# Patient Record
Sex: Male | Born: 1935 | Race: White | Hispanic: No | Marital: Married | Smoking: Never smoker
Health system: Southern US, Community
[De-identification: ages and names within clinical notes are randomized; demographics above are authoritative.]

## PROBLEM LIST (undated history)

## (undated) DIAGNOSIS — G43909 Migraine, unspecified, not intractable, without status migrainosus: Secondary | ICD-10-CM

## (undated) DIAGNOSIS — E079 Disorder of thyroid, unspecified: Secondary | ICD-10-CM

## (undated) DIAGNOSIS — Z9289 Personal history of other medical treatment: Secondary | ICD-10-CM

## (undated) DIAGNOSIS — E785 Hyperlipidemia, unspecified: Secondary | ICD-10-CM

## (undated) DIAGNOSIS — E119 Type 2 diabetes mellitus without complications: Secondary | ICD-10-CM

## (undated) DIAGNOSIS — I679 Cerebrovascular disease, unspecified: Secondary | ICD-10-CM

## (undated) DIAGNOSIS — I4510 Unspecified right bundle-branch block: Secondary | ICD-10-CM

## (undated) DIAGNOSIS — I2510 Atherosclerotic heart disease of native coronary artery without angina pectoris: Secondary | ICD-10-CM

## (undated) DIAGNOSIS — K922 Gastrointestinal hemorrhage, unspecified: Secondary | ICD-10-CM

## (undated) DIAGNOSIS — D696 Thrombocytopenia, unspecified: Secondary | ICD-10-CM

## (undated) DIAGNOSIS — N529 Male erectile dysfunction, unspecified: Secondary | ICD-10-CM

## (undated) DIAGNOSIS — I10 Essential (primary) hypertension: Secondary | ICD-10-CM

## (undated) DIAGNOSIS — T7840XA Allergy, unspecified, initial encounter: Secondary | ICD-10-CM

## (undated) DIAGNOSIS — M1990 Unspecified osteoarthritis, unspecified site: Secondary | ICD-10-CM

## (undated) DIAGNOSIS — N400 Enlarged prostate without lower urinary tract symptoms: Secondary | ICD-10-CM

## (undated) DIAGNOSIS — R0602 Shortness of breath: Secondary | ICD-10-CM

## (undated) DIAGNOSIS — K219 Gastro-esophageal reflux disease without esophagitis: Secondary | ICD-10-CM

## (undated) DIAGNOSIS — Z87442 Personal history of urinary calculi: Secondary | ICD-10-CM

## (undated) DIAGNOSIS — K5790 Diverticulosis of intestine, part unspecified, without perforation or abscess without bleeding: Secondary | ICD-10-CM

## (undated) HISTORY — DX: Disorder of thyroid, unspecified: E07.9

## (undated) HISTORY — DX: Diverticulosis of intestine, part unspecified, without perforation or abscess without bleeding: K57.90

## (undated) HISTORY — DX: Unspecified right bundle-branch block: I45.10

## (undated) HISTORY — DX: Allergy, unspecified, initial encounter: T78.40XA

## (undated) HISTORY — DX: Cerebrovascular disease, unspecified: I67.9

## (undated) HISTORY — DX: Male erectile dysfunction, unspecified: N52.9

## (undated) HISTORY — DX: Unspecified osteoarthritis, unspecified site: M19.90

## (undated) HISTORY — PX: HERNIA REPAIR: SHX51

## (undated) HISTORY — PX: PARAESOPHAGEAL HERNIA REPAIR: SHX2161

## (undated) HISTORY — DX: Thrombocytopenia, unspecified: D69.6

## (undated) HISTORY — PX: LAPAROSCOPIC NISSEN FUNDOPLICATION: SHX1932

## (undated) HISTORY — DX: Benign prostatic hyperplasia without lower urinary tract symptoms: N40.0

## (undated) HISTORY — DX: Type 2 diabetes mellitus without complications: E11.9

## (undated) HISTORY — DX: Hyperlipidemia, unspecified: E78.5

## (undated) HISTORY — DX: Atherosclerotic heart disease of native coronary artery without angina pectoris: I25.10

## (undated) HISTORY — DX: Migraine, unspecified, not intractable, without status migrainosus: G43.909

## (undated) HISTORY — DX: Gastrointestinal hemorrhage, unspecified: K92.2

## (undated) HISTORY — PX: CARDIAC CATHETERIZATION: SHX172

---

## 1996-06-15 HISTORY — PX: CORONARY ARTERY BYPASS GRAFT: SHX141

## 2001-04-29 ENCOUNTER — Encounter: Payer: Self-pay | Admitting: Orthopaedic Surgery

## 2001-04-29 ENCOUNTER — Ambulatory Visit (HOSPITAL_COMMUNITY): Admission: RE | Admit: 2001-04-29 | Discharge: 2001-04-29 | Payer: Self-pay | Admitting: Orthopaedic Surgery

## 2001-05-05 ENCOUNTER — Other Ambulatory Visit: Admission: RE | Admit: 2001-05-05 | Discharge: 2001-05-05 | Payer: Self-pay | Admitting: Dermatology

## 2001-07-28 ENCOUNTER — Other Ambulatory Visit: Admission: RE | Admit: 2001-07-28 | Discharge: 2001-07-28 | Payer: Self-pay | Admitting: Dermatology

## 2002-08-01 ENCOUNTER — Ambulatory Visit (HOSPITAL_COMMUNITY): Admission: RE | Admit: 2002-08-01 | Discharge: 2002-08-02 | Payer: Self-pay | Admitting: Cardiology

## 2002-09-18 ENCOUNTER — Encounter (HOSPITAL_COMMUNITY): Admission: RE | Admit: 2002-09-18 | Discharge: 2002-10-18 | Payer: Self-pay | Admitting: Cardiology

## 2003-08-06 ENCOUNTER — Ambulatory Visit (HOSPITAL_COMMUNITY): Admission: RE | Admit: 2003-08-06 | Discharge: 2003-08-06 | Payer: Self-pay | Admitting: Neurology

## 2004-03-24 ENCOUNTER — Other Ambulatory Visit: Admission: RE | Admit: 2004-03-24 | Discharge: 2004-03-24 | Payer: Self-pay | Admitting: Dermatology

## 2004-07-10 ENCOUNTER — Ambulatory Visit: Payer: Self-pay | Admitting: Cardiology

## 2004-07-14 ENCOUNTER — Ambulatory Visit: Payer: Self-pay | Admitting: Cardiology

## 2004-07-15 ENCOUNTER — Inpatient Hospital Stay (HOSPITAL_BASED_OUTPATIENT_CLINIC_OR_DEPARTMENT_OTHER): Admission: RE | Admit: 2004-07-15 | Discharge: 2004-07-15 | Payer: Self-pay | Admitting: Cardiology

## 2004-07-18 ENCOUNTER — Inpatient Hospital Stay (HOSPITAL_COMMUNITY): Admission: RE | Admit: 2004-07-18 | Discharge: 2004-07-19 | Payer: Self-pay | Admitting: Cardiology

## 2004-07-18 ENCOUNTER — Ambulatory Visit: Payer: Self-pay | Admitting: Cardiology

## 2004-08-04 ENCOUNTER — Ambulatory Visit: Payer: Self-pay | Admitting: Cardiology

## 2005-05-12 ENCOUNTER — Ambulatory Visit: Payer: Self-pay | Admitting: Cardiology

## 2006-01-08 ENCOUNTER — Ambulatory Visit: Payer: Self-pay | Admitting: Cardiology

## 2006-09-09 ENCOUNTER — Ambulatory Visit: Payer: Self-pay | Admitting: Cardiology

## 2006-09-22 ENCOUNTER — Ambulatory Visit: Payer: Self-pay | Admitting: Cardiology

## 2006-11-01 ENCOUNTER — Ambulatory Visit: Payer: Self-pay | Admitting: Cardiology

## 2007-11-17 ENCOUNTER — Ambulatory Visit: Payer: Self-pay | Admitting: Cardiology

## 2007-11-21 ENCOUNTER — Inpatient Hospital Stay (HOSPITAL_BASED_OUTPATIENT_CLINIC_OR_DEPARTMENT_OTHER): Admission: RE | Admit: 2007-11-21 | Discharge: 2007-11-21 | Payer: Self-pay | Admitting: Cardiology

## 2007-11-21 ENCOUNTER — Ambulatory Visit: Payer: Self-pay | Admitting: Cardiology

## 2008-03-15 DIAGNOSIS — K922 Gastrointestinal hemorrhage, unspecified: Secondary | ICD-10-CM

## 2008-03-15 HISTORY — DX: Gastrointestinal hemorrhage, unspecified: K92.2

## 2008-03-21 ENCOUNTER — Encounter: Payer: Self-pay | Admitting: Gastroenterology

## 2008-03-21 ENCOUNTER — Inpatient Hospital Stay (HOSPITAL_COMMUNITY): Admission: EM | Admit: 2008-03-21 | Discharge: 2008-03-23 | Payer: Self-pay | Admitting: Emergency Medicine

## 2008-03-21 ENCOUNTER — Ambulatory Visit: Payer: Self-pay | Admitting: Gastroenterology

## 2008-03-21 ENCOUNTER — Ambulatory Visit: Payer: Self-pay | Admitting: Cardiology

## 2008-03-28 ENCOUNTER — Inpatient Hospital Stay (HOSPITAL_COMMUNITY): Admission: AD | Admit: 2008-03-28 | Discharge: 2008-03-30 | Payer: Self-pay | Admitting: General Surgery

## 2008-03-28 ENCOUNTER — Ambulatory Visit: Payer: Self-pay | Admitting: Cardiology

## 2008-05-15 ENCOUNTER — Ambulatory Visit: Payer: Self-pay | Admitting: Cardiology

## 2008-06-22 ENCOUNTER — Ambulatory Visit: Payer: Self-pay | Admitting: Gastroenterology

## 2008-08-07 ENCOUNTER — Ambulatory Visit: Payer: Self-pay | Admitting: Gastroenterology

## 2008-08-10 ENCOUNTER — Encounter: Payer: Self-pay | Admitting: Gastroenterology

## 2008-10-23 ENCOUNTER — Encounter (INDEPENDENT_AMBULATORY_CARE_PROVIDER_SITE_OTHER): Payer: Self-pay | Admitting: *Deleted

## 2008-10-23 LAB — CONVERTED CEMR LAB
Albumin: 4.1 g/dL
Bilirubin, Direct: 0.2 mg/dL
Cholesterol: 106 mg/dL
LDL Cholesterol: 58 mg/dL
Total Protein: 7.1 g/dL
Triglycerides: 84 mg/dL

## 2009-01-16 DIAGNOSIS — I10 Essential (primary) hypertension: Secondary | ICD-10-CM | POA: Insufficient documentation

## 2009-01-16 DIAGNOSIS — N400 Enlarged prostate without lower urinary tract symptoms: Secondary | ICD-10-CM | POA: Insufficient documentation

## 2009-01-17 ENCOUNTER — Ambulatory Visit: Payer: Self-pay | Admitting: Gastroenterology

## 2009-04-09 ENCOUNTER — Encounter (INDEPENDENT_AMBULATORY_CARE_PROVIDER_SITE_OTHER): Payer: Self-pay | Admitting: *Deleted

## 2009-04-09 LAB — CONVERTED CEMR LAB
ALT: 30 units/L
Albumin: 4.4 g/dL
BUN: 17 mg/dL
Chloride: 104 meq/L
HCT: 47.3 %
HDL: 40 mg/dL
Hemoglobin: 15.6 g/dL
LDL Cholesterol: 56 mg/dL
Platelets: 104 10*3/uL
Total Protein: 7.7 g/dL
Triglycerides: 99 mg/dL
WBC: 4.6 10*3/uL

## 2009-05-08 ENCOUNTER — Ambulatory Visit: Payer: Self-pay | Admitting: Cardiology

## 2009-05-08 ENCOUNTER — Encounter (INDEPENDENT_AMBULATORY_CARE_PROVIDER_SITE_OTHER): Payer: Self-pay | Admitting: *Deleted

## 2009-05-08 LAB — CONVERTED CEMR LAB
Eosinophils Absolute: 0.2 10*3/uL (ref 0.0–0.7)
Eosinophils Relative: 3 % (ref 0–5)
MCHC: 33.3 g/dL (ref 30.0–36.0)
Monocytes Absolute: 0.7 10*3/uL (ref 0.1–1.0)
Monocytes Relative: 12 % (ref 3–12)
Neutro Abs: 2.3 10*3/uL (ref 1.7–7.7)
Platelets: 112 10*3/uL — ABNORMAL LOW (ref 150–400)
RBC: 5.19 M/uL (ref 4.22–5.81)

## 2009-05-10 ENCOUNTER — Encounter (INDEPENDENT_AMBULATORY_CARE_PROVIDER_SITE_OTHER): Payer: Self-pay | Admitting: *Deleted

## 2009-06-04 ENCOUNTER — Encounter: Payer: Self-pay | Admitting: Cardiology

## 2009-06-04 LAB — CONVERTED CEMR LAB
Basophils Relative: 1 % (ref 0–1)
Eosinophils Absolute: 0.2 10*3/uL (ref 0.0–0.7)
Hemoglobin: 15.5 g/dL (ref 13.0–17.0)
Lymphs Abs: 1.9 10*3/uL (ref 0.7–4.0)
Monocytes Absolute: 0.5 10*3/uL (ref 0.1–1.0)
Monocytes Relative: 11 % (ref 3–12)
RBC: 5.06 M/uL (ref 4.22–5.81)
RDW: 13.2 % (ref 11.5–15.5)
WBC: 4.8 10*3/uL (ref 4.0–10.5)

## 2009-06-06 ENCOUNTER — Telehealth: Payer: Self-pay | Admitting: Cardiology

## 2009-09-04 ENCOUNTER — Encounter: Payer: Self-pay | Admitting: Cardiology

## 2010-01-06 ENCOUNTER — Encounter (INDEPENDENT_AMBULATORY_CARE_PROVIDER_SITE_OTHER): Payer: Self-pay | Admitting: *Deleted

## 2010-01-06 LAB — CONVERTED CEMR LAB
ALT: 30 units/L
Albumin: 4.4 g/dL
Bilirubin, Direct: 0.3 mg/dL
HDL: 36 mg/dL
Total Protein: 7.3 g/dL
Triglycerides: 91 mg/dL

## 2010-04-07 ENCOUNTER — Encounter: Payer: Self-pay | Admitting: Orthopedic Surgery

## 2010-04-07 ENCOUNTER — Ambulatory Visit (HOSPITAL_COMMUNITY): Admission: RE | Admit: 2010-04-07 | Discharge: 2010-04-07 | Payer: Self-pay | Admitting: Family Medicine

## 2010-04-29 ENCOUNTER — Encounter (INDEPENDENT_AMBULATORY_CARE_PROVIDER_SITE_OTHER): Payer: Self-pay | Admitting: *Deleted

## 2010-04-30 ENCOUNTER — Ambulatory Visit: Payer: Self-pay | Admitting: Cardiology

## 2010-05-13 ENCOUNTER — Ambulatory Visit: Payer: Self-pay | Admitting: Orthopedic Surgery

## 2010-05-13 DIAGNOSIS — M629 Disorder of muscle, unspecified: Secondary | ICD-10-CM | POA: Insufficient documentation

## 2010-05-13 DIAGNOSIS — S83289A Other tear of lateral meniscus, current injury, unspecified knee, initial encounter: Secondary | ICD-10-CM | POA: Insufficient documentation

## 2010-05-14 ENCOUNTER — Encounter: Payer: Self-pay | Admitting: Orthopedic Surgery

## 2010-05-20 ENCOUNTER — Ambulatory Visit (HOSPITAL_COMMUNITY)
Admission: RE | Admit: 2010-05-20 | Discharge: 2010-05-20 | Payer: Self-pay | Source: Home / Self Care | Admitting: Orthopedic Surgery

## 2010-05-27 ENCOUNTER — Ambulatory Visit: Payer: Self-pay | Admitting: Orthopedic Surgery

## 2010-05-30 ENCOUNTER — Encounter: Payer: Self-pay | Admitting: Cardiology

## 2010-07-15 NOTE — Miscellaneous (Signed)
Summary: ntg refill  Clinical Lists Changes  Medications: Added new medication of NITROSTAT 0.4 MG SUBL (NITROGLYCERIN) 1 tablet under tongue at onset of chest pain; you may repeat every 5 minutes for up to 3 doses. - Signed Rx of NITROSTAT 0.4 MG SUBL (NITROGLYCERIN) 1 tablet under tongue at onset of chest pain; you may repeat every 5 minutes for up to 3 doses.;  #25 x 3;  Signed;  Entered by: Teressa Lower RN;  Authorized by: Kathlen Brunswick, MD, [facility];  Method used: Electronically to [facility] [address]*, [address] [place], [place], [place]  [address], Ph: [phone], Fax: [phone]    Prescriptions: NITROSTAT 0.4 MG SUBL (NITROGLYCERIN) 1 tablet under tongue at onset of chest pain; you may repeat every 5 minutes for up to 3 doses.  #25 x 3   Entered by:   Teressa Lower RN   Authorized by:   Kathlen Brunswick, MD, [facility]   Signed by:   Teressa Lower RN on 09/04/2009   Method used:   Electronically to        [facility]  [address] (retail)       [address]       [place], [place]  [address]       Ph: [phone]       Fax: [phone]   RxID:   [id number]

## 2010-07-15 NOTE — Miscellaneous (Signed)
Summary: labs lipids,liver,01/06/2010  Clinical Lists Changes  Observations: Added new observation of ALBUMIN: 4.4 g/dL (16/03/9603 5:40) Added new observation of PROTEIN, TOT: 7.3 g/dL (98/04/9146 8:29) Added new observation of SGPT (ALT): 30 units/L (01/06/2010 9:54) Added new observation of SGOT (AST): 25 units/L (01/06/2010 9:54) Added new observation of ALK PHOS: 57 units/L (01/06/2010 9:54) Added new observation of BILI DIRECT: 0.3 mg/dL (56/21/3086 5:78) Added new observation of LDL: 50 mg/dL (46/96/2952 8:41) Added new observation of HDL: 36 mg/dL (32/44/0102 7:25) Added new observation of TRIGLYC TOT: 91 mg/dL (36/64/4034 7:42) Added new observation of CHOLESTEROL: 104 mg/dL (59/56/3875 6:43)

## 2010-07-15 NOTE — Assessment & Plan Note (Signed)
Summary: 1 yr f/u per checkout on 05/08/09/tg  Medications Added NIASPAN 500 MG CR-TABS (NIACIN (ANTIHYPERLIPIDEMIC)) 500 am 1000 pm OMEGA 3 340 MG CPDR (OMEGA-3 FATTY ACIDS) take 1 tab daily LABETALOL HCL 200 MG TABS (LABETALOL HCL) Take one tablet by mouth twice a day      Allergies Added: ! * ZITHROMYCIN  Visit Type:  Follow-up Primary Provider:  Dr. Oval Linsey   History of Present Illness: [name] returns to the office as scheduled for continued assessment and treatment of coronary disease and cardiovascular risk factors.  He discontinued clopidogrel at my suggestion a year ago, and has had no subsequent apparent adverse consequences.  Control of blood pressure and hyperlipidemia have been excellent.  He walks daily and performs household tasks without any cardiopulmonary symptoms.  He has noted recent weight loss without anorexia and without an effort to limit caloric intake.  Current Medications (verified): 1)  Niaspan 500 Mg Cr-Tabs (Niacin (Antihyperlipidemic)) .... 500 Am 1000 Pm 2)  Uroxatral 10 Mg Xr24h-Tab (Alfuzosin Hcl) .... Once Daily 3)  Lipitor 20 Mg Tabs (Atorvastatin Calcium) .... At Bedtime 4)  Omega 3 340 Mg Cpdr (Omega-3 Fatty Acids) .... Take 1 Tab Daily 5)  Aspirin 81 Mg Tabs (Aspirin) .... Once Daily 6)  Omeprazole 20 Mg Cpdr (Omeprazole) .... One By Mouth Bid 7)  Nitrostat 0.4 Mg Subl (Nitroglycerin) .Marland Kitchen.. 1 Tablet Under Tongue At Onset of Chest Pain; You May Repeat Every 5 Minutes For Up To 3 Doses. 8)  Labetalol Hcl 200 Mg Tabs (Labetalol Hcl) .... Take One Tablet By Mouth Twice A Day  Allergies (verified): 1)  ! * Emycin 2)  ! Biaxin 3)  ! Codeine 4)  ! * Zithromycin  Comments:  Nurse/Medical Assistant: reviewed meds from previous ov with patient he has stopped the multi vit  and is taking omega 3,6,9 daily the only one in the computer was omega 3   Past History:  PMH, FH, and Social History reviewed and updated.  Review of  Systems  The patient denies weight loss, weight gain, chest pain, syncope, dyspnea on exertion, peripheral edema, prolonged cough, headaches, and abdominal pain.    Vital Signs:  Patient profile:   75 year old male Weight:      199 pounds BMI:     27.09 O2 Sat:      92 % on Room air Pulse rate:   62 / minute BP sitting:   128 / 75  (right arm)  Vitals Entered By: Dreama Saa, CNA (April 30, 2010 1:35 PM)  O2 Flow:  Room air  Physical Exam  General:  Proportionate weight and height; well developed; no acute distress; Weight-199, 8 pounds less than one year ago Neck-No JVD; no carotid bruits: Lungs-No tachypnea, no rales; no rhonchi; no wheezes: Cardiovascular-normal PMI; normal S1 and S2; minimal basilar systolic murmur Abdomen-BS normal; soft and non-tender without masses or organomegaly:  Musculoskeletal-No deformities, no cyanosis or clubbing: Neurologic-Normal cranial nerves; symmetric strength and tone:  Skin-Warm, no significant lesions: Extremities-Nl distal pulses; no edema:     Impression & Recommendations:  Problem # 1:  ATHEROSCLEROTIC CARDIOVASCULAR DISEASES/P- CABG SURGERY-1998 (ICD-429.2) Patient remains active with no symptoms to suggest recurrent myocardial ischemia or progression of coronary disease.  We discussed the advisability of a routine stress test and decided against proceeding with such a study.  Problem # 2:  HYPERLIPIDEMIA (ICD-272.4) Control of hyperlipidemia is excellent with current multidrug regimen, which will be continued.  His updated medication  list for this problem includes:    Niaspan 500 Mg Cr-tabs (Niacin (antihyperlipidemic)) .Marland KitchenMarland KitchenMarland KitchenMarland Kitchen 500 am 1000 pm    Lipitor 20 Mg Tabs (Atorvastatin calcium) .Marland Kitchen... At bedtime    Omega fatty acid one capsule b.i.d.  CHOL: 104 (01/06/2010)   LDL: 50 (01/06/2010)   HDL: 36 (01/06/2010)   TG: 91 (01/06/2010)  Problem # 3:  HYPERTENSION (ICD-401.9) Blood pressure control is superb with a fairly  modest medical regime.  Patient has excellent insurance that pays all but $3 for most of his prescriptions; however, a copayment of $25 per month for Bystolic has been assessed.  I suggested substituting labetalol 200 mg b.i.d. to reduce his cost with no sacrifice in efficacy.  He would like to discuss this issue with Dr. Janna Arch before accepting my suggestion.   BP today: 128/75 Prior BP: 119/68 (05/08/2009)  Labs Reviewed: K+: 4.2 (04/09/2009) Creat: : 0.93 (04/09/2009)   Chol: 104 (01/06/2010)   HDL: 36 (01/06/2010)   LDL: 50 (01/06/2010)   TG: 91 (01/06/2010)  Problem # 4:  THROMBOCYTOPENIA (ICD-287.5) Repeat CBC last year did in fact verify the presence of mild thrombocytopenia.  Mild idiopathic thrombocytopenic purpura and perhaps unrecognized liver disease are the most likely diagnoses.  While he probably does not require therapy for this problem, a diagnostic workup by Dr. Mariel Sleet would be reassuring.  Patient Instructions: 1)  Your physician recommends that you schedule a follow-up appointment in: 1 year 2)  Your physician has recommended you make the following change in your medication: stop bystolic and begin labetalol 200mg  two times a day Prescriptions: LABETALOL HCL 200 MG TABS (LABETALOL HCL) Take one tablet by mouth twice a day  #180 x 3   Entered by:   Teressa Lower RN   Authorized by:   Kathlen Brunswick, MD, [facility]   Signed by:   Teressa Lower RN on 04/30/2010   Method used:   Electronically to        [facility]* (mail-order)             , [place]         Ph: [phone]       Fax: [phone]   RxID:   [id number]

## 2010-07-15 NOTE — Assessment & Plan Note (Signed)
Summary: EVAL/TREAT LT KNEE/XR'S [facility] 04/07/10/REF [place]/MEDICARE,TR...   Visit Type:  Initial   Referring Provider:  Dr. Janna Arch Primary Provider:  Dr. Oval Linsey  CC:  LEFT knee pain x9 months.  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  The pain is constant, rated 8/10 came on gradually without history of trauma but is associated with giving way locking pain with twisting.  Standing makes it worse.  Review of systems unexpected weight loss double vision or eye pain redness.  No chest pain or shortness of breath.  History of blood in the stool heartburn, nausea, vomiting, constipation, diarrhea.  Joint pain stiffness and muscle pain  Unsteady gait and dizziness  Denies genitourinary, skin, psychiatric, endocrine ALLERGY or hematologic problems  Current medications include aspirin, BYSTOLIC, Lipitor, omeprazole, Omega 36 and 9, Niaspan, UROXATRAL.  Family history of heart disease arthritis lung disease and cancer  Social history married, retired, X. 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  ALLERGIES to codeine which causes nausea, erythromycin, azithromycin and Biaxin.  Allergies: 1)  ! * Emycin 2)  ! Biaxin 3)  ! Codeine 4)  ! * Zithromycin  Physical Exam  Additional Exam:  GEN: well developed, well nourished, normal grooming and hygiene, no deformity and normal body habitus.   CDV: pulses are normal, no edema, no erythema. no tenderness  Lymph: normal lymph nodes   Skin: no rashes, skin lesions or open sores   NEURO: normal coordination, reflexes, sensation.   Psyche: awake, alert and oriented. Mood normal   Gait: normal  Inspection there is no swelling in the knee, there was lateral joint line tenderness and tenderness over the iliotibial band which was  also swollen distally near the knee.  There was also normal range of motion good motor function and no instability   The upper extremities have normal appearance, ROM, strength and stability.       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 The report and the films have been reviewed.  his knees look really good especially for 75 years old almost no arthritic change  Probable lateral meniscal tear with a differential diagnosis of iliotibial band friction syndrome recommend MRIup with result  Orders: New Patient Level III (96295)  Patient Instructions: 1)  MRI left knee return with results  2)  in the meantime apply ice on the knee where its hurting 20 min 2 x a day    Orders Added: 1)  New Patient Level III [28413]

## 2010-07-15 NOTE — Letter (Signed)
Summary: History form  History form   Imported By: Jacklynn Ganong 05/15/2010 08:48:57  _____________________________________________________________________  External Attachment:    Type:   Image     Comment:   External Document

## 2010-07-17 NOTE — Miscellaneous (Signed)
  Clinical Lists Changes  Medications: Changed medication from LABETALOL HCL 200 MG TABS (LABETALOL HCL) Take one tablet by mouth twice a day to LABETALOL HCL 100 MG TABS (LABETALOL HCL) take 2 tablets by mouth two times a day - Signed Rx of LABETALOL HCL 100 MG TABS (LABETALOL HCL) take 2 tablets by mouth two times a day;  #360 x 3;  Signed;  Entered by: Teressa Lower RN;  Authorized by: Kathlen Brunswick, MD, [facility];  Method used: Faxed to [facility], , , [place]  , Ph: [phone], Fax: [phone]    Prescriptions: LABETALOL HCL 100 MG TABS (LABETALOL HCL) take 2 tablets by mouth two times a day  #360 x 3   Entered by:   Teressa Lower RN   Authorized by:   Kathlen Brunswick, MD, [facility]   Signed by:   Teressa Lower RN on 05/30/2010   Method used:   Faxed to ...       [facility] (mail-order)             , [place]         Ph: [phone]       Fax: [phone]   RxID:   [id number]

## 2010-07-17 NOTE — Assessment & Plan Note (Signed)
Summary: MRI results fron [facility]/fr[facility]   Referring Onesty Clair:  Dr. Janna Arch Primary Kerryn Tennant:  Dr. 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  The pain is constant, rated 8/10 came on gradually without history of trauma but is associated with giving way locking pain with twisting.  Standing makes it worse.  Pain is worse today has pain with walking, pain in the am.  No injections in the left knee.  Percocet makes him sick.  Dr. Janna Arch is going to order MRI of the L spine.  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  Negative for meniscal tear. 2.  Mild patellofemoral and medial compartment degenerative disease. 3.  Small Baker's cyst.     Allergies: 1)  ! * Emycin 2)  ! Biaxin 3)  ! Codeine 4)  ! * Zithromycin  Review of Systems       Back pain history of back surgery. 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  His pain is lateral, and we we evaluated that with exam and history and he has pain over the iliotibial band as it crosses the joint.  He agreed to injection, LEFT knee iliotibial band. Verbal consent was obtained. The knee was prepped with alcohol and ethyl chloride. 1 cc of depomedrol 40mg /cc and 4 cc of lidocaine 1% was injected. there were no complications.  Orders: Est. Patient Level III (29528) Joint Aspirate / Injection, Large (20610) Depo- Medrol 40mg  (J1030)  Patient Instructions: 1)  You have  received an injection of cortisone today. You may experience increased pain at the injection site. Apply ice pack to the area for 20 minutes every 2 hours and take 2 xtra strength tylenol every 8 hours. This increased pain will usually resolve in 24 hours. The injection will take effect in 3-10 days.  2)  I would give it 3 weeks before we say it doesnt work    Orders Added: 1)  Est. Patient Level III [41324] 2)  Joint Aspirate / Injection, Large [20610] 3)  Depo- Medrol 40mg  [J1030]

## 2010-08-01 ENCOUNTER — Other Ambulatory Visit: Payer: Self-pay | Admitting: General Surgery

## 2010-08-01 ENCOUNTER — Encounter (HOSPITAL_COMMUNITY): Payer: Medicare Other

## 2010-08-01 DIAGNOSIS — Z01810 Encounter for preprocedural cardiovascular examination: Secondary | ICD-10-CM | POA: Insufficient documentation

## 2010-08-01 DIAGNOSIS — Z01812 Encounter for preprocedural laboratory examination: Secondary | ICD-10-CM | POA: Insufficient documentation

## 2010-08-01 LAB — SURGICAL PCR SCREEN: Staphylococcus aureus: POSITIVE — AB

## 2010-08-01 LAB — BASIC METABOLIC PANEL
CO2: 27 mEq/L (ref 19–32)
Calcium: 9.3 mg/dL (ref 8.4–10.5)
GFR calc Af Amer: >60 mL/min (ref >60)

## 2010-08-01 LAB — CBC
HCT: 44.1 % (ref 39.0–52.0)
Hemoglobin: 14.3 g/dL (ref 13.0–17.0)
MCHC: 32.4 g/dL (ref 30.0–36.0)

## 2010-08-06 ENCOUNTER — Ambulatory Visit (HOSPITAL_COMMUNITY)
Admission: RE | Admit: 2010-08-06 | Discharge: 2010-08-06 | Disposition: A | Discharge status: Home / Self Care | Payer: Medicare Other | Source: Ambulatory Visit | Attending: General Surgery | Admitting: General Surgery

## 2010-08-06 DIAGNOSIS — K4090 Unilateral inguinal hernia, without obstruction or gangrene, not specified as recurrent: Secondary | ICD-10-CM | POA: Insufficient documentation

## 2010-08-06 DIAGNOSIS — Z951 Presence of aortocoronary bypass graft: Secondary | ICD-10-CM | POA: Insufficient documentation

## 2010-08-06 DIAGNOSIS — Z7982 Long term (current) use of aspirin: Secondary | ICD-10-CM | POA: Insufficient documentation

## 2010-08-06 DIAGNOSIS — Z79899 Other long term (current) drug therapy: Secondary | ICD-10-CM | POA: Insufficient documentation

## 2010-08-06 NOTE — H&P (Signed)
  NAME:  TAVON, MAGNUSSEN NO.:  [id number]  MEDICAL RECORD NO.:  [id number]           PATIENT TYPE:  LOCATION:                                 FACILITY:  PHYSICIAN:  Dalia Heading, M.D.  DATE OF BIRTH:  [birth year]  DATE OF ADMISSION: DATE OF DISCHARGE:  LH                             HISTORY & PHYSICAL   CHIEF COMPLAINT:  Right inguinal hernia.  HISTORY OF PRESENT ILLNESS:  The patient is a 75 year old white male who presents with right inguinal hernia.  He has had it for many years, but it has recently increased in size and is causing him discomfort.  PAST MEDICAL HISTORY:  Hypertension, idiopathic thrombocytopenia, high cholesterol levels, and reflux disease.  PAST SURGICAL HISTORY:  Laparoscopic fundoplication with repair of diaphragmatic hernia, CABG, and stent placement.  CURRENT MEDICATIONS: 1. Bystolic. 2. Lipitor. 3. Niaspan. 4. Omeprazole. 5. Uroxatral.  ALLERGIES:  CODEINE, ZITHROMAX.  REVIEW OF SYSTEMS:  The patient denies drinking or smoking.  Denies any other cardiopulmonary difficulties or bleeding disorders.  FAMILY MEDICAL HISTORY:  Noncontributory.  PHYSICAL EXAMINATION:  GENERAL:  The patient is a well-developed, well- nourished white male in no acute distress. HEENT:  Unremarkable. LUNGS:  Clear to auscultation with equal breath sounds bilaterally. HEART:  Regular rate and rhythm without S3, S4, or murmurs. ABDOMEN:  Soft, nontender, and nondistended.  He has bilateral inguinal hernias. GENITOURINARY:  Within normal limits.  IMPRESSION:  Symptomatic right inguinal hernia.  PLAN:  The patient is scheduled for right inguinal herniorrhaphy on August 06, 2010.  Risks and benefits of the procedure including bleeding, infection, pain, and the possibility of recurrence of the hernia were fully explained to the patient, he gave informed consent. He is to stop his baby aspirin.  A platelet count will be checked in preop  period.     Dalia Heading, M.D.     MAJ/MEDQ  D:  07/31/2010  T:  08/01/2010  Job:  [id number]  cc:   Delbert Harness, MD  Electronically Signed by Franky Macho M.D. on 08/06/2010 07:32:43 AM

## 2010-08-10 NOTE — Op Note (Signed)
  NAME:  Jorge Bishop, Jorge Bishop NO.:  [id number]  MEDICAL RECORD NO.:  [id number]           PATIENT TYPE:  O  LOCATION:  [facility]                          FACILITY:  [facility]  PHYSICIAN:  Dalia Heading, M.D.  DATE OF BIRTH:  [birth year]  DATE OF PROCEDURE:  08/06/2010 DATE OF DISCHARGE:                              OPERATIVE REPORT   PREOPERATIVE DIAGNOSIS:  Right inguinal hernia.  POSTOPERATIVE DIAGNOSIS:  Right inguinal hernia.  PROCEDURE:  Right inguinal herniorrhaphy.  SURGEON:  Dalia Heading, M.D.  ANESTHESIA:  Spinal.  INDICATIONS:  The patient is a 75 year old white male who presents with a symptomatic right inguinal hernia.  The risks and benefits of the procedure including bleeding, infection, pain, and the possibly of recurrence of the hernia were fully explained to the patient, he gave informed consent.  PROCEDURE NOTE:  The patient was placed in the supine position after spinal anesthesia was administered.  The right groin region was prepped and draped using the usual sterile technique, with Betadine.  Surgical site confirmation was performed.  A transverse incision was made over the right inguinal region down to the external oblique aponeurosis.  The aponeurosis was incised to the external ring.  A Penrose drain was placed around the spermatic cord. The vase deferens was noted within the spermatic cord.  The ilioinguinal nerve was identified and kept superiorly from the operative field.  The patient had a direct hernia.  This was incised at its base and inverted. A large-size Bard PerFix plug was then placed into this region.  An onlay patch was then placed along the floor in the inguinal canal and secured superiorly to the conjoined tendon and inferiorly to the shelving edge of Poupart's ligament using 2-0 Novafil interrupted sutures.  The internal ring was re-created using a 2-0 Novafil interrupted suture.  An On-Q pain pump catheter was then  inserted percutaneously underneath the external oblique aponeurosis.  The aponeurosis was closed using a 2-0 Vicryl running suture.  The subcutaneous layer was reapproximated using a 3-0 Vicryl interrupted suture.  The skin was closed using 4-0 Vicryl subcuticular suture. Sensorcaine 0.5% was instilled into the surrounding wound.  Dermabond was then applied.  All tape and needle counts were correct at the end of the procedure. The patient was transferred to the PACU in stable condition.  COMPLICATIONS:  None.  SPECIMEN:  None.  ESTIMATED BLOOD LOSS:  Minimal.     Dalia Heading, M.D.     MAJ/MEDQ  D:  08/06/2010  T:  08/06/2010  Job:  [id number]  cc:   Delbert Harness, MD  Electronically Signed by Franky Macho M.D. on 08/10/2010 10:00:53 AM

## 2010-08-15 ENCOUNTER — Encounter: Payer: Self-pay | Admitting: Orthopedic Surgery

## 2010-09-01 ENCOUNTER — Encounter: Payer: Self-pay | Admitting: Orthopedic Surgery

## 2010-09-02 ENCOUNTER — Ambulatory Visit (INDEPENDENT_AMBULATORY_CARE_PROVIDER_SITE_OTHER): Payer: Medicare Other | Admitting: Orthopedic Surgery

## 2010-09-02 ENCOUNTER — Ambulatory Visit: Payer: Self-pay | Admitting: Orthopedic Surgery

## 2010-09-02 DIAGNOSIS — IMO0002 Reserved for concepts with insufficient information to code with codable children: Secondary | ICD-10-CM

## 2010-09-02 DIAGNOSIS — M1710 Unilateral primary osteoarthritis, unspecified knee: Secondary | ICD-10-CM

## 2010-09-02 DIAGNOSIS — M25569 Pain in unspecified knee: Secondary | ICD-10-CM

## 2010-09-02 DIAGNOSIS — M25562 Pain in left knee: Secondary | ICD-10-CM

## 2010-09-02 DIAGNOSIS — M179 Osteoarthritis of knee, unspecified: Secondary | ICD-10-CM

## 2010-09-02 NOTE — Discharge Summary (Signed)
Injection LEFT knee.  Consent was obtained.  LEFT knee was injected with Depo-Medrol 40 mg plus lidocaine 1% 4 cc.  Knee was prepped with alcohol and anesthetized with ethyl chloride.  The injection was tolerated without complication.

## 2010-09-02 NOTE — Patient Instructions (Signed)
Apply ice as needed   If you have pain from the injection, apply ice and take 1000 mg tylenol three times a day   Should resolve in 24 hrs

## 2010-09-02 NOTE — Progress Notes (Signed)
General: The patient is normally developed, with normal grooming and hygiene. There are no gross deformities. The body habitus is normal   CDV: The pulse and perfusion of the extremities are normal   Skin: There are no rashes, ulcers or cafe-au-lait spot   Psyche: The patient is alert, awake and oriented.  Mood is normal   The coordination and balance are normal.  Sensation is normal.  Musculoskeletal   Left knee exam   No joint effusion   Range of motion is 110 degrees   The knee is stable

## 2010-10-28 NOTE — Assessment & Plan Note (Signed)
NAME:  Jorge Bishop, STROSCHEIN                  CHART#:  [id number]   DATE:  06/22/2008                       DOB:  18-Jun-1935   REFERRING Carrington Mullenax:  Melvyn Novas, MD   PROBLEM LIST:  1. Paraesophageal hernia repaired in October of 2009.  2. Helicobacter pylori gastritis in October of 2009.  3. Coronary artery disease.  4. Right bundle branch block.  5. Hypertension.  6. Benign prostatic hypertrophy.  7. Hyperlipidemia.  8. History of laparoscopic Nissen fundoplication in 1997 and revision      in 2009.  9. CABG (coronary artery bypass graft) in 1998.  10.Multiple coronary artery stents.   SUBJECTIVE:  Jorge Bishop is a 75 year old male who presents as a return  patient visit.  He was last seen as an inpatient.  He had a  paraesophageal hernia removed and a Nissen fundoplication performed.  He  was treated initially with his Helicobacter pylori gastritis using  Biaxin.  It caused his throat to swell and he was switched to  amoxicillin and Flagyl.  He now complains of a constant full feeling  which he described as indigestion.  Since the surgery he complains of  constipation.  His bowel movements are hard.  He is taking his  omeprazole twice a day.  He denies eating any red meat and his diet is  salt free.  He does eat a lot of fruit.  He drinks very little water and  few sodas.  He does eat fruit.  He still can feel food going down.  He  denies any nausea, vomiting, black stool or blood in the stool.   MEDICATIONS:  1. Niaspan.  2. Uroxatral.  3. Bystolic.  4. Lipitor.  5. Omega 3 b.i.d.  6. Aspirin.  7. Multivitamin.  8. Omeprazole twice a day.   OBJECTIVE:  VITAL SIGNS:  Weight 213 pounds (up 8 pounds since October  of 2009), height 6 feet, temperature 97.9, blood pressure 110/70, pulse  56.  GENERAL:  He is in no apparent distress.  Alert and oriented x4.  HEENT:  Is atraumatic.  Pupils are equal and reactive to light.  Mouth -  no oral lesions.  Posterior pharynx  without erythema or exudate.  NECK:  Full range of motion.  No lymphadenopathy.  LUNGS:  Clear to  auscultation bilaterally.  CARDIOVASCULAR:  Regular rhythm.  No murmur.  Normal S1 and S2.ABDOMEN:  Bowel sounds are present.  Soft, nontender,  nondistended.  No rebound or guarding.  EXTREMITIES:  No cyanosis or  edema.   ASSESSMENT:  Jorge Bishop is a 75 year old male with a feeling of fullness  that is associated with constipation.  The differential diagnosis  includes small bowel bacterial overgrowth or gas bloat syndrome  following Nissen fundoplication due to delayed gastric emptying.  Thank  you for allowing me to see Mr. Dearmas in consultation.  My  recommendations follow.   RECOMMENDATIONS:  1. Mr. Ardolino was given a handout on the management of his constipation      in writing.  He is asked to drink 6-8 cups of water daily.  2. He should follow a high-fiber diet.  He is warned that items may      cause bloating and if so avoid them.  3. He is to add FiberSure or an equivalent two  times a day to achieve      one bowel movement every 2-3 days.  4. He is to add Milk of Magnesia or MiraLax once or twice a day to his      regimen if he is not having a satisfactory bowel movement.  5. He will follow up in 6 weeks.  He declined a prescription for      Amitiza at this time.  If his bloating persists then could consider      an Helicobacter pylori antigen test, hydrogen breath test or a      gastric emptying study.       Kassie Mends, M.D.  Electronically Signed     SM/MEDQ  D:  06/22/2008  T:  06/22/2008  Job:  [id number]   cc:   Melvyn Novas, MD

## 2010-10-28 NOTE — Consult Note (Signed)
NAME:  RAMARI, BRAY NO.:  [id number]   MEDICAL RECORD NO.:  [id number]          PATIENT TYPE:  INP   LOCATION:  A325                          FACILITY:  [facility]   PHYSICIAN:  Kassie Mends, M.D.      DATE OF BIRTH:  28-Dec-1935   DATE OF CONSULTATION:  03/21/2008  DATE OF DISCHARGE:                                 CONSULTATION   REASON FOR CONSULTATION:  GI bleed.   REQUESTING PHYSICIAN:  Melvyn Novas, M.D.   HISTORY OF PRESENT ILLNESS:  Patient is a 75 year old Caucasian  gentleman with acute onset of black, tarry stools which began yesterday  morning.  He had a total of six episodes.  After several black stools,  he then developed nausea and vomiting.  He describes coffee-ground  emesis.  He denies any abdominal pain.  He states that he passed out at  home.  He was evaluated in the emergency department.  He was noted to  have a hemoglobin of 12.2.  His INR was 1.1.  His BUN 63, creatinine  0.92.  White count 8600.  This morning, his hemoglobin is stable at  12.3.  White count is 12,100.   He denies any heartburn, dysphagia, odynophagia, constipation, or  diarrhea.  He had anti-reflux surgery in the remote past.  He had a  flexible sigmoidoscopy back in 1995 but no prior complete colonoscopy.  He did have early diverticulosis.   MEDICATIONS:  1. Lipitor 20 mg daily.  2. Nitrostat 0.4 mg p.r.n.  3. Uroxatral 10 mg daily.  4. Bystolic 5 mg daily.  5. Aspirin 325 mg daily.  6. Plavix 75 mg daily.  He has been on this since 1998.  7. Isosorbide mononitrate 30 mg daily.   He denies any other NSAIDs use.   ALLERGIES:  CODEINE, ERYTHROMYCIN.   PAST MEDICAL HISTORY:  1. CAD, status post CABG in 1998, followed by stents in 2004 and 2006.  2. Hyperlipidemia.  3. Degenerative joint disease.  4. History of migraines.  5. History of vertigo.  6. Chronic right bundle branch block.  7. History of diverticulosis.  8. Antireflux surgery.  9. Flexible  sigmoidoscopy, as above.   FAMILY HISTORY:  Father deceased in his 56s due to MI.  Son had his  first MI at age 59.  Mother had peptic ulcer disease.  No family history  of colorectal cancer.   SOCIAL HISTORY:  He is married.  He has a daughter and a son.  He is  retired from [employer].  He also worked at the [employer].  He has  never been a smoker.  No alcohol use.   REVIEW OF SYSTEMS:  See HPI for GI.  CONSTITUTIONAL:  He denies any  weight loss.  CARDIOPULMONARY:  He denies any chest pain, shortness of  breath, palpitations, cough, dyspnea on exertion.  GENITOURINARY:  He  denies any dysuria or hematuria.   PHYSICAL EXAMINATION:  Temp 99.4, pulse 85, respirations 20, blood  pressure 112/68.  O2 sats 98% on room air.  Weight 93.3 kg.  GENERAL:  A pleasant, well-developed and well-nourished elderly  Caucasian gentleman in no acute distress.  SKIN:  Warm and dry.  No jaundice.  HEENT:  Sclerae are anicteric.  Oropharyngeal mucosa is moist and pink.  No lymphadenopathy.  CHEST:  Lungs are clear to auscultation.  CARDIAC:  Regular rate and rhythm.  No murmurs, rubs or gallops.  ABDOMEN:  Positive bowel sounds.  Abdomen is soft, nontender and  nondistended.  No organomegaly or masses.  No rebound or guarding.  No  abdominal bruits or hernias.  LOWER EXTREMITIES:  No edema.   LABS:  White count 12,100, hemoglobin 12.3, platelets 138,000.  Sodium  139, potassium 4, BUN 63, creatinine 0.92, glucose 172.  Total bilirubin  1.2, alkaline phosphatase 37, AST 24, ALT 28, albumin 3.5.   IMPRESSION:  Patient is a 75 year old gentleman with acute upper  gastrointestinal bleed.  Differential includes peptic ulcer disease,  arteriovenous malformations, less likely malignancy.  Note that he does  have thrombocytopenia on admission.  This needs to be rechecked.  He  gives no indications to suspect the possibility of chronic liver disease  or esophageal varices.  He has never had a complete  colonoscopy  according to the records.  He needs to have one for screening purposes  at some point in the near future.   RECOMMENDATIONS:  1. EGD.  2. Continue PPI therapy.  3. Will recheck.  4. CBC tomorrow along with platelet count.  5. Colonoscopy at a later date.   I would like to thank Dr. Janna Arch for allowing [place] to take part in the  care of this patient.      Tana Coast, P.A.      Kassie Mends, M.D.  Electronically Signed    LL/MEDQ  D:  03/21/2008  T:  03/21/2008  Job:  [id number]   cc:   Tonna Corner  Fax: [phone]   [name], MD, [facility]  [address]  [address]  [place]  [place] [address]

## 2010-10-28 NOTE — Cardiovascular Report (Signed)
NAME:  Jorge Bishop, LYNAM NO.:  [id number]   MEDICAL RECORD NO.:  [id number]          PATIENT TYPE:  OIB   LOCATION:  1961                         FACILITY:  [facility]   PHYSICIAN:  Jonelle Sidle, MD DATE OF BIRTH:  [birth year]   DATE OF PROCEDURE:  DATE OF DISCHARGE:  11/21/2007                            CARDIAC CATHETERIZATION   REQUESTING CARDIOLOGIST:  Willa Rough, MD, [facility]   PRIMARY CARE PHYSICIAN:  Melvyn Novas, MD   INDICATION:  Mr. Sauls is a pleasant 75 year old male with a history of  coronary artery bypass grafting in December 1998 with a LIMA to the left  anterior descending, saphenous vein graft to the diagonal, saphenous  vein graft to the first and second obtuse marginals and saphenous vein  graft to an acute right ventricular marginal and the posterior  descending branch.  Subsequent to this, he was noted to have a high-  grade stenosis involving the saphenous vein graft to the right coronary  system in February 2004 and this was treated with a drug-eluting stent.  In 2006, he developed restenosis within this stent and had another drug-  eluting stent placed at that time.  He has had residual documentation of  70% diagonal disease which is unbypassed.  Otherwise, he has been stable  symptomatically until the last several months.  He has been experiencing  progressive dyspnea on exertion and anginal pain, although nothing  prolonged and he has not had to use any nitroglycerin.  He had a  relatively low risk Cardiolite done last year.  Given progressive  symptoms, he is referred now for a diagnostic cardiac catheterization to  clearly define the coronary and bypass graft anatomy.  The potential  risks and benefits were explained to him in advance and informed consent  was obtained.   PROCEDURES PERFORMED:  1. Left heart catheterization  2. Selective coronary angiography.  3. Left ventriculography  4. Saphenous vein graft  angiography.  5. Internal mammary artery graft angiography.   Access and equipment:  The area about the right femoral artery was  anesthetized with 1% lidocaine and a 4-French sheath was placed in the  right femoral artery via the modified Seldinger technique.  Standard  preformed 4-French JL-4 and 3-D RCA catheters were used for selective  coronary angiography and also bypass graft angiography.  An angled  pigtail catheter was used for left heart catheterization and left  ventriculography.  All exchanges were made over wire and the patient  tolerated the procedure well without immediate complications.   HEMODYNAMIC RESULTS:  Aorta 126/64 mmHg.  Left ventricle 126/18 mmHg.   ANGIOGRAPHIC FINDINGS:  1. The left main coronary artery gives rise to left anterior      descending and circumflex vessels.  There is approximately 60-70%      ostial stenosis involving the left main.  2. The left anterior descending is a small to medium caliber vessel.      There is approximately 50% stenosis within the proximal segment      prior to the takeoff of the septal perforator at first diagonal  branch.  Within the first diagonal branch, were areas of 80% and      50% stenosis proximally.  Competitive flow from the internal      mammary artery graft is seen within the midportion of the left      anterior descending.  3. Circumflex vessel is medium in caliber.  There is approximately 90%      stenosis noted prior to the origination of two more distal obtuse      marginal branches.  4. The right coronary artery is occluded proximally.  5. The saphenous vein graft to the acute marginal branch of the right      coronary artery and posterior descending branch is patent in the      proximal segment.  There is a stent visualized within the      midportion of the body of the vein graft with what appears to be a      second stent in the more proximal portion of the first stent.      There is flush  occlusion of the vein graft in the midportion of the      stent affecting the distal limb of this graft and the posterior      descending branch.  The acute marginal branch fills normally as      does the more proximal portion of the vein graft.  There is some      left-to-right collateralization noted in the posterior descending      distribution from the circumflex artery and obtuse marginal system.  6. There is a vein graft to the first and second obtuse marginal that      is widely patent.  7. There is a vein graft to a relatively small diagonal branch that is      widely patent.  8. There is an internal mammary artery graft to the mid left anterior      descending that is widely patent and fills the distal vessel well.   Left ventriculography was performed in the RAO projection and revealed  an ejection fraction of approximately 55%.  No significant mitral  regurgitation is noted.   DIAGNOSES:  1. Multivessel coronary artery disease as outlined including a 60-70%      ostial left main, 90% proximal circumflex, and 40-50% proximal left      anterior descending.  There is also an 80% stenosis involving the      first diagonal branch which is not bypassed, although this has been      noted in the past and is relatively stable.  The right coronary      artery is completely occluded.  2. Patent left internal mammary artery to left anterior descending,      patent vein graft to the diagonal, and patent vein grafts to the      first and second obtuse marginals.  The vein graft to the acute      marginal branch of the right coronary artery and posterior      descending is occluded in its midportion which also corresponds to      the midportion of the previously placed stents, specifically, what      appears to be the end of the second stent that was placed in 2006.      This does not affect flow to the acute marginal system.  There is      noted to be some left-to-right collateralization  of the posterior  descending branch from the circumflex distribution.  3. Left ventricular ejection fraction approximately 55% with no      significant mitral regurgitation and a left ventricular end-      diastolic pressure of 18 mmHg.   DISCUSSION:  I reviewed the results with the patient, his son and his  wife.  At this point, would anticipate medical therapy as attempts at re-  intervention to the in-stent occluded portion of the body of the vein  graft of the right coronary system would be likely fraught with a very  high risk of restenosis and potential complication worsening the  baseline situation.  He is on reasonable medical therapy including dual  antiplatelet medications and a statin.  Consideration could be given to  either a low-dose beta-blocker or perhaps even a long-acting nitrate.  Mr. Basso will be discharged today and follow up to see Dr. Myrtis Ser in our  [place] office.      Jonelle Sidle, MD  Electronically Signed     SGM/MEDQ  D:  11/21/2007  T:  11/21/2007  Job:  [id number]   cc:   Melvyn Novas, MD  [name], MD, [facility]

## 2010-10-28 NOTE — H&P (Signed)
NAME:  Jorge Bishop, Jorge Bishop                 ACCOUNT NO.:  [id number]   MEDICAL RECORD NO.:  [id number]          PATIENT TYPE:  INP   LOCATION:  A325                          FACILITY:  [facility]   PHYSICIAN:  Melvyn Novas, MDDATE OF BIRTH:  1935/12/23   DATE OF ADMISSION:  03/21/2008  DATE OF DISCHARGE:  LH                              HISTORY & PHYSICAL   HISTORY:  The patient is a 75 year old white male who has a history of  coronary artery bypass surgery, multiple stents, hyperlipidemia, and  history of Nissen fundoplication who apparently had melanotic stools  over the course of the last evening and he also rose out of bed and had  a near syncopal or syncopal episode.  He specifically denied any  antecedent anginal chest pain or palpitations.  There is no reported  seizure activity.  The patient was seen and admitted to the hospital,  had EGD done.  His hemoglobin was 12 and EGD revealed multiple proximal  and antral stomach ulcers with upper GI essentially revealed a volvulus  of the gastric mucosa.  He was scheduled to be seen by Dr. Lovell Sheehan in  surgical consultation to avoid the possibility of ischemic recurrence of  these distal ulcers.  He was noted to have mild thrombocytopenia with a  platelet count of 138,000 and is on Plavix and aspirin, both of which  will be discontinued for the interim.   PAST MEDICAL HISTORY:  Significant for GERD, right bundle-branch block,  coronary artery disease, status post stenting x4, hypertension, benign  prostatic hypertrophy, erectile dysfunction, and hyperlipidemia.   PAST SURGICAL HISTORY:  Remarkable for CABG x5 in 1998, Nissen  fundoplication, and multiple stenting of saphenous vein graft x4.   CURRENT MEDICATIONS:  Lipitor 20, aspirin 81 mg per day, Plavix 75 mg  per day, Niaspan 500 mg b.i.d., Lipitor 20 mg per day, Uroxatral 10 mg  per day, Bystolic 5 mg per day, and Imdur 30 mg per day.   PHYSICAL EXAMINATION:  VITAL SIGNS:  Blood  pressure 112/68, pulse is 88  and regular, temperature 99.4, O2 sat is 97%.  EYES:  PERRLA.  Extraocular movements intact.  Sclerae clear.  Conjunctivae pink.  NECK:  Shows no JVD, no carotid bruits, no thyromegaly, no thyroid  bruits.  LUNGS:  Clear to A and P.  No rales, wheezes, or rhonchi appreciable.  HEART:  Regular rhythm.  No murmurs or gallops.  No heaves, thrills, or  rubs.  ABDOMEN:  Soft and nontender.  Bowel sounds are normoactive.  No  guarding, rebound, or splenomegaly.  Stools were heme positive  previously reported with melena.  EXTREMITIES:  No clubbing, cyanosis, or edema.  NEUROLOGIC:  Cranial nerves grossly intact.  The patient moves all 4  extremities.   IMPRESSION:  1. Upper gastrointestinal bleed with multiple gastric ulcers.  2. Paraesophageal hernia.  3. Gastric volvulus.  4. Coronary artery disease, status post coronary artery bypass graft      x5 and stenting x4.  5. Thrombocytopenia with platelet count of 138,000.  6. Benign prostatic hypertrophy.  7. Hyperlipidemia.  PLAN:  Serial hemoglobin and hematocrit every 12 hours.  Stop the  aspirin and Plavix.  Obtain cardiology consultation from [facility] as he had a recent cath 4 months ago and with medical therapy  recommended after closure of saphenous vein grafts.  Repeat CBC with  platelet count in a.m. and surgical consultation with Dr. Lovell Sheehan.  The  patient appears hemodynamically stable at present.  I will make further  recommendations as the database expands.      Melvyn Novas, MD  Electronically Signed     RMD/MEDQ  D:  03/21/2008  T:  03/22/2008  Job:  [id number]

## 2010-10-28 NOTE — Consult Note (Signed)
NAME:  Jorge Bishop, Jorge Bishop NO.:  [id number]   MEDICAL RECORD NO.:  [id number]          PATIENT TYPE:  INP   LOCATION:  A325                          FACILITY:  [facility]   PHYSICIAN:  Jonelle Sidle, MD DATE OF BIRTH:  [birth year]   DATE OF CONSULTATION:  03/23/2008  DATE OF DISCHARGE:                                 CONSULTATION   PRIMARY CARDIOLOGIST:  Jorge Abed, MD, [facility].   REASON FOR CONSULTATION:  Preoperative evaluation.   HISTORY OF PRESENT ILLNESS:  Jorge Bishop is a pleasant 75 year old  gentleman with coronary artery disease status post coronary artery  bypass grafting in December 2008 with a LIMA to left anterior  descending, saphenous vein graft to diagonal, saphenous vein graft to  both first and second obtuse marginals and saphenous vein graft to the  right coronary artery.  More recently he underwent drug-eluting stent  placement to the saphenous vein graft to the right coronary artery  system back in February 2006 and has been managed on dual antiplatelet  therapy since that time.  Cardiac catheterization in June of this year  demonstrated occlusion of this vein graft to the right coronary artery,  otherwise patent left system grafts.  He was managed medically with  ejection fraction of 55% and has been stable clinically without any  significant angina or progressive breathlessness beyond NYHA class II.  He is presently admitted to the hospital with evidence of upper  gastrointestinal bleeding, findings of gastric ulcerations and a  paraesophageal hernia that is being considered for surgical repair early  next week.  We were asked specifically to comment on the patient's  perioperative cardiac risks and also the ability to hold antiplatelet  therapy.   ALLERGIES:  CODEINE.   MEDICATIONS:  Medications prior to arrival included:  1. Lipitor 20 mg p.o. daily.  2. Nitroglycerin 0.4 mg sublingual p.r.n.  3. UroXatral 10 mg p.o. daily.  4. Bystolic 5  mg p.o. daily.  5. Aspirin 325 mg p.o. daily.  6. Imdur 30 mg p.o. daily.   PAST HISTORY:  In addition to that outlined above:  1. The patient also is being treated for hyperlipidemia.  2. He has carotid artery disease, 60% bilaterally.  3. Chronic right bundle branch block pattern electrocardiographically.  4. Erectile dysfunction.  5. Benign prostatic hypertrophy.  6. Degenerative joint disease.  7. History of diverticulosis.  8. History of migraine headaches.  9. Gastroesophageal reflux disease.  10.Remote Nissen fundoplication.   SOCIAL HISTORY:  The patient lives in [place] with his wife.  He is  retired from [employer] and subsequently the [employer].  Denies any  tobacco or alcohol use.   FAMILY HISTORY:  Father died in his 93s of myocardial infarction.  His  son died at age 54 with a myocardial infarction.   REVIEW OF SYSTEMS:  Outlined above.  He has had melena, hematochezia  associated with gastrointestinal bleed.  Otherwise no fevers, chills,  cough, hemoptysis, orthopnea, PND, palpitations, syncope exertional  chest pain.  Otherwise negative.   PHYSICAL EXAMINATION:  VITAL SIGNS: Temperature  98.4 degrees, heart rate  70 in sinus rhythm, respirations 20, blood pressure is 106/82.  Oxygen  saturation 90% on room air.  GENERAL:  Well-nourished male in no acute distress.  HEENT:  Conjunctivae normal.  Pharynx clear.  NECK:  Supple.  No elevated venous pressure, loud carotid bruits or  thyromegaly.  LUNGS:  Clear.  CARDIOVASCULAR:  Regular rate and rhythm with a soft systolic murmur at  the base.  No pericardial rub or r S3 gallop.  ABDOMEN:  Soft, diminished bowel sounds.  No tenderness or guarding,  however.  No obvious hepatomegaly.  EXTREMITIES:  No significant pitting  edema.  Distal pulses are 2+.  SKIN:  Warm and dry.  MUSCULOSKELETAL:  No kyphosis noted.  NEUROPSYCHIATRIC:  The patient alert x3.  Affect is appropriate.   Chest/abdominal CT scan  demonstrates a moderate size paraesophageal  hernia as well as cholelithiasis.   Electrocardiogram demonstrates sinus rhythm with a right bundle branch  block pattern which is old and nonspecific ST-T wave changes.   Initial laboratory data showed WBC 6.3, hemoglobin 10.2, hematocrit  29.6, platelets 85,000, down from 138,000.  Sodium 139, potassium 4,  chloride 106, bicarb 24, BUN 63, creatinine 0.9, glucose 172, AST 24,  ALT 28.  INR 1.1, albumin 3.5.   IMPRESSION:  1. Preoperative evaluation in a 75 year old male with known      cardiovascular disease as outlined above, clinically stable without      progressive angina or heart failure symptoms, with findings of an      occluded vein graft to the right coronary system by catheterization      in June of this year, but otherwise patent left system grafts and a      normal left ventricular ejection fraction of 55%.  He has been on      dual antiplatelet therapy.  2. Upper gastrointestinal hemorrhage with findings of gastric      ulcerations and a paraesophageal hernia with remote history of      Nissen fundoplication.  He is being considered for surgical repair      under general anesthesia early next week.  3. Hyperlipidemia, on statin therapy.  4. Carotid artery disease, moderate bilaterally, and asymptomatic.  5. Blood loss anemia with recent hemoglobin of 10.2.  6. Thrombocytopenia with platelet count of 85,000.   RECOMMENDATIONS:  At this point would not anticipate any further cardiac  ischemic evaluation given recent documentation of coronary anatomy and  clinical stability on medical therapy.  It would be reasonable hold  aspirin and Plavix temporarily and perhaps consider instituting aspirin  alone following surgical intervention.  Would  otherwise continue beta blocker therapy, Lipitor and Imdur.  Perioperative risk for cardiac complications is in the mild to moderate  range otherwise.  Would aim to keep hemoglobin close  to 10.  We will  follow the patient with you.      Jonelle Sidle, MD  Electronically Signed     SGM/MEDQ  D:  03/23/2008  T:  03/23/2008  Job:  [id number]   cc:   Jorge Abed, MD, [facility]  [address]  [address]  [place]  [place] [address]   Melvyn Novas, MD  Fax: [phone]

## 2010-10-28 NOTE — Op Note (Signed)
NAME:  Jorge Bishop, JOURDAN NO.:  [id number]   MEDICAL RECORD NO.:  [id number]          PATIENT TYPE:  INP   LOCATION:  A325                          FACILITY:  [facility]   PHYSICIAN:  Kassie Mends, M.D.      DATE OF BIRTH:  12-22-1935   DATE OF PROCEDURE:  03/21/2008  DATE OF DISCHARGE:                               OPERATIVE REPORT   PRIMARY PHYSICIAN:  Melvyn Novas, MD.   PRIMARY CARDIOLOGIST:  [name], MD, [facility].   PROCEDURE:  Esophagogastroduodenoscopy with cold forceps biopsy of  gastric ulcers in the hernia and the antrum.   INDICATION FOR EXAM:  [name] is a 75 year old male who was seen in  June 2009, and evaluated by cardiology due to increasing dyspnea on  exertion.  His cardiac catheterization showed a stenosis in the vein  graft to the acute marginal branch of the right coronary artery and  posterior descending artery was occluded in its midportion.  Medical  therapy was recommended.  It was increased from 81 mg to 325 mg daily.  He reports that over the last 2-3 days, he has had problems with nausea  and vomiting.  Yesterday, he had 6-7 episodes of black tarry stools.  This morning, he had one episode of black tarry stool.  His prior  hemoglobin was in February 2006, and it was 14.8.  His hemoglobin since  admission has been 12.2-12.3.  He denies any difficulty swallowing,  heartburn, indigestion or abdominal pain.  He has had no diarrhea.  He  has had no bright red blood per rectum.  He had a flexible sigmoidoscopy  in 1995 by Dr. Karilyn Cota.   FINDINGS:  1. Normal esophagus without evidence of Barrett's, mass, erosion,      ulceration or stricture.  2. 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  The paraesophageal hernia contains many      ulcers.  Biopsies were obtained via cold forceps in the      paraesophageal hernia.  3. The diagnostic gastroscope was advanced into the distal  portion of      the stomach.  Multiple small ulcers seen in the antrum.  Biopsies      were obtained via cold forceps in the antrum to evaluate for      ischemia or H. pylori gastritis.  4. Normal duodenal bulb and second portion of the duodenum.   RECOMMENDATIONS:  1. Mr. Medearis should have an upper GI to further evaluate the      paraesophageal hernia.  If the biopsies from the paraesophageal      hernia indicate ischemia, then would consider repair of the      paraesophageal hernia.  2. Will discuss subsequent aspirin and Plavix therapy with Dr. Myrtis Ser.      Would recommend the aspirin be held for 14 days, and he restart the      Plavix in 7 days.  His last coronary intervention was in 2006.  3. He should be on b.i.d. PPI therapy.  He should continue PPI therapy      indefinitely if the aspirin is restarted.  4. He should consider an outpatient colonoscopy.  5. Will have Mr. Advani follow up in the office in 2 months to discuss      the benefits versus the risks of colonoscopy.  6. Will call Mr. Vanalstyne with results of his biopsies.  7. Full liquid diet.   MEDICATIONS:  1. Demerol 50 mg IV.  2. Versed 3 mg IV.   PROCEDURE TECHNIQUE:  Physical exam was performed.  Informed consent was  obtained from the patient after explaining the benefits, risks and  alternatives to the procedure.  The patient was connected to the monitor  and placed in the left lateral position.  Continuous oxygen was provided  by nasal cannula and IV medicine administered through an indwelling  cannula.  After administration of sedation, the patient's esophagus was  intubated and the scope was advanced under direct visualization to the  second portion of the duodenum.  The scope was removed slowly by  carefully examining the color, texture, anatomy and integrity of the  mucosa on the way out.  Prior to withdrawal, the cold forceps biopsies  were obtained in the antrum and the paraesophageal hernia.  The patient   was recovered in endoscopy and discharged to the floor in satisfactory  condition.   PATH:  Antral biopsies-H. pylori gastritis. Currently hospitalized for  repair of his paraesophageal hernia. Will begin treatment after  recovered from sugery. PAH Bx-acute/chronic inflammation.      Kassie Mends, M.D.  Electronically Signed     SM/MEDQ  D:  03/21/2008  T:  03/21/2008  Job:  [id number]   cc:   [name], MD, [facility]  [address]  [address]  [place]  [place] [address]   Melvyn Novas, MD  Fax: [phone]

## 2010-10-28 NOTE — Assessment & Plan Note (Signed)
[facility]                          [facility] OFFICE NOTE   NAME:JONESHerndon, Grill                        MRN:          [id number]  DATE:11/01/2006                            DOB:          06/17/1935    PRIMARY CARDIOLOGIST:  Dr. Willa Rough   REASON FOR VISIT:  Scheduled followup.   Please refer to Dr. Henrietta Hoover clinic note of March 27, for complete  details.   The patient presents today for review of a stress test which was ordered  for further evaluation of exertional dyspnea in the context of known  coronary artery disease.  He is now a little over 2 years out from his  last percutaneous intervention with Cypher stenting of a high grade in-  stent restenotic lesion of the SVG-RCA graft.  The patient had otherwise  patent bypass grafts and 70% ostial 1st diagonal disease.  Left  ventricular function was preserved.   The patient exercised nearly 8 minutes, obtaining 103% of PMHR.  There  was no associated chest pain and the perfusion images were felt to  represent a low risk study; calculated ejection fracture 56%.   The patient continues to report no chest pain since undergoing his last  stent procedure.  He does have some mild exertional dyspnea, but with no  associated orthopnea, PND, or lower extremity edema.   The patient has no history of tobacco smoking.   Electrocardiogram today reveals sinus bradycardia at 57 BPM with chronic  right bundle branch block.   MEDICATION REGIMEN:  Unchanged since last visit.   REVIEW OF SYSTEMS:  As noted per HPI.  The remaining systems negative.   PHYSICAL EXAMINATION:  Blood pressure 124/81, pulse 65 regular, weight  208.2 (down 1 pound).  GENERAL:  A 75 year old male sitting upright in no distress.  HEENT:  Normocephalic.  Atraumatic.  NECK:  Palpable carotid pulses without bruits.  LUNGS:  Clear to auscultation all fields.  HEART:  Regular rate and rhythm (S1 and S2), no murmurs, rubs or  gallops.  ABDOMEN:  Soft, nontender.  EXTREMITIES:  Palpable distal pulses, without edema.  No focal deficit.   IMPRESSION:  1. Coronary artery disease - stable.      a.     Recent maximal low risk exercise stress Cardiolite; ejection       fraction 56%.  2. Coronary artery disease.      a.     Status post Cypher stenting saphenous vein graft - right       coronary artery graft secondary to in-stent restenosis, February       2006.      b.     Patent grafts with residual ostial 1st diagonal disease.      c.     Preserved left ventricular function.      d.     Life-long Plavix recommended.  3. Non-obstructive cerebral vascular disease.  4. Chronic right bundle branch block.  5. Dyslipidemia.  6. Erectile dysfunction.   PLAN:  1. Continue current medication regimen.  2. The patient inquired as to the use of  medications for treatment of      erectile dysfunction.  As in the past, we advised him to not take      these in conjunction with any short acting or long acting nitrates      within a 24 hour period.  The patient is quite familiar with these      instructions and has agreed to remain compliant.  3. Schedule a return clinic followup with myself and Dr. Jerral Bonito in      6 months.      Rozell Searing, PA-C  Electronically Signed      [name], MD, [facility]  Electronically Signed   GS/MedQ  DD: 11/01/2006  DT: 11/01/2006  Job #: [id number]   cc:   Melvyn Novas, MD

## 2010-10-28 NOTE — Letter (Signed)
May 15, 2008    Jorge Novas, MD  [address]  [place], [place] [address]   RE:  Jorge Bishop, Jorge Bishop  MRN:  [id number]  /  DOB:  07/24/1935   Dear Jorge Bishop,   Jorge Bishop returned to the office for continued assessment and treatment  of coronary artery disease and cardiovascular risk factors.  He  previously was my patient, but subsequently was seen by Dr. Myrtis Ser in  [place].  He has now returned to the [employer].  He was recently in  the hospital for an upper GI bleed requiring a laparoscopic  fundoplication for hiatal hernia and treatment for H. pylori.  He has  done well since then with some minor residual GI symptoms.  He had no  cardiac complications.  His last cardiac intervention was in 2006 for  stenosis of saphenous vein graft that eventually went on to obstruct.  He is active without any cardiopulmonary symptoms.   CURRENT MEDICATIONS:  1. Aspirin 81 mg daily.  2. Niaspan 1500 mg daily.  3. Omeprazole 20 mg daily.  4. Uroxatral 10 mg daily.  5. Isosorbide 30 mg daily.  6. Bystolic 5 mg daily.  7. Atorvastatin 20 mg daily.  8. Fish oil 1000 mg b.i.d.   PHYSICAL EXAMINATION:  GENERAL:  Vigorous-appearing gentleman in no  acute distress.  VITAL SIGNS:  The weight is 207, stable.  Blood pressure 120/60, heart  rate 70 and regular, respirations 12.  HEENT:  Anicteric sclerae; normal oral mucosa.  NECK:  No jugular venous distention; no carotid bruits.  LUNGS:  Clear.  CARDIAC:  Normal first and second heart sounds; fourth heart sound  present.  ABDOMEN:  Soft and nontender; normal bowel sounds; no bruits; no  organomegaly.  EXTREMITIES:  No edema; normal distal pulses.   Recent lipid profile was excellent.  Chemistry profile was normal.   IMPRESSION:  Jorge Bishop is doing beautifully with respect to heart  disease.  His risk factors are optimally modified.  He raises the  question as to whether clopidogrel should be resumed.  In a patient with  prior  bypass surgery, this does somewhat reduce the risk of a recurrent  cardiac event.  I would favor doing so when Dr. Cira Servant considers the  likelihood of recurrent gastrointestinal bleeding to be low.  That drug  should not be coadministered with omeprazole.  Prevacid or an H2 blocker  could be substituted.  I have asked him to stop his low dose of  isosorbide, which did not provide him any benefit.  I will see this nice  gentleman again in 9 months.    Sincerely,      Jorge Friends. Dietrich Pates, MD, [facility]  Electronically Signed    RMR/MedQ  DD: 05/15/2008  DT: 05/16/2008  Job #: [id number]

## 2010-10-28 NOTE — Op Note (Signed)
NAME:  Jorge Bishop, AMBLE NO.:  [id number]   MEDICAL RECORD NO.:  [id number]          PATIENT TYPE:  INP   LOCATION:  A330                          FACILITY:  [facility]   PHYSICIAN:  Dalia Heading, M.D.  DATE OF BIRTH:  02/10/1936   DATE OF PROCEDURE:  03/28/2008  DATE OF DISCHARGE:                               OPERATIVE REPORT   PREOPERATIVE DIAGNOSIS:  Paraesophageal hernia.   POSTOPERATIVE DIAGNOSIS:  Paraesophageal hernia.   PROCEDURE:  Laparoscopic fundoplication with repair of diaphragmatic  hernia.   SURGEON:  Dalia Heading, MD   ASSISTANT:  Tilford Pillar, MD   ANESTHESIA:  General endotracheal.   INDICATIONS:  The patient is a 75 year old white male status post a  laparoscopic Nissen fundoplication in 1997 who presents with a  paraesophageal hernia.  This appears to be on the right side of the  hiatus.  The risks and benefits of the procedure including bleeding,  infection, cardiopulmonary difficulties, perforation of the intestinal  contents, and the possibility of an open procedure were fully explained  to the patient, gave informed consent.   PROCEDURE NOTE:  The patient was placed in the low lithotomy position  after induction of general endotracheal anesthesia.  The abdomen was  prepped and draped using usual sterile technique with Betadine.  Surgical site confirmation was performed.   A supraumbilical incision was made down to the fascia.  Veress needle  was introduced into the abdominal cavity and confirmation of placement  was done using the saline drop test.  The abdomen was then insufflated  to 16 mmHg pressure.  An 11-mm trocar was introduced into the abdominal  cavity under direct visualization without difficulty.  An additional 11-  mm trocar was placed on the left upper quadrant and left flank regions  as well as the right flank region.  A 5-mm trocar was placed in the  epigastric region.  A liver retractor in a fan-like manner was  placed  over the hiatus to retract the left lobe of the liver superiorly.  The  patient was noted to have an intact Nissen fundoplication with the more  distal portion of the stomach herniating up along the right side of the  hiatus in a paraesophageal manner.  Using the harmonic scalpel, this  portion of the stomach was pulled down out of the hiatus without  difficulty.  The esophagus and proximal stomach with the wrap were noted  to be intact and were freed away from the surrounding hernia sac  structures.  The left and right crura were then fully identified.  A  Shepard hook was then placed posteriorly behind the epigastroesophageal  juncture in order to facilitate repair of the diaphragm.  A 2-0 Surgidac  sutures were then placed posteriorly using the Endo stitch.  Two of  these were placed posteriorly.  Then, two were placed anteriorly along  the crura.  One of the anterior sutures was attached to the stomach in  order to fixate the wrap in a Dor-type fashion.  At the end of the  procedure, the wrap was noted  to be within the peritoneal cavity.  As  stated previously, the previous Nissen was noted to be intact.  All  fluid and air were then evacuated from the abdominal cavity prior to  removal of the trocars.   All wounds were irrigated with normal saline.  All wounds were injected  with 0.5% Sensorcaine.  The 11-mm trocar fascial sites were closed using  0 Vicryl interrupted sutures.  All skin incisions were closed using  staples.  Betadine ointment and dry sterile dressings were applied.   All tape and needle counts were correct at the end of the procedure.  The patient was extubated in the operating room, went back to recovery  room, awake and stable condition.   COMPLICATIONS:  None.   SPECIMEN:  None.   ESTIMATED BLOOD LOSS:  Minimal.      Dalia Heading, M.D.  Electronically Signed     MAJ/MEDQ  D:  03/28/2008  T:  03/29/2008  Job:  [id number]   cc:   Kassie Mends,  M.D.  [address]  [place] , [place] [address]   Melvyn Novas, MD  Fax: [phone]   [name], MD, [facility]  [address]  [address]  [place]  [place] [address]

## 2010-10-28 NOTE — Discharge Summary (Signed)
NAME:  JERRALD, DOVERSPIKE NO.:  [id number]   MEDICAL RECORD NO.:  [id number]          PATIENT TYPE:  INP   LOCATION:  A325                          FACILITY:  [facility]   PHYSICIAN:  Melvyn Novas, MDDATE OF BIRTH:  1935/08/19   DATE OF ADMISSION:  03/21/2008  DATE OF DISCHARGE:  10/09/2009LH                               DISCHARGE SUMMARY   The patient is a 75 year old white male admitted with melena, admitted  with hemoglobin of 5, syncopal episode preceding this.  EGD reveals  multiple distal and proximal gastric erosions with gastric volvulus.  The patient was hemodynamically stable.  Hemoglobin was transfused up to  12.  Melena dissipated.  He had no significant abdominal pain.  Consideration was given to gastric ischemia due to volvulus, and also  had a paraesophageal hernia.  The patient was subsequently discharged.  His platelet count was slightly low at 87,000, and Plavix and aspirin  were discontinued.  He will be admitted again in 4 days' time for  consideration of surgery by Dr. Lovell Sheehan.  We will check his platelet  count and CBC prior to surgery.   DISCHARGE MEDICATIONS:  1. Lipitor 20 daily.  2. Nitrostat 0.4 p.r.n.  3. Uroxatral 10 mg per day.  4. Bystolic 5 mg per day.  5. Isosorbide 30 mg per day.   He is not to take aspirin or Plavix.      Melvyn Novas, MD  Electronically Signed     RMD/MEDQ  D:  03/23/2008  T:  03/24/2008  Job:  [id number]

## 2010-10-28 NOTE — H&P (Signed)
NAME:  Jorge Bishop, Jorge Bishop NO.:  [id number]   MEDICAL RECORD NO.:  [id number]          PATIENT TYPE:  INP   LOCATION:  A325                          FACILITY:  [facility]   PHYSICIAN:  Dalia Heading, M.D.  DATE OF BIRTH:  [birth year]   DATE OF ADMISSION:  03/21/2008  DATE OF DISCHARGE:  10/09/2009LH                              HISTORY & PHYSICAL   CHIEF COMPLAINT:  Paraesophageal hernia.   HISTORY OF PRESENT ILLNESS:  The patient is a 75 year old white male who  presented to the hospital with worsening melanotic stools.  He was noted  be anemic at the time of his recent admission.  An EGD was performed,  which revealed multiple proximal and antral ulcers with upper GI series,  which revealed paraesophageal hernia with the distal stomach up along  the right side of the hiatus.  The patient is status post a Nissen  fundoplication in the remote past and the Nissen fundoplication seems to  be intact.  It is felt that some of his ulcerations were due to both the  use of aspirin, Plavix, as well as mucosal ischemia from the  paraesophageal hernia.   PAST MEDICAL HISTORY:  1. Coronary artery disease.  2. Right bundle-branch block.  3. Hypertension.  4. Benign prostatic hypertrophy.  5. Hyperlipidemia.   PAST SURGICAL HISTORY:  1. Laparoscopic Nissen fundoplication in 1997.  2. CABG in 1998.  3. Multiple stenting of the grafts.   CURRENT MEDICATIONS:  1. Lipitor 20 mg p.o. daily.  2. Plavix and aspirin, which he is holding.  3. Niaspan 500 mg p.o. b.i.d.  4. Uroxatral 10 mg p.o. daily.  5. Bystolic 5 mg p.o. daily.  6. Imdur 30 mg p.o. daily.   ALLERGIES:  No known drug allergies.   REVIEW OF SYSTEMS:  The patient had a recent cardiac catheterization in  June 2009, which revealed occlusion of one of his bypasses, though this  could not be restented.  He has been seen by Cardiology who felt that  there is nothing further for them to do at this point prior to  surgical  intervention.   PHYSICAL EXAMINATION:  GENERAL:  The patient is a pleasant white male in  no acute distress.  LUNGS:  Clear to auscultation with equal breath sounds bilaterally.  HEART:  Regular rate and rhythm without S3, S4, or murmurs.  ABDOMEN:  Soft, nontender, nondistended.  No hepatosplenomegaly, masses,  or hernias are identified.   The patient's hematocrit was 28 at the time of recent discharge,  platelet count 85,000.  Met-7 was remarkable for BUN of 60.   IMPRESSION:  Paraesophageal hernia.   PLAN:  The patient will be admitted to hospital on March 27, 2008, for  possible blood transfusion and/or platelet transfusions due to his  thrombocytopenia as well as anemia.  He will be prepped for surgical  intervention the next day.  He is to undergo laparoscopic paraesophageal  hernia repair, possible open on March 28, 2008.  The risks and  benefits of the procedure including bleeding, infection, cardiopulmonary  difficulties, perforation, and the  possibility of an open procedure were  fully explained to the patient, gave informed consent.      Dalia Heading, M.D.  Electronically Signed     MAJ/MEDQ  D:  03/24/2008  T:  03/24/2008  Job:  [id number]   cc:   Melvyn Novas, MD  Fax: [phone]   Kassie Mends, M.D.  [address]  [place] , [place] [address]   [facility]

## 2010-10-28 NOTE — Discharge Summary (Signed)
NAME:  Jorge Bishop, FRISBEE NO.:  [id number]   MEDICAL RECORD NO.:  [id number]          PATIENT TYPE:  INP   LOCATION:  A330                          [facility]AC[facility]   PHYSICIAN:  Dalia Heading, M.D.  DATE OF BIRTH:  12-Sep-1935   DATE OF ADMISSION:  03/27/2008  DATE OF DISCHARGE:  10/16/2009LH                               DISCHARGE SUMMARY   HOSPITAL COURSE SUMMARY:  The patient is a 75 year old white male who  had presented to [facility] last week with hematemesis  secondary to ulcerations in a loop of the stomach that resulted from a  paraesophageal hernia.  He was also noted to have thrombocytopenia and  anemia.  He did receive blood transfusions in the past.  He was  discharged and then readmitted for surgery.  Just prior to surgery, his  hemoglobin was 11.3 with a platelet count of 116.  He went to the  operating room on March 28, 2008, underwent a laparoscopic repair of  the paraesophageal hernia, fundoplication.  He tolerated the procedure  very well.  His postoperative course was unremarkable.  His diet was  advanced without difficulty.  His platelet count was 120 on March 29, 2008.  His hemoglobin was 11.1.  In consultation with Cardiology, he was  elected to hold off on resuming the Plavix.  He will be resumed on  aspirin as an outpatient.  He is also going to be treated for H. pylori  infection.   The patient is being discharged home in good and improving condition.   DISCHARGE INSTRUCTIONS:  The patient is to follow up Dr. Franky Macho on  April 05, 2008.   DISCHARGE MEDICATIONS:  1. Bystolic 5 mg p.o. daily.  2. Imdur 30 mg p.o. daily.  3. Lipitor 1 tablet p.o. daily.  4. Multivitamin 1 tablet p.o. daily.  5. Omega-3 daily.  6. Biaxin 500 mg p.o. b.i.d. x10 days.  7. Flagyl 500 mg p.o. b.i.d. x10 days.  8. Darvocet-N 100 1-2 tablets p.o. q.6 h. p.r.n. pain.  9. Prilosec 20 mg p.o. b.i.d. x1 month.   PRINCIPAL DIAGNOSES:  1.  Paraesophageal hernia.  2. Gastric ulcerations.  3. Coronary artery disease.  4. Hypertension.  5. Benign prostatic hypertrophy.  6. Hyperlipidemia.   PRINCIPAL PROCEDURE:  Laparoscopic repair of paraesophageal  hernia/fundoplication on March 28, 2008.      Dalia Heading, M.D.  Electronically Signed     MAJ/MEDQ  D:  03/30/2008  T:  03/30/2008  Job:  [id number]   cc:   Melvyn Novas, MD  Fax: [phone]   Kassie Mends, M.D.  [address]  [place] , [place] [address]   [name], MD, [facility]  [address]  [address]  [place]  [place] [address]

## 2010-10-28 NOTE — Assessment & Plan Note (Signed)
NAME:  Jorge Bishop, Jorge Bishop                  CHART#:  [id number]   DATE:  08/07/2008                       DOB:  1935-07-17   PRIMARY PHYSICIAN:  Melvyn Novas, MD   PROBLEM LIST:  1. Paraesophageal hernia repair in October 2009.  2. Helicobacter pylori gastritis treated in October 2009.  3. Coronary artery disease.  4. Right bundle-branch block.  5. Hypertension.  6. Benign prostate hypertrophy.  7. Hyperlipidemia.  8. Laparoscopic Nissen fundoplication in 1997.  9. Coronary artery bypass graft in 1998.  10.Multiple coronary artery stents.   SUBJECTIVE:  The patient is a 75 year old male who presents as a return  patient visit.  He was last seen in January 2010.  He had a feeling of  fullness that was associated with constipation.  He is asked to drink  more water and add fiber.  He states the bloating is gone.  He has a  bowel movement 5-6 times a week.  He is wondering if he can get back on  his Plavix.  His appetite is good.   MEDICATIONS:  1. Niaspan.  2. Uroxatral.  3. Bystolic.  4. Lipitor.  5. Omega 3.  6. Aspirin 81 mg daily.  7. Multivitamin 2 daily.  8. Omeprazole twice a day.   OBJECTIVE:  VITAL SIGNS:  Weight 213 pounds (unchanged since January  2010), height 6 feet, temperature 97.8, blood pressure 118/68, pulse  60.GENERAL:  He is in no apparent distress.  Alert and oriented  x4.HEENT:  Atraumatic, normocephalic.  Pupils equal and reactive to  light.  Mouth, no oral lesions.  Posterior pharynx without erythema or  exudate.NECK:  Has full range of motion and no lymphadenopathy.LUNGS:  Clear to auscultation bilaterally.CARDIOVASCULAR:  Regular  rhythm.ABDOMEN:  Bowel sounds are present, soft, nontender,  nondistended.  No rebound or guarding.   ASSESSMENT:  The patient is a 75 year old male who presents in followup  after having gastric ulcers due to 2 different etiologies.  He had a  paraesophageal hernia, which was causing ischemic ulcers and he also  had  Helicobacter pylori gastritis, which was treated with amoxicillin and  Flagyl because Biaxin caused his throat to swell.  Thank you for  allowing me to see the patient in consultation.  My recommendations  follow.   RECOMMENDATIONS:  1. I had a long conversation with the patient in regards to future      management of his H. pylori gastritis and his coronary artery      stents.  He desires to be back on Plavix.  I explained to him the      Plavix is not associated with ulcer formation.  If he had      incomplete treatment for his H. pylori gastritis, then he can be      susceptible to ulcers again.  He would like to confirm resolution      of his infection with H. pylori stool antigen.  2. Will check a hemoglobin and hematocrit once a day.  His last      hemoglobin and hematocrit was over a month ago and it was 13.9 and      44.5.  3. If the H. pylori antigen is negative, then we will consider      decreasing his omeprazole to once a day.  I  did explain to him that      there is a school of thought that omeprazole and Plavix have an      interaction and makes Plavix less effective.  The [employer]      of Gastroenterology does not endorse this idea.  4. Outpatient visit in 4 months.  Will call him with results of his      stool study and blood test.  His contact phone numbers are [phone]      [facility]) or [phone].       Kassie Mends, M.D.  Electronically Signed     SM/MEDQ  D:  08/08/2008  T:  08/08/2008  Job:  [id number]   cc:   Gerrit Friends. Dietrich Pates, MD, [facility]  Melvyn Novas, MD

## 2010-10-28 NOTE — Assessment & Plan Note (Signed)
[facility]                          [facility] OFF[facility] NOTE   NAME:Jorge Bishop, Hupfer                        MRN:          [id number]  DATE:11/17/2007                            DOB:          08-24-1935    PRIMARY CARDIOLOGIST:  [name], MD, [facility]   REASON FOR VISIT:  Annual follow-up.   Jorge Bishop returns to our clinic since last seen here in May 2008.  At  that time, he returned in follow-up of an exercise stress Cardiolite,  ordered by Dr. Myrtis Ser, for further evaluation of exertional dyspnea in the  context of known CAD.  The patient was able to exercise nearly 8  minutes, achieving a PMHR of 103%, with no associated chest discomfort  or significant EKG changes.  Perfusion imaging suggested a relatively  small fixed apical defect with no large reversible perfusion defects;  calculated EF 56%.   Over these past 4-5 months, however, [name] has noted significant  exertional dyspnea which he feels has progressed.  There is also some  associated chest pressure, which promptly relieves with rest.  He  denies any chest discomfort at rest.  He has not had to use any  nitroglycerin.  However, he is concerned that his exertional dyspnea is  reminiscent of symptoms prior to both his bypass surgery 1998, as well  as subsequent percutaneous stenting.   The patient has never smoked tobacco.   Mr. Pen had a recent lipid profile, ordered by Dr. Janna Arch, showing  very good control with a reported LDL of 76, HDL 38, total cholesterol  of 120 and triglycerides of 110.   Electrocardiogram today reveals NSR with chronic RBBB at 66 bpm.  There  are no acute changes.   ALLERGIES:  CODEINE, ERYTHROMYCIN.   CURRENT MEDICATIONS:  1. Plavix.  2. Aspirin 81 mg daily.  3. Lipitor 20 mg daily.  4. Flomax 0.4 mg daily.  5. Omega-3 three fish oil 100 g b.i.d.  6. Niaspan 500 mg q.a.m./1000 mg q.p.m.  7. Uroxatral 10 mg daily.   PAST MEDICAL HISTORY:  1. Multivessel coronary artery disease.      a.     Six-vessel CABG, December 1998, by Dr. Kathlee Nations Trigt:       LIMA-LAD; SVG-diagonal; SVG-OM-1-OM-2; SVG-RV marginal-PDA.      b.     Status post bare metal stenting of high-grade SVG-acute       marginal-PDA graft stenosis, February 2004.      c.     Status post Cypher stenting of SVG-distal RCA graft in-stent       restenosis, February 2006.      d.     Residual continued patency of remaining bypass grafts;       residual 70% ostial first diagonal stenosis.      e.     Preserved left ventricular function.  2. Dyslipidemia.  3. Nonobstructive cerebrovascular disease.      a.     Reported 60% bilateral ICA stenosis, by recent study.  4. Chronic RBBB.  5. Erectile dysfunction.  6. BPH.  7. Degenerative joint disease.  8. History of diverticulosis.  9. History of migraines.  10.History of dizziness.   SOCIAL HISTORY:  The patient has never smoked tobacco.  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   FAMILY HISTORY:  Father deceased his 61s, complications of a myocardial  infarction.  The patient has a son who had an MI at age 70.   REVIEW OF SYSTEMS:  Has noted some mild lower extremity edema, which he  states is new.  However, denies PND or orthopnea.  Has been plagued by  recurrent, nonproductive cough.  Otherwise as noted per HPI, remaining  systems negative.   PHYSICAL EXAMINATION:  Blood pressure 123/71, pulse 72, regular.  Weight  215.  GENERAL:  A 75 year old male, sitting upright, in no distress.  HEENT:  Normocephalic, atraumatic.  NECK:  Palpable bilateral carotid pulses without bruits.  No JVD.  LUNGS:  Clear to auscultation in all fields.  HEART:  Regular rate and rhythm (S1, s2).  No significant murmurs.  No  rubs or gallops.  ABDOMEN:  Soft, nontender, intact bowel sounds.  No bruits.  EXTREMITIES:  Palpable bilateral femoral pulses without bruits.  Palpable distal pulses with trace pedal edema.   NEUROLOGIC:  No focal deficit.   IMPRESSION:  Jorge Bishop is a 75 year old male with multivessel coronary  artery disease, status post remote coronary artery bypass grafting and  subsequent stenting of the saphenous vein graft to distal right coronary  artery graft, but with most recent percutaneous intervention involving  Cypher stenting of the vein graft secondary to in-stent restenosis in  2006.   The patient presents with progressive exertional dyspnea for the past  several months, which is worrisome for anginal equivalent.  Moreover, he  reports some associated chest pressure, which promptly resolves with  rest.  Of note, the patient has not had to take any nitroglycerin and he  denies any symptoms at rest.   Electrocardiogram in our office today reveals normal sinus rhythm with  chronic right bundle branch block and no acute changes.   PLAN:  Following review with Dr. Willa Rough, recommendation is to  proceed with diagnostic coronary angiography and possible percutaneous  intervention.  The patient is quite amenable to this option, given that  he had a negative stress test just 1 year ago.  Moreover, he would  prefer to have a definitive explanation for his symptoms, with which I  concur.  [name] symptoms are quite worrisome for probable  significant CAD progression.   The risks/benefits of the procedure was discussed with the patient, in  conjunction with Dr. Willa Rough, and he has agreed to proceed.  We  will therefore arrange to have this set up in our [facility] catheterization lab  in the next few days.   Medications will be adjusted with up-titration of aspirin to full dose,  addition of Imdur 30 mg daily, and a new prescription for p.r.n.  nitroglycerin.      Rozell Searing, PA-C  Electronically Signed      [name], MD, [facility]  Electronically Signed   GS/MedQ  DD: 11/17/2007  DT: 11/17/2007  Job #: [id number]   cc:   Melvyn Novas, MD

## 2010-10-31 NOTE — Cardiovascular Report (Signed)
NAME:  Jorge Bishop, Jorge Bishop                           ACCOUNT NO.:  [id number]   MEDICAL RECORD NO.:  [id number]                   PATIENT TYPE:  OIB   LOCATION:  2899                                 FACILITY:  [facility]   PHYSICIAN:  Jorge Bishop, M.D. [facility]         DATE OF BIRTH:  1936/04/22   DATE OF PROCEDURE:  08/01/2002  DATE OF DISCHARGE:                              CARDIAC CATHETERIZATION   INDICATIONS FOR PROCEDURE:  The patient is a pleasant 75 year old who has  previously undergone revascularization surgery by Dr. Lovett Bishop in  1998.  At that time, he had 6 grafts, including a left internal mammary to  the LAD, saphenous vein graft to the diagonal, sequential saphenous vein  graft to the OM1 and 2, and sequential saphenous vein graft to the acute  marginal and to the posterior descending branch.  He has done well, but over  the past two months has had intermittent chest discomfort.  It has been  assumed that this might be related to reflux as it has not been exertion-  related, and a Cardiolite was done which revealed some inferior ischemia.  He was therefore set up for cardiac catheterization by Dr. Myrtis Bishop and Jorge Bishop.  We discussed the case in detail prior to the procedure.   PROCEDURE:  1. Left heart catheterization.  2. Selective coronary arteriography.  3. Selective left ventriculography.  4. Saphenous vein graft angiography x3.  5. Selective left internal mammary angiography x1.  6. Stenting of the saphenous vein graft to the posterior descending branch.   DESCRIPTION OF PROCEDURE:  The patient was brought to the catheterization  lab and prepped and draped in the usual fashion.  Through an anterior  puncture, the right femoral artery was easily entered.  Views of the left  and right coronary arteries were obtained.  Following this, a saphenous vein  graft angiography x3 was performed followed by selective left internal  mammary angiography.  Following this,  ventriculography was performed in the  RAO projection.  I then discussed the case with the patient, and  subsequently, with his family.  It appeared that the patient had a  moderately high-grade saphenous vein graft stenosis just distal to the graft  insertion site into the acute marginal.  We discussed the options, and it  was my recommendation the patient have a stent placed here with distal  protection.  The patient wanted to proceed today, and I spoke with his  family who was also in agreement.  Following this, preparations were made  for intervention.  Heparin was given according to protocol.  Glycoprotein  inhibitors were not administered based on prior interventional studies.  Aspirin was given, and Plavix was given with the procedure.  A right bypass  catheter, 7-French, was used to intubate the right coronary bypass ostium.  An EPI filter wire was then placed in the distal vessel.  The first filter  wire had to be removed as the covering sheath could not be pulled off the  filter wire itself.  This was replaced by a second filter wire which then  was successfully deployed.  Intracoronary nitroglycerin was given during  this.  Frequent ACTs were checked.  The lesion was then directly stented  using a 2.75 x 20-mm Express2 stent.  This was taken up to about 14 to 15  atmospheres with marked improvement in the appearance of the artery.  The  stent was subsequently removed.  The recovery sheath was then passed and the  filter wire recovery achieved.  The filter wire was then removed.  Final  angiographic views were obtained.  The femoral sheath was sewn into place.  He was taken to the holding area in satisfactory clinical condition.   HEMODYNAMIC DATA:  1. Central aorta 129/69, mean 95.  2. LV:  111/9.  3. No aortic-left ventricular gradient on pullback across the aortic valve.   ANGIOGRAPHIC DATA:  1. Ventriculography was performed in the RAO projection.  Because of      ventricular ectopy, ejection fraction was not calculated.  On a post-PVC     beat, it appeared to be in excess of 55%.  2. The left main coronary artery had about 70% to 80% ostial narrowing.  3. The LAD itself had 70% to 80% mid narrowing at the takeoff of the     diagonal branch.  The diagonal branch itself had about 70% narrowing.     More distally, there was a second lesion.  There was competitive filling     in the distal LAD.  4. The left internal mammary to the distal LAD was widely patent.  5. There was a very small graft that went to a diagonal branch.  Whether     this represents an intermedius or a diagonal was unclear.  6. The circumflex had 70% and then 90% narrowing, and had competitive     filling in the distal vessel.  7. The saphenous vein graft sequentially to the OM1 and OM2 was widely     patent with good runoff into the distal vessel.  8. The right coronary artery was totally occluded proximally after some     diffuse disease.  9. The saphenous vein graft to the acute marginal branch and PDA was widely     patent.  There was a fair amount of irregularity in the distal right     coronary circulation in general.  There was an 80% stenosis in the     saphenous vein graft beyond the acute marginal insertion site.  Following     stenting, this was reduced to 0%, and there was no evidence of distal     embolization or slow flow in the distal vessel.   CONCLUSION:  1. Preserved left ventricular function.  2. Continued patency of the internal mammary to the left anterior descending     artery.  3. Continued patency of the saphenous vein grafts to the obtuse marginal     number 1 and 2, sequentially, and to the diagonal.  4. High-grade stenosis in the body of the graft between the insertion of the     acute marginal and the distal posterior descending artery, with     successful stenting.   DISPOSITION:  The patient will be treated with aspirin and Plavix.  Followup will  be with Dr. Myrtis Bishop.  Jorge Bishop, M.D. [facility]    TDS/MEDQ  D:  08/01/2002  T:  08/01/2002  Job:  [id number]   cc:   Jorge Bishop, M.D.  [address]  [place]  [place] [address]  Fax: [phone]   Willa Rough, M.D. [facility]   Cardiac Catheterization Lab

## 2010-10-31 NOTE — Discharge Summary (Signed)
NAME:  Jorge Bishop, Jorge Bishop NO.:  [id number]   MEDICAL RECORD NO.:  [id number]          PATIENT TYPE:  INP   LOCATION:  6524                           FACILITY:   PHYSICIAN:  Arturo Morton. Riley Kill, M.D. [facility] OF BIRTH:  06/09/1936   DATE OF ADMISSION:  07/18/2004  DATE OF DISCHARGE:  07/19/2004                           DISCHARGE SUMMARY - REFERRING   HISTORY:  Mr. Reading is a 75 year old white male who over the proceeding  several months has noticed some chest discomfort with exertion. He also  described a couple of episodes at rest. He feels that the discomfort is  similar to prior to his bypass grafting. Dr. Myrtis Ser is convinced that his  symptoms were significant for possible coronary ischemia; thus, arrangements  were made for cardiac catheterization.   His history is also notable for dizziness which Dr. Sandria Manly feels like may be  early Parkinson's. History is also notable for bypass grafting in 1998 with  stenting in 2004 to a vein graft, hyperlipidemia with Niaspan for a low HDL,  chronic right bundle branch block, diverticulosis, BPH, DJD, migraines,  erectile dysfunction, carotid bruit; however, carotid Dopplers did not show  significant stenosis.   Mr. Rushlow had a catheterization in the [facility] Catheterization Lab. Please see  dictated report. After review, it was felt that he should undergo elective  PCI to a focal in-stent restenosis of the saphenous vein graft to the AM for  an 80 to 90% stenosis. This was arranged for July 18, 2004.   LABORATORY DATA:  Prior to the procedure, H&H was 15.7 and 45.4, normal  indices, platelets 153, WBC is 5.6. Post-procedure, no significant change  except platelets were noted to be 146. Prior to procedure, sodium 138,  potassium 4.7, BUN 14, creatinine 1.1, glucose 109. Post-procedure, no  significant change. EKG prior to procedure showed normal sinus rhythm, right  bundle branch block, probable old inferior myocardial  infarction.   HOSPITAL COURSE:  Mr. Swingle underwent CYPHER stenting by Dr. Riley Kill to the  proximal saphenous vein graft to the AM reducing this lesion to 10% without  complications. Post-sheath removal on bedrest, catheterization site was  intact and on February 4, after review by Dr. Gerri Spore, it was felt that he  could be discharged home.   DISCHARGE DIAGNOSIS:  Unstable angina status post CYPHER stenting to the  saphenous vein graft to the right coronary artery. History as previously  described.   DISPOSITION:  He is discharged home.   DISCHARGE MEDICATIONS:  He is asked to continue his aspirin 325 once daily,  Foltx once daily, Plavix 75 once daily, Lipitor 20 mg q.h.s., papaverine as  previously, Niaspan 1000 mg once daily, and nitroglycerin 0.4 as needed.   DISCHARGE INSTRUCTIONS:  He was advised no lifting, driving, sexual  activity, or heavy exertion for 2 days. Maintain low-salt, low-fat, low-  cholesterol diet. If he had any problems with his catheterization site, he  was asked to call.   FOLLOWUP:  On Monday, he was asked to call the [facility] office to arrange a 2 to  3 week followup appointment  with Dr. Myrtis Ser.      EW/MEDQ  D:  07/19/2004  T:  07/19/2004  Job:  [id number]   cc:   Willa Rough, M.D.   Genene Churn. Love, M.D.  [address]  [address]  [place]  [place] [address]  Fax: [phone]

## 2010-10-31 NOTE — Discharge Summary (Signed)
NAME:  Jorge Bishop, Jorge Bishop                           ACCOUNT NO.:  [id number]   MEDICAL RECORD NO.:  [id number]                   PATIENT TYPE:  OIB   LOCATION:  6533                                 FACILITY:  [facility]   PHYSICIAN:  Arturo Morton. Riley Kill, M.D. [facility]         DATE OF BIRTH:  [birth year]   DATE OF ADMISSION:  08/01/2002  DATE OF DISCHARGE:  08/02/2002                           DISCHARGE SUMMARY - REFERRING   DISCHARGE DIAGNOSES:  1. Coronary artery disease, status post stent placement to the saphenous     vein graft to the posterior descending artery (PDA) utilizing an express     II stent on August 01, 2002 by Dr. Riley Kill.  2. History of coronary artery bypass graft in 1998 by Dr. Kathlee Nations Trigt.  3. Hyperlipidemia, treated.  4. Chronic right bundle branch block.  5. History of diverticulosis.  6. Benign prostatic hypertrophy.  7. Degenerative joint disease.  8. Erectile dysfunction.  9. History of migraines.   HOSPITAL COURSE:  [name] was initially seen in the office on July 25, 2002 for substernal chest discomfort.  He had previously had an exercise  nuclear study that revealed some inferior ischemia.  Because of his symptoms  and his abnormal Cardiolite he was set up for heart catheterization.  The  catheterization revealed the following:  preserved LV function, left main  80% ostial, LAD 80% mid with a diagonal of 70% stenosis, LIMA to the LAD  patent, circumflex had a 70% followed by 90% narrowing, saphenous vein graft  sequentially to the OM1 and OM2 was patent, RCA totally occluded proximally,  saphenous vein graft to the acute marginal and PDA was patent.  There was an  80% stenosis in the saphenous vein graft beyond the acute marginal insertion  site.  This underwent stenting using an express stent and this was reduced  to a 0% lesion post procedure.  At this point the patient was started on  Plavix and remained in the hospital overnight.  He remained stable  and  laboratory work on discharge revealed a potassium 4.2, hemoglobin 14.9.  He  was discharged to home in stable condition on the following medications:   DISCHARGE MEDICATIONS:  1. Coated aspirin 325 mg one p.o. daily.  2. Foltx one daily.  3. Multivitamins daily.  4 . Toprol XL 25 mg daily.  1. Lipitor 10 mg daily.  2. Plavix 75 mg daily for one month only.  3. Sublingual nitroglycerin p.r.n. chest pain.   ACTIVITY:  No strenuous activity for two days then gradually increase  activity.   DIET:  Remain on a low fat diet.   DISCHARGE INSTRUCTIONS:  Clean catheter site with soap and water.  Call for  questions or concerns.    FOLLOW UP:  The patient is instructed to follow up with Dr. Myrtis Ser on August 30, 2002 at 10:30 a.m. in the [place] office.  Guy Franco, P.A. [facility]                      Thomas D. Riley Kill, M.D. [facility]    LB/MEDQ  D:  08/02/2002  T:  08/02/2002  Job:  [id number]

## 2010-10-31 NOTE — Assessment & Plan Note (Signed)
[facility]                          [facility]   NAME:Jorge Bishop                        MRN:          [id number]  DATE:09/09/2006                            DOB:          1936/03/09    Jorge Bishop is doing well.  I have seen him today for cardiology follow-  up.  He had a Cypher stent placed in an area of in-stent restenosis in  February 2006.  He is not having any significant problems with chest  pain.  He is having some exertional shortness of breath.  At this point  it is not clear if this is an anginal equivalent or not, but we will  assess this further.   Dr. Janna Arch has done an excellent job of following all of Jorge Bishop'  parameters.  His labs show that on Niaspan he continues to improve in  combination with his Lipitor.  He also had carotid Dopplers done.  He  has bilateral 40-59% stenoses.  He is being treated appropriately for  this, and this will need to be followed.   PAST MEDICAL HISTORY:  Allergies:  CODEINE.   Medications:  1. Plavix 75 mg.  2. Lipitor 20 mg.  3. Multivitamin.  4. Aspirin 81 mg.  5. Niaspan 500 mg in the morning and 1000 mg in the evening.  6. Flomax.  7. Omega-3.   Other medical problems:  See the list below.   REVIEW OF SYSTEMS:  Jorge Bishop mentions some exertional shortness of  breath.  See the HPI above.  He also raises the questions of whether or  not he could have some petechiae on his legs.  See the physical exam  below.  Otherwise, his review of systems is negative.   PHYSICAL EXAMINATION:  VITAL SIGNS:  Today his blood pressure is 125/69  with a pulse of 70.  Weight is 209 pounds.  GENERAL:  The patient is oriented to person, time and place, and his  affect is normal.  HEENT:  No xanthelasma.  He has normal extraocular motion.  There is a  question of a soft right carotid bruit.  NECK:  There are no carotid bruits.  There is no jugular venous  distention.  LUNGS:  Clear.   Respiratory effort is not labored.  CARDIAC:  An S1 with an S2.  There were no clicks or significant  murmurs.  ABDOMEN:  Soft.  There are no masses or bruits.  EXTREMITIES:  On his legs he has a few very small red spots, which I  believe are tiny cherry angiomas.  These are insignificant.  This is  not petechiae.  He does not have a skin abnormality that I can see at  this point.  He has no significant peripheral edema.  There are no  musculoskeletal deformities.   His follow-up cholesterol labs show that his total cholesterol is 114  with an LDL of 68 and triglycerides at 57.  His HDL is 35.  His liver  functions are good.  He is doing well.   PROBLEM LIST:  1. Coronary disease  post Cypher stent for in-stent restenosis in      February 2006.  We will proceed with a stress Cardiolite scan now      as it has been 2 years.  This will also help with the assessment of      his shortness of breath.  2. Hyperlipidemia.  He is being treated very well for this.  3. Old right bundle branch block.  4. History of diverticulosis.  5. Benign prostatic hypertrophy.  6. Degenerative joint disease.  7. History of migraines.  8. History of some erectile dysfunction.  9. History of some dizziness thought to possibly be related to early      parkinsonism.  10.Right carotid bruit with 40-59% bilateral carotid stenoses.   Overall he appears to be stable.  We have talked about multiple other  issues.  He asked me about coenzyme Q10 and I feel that this is not  formally recommended at this time.   I will see him back in 6 weeks to re-review the issues of his exercise  test and his shortness of breath.     [name], MD, [facility]  Electronically Signed    JDK/MedQ  DD: 09/09/2006  DT: 09/09/2006  Job #: [id number]   cc:   Jorge Novas, MD

## 2010-10-31 NOTE — Cardiovascular Report (Signed)
NAME:  Jorge Bishop, Jorge Bishop NO.:  [id number]   MEDICAL RECORD NO.:  [id number]          PATIENT TYPE:  OIB   LOCATION:  6501                         FACILITY:  [facility]   PHYSICIAN:  Jorge Bishop, M.D. [facility] OF BIRTH:  21-Aug-1935   DATE OF PROCEDURE:  07/15/2004  DATE OF DISCHARGE:                              CARDIAC CATHETERIZATION   INDICATIONS:  Jorge Bishop is a 75 year old who has had prior bypass surgery by  Dr. Donata Bishop.  This was done in 1998.  He subsequently had a stent placed  to his vein graft to the distal right coronary.  He has done well, but  recently has developed some recurrent angina pectoris.  The current study  was done to assess coronary anatomy.   PROCEDURES:  1.  Left heart catheterization  2.  Selective coronary arteriography.  3.  Selective left ventriculography.  4.  Saphenous vein graft angiography x2.  5.  Selective left internal mammary angiography.   DESCRIPTION OF PROCEDURE:  The procedure was performed from the right  femoral artery using 6-French catheters.  He tolerated procedure well.  He  was taken to holding area in satisfactory clinical condition.  I spoke Dr.  Myrtis Bishop after the procedure, and to the patient's family.  He was set up for  PCI on Friday.   HEMODYNAMIC DATA:  1.  Central aortic pressure 133/76.  2.  Left ventricular pressure 133/11.  3.  No gradient pullback across aortic valve.   ANGIOGRAPHIC DATA:  1.  Ventriculography was formed in the RAO projection.  Overall systolic      function was well-preserved.  2.  The left main was free of critical disease.  3.  Left anterior descending artery has about 70% narrowing at its mid      portion with takeoff of a major diagonal.  The major diagonal itself      appears also to have 70% segmental ostial narrowing.  The distal LAD      demonstrates competitive filling compatible with a patent mammary      artery.  It is difficult to tell whether the diagonal graft goes  to this      diagonal or to another diagonal, but there is no evidence of competitive      filling of this particular diagonal; moreover the distal diagonal seen      on the vein graft injection appears to be smaller.  4.  The saphenous vein graft to a small diagonal and/or intermediate is      widely patent.  5.  The circumflex demonstrates totally occluded obtuse marginal and then      severe disease of a second obtuse marginal in the AV circumflex.  6.  The saphenous vein graft sequentially to the to circumflex marginal      branches appears to be widely patent.  7.  The right coronary artery is totally occluded proximally.  8.  The vein graft to the acute marginal and PDA is patent.  There is a      previously placed stent just after the acute  marginal insertion and this      demonstrates about 80-90% focal restenosis within the stent itself.   CONCLUSION:  1.  Continued patency of the internal mammary to the distal left anterior      descending artery.  2.  Continued patency to a small diagonal.  3.  Native disease involving a first diagonal.  4.  Continued patency of saphenous vein graft to two marginal sequentially  5.  Continued patency of the sequential saphenous vein graft to acute      marginal and PDA with a high-grade in-stent restenosis at the previous      site of stenting.   DISPOSITION:  The patient will be brought back for PCI on Friday.  The drug-  alluding stent will be placed in the currently existing stent.      TDS/MEDQ  D:  07/15/2004  T:  07/15/2004  Job:  [id number]   cc:   Jorge Bishop, M.D.   Jorge Bishop, M.D.  [address]  [place]  [place] [address]  Fax: [phone]

## 2010-10-31 NOTE — Cardiovascular Report (Signed)
NAME:  Jorge Bishop, Jorge Bishop NO.:  [id number]   MEDICAL RECORD NO.:  [id number]          PATIENT TYPE:  INP   LOCATION:  6524                         FACILITY:  [facility]   PHYSICIAN:  Arturo Morton. Riley Kill, M.D. [facility] OF BIRTH:  08-15-1935   DATE OF PROCEDURE:  07/18/2004  DATE OF DISCHARGE:  07/19/2004                              CARDIAC CATHETERIZATION     There is a dictated percutaneous intervention report in [facility]  medical record number [id number] date is 07/18/2004,, Jerral Bonito Dr. Dewayne Hatch does see the laboratory the patient'skatz medical record.   INDICATIONS:  This gentleman is a very nice patient who has had previous  bypass surgery. He underwent repeat catheterization which demonstrated  continued patency of his grafts. He does have diagonal disease as noted on  previous study. However, there was a high-grade in-stent restenosis of the  right coronary artery. I discussed the case with Dr. Myrtis Ser. Repeat  percutaneous intervention was recommended with in-stent using a drug-eluting  platform. The patient is on lifelong clopidogrel.   PROCEDURE:  Percutaneous stenting of the saphenous vein graft to the distal  right coronary.   DESCRIPTION OF PROCEDURE:  The patient was brought to the catheterization  laboratory after informed consent prepped and draped in usual fashion.  Through an anterior puncture, the left femoral artery was easily entered. A  6-French sheath was placed. Bivalirudin was given according to protocol. The  patient was already on Plavix. The patient had evidence of in-stent  restenosis at the previous site of percutaneous intervention in the  midportion of the stent. With this, we crossed initially with a 2.25 mm  balloon. This was eventually taken up to 2.5.  A 15 mm balloon was used to  predilate the lesion. Following this, a 23 mm length 2.5 Cypher drug-eluting  stent was passed down and placed across the previously placed stent.  This  was taken up to about 15 atmospheres. There was a continued dumbbell at the  site. The 2.75 Quantum Maverick balloon was then taken and the vessel post  dilated. We eventually placed a 3.0 x 8 mm length balloon at the site and  took this up to 16 atmospheres. There was some improvement in the area at  this point with about a residual of 10%. There was no evidence of vessel  dissection or complication and the final angiographic result was less than  20% residual narrowing at the site of percutaneous intervention.   ANGIOGRAPHIC DATA:  The saphenous vein graft to the distal right coronary  circulation inserts into a acute marginal and a PDA. The acute marginal  itself is somewhat diffusely diseased. Following stenting of the midvessel,  this area of 90% restenosis inside the stent is reduced to about a residual  of about 10%. Final angiographic results are satisfactory.   CONCLUSION:  Successful percutaneous stenting of a previous in-stent  restenosis.   The patient has residual disease of the diagonal. All of his grafts remain  opened. The stent was successfully reopened.  Continued Plavix will be  recommended. Arturo Morton.  He 9 cm dictation      TDS/MEDQ  D:  07/18/2004  T:  07/19/2004  Job:  [id number]   cc:   Willa Rough, M.D.   [name], M.D.  [address]  [place]  [place] [address]  Fax: [phone]   CV Laboratory   Patient's medical record

## 2010-10-31 NOTE — Assessment & Plan Note (Signed)
[facility]                            [facility] NOTE   NAME:Stegall, JULIEN OSCAR                        MRN:          [id number]  DATE:01/08/2006                            DOB:          [birth year]    Jorge Bishop is seen today in the [facility].  He is doing very  well.  He had a Cypher stent placed in an area of in-stent restenosis in  February of 2006.  He is not having any significant problems.  He does have  intermittent dizziness but is a chronic problem that is not cardiac in  origin.   The patient has received excellent care from Dr. Janna Arch concerning his  lipids, and Niaspan has been started. Jorge Bishop is tolerating them and he is  following carefully with Dr. Janna Arch.  He has had analysis from the  [facility].   ALLERGIES:  CODEINE.   MEDICATIONS:  1.  Plavix 75.  2.  Lipitor 20.  3.  Niaspan 500 in the morning and 1000 at night.  4.  Multivitamin.  5.  Aspirin 81.   OTHER MEDICAL PROBLEMS:  See the list below.   REVIEW OF SYSTEMS:  He is feeling fine other than the episodes of dizziness  that he has approximately once every six months.   PHYSICAL EXAMINATION:  VITAL SIGNS:  Blood pressure today 122/60 with a  heart rate of 61.  The patient is oriented to person, time and place and  affect is normal.  LUNGS:  Clear. Respiratory effort is not labored.  HEENT:  Reveals no xanthelasma.  He has normal extraocular motion.  He has  no carotid bruits today.  There is no jugular venous distension.  CARDIAC:  Reveals S1 and S2.  There are no clicks or significant murmurs.  ABDOMEN:  Soft.  He has no masses or bruits.  EXTREMITIES:  There is no significant peripheral edema.   EKG:  Reveals old right bundle-branch block.   LABORATORY DATA:  On his recent [place] labs his LDL was 89.  HDL is still  in the 30s.  He has further labs pending based on his higher dose of  Niaspan.   PROBLEMS:  1.  Coronary disease -  stable.  2.  Hyperlipidemia.  Dr. Janna Arch is following the patient very carefully.  3.  Old right bundle-branch block.  4.  History of diverticulosis.  5.  Benign prostatic hyperplasia.  6.  Degenerative joint disease.  7.  History of some migraines.  8.  History of some erectile dysfunction.  9.  Dizziness thought to possibly be related to early Parkinsonism.  10. Question of a soft carotid bruit in the past with a Doppler showing no      significant stenosis.   The patient mentions that Dr. Janna Arch may want him on a higher dose of  aspirin.  I mentioned to the patient that from the pure cardiac viewpoint, I  am okay with 81 mg.  If there is another definite indication it will be fine  with me for the patient to be  on a higher dose of aspirin.  He is on Plavix.                                   [name], MD, [facility]   JDK/MedQ  DD:  01/08/2006  DT:  01/08/2006  Job #:  [id number]   cc:   Melvyn Novas, MD

## 2011-03-17 LAB — DIFFERENTIAL
Basophils Absolute: 0
Basophils Absolute: 0
Basophils Absolute: 0
Basophils Absolute: 0
Basophils Relative: 0
Basophils Relative: 0
Eosinophils Absolute: 0
Eosinophils Absolute: 0
Eosinophils Absolute: 0
Eosinophils Absolute: 0.2
Eosinophils Relative: 0
Eosinophils Relative: 0
Eosinophils Relative: 1
Eosinophils Relative: 3
Eosinophils Relative: 3
Lymphocytes Relative: 19
Lymphocytes Relative: 26
Lymphocytes Relative: 37
Lymphs Abs: 1.4
Lymphs Abs: 2.3
Monocytes Absolute: 0.5
Monocytes Absolute: 0.5
Monocytes Absolute: 0.7
Monocytes Relative: 11
Monocytes Relative: 5
Monocytes Relative: 8
Neutro Abs: 3.3
Neutro Abs: 6.8
Neutro Abs: 9.4 — ABNORMAL HIGH
Neutrophils Relative %: 53

## 2011-03-17 LAB — CROSSMATCH: Antibody Screen: NEGATIVE

## 2011-03-17 LAB — COMPREHENSIVE METABOLIC PANEL
ALT: 28
AST: 24
Alkaline Phosphatase: 37 — ABNORMAL LOW
BUN: 63 — ABNORMAL HIGH
Calcium: 8.9
Chloride: 106
Creatinine, Ser: 0.92
GFR calc non Af Amer: >60
Glucose, Bld: 172 — ABNORMAL HIGH
Potassium: 4
Sodium: 139
Total Protein: 6.1

## 2011-03-17 LAB — BASIC METABOLIC PANEL
CO2: 26
CO2: 28
Chloride: 106
Chloride: 106
Creatinine, Ser: 0.89
GFR calc Af Amer: >60
GFR calc non Af Amer: >60
Glucose, Bld: 100 — ABNORMAL HIGH
Potassium: 3.9
Sodium: 138
Sodium: 139

## 2011-03-17 LAB — CBC
HCT: 29.6 — ABNORMAL LOW
HCT: 32.6 — ABNORMAL LOW
HCT: 35.6 — ABNORMAL LOW
HCT: 36.5 — ABNORMAL LOW
Hemoglobin: 10.2 — ABNORMAL LOW
Hemoglobin: 11.1 — ABNORMAL LOW
Hemoglobin: 11.3 — ABNORMAL LOW
Hemoglobin: 12.3 — ABNORMAL LOW
MCHC: 33.7
MCHC: 33.9
MCHC: 34.6
MCV: 89.6
MCV: 90.7
MCV: 91.1
Platelets: 107 — ABNORMAL LOW
Platelets: 138 — ABNORMAL LOW
Platelets: 85 — ABNORMAL LOW
RBC: 3.3 — ABNORMAL LOW
RBC: 3.6 — ABNORMAL LOW
RBC: 3.97 — ABNORMAL LOW
RDW: 13.5
RDW: 13.8
RDW: 14.1
RDW: 14.1
WBC: 12.1 — ABNORMAL HIGH
WBC: 6.3
WBC: 8.6

## 2011-03-17 LAB — GLUCOSE, CAPILLARY: Glucose-Capillary: 138 — ABNORMAL HIGH

## 2011-03-17 LAB — APTT: aPTT: 29

## 2011-03-17 LAB — ABO/RH: ABO/RH(D): A POS

## 2011-03-17 LAB — HEMOGLOBIN AND HEMATOCRIT, BLOOD
HCT: 27.8 — ABNORMAL LOW
Hemoglobin: 10 — ABNORMAL LOW

## 2011-04-16 ENCOUNTER — Encounter: Payer: Self-pay | Admitting: Cardiology

## 2011-04-16 ENCOUNTER — Ambulatory Visit: Payer: Medicare Other | Admitting: Cardiology

## 2011-04-20 ENCOUNTER — Encounter: Payer: Self-pay | Admitting: Cardiology

## 2011-04-20 ENCOUNTER — Ambulatory Visit (INDEPENDENT_AMBULATORY_CARE_PROVIDER_SITE_OTHER): Payer: Medicare Other | Admitting: Cardiology

## 2011-04-20 VITALS — BP 108/65 | HR 69 | Ht 72.0 in | Wt 201.0 lb

## 2011-04-20 DIAGNOSIS — G43909 Migraine, unspecified, not intractable, without status migrainosus: Secondary | ICD-10-CM | POA: Insufficient documentation

## 2011-04-20 DIAGNOSIS — I2510 Atherosclerotic heart disease of native coronary artery without angina pectoris: Secondary | ICD-10-CM | POA: Insufficient documentation

## 2011-04-20 DIAGNOSIS — I679 Cerebrovascular disease, unspecified: Secondary | ICD-10-CM

## 2011-04-20 DIAGNOSIS — D696 Thrombocytopenia, unspecified: Secondary | ICD-10-CM | POA: Insufficient documentation

## 2011-04-20 DIAGNOSIS — I10 Essential (primary) hypertension: Secondary | ICD-10-CM

## 2011-04-20 DIAGNOSIS — E785 Hyperlipidemia, unspecified: Secondary | ICD-10-CM | POA: Insufficient documentation

## 2011-04-20 DIAGNOSIS — K922 Gastrointestinal hemorrhage, unspecified: Secondary | ICD-10-CM | POA: Insufficient documentation

## 2011-04-20 DIAGNOSIS — N529 Male erectile dysfunction, unspecified: Secondary | ICD-10-CM | POA: Insufficient documentation

## 2011-04-20 NOTE — Progress Notes (Signed)
HPI : Jorge Bishop returns to the office as scheduled for continued assessment and treatment of coronary disease and cardiovascular risk factors, now 15 years following CABG surgery.  He has required percutaneous intervention on 2 occasions, most recently in 2006.  He now reports some increased dyspnea on exertion.  He also has atypical chest discomfort described more as paresthesias rather than pressure or pain that he associates with recurrent ischemia prior to both percutaneous interventions.  He has had no orthopnea, PND, lightheadedness or syncope.  Carotid ultrasound was performed a few months ago at which time the patient was informed that he has bilateral 60% stenosis.  Current Outpatient Prescriptions on File Prior to Visit  Medication Sig Dispense Refill  . alfuzosin (UROXATRAL) 10 MG 24 hr tablet Take 10 mg by mouth daily.        Marland Kitchen aspirin (ASPIRIN LOW DOSE) 81 MG tablet Take 81 mg by mouth daily.        Marland Kitchen atorvastatin (LIPITOR) 20 MG tablet Take 20 mg by mouth at bedtime.        . niacin (NIASPAN) 500 MG CR tablet Take 500 mg by mouth every morning.       Marland Kitchen omeprazole (PRILOSEC) 20 MG capsule Take 20 mg by mouth 2 (two) times daily.          Allergies  Allergen Reactions  . Bactrim   . Clarithromycin     REACTION: UNKNOWN REACTION  . Codeine     REACTION: UNKNOWN REACTION  . Erythromycin   . Zithromax (Azithromycin)       Past medical history, social history, and family history reviewed and updated.  ROS: See history of present illness.  PHYSICAL EXAM: BP 108/65  Pulse 69  Ht 6' (1.829 m)  Wt 91.173 kg (201 lb)  BMI 27.26 kg/m2  SpO2 97%  General-Well developed; no acute distress Body habitus-proportionate weight and height Neck-No JVD; minimal right carotid bruits Lungs-clear lung fields; resonant to percussion Cardiovascular-normal PMI; normal S1 and S2; S4 present Abdomen-normal bowel sounds; soft and non-tender without masses or organomegaly Musculoskeletal-No  deformities, no cyanosis or clubbing Neurologic-Normal cranial nerves; symmetric strength and tone Skin-Warm, no significant lesions Extremities-distal pulses intact; no edema  ASSESSMENT AND PLAN:

## 2011-04-20 NOTE — Assessment & Plan Note (Addendum)
Dr. Janna Arch is following this problem and arranging for serial noninvasive testing.  The relatively low risk associated with 60% internal carotid artery stenosis was discussed with [name].  He wonders whether additional medical therapy should be provided to him, but I explained that the risk-benefit ratio does not favor addition of antithrombotic agents other than aspirin.

## 2011-04-20 NOTE — Assessment & Plan Note (Addendum)
Patient's current symptoms are of concern in light of his previous episodes of disease progression.  Accordingly, we will proceed with a stress echocardiogram to assess for myocardial ischemia.  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

## 2011-04-20 NOTE — Assessment & Plan Note (Addendum)
Most recent lipid profile was excellent despite patient electing to stop omega-3 fatty acids some months ago.  Current therapy is effective and will be continued.  Patient requests samples.  Rosuvastatin will be provided to him at 50% of his current atorvastatin dosage

## 2011-04-20 NOTE — Patient Instructions (Signed)
Your physician recommends that you schedule a follow-up appointment in: 6 months with Dr Dietrich Pates.  We will send you a reminder letter  Your physician has requested that you have a stress echocardiogram. For further information please visit [email or web address] Please follow instruction sheet as given.

## 2011-04-30 ENCOUNTER — Ambulatory Visit (HOSPITAL_COMMUNITY)
Admission: RE | Admit: 2011-04-30 | Discharge: 2011-04-30 | Disposition: A | Discharge status: Home / Self Care | Payer: Medicare Other | Source: Ambulatory Visit | Attending: Family Medicine | Admitting: Family Medicine

## 2011-04-30 ENCOUNTER — Encounter (HOSPITAL_COMMUNITY): Payer: Self-pay | Admitting: Cardiology

## 2011-04-30 DIAGNOSIS — I2510 Atherosclerotic heart disease of native coronary artery without angina pectoris: Secondary | ICD-10-CM

## 2011-04-30 NOTE — Progress Notes (Signed)
Stress Lab Nurses Notes - [facility]  LEYTON MAGOON 04/30/2011  Reason for doing test: CAD and Dyspnea  Type of test: Stress Echo  Nurse performing test: Parke Poisson, RN  Nuclear Medicine Tech: Not Applicable  Echo Tech: Karrie Doffing  MD performing test: Ival Bible & Joni Reining NP  Family MD: DonDiego  Test explained and consent signed: yes  IV started: No IV started  Symptoms: Fatigue in legs  Treatment/Intervention: None  Reason test stopped: reached target HR  After recovery IV was: NA  Patient to return to Nuc. Med at :NA  Patient discharged: Home  Patient's Condition upon discharge was: stable  Comments: During test peak BP 158/58 & HR 131. Recovery BP 110/58 & HR 77. Symptoms resolved in recovery.  Erskine Speed T

## 2011-04-30 NOTE — Progress Notes (Signed)
*  PRELIMINARY RESULTS* Echocardiogram Echocardiogram Stress Test has been performed.  Conrad  04/30/2011, 11:44 AM

## 2011-05-22 ENCOUNTER — Telehealth: Payer: Self-pay | Admitting: Cardiology

## 2011-05-22 NOTE — Telephone Encounter (Signed)
Results of Stress Echo done on 04/30/11.Marland Kitchen/ tg

## 2011-05-26 ENCOUNTER — Telehealth: Payer: Self-pay | Admitting: *Deleted

## 2011-05-26 NOTE — Telephone Encounter (Signed)
Attempted to call patient with results.  Awaiting recommendations from Dr Dietrich Pates regarding appropriate follow up to test.

## 2011-05-26 NOTE — Telephone Encounter (Signed)
Results called to patient regarding stress echo.  Advised him that Dr Dietrich Pates has reviewed the echo and I will certainly call him if Dr Dietrich Pates has any further recommendations.

## 2011-06-04 ENCOUNTER — Encounter: Payer: Self-pay | Admitting: Cardiology

## 2011-10-27 ENCOUNTER — Ambulatory Visit (INDEPENDENT_AMBULATORY_CARE_PROVIDER_SITE_OTHER): Payer: Medicare Other | Admitting: Cardiology

## 2011-10-27 ENCOUNTER — Encounter: Payer: Self-pay | Admitting: Cardiology

## 2011-10-27 VITALS — BP 136/65 | HR 61 | Ht 72.0 in | Wt 203.0 lb

## 2011-10-27 DIAGNOSIS — E785 Hyperlipidemia, unspecified: Secondary | ICD-10-CM

## 2011-10-27 DIAGNOSIS — I2510 Atherosclerotic heart disease of native coronary artery without angina pectoris: Secondary | ICD-10-CM

## 2011-10-27 DIAGNOSIS — I679 Cerebrovascular disease, unspecified: Secondary | ICD-10-CM

## 2011-10-27 DIAGNOSIS — I10 Essential (primary) hypertension: Secondary | ICD-10-CM

## 2011-10-27 DIAGNOSIS — Z87898 Personal history of other specified conditions: Secondary | ICD-10-CM

## 2011-10-27 DIAGNOSIS — D696 Thrombocytopenia, unspecified: Secondary | ICD-10-CM

## 2011-10-27 NOTE — Assessment & Plan Note (Signed)
Serum lipid levels are quite low.  Current therapy is adequate.

## 2011-10-27 NOTE — Assessment & Plan Note (Signed)
Patient is now doing well following CABG surgery in 1998 and 3 interventions in the SVG to the first marginal with subsequent total obstruction.  Stress echo in 04/2011 revealed mild ischemia in the circumflex territory with normal EF.  In the absence of significant symptoms, current medical therapy appears adequate with optimal control of cardiovascular risk factors.  Patient is advised to remain as active as possible but not to continue exercising should chest discomfort or dyspnea occur.

## 2011-10-27 NOTE — Progress Notes (Deleted)
**Note De-Identified Jorge Bishop Obfuscation** Name: Jorge Bishop    DOB: 1936-02-05  Age: 76 y.o.  MR#: [id number]       PCP:  Jorge Stalling, MD, MD      Insurance: @PAYORNAME @   CC:    Chief Complaint  Patient presents with  . 6 month f/u    Pt. c/o sob with exertion. List+    VS BP 136/65  Pulse 61  Ht 6' (1.829 m)  Wt 203 lb (92.08 kg)  BMI 27.53 kg/m2  Weights Current Weight  10/27/11 203 lb (92.08 kg)  04/20/11 201 lb (91.173 kg)  04/30/10 199 lb (90.266 kg)    Blood Pressure  BP Readings from Last 3 Encounters:  10/27/11 136/65  04/20/11 108/65  04/30/10 128/75     Admit date:  (Not on file) Last encounter with RMR:  06/04/2011   Allergy Allergies  Allergen Reactions  . Bactrim   . Clarithromycin     REACTION: UNKNOWN REACTION  . Codeine     REACTION: UNKNOWN REACTION  . Erythromycin   . Zithromax (Azithromycin)     Current Outpatient Prescriptions  Medication Sig Dispense Refill  . alfuzosin (UROXATRAL) 10 MG 24 hr tablet Take 10 mg by mouth daily.        Marland Kitchen aspirin (ASPIRIN LOW DOSE) 81 MG tablet Take 81 mg by mouth daily.        Marland Kitchen atorvastatin (LIPITOR) 20 MG tablet Take 20 mg by mouth at bedtime.        Marland Kitchen labetalol (NORMODYNE) 200 MG tablet Take 200 mg by mouth 2 (two) times daily.        . niacin (NIASPAN) 1000 MG CR tablet Take 1,000 mg by mouth at bedtime.        . niacin (NIASPAN) 500 MG CR tablet Take 500 mg by mouth every morning.       Marland Kitchen omeprazole (PRILOSEC) 20 MG capsule Take 20 mg by mouth 2 (two) times daily.         Discontinued Meds:   There are no discontinued medications.  Patient Active Problem List  Diagnoses  . HYPERTENSION  . BUNDLE BRANCH BLOCK, RIGHT  . BENIGN PROSTATIC HYPERTROPHY, HX OF  . Hyperlipidemia  . Cerebrovascular disease  . Thrombocytopenia  . Erectile dysfunction  . Migraine headache  . Upper gastrointestinal hemorrhage  . Arteriosclerotic cardiovascular disease (ASCVD)    LABS No visits with results within 3 Month(s) from this visit. Latest  known visit with results is:  Orders Only on 08/01/2010  Component Date Value  . MRSA, PCR 08/01/2010 NEGATIVE   . Staphylococcus aureus 08/01/2010 *                   Value:POSITIVE                                The Xpert SA Assay (FDA                         approved for NASAL specimens                         only), is one component of                         a comprehensive surveillance **Note De-Identified Riaz Onorato Obfuscation** program.  It is not intended                         to diagnose infection nor to                         guide or monitor treatment. RESULT CALLED TO, READ BACK BY AND VERIFIED WITH: FAINT,K ON 08/01/10 AT 1625 BY LOY,C     Results for this Opt Visit:     Results for orders placed in visit on 08/01/10  SURGICAL PCR SCREEN      Component Value Range   MRSA, PCR NEGATIVE  NEGATIVE    Staphylococcus aureus   (*) NEGATIVE    Value: POSITIVE            The Xpert SA Assay (FDA     approved for NASAL specimens     only), is one component of     a comprehensive surveillance     program.  It is not intended     to diagnose infection nor to     guide or monitor treatment. RESULT CALLED TO, READ BACK BY AND VERIFIED WITH: FAINT,K ON 08/01/10 AT 1625 BY LOY,C    EKG No orders found for this or any previous visit.   Prior Assessment and Plan Problem List as of 10/27/2011          Cardiology Problems   HYPERTENSION   BUNDLE BRANCH BLOCK, RIGHT   Hyperlipidemia   Last Assessment & Plan Note   04/20/2011 Office Visit Addendum 04/21/2011  5:10 PM by Jorge Brunswick, MD    Most recent lipid profile was excellent despite patient electing to stop omega-3 fatty acids some months ago.  Current therapy is effective and will be continued.  Patient requests samples.  Rosuvastatin will be provided to him at 50% of his current atorvastatin dosage    Cerebrovascular disease   Last Assessment & Plan Note   04/20/2011 Office Visit Addendum 04/21/2011  5:09 PM by Jorge Brunswick, MD     Dr. Janna Bishop is following this problem and arranging for serial noninvasive testing.  The relatively low risk associated with 60% internal carotid artery stenosis was discussed with Jorge Bishop.  He wonders whether additional medical therapy should be provided to him, but I explained that the risk-benefit ratio does not favor addition of antithrombotic agents other than aspirin.    Migraine headache   Arteriosclerotic cardiovascular disease (ASCVD)   Last Assessment & Plan Note   04/20/2011 Office Visit Addendum 04/21/2011  5:08 PM by Jorge Brunswick, MD    Patient's current symptoms are of concern in light of his previous episodes of disease progression.  Accordingly, we will proceed with a stress echocardiogram to assess for myocardial ischemia.  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      Other   BENIGN PROSTATIC HYPERTROPHY, HX OF   Thrombocytopenia   Erectile dysfunction   Upper gastrointestinal hemorrhage       Imaging: No results found.   FRS Calculation: Score not calculated. Missing: Total Cholesterol

## 2011-10-27 NOTE — Progress Notes (Signed)
Patient ID: ELCHANAN BOB, male   DOB: 11/02/1935, 76 y.o.   MRN: [id number]  HPI: Scheduled return visit for this very nice 76 year old gentleman with long-standing coronary artery disease, but no recent symptoms.  A stress echocardiogram approximately 8 months ago demonstrated ischemia in the circumflex territory, which has been known to be occluded.  He is typically not symptomatic, walking up to a few miles per day, but experiences dyspnea when ascending stairs.  Prior to Admission medications   Medication Sig Start Date End Date Taking? Authorizing Provider  alfuzosin (UROXATRAL) 10 MG 24 hr tablet Take 10 mg by mouth daily.     Yes Historical Provider, MD  aspirin (ASPIRIN LOW DOSE) 81 MG tablet Take 81 mg by mouth daily.     Yes Historical Provider, MD  atorvastatin (LIPITOR) 20 MG tablet Take 20 mg by mouth at bedtime.     Yes Historical Provider, MD  fish oil-omega-3 fatty acids 1000 MG capsule Take 2 g by mouth daily.   Yes Historical Provider, MD  labetalol (NORMODYNE) 200 MG tablet Take 200 mg by mouth 2 (two) times daily.     Yes Historical Provider, MD  niacin (NIASPAN) 1000 MG CR tablet Take 1,000 mg by mouth at bedtime.     Yes Historical Provider, MD  niacin (NIASPAN) 500 MG CR tablet Take 500 mg by mouth every morning.    Yes Historical Provider, MD  omeprazole (PRILOSEC) 20 MG capsule Take 20 mg by mouth 2 (two) times daily.    Yes Historical Provider, MD   Allergies  Allergen Reactions  . Bactrim   . Clarithromycin     REACTION: UNKNOWN REACTION  . Codeine     REACTION: UNKNOWN REACTION  . Erythromycin   . Zithromax (Azithromycin)      Past medical history, social history, and family history reviewed and updated.  ROS: Denies orthopnea, PND, pedal edema, palpitations, lightheadedness or syncope.  All other systems reviewed and are negative.  PHYSICAL EXAM: BP 136/65  Pulse 61  Ht 6' (1.829 m)  Wt 92.08 kg (203 lb)  BMI 27.53 kg/m2  General-Well developed; no acute  distress Body habitus-proportionate weight and height Neck-No JVD; no carotid bruits Lungs-clear lung fields; resonant to percussion Cardiovascular-normal PMI; normal S1 and S2; minimal systolic murmur Abdomen-normal bowel sounds; soft and non-tender without masses or organomegaly Musculoskeletal-No deformities, no cyanosis or clubbing Neurologic-Normal cranial nerves; symmetric strength and tone Skin-Warm, no significant lesions Extremities-distal pulses intact; no edema  ASSESSMENT AND PLAN:  Stacey Street Bing, MD 10/27/2011 5:02 PM

## 2011-10-27 NOTE — Assessment & Plan Note (Signed)
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  Continued monitoring is appropriate.

## 2011-10-27 NOTE — Patient Instructions (Signed)
Your physician recommends that you schedule a follow-up appointment in: 1 year  Your physician has recommended you make the following change in your medication:  1 - Add fish oil 2 tablets daily

## 2011-10-27 NOTE — Assessment & Plan Note (Signed)
Blood pressure control has been excellent over the past 3 years.  Current therapy will be continued.

## 2012-07-15 ENCOUNTER — Encounter (HOSPITAL_COMMUNITY): Payer: Self-pay | Admitting: Pharmacy Technician

## 2012-07-20 NOTE — Patient Instructions (Addendum)
Your procedure is scheduled on: 07/25/2012  Report to [facility] at  1230   AM.  Call this number if you have problems the morning of surgery: [phone]   Do not eat food or drink liquids :After Midnight.      Take these medicines the morning of surgery with A SIP OF WATER:flomax,labetolol,prilosec   Do not wear jewelry, make-up or nail polish.  Do not wear lotions, powders, or perfumes. You may wear deodorant.  Do not shave 48 hours prior to surgery.  Do not bring valuables to the hospital.  Contacts, dentures or bridgework may not be worn into surgery.  Leave suitcase in the car. After surgery it may be brought to your room.  For patients admitted to the hospital, checkout time is 11:00 AM the day of discharge.   Patients discharged the day of surgery will not be allowed to drive home.  :     Please read over the following fact sheets that you were given: Coughing and Deep Breathing, Surgical Site Infection Prevention, Anesthesia Post-op Instructions and Care and Recovery After Surgery    Cataract A cataract is a clouding of the lens of the eye. When a lens becomes cloudy, vision is reduced based on the degree and nature of the clouding. Many cataracts reduce vision to some degree. Some cataracts make people more near-sighted as they develop. Other cataracts increase glare. Cataracts that are ignored and become worse can sometimes look white. The white color can be seen through the pupil. CAUSES   Aging. However, cataracts may occur at any age, even in newborns.   Certain drugs.   Trauma to the eye.   Certain diseases such as diabetes.   Specific eye diseases such as chronic inflammation inside the eye or a sudden attack of a rare form of glaucoma.   Inherited or acquired medical problems.  SYMPTOMS   Gradual, progressive drop in vision in the affected eye.   Severe, rapid visual loss. This most often happens when trauma is the cause.  DIAGNOSIS  To detect a cataract, an  eye doctor examines the lens. Cataracts are best diagnosed with an exam of the eyes with the pupils enlarged (dilated) by drops.  TREATMENT  For an early cataract, vision may improve by using different eyeglasses or stronger lighting. If that does not help your vision, surgery is the only effective treatment. A cataract needs to be surgically removed when vision loss interferes with your everyday activities, such as driving, reading, or watching TV. A cataract may also have to be removed if it prevents examination or treatment of another eye problem. Surgery removes the cloudy lens and usually replaces it with a substitute lens (intraocular lens, IOL).  At a time when both you and your doctor agree, the cataract will be surgically removed. If you have cataracts in both eyes, only one is usually removed at a time. This allows the operated eye to heal and be out of danger from any possible problems after surgery (such as infection or poor wound healing). In rare cases, a cataract may be doing damage to your eye. In these cases, your caregiver may advise surgical removal right away. The vast majority of people who have cataract surgery have better vision afterward. HOME CARE INSTRUCTIONS  If you are not planning surgery, you may be asked to do the following:  Use different eyeglasses.   Use stronger or brighter lighting.   Ask your eye doctor about reducing your medicine dose or  changing medicines if it is thought that a medicine caused your cataract. Changing medicines does not make the cataract go away on its own.   Become familiar with your surroundings. Poor vision can lead to injury. Avoid bumping into things on the affected side. You are at a higher risk for tripping or falling.   Exercise extreme care when driving or operating machinery.   Wear sunglasses if you are sensitive to bright light or experiencing problems with glare.  SEEK IMMEDIATE MEDICAL CARE IF:   You have a worsening or sudden  vision loss.   You notice redness, swelling, or increasing pain in the eye.   You have a fever.  Document Released: 06/01/2005 Document Revised: 05/21/2011 Document Reviewed: 01/23/2011 [facility] Patient Information 2012 [place], [place].PATIENT INSTRUCTIONS POST-ANESTHESIA  IMMEDIATELY FOLLOWING SURGERY:  Do not drive or operate machinery for the first twenty four hours after surgery.  Do not make any important decisions for twenty four hours after surgery or while taking narcotic pain medications or sedatives.  If you develop intractable nausea and vomiting or a severe headache please notify your doctor immediately.  FOLLOW-UP:  Please make an appointment with your surgeon as instructed. You do not need to follow up with anesthesia unless specifically instructed to do so.  WOUND CARE INSTRUCTIONS (if applicable):  Keep a dry clean dressing on the anesthesia/puncture wound site if there is drainage.  Once the wound has quit draining you may leave it open to air.  Generally you should leave the bandage intact for twenty four hours unless there is drainage.  If the epidural site drains for more than 36-48 hours please call the anesthesia department.  QUESTIONS?:  Please feel free to call your physician or the hospital operator if you have any questions, and they will be happy to assist you.

## 2012-07-21 ENCOUNTER — Other Ambulatory Visit: Payer: Self-pay

## 2012-07-21 ENCOUNTER — Encounter (HOSPITAL_COMMUNITY): Payer: Self-pay

## 2012-07-21 ENCOUNTER — Encounter (HOSPITAL_COMMUNITY)
Admission: RE | Admit: 2012-07-21 | Discharge: 2012-07-21 | Disposition: A | Discharge status: Home / Self Care | Payer: Medicare Other | Source: Ambulatory Visit | Attending: Ophthalmology | Admitting: Ophthalmology

## 2012-07-21 HISTORY — DX: Gastro-esophageal reflux disease without esophagitis: K21.9

## 2012-07-21 HISTORY — DX: Shortness of breath: R06.02

## 2012-07-21 LAB — BASIC METABOLIC PANEL
BUN: 13 mg/dL (ref 6–23)
Creatinine, Ser: 0.91 mg/dL (ref 0.50–1.35)
GFR calc Af Amer: >90 mL/min (ref >90)
GFR calc non Af Amer: 81 mL/min — ABNORMAL LOW (ref >90)

## 2012-07-22 MED ORDER — LIDOCAINE HCL (PF) 1 % IJ SOLN
INTRAMUSCULAR | Status: AC
  Filled 2012-07-22: qty 2

## 2012-07-22 MED ORDER — TETRACAINE HCL 0.5 % OP SOLN
OPHTHALMIC | Status: AC
  Filled 2012-07-22: qty 2

## 2012-07-22 MED ORDER — CYCLOPENTOLATE-PHENYLEPHRINE 0.2-1 % OP SOLN
OPHTHALMIC | Status: AC
  Filled 2012-07-22: qty 2

## 2012-07-22 MED ORDER — NEOMYCIN-POLYMYXIN-DEXAMETH 3.5-10000-0.1 OP OINT
TOPICAL_OINTMENT | OPHTHALMIC | Status: AC
  Filled 2012-07-22: qty 3.5

## 2012-07-25 ENCOUNTER — Encounter (HOSPITAL_COMMUNITY)
Admission: RE | Disposition: A | Discharge status: Home / Self Care | Payer: Self-pay | Source: Ambulatory Visit | Attending: Ophthalmology

## 2012-07-25 ENCOUNTER — Ambulatory Visit (HOSPITAL_COMMUNITY)
Admission: RE | Admit: 2012-07-25 | Discharge: 2012-07-25 | Disposition: A | Discharge status: Home / Self Care | Payer: Medicare Other | Source: Ambulatory Visit | Attending: Ophthalmology | Admitting: Ophthalmology

## 2012-07-25 ENCOUNTER — Encounter (HOSPITAL_COMMUNITY): Payer: Self-pay | Admitting: Anesthesiology

## 2012-07-25 ENCOUNTER — Ambulatory Visit (HOSPITAL_COMMUNITY): Payer: Medicare Other | Admitting: Anesthesiology

## 2012-07-25 DIAGNOSIS — H2510 Age-related nuclear cataract, unspecified eye: Secondary | ICD-10-CM | POA: Insufficient documentation

## 2012-07-25 DIAGNOSIS — Z01812 Encounter for preprocedural laboratory examination: Secondary | ICD-10-CM | POA: Insufficient documentation

## 2012-07-25 DIAGNOSIS — Z01810 Encounter for preprocedural cardiovascular examination: Secondary | ICD-10-CM | POA: Insufficient documentation

## 2012-07-25 DIAGNOSIS — Z951 Presence of aortocoronary bypass graft: Secondary | ICD-10-CM | POA: Insufficient documentation

## 2012-07-25 DIAGNOSIS — I10 Essential (primary) hypertension: Secondary | ICD-10-CM | POA: Insufficient documentation

## 2012-07-25 HISTORY — PX: CATARACT EXTRACTION W/PHACO: SHX586

## 2012-07-25 SURGERY — PHACOEMULSIFICATION, CATARACT, WITH IOL INSERTION
Anesthesia: Monitor Anesthesia Care | Site: Eye | Laterality: Right | Wound class: Clean

## 2012-07-25 MED ORDER — PHENYLEPHRINE HCL 2.5 % OP SOLN
1.0000 [drp] | OPHTHALMIC | Status: AC
  Administered 2012-07-25 (×3): 1 [drp] via OPHTHALMIC

## 2012-07-25 MED ORDER — ONDANSETRON HCL 4 MG/2ML IJ SOLN: 4.0000 mg | Freq: Once | INTRAMUSCULAR | Status: DC | PRN

## 2012-07-25 MED ORDER — LIDOCAINE HCL (PF) 1 % IJ SOLN
INTRAOCULAR | Status: DC | PRN
  Administered 2012-07-25: 14:00:00 via OPHTHALMIC

## 2012-07-25 MED ORDER — PHENYLEPHRINE HCL 2.5 % OP SOLN
OPHTHALMIC | Status: AC
  Filled 2012-07-25: qty 2

## 2012-07-25 MED ORDER — EPINEPHRINE HCL 1 MG/ML IJ SOLN
INTRAMUSCULAR | Status: AC
  Filled 2012-07-25: qty 1

## 2012-07-25 MED ORDER — MIDAZOLAM HCL 2 MG/2ML IJ SOLN
1.0000 mg | INTRAMUSCULAR | Status: DC | PRN
  Administered 2012-07-25: 2 mg via INTRAVENOUS

## 2012-07-25 MED ORDER — POVIDONE-IODINE 5 % OP SOLN
OPHTHALMIC | Status: DC | PRN
  Administered 2012-07-25: 1 via OPHTHALMIC

## 2012-07-25 MED ORDER — PROVISC 10 MG/ML IO SOLN
INTRAOCULAR | Status: DC | PRN
  Administered 2012-07-25: 8.5 mg via INTRAOCULAR

## 2012-07-25 MED ORDER — MIDAZOLAM HCL 2 MG/2ML IJ SOLN
INTRAMUSCULAR | Status: AC
  Filled 2012-07-25: qty 2

## 2012-07-25 MED ORDER — LIDOCAINE 3.5 % OP GEL OPTIME - NO CHARGE
OPHTHALMIC | Status: DC | PRN
  Administered 2012-07-25: 1 [drp] via OPHTHALMIC

## 2012-07-25 MED ORDER — BSS IO SOLN
INTRAOCULAR | Status: DC | PRN
  Administered 2012-07-25: 15 mL via INTRAOCULAR

## 2012-07-25 MED ORDER — LIDOCAINE HCL 3.5 % OP GEL
OPHTHALMIC | Status: AC
  Filled 2012-07-25: qty 5

## 2012-07-25 MED ORDER — CYCLOPENTOLATE-PHENYLEPHRINE 0.2-1 % OP SOLN
1.0000 [drp] | OPHTHALMIC | Status: AC
  Administered 2012-07-25 (×3): 1 [drp] via OPHTHALMIC

## 2012-07-25 MED ORDER — EPINEPHRINE HCL 1 MG/ML IJ SOLN
INTRAOCULAR | Status: DC | PRN
  Administered 2012-07-25: 14:00:00

## 2012-07-25 MED ORDER — LIDOCAINE HCL 3.5 % OP GEL: 1.0000 "application " | Freq: Once | OPHTHALMIC | Status: DC

## 2012-07-25 MED ORDER — LACTATED RINGERS IV SOLN
INTRAVENOUS | Status: DC
  Administered 2012-07-25: 1000 mL via INTRAVENOUS

## 2012-07-25 MED ORDER — TETRACAINE HCL 0.5 % OP SOLN
1.0000 [drp] | OPHTHALMIC | Status: AC
  Administered 2012-07-25 (×3): 1 [drp] via OPHTHALMIC

## 2012-07-25 MED ORDER — FENTANYL CITRATE 0.05 MG/ML IJ SOLN: 25.0000 ug | INTRAMUSCULAR | Status: DC | PRN

## 2012-07-25 MED ORDER — NEOMYCIN-POLYMYXIN-DEXAMETH 0.1 % OP OINT
TOPICAL_OINTMENT | OPHTHALMIC | Status: DC | PRN
  Administered 2012-07-25: 1 via OPHTHALMIC

## 2012-07-25 SURGICAL SUPPLY — 32 items
CAPSULAR TENSION RING-AMO (OPHTHALMIC RELATED) IMPLANT
CLOTH BEACON ORANGE TIMEOUT ST (SAFETY) ×1 IMPLANT
EYE SHIELD UNIVERSAL CLEAR (GAUZE/BANDAGES/DRESSINGS) ×1 IMPLANT
GLOVE BIO SURGEON STRL SZ 6.5 (GLOVE) IMPLANT
GLOVE BIOGEL PI IND STRL 6.5 (GLOVE) IMPLANT
GLOVE BIOGEL PI IND STRL 7.0 (GLOVE) IMPLANT
GLOVE BIOGEL PI IND STRL 7.5 (GLOVE) IMPLANT
GLOVE BIOGEL PI INDICATOR 6.5 (GLOVE) ×1
GLOVE BIOGEL PI INDICATOR 7.0 (GLOVE)
GLOVE BIOGEL PI INDICATOR 7.5 (GLOVE) ×1
GLOVE ECLIPSE 6.5 STRL STRAW (GLOVE) IMPLANT
GLOVE ECLIPSE 7.0 STRL STRAW (GLOVE) IMPLANT
GLOVE ECLIPSE 7.5 STRL STRAW (GLOVE) IMPLANT
GLOVE EXAM NITRILE LRG STRL (GLOVE) IMPLANT
GLOVE EXAM NITRILE MD LF STRL (GLOVE) ×1 IMPLANT
GLOVE SKINSENSE NS SZ6.5 (GLOVE)
GLOVE SKINSENSE NS SZ7.0 (GLOVE)
GLOVE SKINSENSE STRL SZ6.5 (GLOVE) IMPLANT
GLOVE SKINSENSE STRL SZ7.0 (GLOVE) IMPLANT
KIT VITRECTOMY (OPHTHALMIC RELATED) IMPLANT
PAD ARMBOARD 7.5X6 YLW CONV (MISCELLANEOUS) ×1 IMPLANT
PROC W NO LENS (INTRAOCULAR LENS)
PROC W SPEC LENS (INTRAOCULAR LENS)
PROCESS W NO LENS (INTRAOCULAR LENS) IMPLANT
PROCESS W SPEC LENS (INTRAOCULAR LENS) IMPLANT
RING MALYGIN (MISCELLANEOUS) IMPLANT
SIGHTPATH CAT PROC W REG LENS (Ophthalmic Related) ×2 IMPLANT
SYR TB 1ML LL NO SAFETY (SYRINGE) ×1 IMPLANT
TAPE SURG TRANSPORE 1 IN (GAUZE/BANDAGES/DRESSINGS) IMPLANT
TAPE SURGICAL TRANSPORE 1 IN (GAUZE/BANDAGES/DRESSINGS) ×1
VISCOELASTIC ADDITIONAL (OPHTHALMIC RELATED) IMPLANT
WATER STERILE IRR 250ML POUR (IV SOLUTION) ×1 IMPLANT

## 2012-07-25 NOTE — Anesthesia Preprocedure Evaluation (Addendum)
Anesthesia Evaluation  Patient identified by MRN, date of birth, ID band Patient awake    Reviewed: Allergy & Precautions, H&P , NPO status , Patient's Chart, lab work & pertinent test results, reviewed documented beta blocker date and time   Airway Mallampati: II TM Distance: >3 FB Neck ROM: Full    Dental  (+) Teeth Intact   Pulmonary shortness of breath,    Pulmonary exam normal       Cardiovascular hypertension, Pt. on medications and Pt. on home beta blockers + CABG + dysrhythmias Rhythm:Regular Rate:Normal     Neuro/Psych  Headaches, PSYCHIATRIC DISORDERS    GI/Hepatic Neg liver ROS, GERD-  Medicated and Controlled,  Endo/Other  negative endocrine ROS  Renal/GU negative Renal ROS     Musculoskeletal   Abdominal Normal abdominal exam  (+)   Peds  Hematology negative hematology ROS (+)   Anesthesia Other Findings   Reproductive/Obstetrics                          Anesthesia Physical Anesthesia Plan  ASA: III  Anesthesia Plan: MAC   Post-op Pain Management:    Induction: Intravenous  Airway Management Planned: Nasal Cannula  Additional Equipment:   Intra-op Plan:   Post-operative Plan:   Informed Consent: I have reviewed the patients History and Physical, chart, labs and discussed the procedure including the risks, benefits and alternatives for the proposed anesthesia with the patient or authorized representative who has indicated his/her understanding and acceptance.     Plan Discussed with: CRNA  Anesthesia Plan Comments:         Anesthesia Quick Evaluation

## 2012-07-25 NOTE — H&P (Signed)
I have reviewed the H&P, the patient was re-examined, and I have identified no interval changes in medical condition and plan of care since the history and physical of record  

## 2012-07-25 NOTE — Brief Op Note (Signed)
Pre-Op Dx: Cataract OD Post-Op Dx: Cataract OD Surgeon: Devlon Dosher Anesthesia: Topical with MAC Surgery: Cataract Extraction with Intraocular lens Implant OD Implant: B&L enVista Specimen: None Complications: None 

## 2012-07-25 NOTE — Anesthesia Postprocedure Evaluation (Signed)
  Anesthesia Post-op Note  Patient: Jorge Bishop  Procedure(s) Performed: Procedure(s) with comments: CATARACT EXTRACTION PHACO AND INTRAOCULAR LENS PLACEMENT (IOC) (Right) - CDE:23.31  Patient Location: Short Stay  Anesthesia Type:MAC  Level of Consciousness: awake, alert , oriented and patient cooperative  Airway and Oxygen Therapy: Patient Spontanous Breathing  Post-op Pain: none  Post-op Assessment: Post-op Vital signs reviewed, Patient's Cardiovascular Status Stable, Respiratory Function Stable, Patent Airway, No signs of Nausea or vomiting and Pain level controlled  Post-op Vital Signs: Reviewed and stable  Complications: No apparent anesthesia complications

## 2012-07-25 NOTE — Transfer of Care (Signed)
Immediate Anesthesia Transfer of Care Note  Patient: Jorge Bishop  Procedure(s) Performed: Procedure(s) with comments: CATARACT EXTRACTION PHACO AND INTRAOCULAR LENS PLACEMENT (IOC) (Right) - CDE:23.31  Patient Location: Short Stay  Anesthesia Type:MAC  Level of Consciousness: awake, alert , oriented and patient cooperative  Airway & Oxygen Therapy: Patient Spontanous Breathing  Post-op Assessment: Report given to PACU RN, Post -op Vital signs reviewed and stable and Patient moving all extremities  Post vital signs: Reviewed and stable  Complications: No apparent anesthesia complications

## 2012-07-26 ENCOUNTER — Encounter (HOSPITAL_COMMUNITY): Payer: Self-pay | Admitting: Ophthalmology

## 2012-07-26 NOTE — Op Note (Signed)
NAME:  Jorge Bishop, Jorge Bishop NO.:  [id number]  MEDICAL RECORD NO.:  [id number]  LOCATION:  [facility]                          FACILITY:  [facility]  PHYSICIAN:  Susanne Greenhouse, MD       DATE OF BIRTH:  Jun 21, 1935  DATE OF PROCEDURE:  07/25/2012 DATE OF DISCHARGE:  07/25/2012                              OPERATIVE REPORT   PREOPERATIVE DIAGNOSIS:  Nuclear cataract, right eye, diagnosis code 366.16.  POSTOPERATIVE DIAGNOSIS:  Nuclear cataract, right eye, diagnosis code 366.16.  OPERATION PERFORMED:  Phacoemulsification with posterior chamber intraocular lens implantation, right eye.  SURGEON:  Bonne Dolores. Tanika Bracco, MD  ANESTHESIA:  Topical with monitored anesthesia care and IV sedation.  OPERATIVE SUMMARY:  In the preoperative area, dilating drops were placed into the right eye.  The patient was then brought into the operating room where she was placed under topical anesthesia and IV sedation.  The eye was then prepped and draped.  Beginning with a 75 blade, a paracentesis port was made at the surgeon's 2 o'clock position.  The anterior chamber was then filled with a 1% nonpreserved lidocaine solution with epinephrine.  This was followed by Viscoat to deepen the chamber.  A small fornix-based peritomy was performed superiorly.  Next, a single iris hook was placed through the limbus superiorly.  A 2.4-mm keratome blade was then used to make a clear corneal incision over the iris hook.  A bent cystotome needle and Utrata forceps were used to create a continuous tear capsulotomy.  Hydrodissection was performed using balanced salt solution on a fine cannula.  The lens nucleus was then removed using phacoemulsification in a quadrant cracking technique. The cortical material was then removed with irrigation and aspiration. The capsular bag and anterior chamber were refilled with Provisc.  The wound was widened to approximately 3 mm and a posterior chamber intraocular lens was placed into  the capsular bag without difficulty using an [employer] lens injecting system.  A single 10-0 nylon suture was then used to close the incision as well as stromal hydration. The Provisc was removed from the anterior chamber and capsular bag with irrigation and aspiration.  At this point, the wounds were tested for leak, which were negative.  The anterior chamber remained deep and stable.  The patient tolerated the procedure well.  There were no operative complications, and she awoke from topical anesthesia and IV sedation without problem.  No surgical specimens.  Prosthetic device used is a Theme park manager, model EnVista, model number MX60, power of 21.5, serial number is [id number].          ______________________________ Susanne Greenhouse, MD     KEH/MEDQ  D:  07/25/2012  T:  07/26/2012  Job:  [id number]

## 2012-07-26 NOTE — Addendum Note (Signed)
Addendum created 07/26/12 0745 by Franco Nones, CRNA   Modules edited: Charges VN

## 2012-08-15 ENCOUNTER — Encounter (HOSPITAL_COMMUNITY): Payer: Self-pay | Admitting: Pharmacy Technician

## 2012-08-17 ENCOUNTER — Encounter (HOSPITAL_COMMUNITY): Payer: Self-pay

## 2012-08-17 ENCOUNTER — Encounter (HOSPITAL_COMMUNITY)
Admission: RE | Admit: 2012-08-17 | Discharge: 2012-08-17 | Disposition: A | Discharge status: Home / Self Care | Payer: Medicare Other | Source: Ambulatory Visit | Attending: Ophthalmology | Admitting: Ophthalmology

## 2012-08-17 MED ORDER — CYCLOPENTOLATE-PHENYLEPHRINE 0.2-1 % OP SOLN
OPHTHALMIC | Status: AC
  Filled 2012-08-17: qty 2

## 2012-08-17 MED ORDER — NEOMYCIN-POLYMYXIN-DEXAMETH 3.5-10000-0.1 OP OINT
TOPICAL_OINTMENT | OPHTHALMIC | Status: AC
  Filled 2012-08-17: qty 3.5

## 2012-08-17 MED ORDER — PHENYLEPHRINE HCL 2.5 % OP SOLN
OPHTHALMIC | Status: AC
  Filled 2012-08-17: qty 2

## 2012-08-17 MED ORDER — LIDOCAINE HCL (PF) 1 % IJ SOLN
INTRAMUSCULAR | Status: AC
  Filled 2012-08-17: qty 2

## 2012-08-17 MED ORDER — TETRACAINE HCL 0.5 % OP SOLN
OPHTHALMIC | Status: AC
  Filled 2012-08-17: qty 2

## 2012-08-17 MED ORDER — LIDOCAINE HCL 3.5 % OP GEL
OPHTHALMIC | Status: AC
  Filled 2012-08-17: qty 5

## 2012-08-18 ENCOUNTER — Ambulatory Visit (HOSPITAL_COMMUNITY)
Admission: RE | Admit: 2012-08-18 | Discharge: 2012-08-18 | Disposition: A | Discharge status: Home / Self Care | Payer: Medicare Other | Source: Ambulatory Visit | Attending: Ophthalmology | Admitting: Ophthalmology

## 2012-08-18 ENCOUNTER — Encounter (HOSPITAL_COMMUNITY)
Admission: RE | Disposition: A | Discharge status: Home / Self Care | Payer: Self-pay | Source: Ambulatory Visit | Attending: Ophthalmology

## 2012-08-18 ENCOUNTER — Ambulatory Visit (HOSPITAL_COMMUNITY): Payer: Medicare Other | Admitting: Anesthesiology

## 2012-08-18 ENCOUNTER — Encounter (HOSPITAL_COMMUNITY): Payer: Self-pay | Admitting: *Deleted

## 2012-08-18 ENCOUNTER — Encounter (HOSPITAL_COMMUNITY): Payer: Self-pay | Admitting: Anesthesiology

## 2012-08-18 DIAGNOSIS — H2510 Age-related nuclear cataract, unspecified eye: Secondary | ICD-10-CM | POA: Insufficient documentation

## 2012-08-18 DIAGNOSIS — I10 Essential (primary) hypertension: Secondary | ICD-10-CM | POA: Insufficient documentation

## 2012-08-18 DIAGNOSIS — Z951 Presence of aortocoronary bypass graft: Secondary | ICD-10-CM | POA: Insufficient documentation

## 2012-08-18 HISTORY — PX: CATARACT EXTRACTION W/PHACO: SHX586

## 2012-08-18 SURGERY — PHACOEMULSIFICATION, CATARACT, WITH IOL INSERTION
Anesthesia: Monitor Anesthesia Care | Site: Eye | Laterality: Left | Wound class: Clean

## 2012-08-18 MED ORDER — EPINEPHRINE HCL 1 MG/ML IJ SOLN
INTRAMUSCULAR | Status: AC
  Filled 2012-08-18: qty 1

## 2012-08-18 MED ORDER — TETRACAINE HCL 0.5 % OP SOLN
1.0000 [drp] | OPHTHALMIC | Status: AC
  Administered 2012-08-18 (×3): 1 [drp] via OPHTHALMIC

## 2012-08-18 MED ORDER — LIDOCAINE 3.5 % OP GEL OPTIME - NO CHARGE
OPHTHALMIC | Status: DC | PRN
  Administered 2012-08-18: 1 [drp] via OPHTHALMIC

## 2012-08-18 MED ORDER — PHENYLEPHRINE HCL 2.5 % OP SOLN
1.0000 [drp] | OPHTHALMIC | Status: AC
  Administered 2012-08-18 (×3): 1 [drp] via OPHTHALMIC

## 2012-08-18 MED ORDER — POVIDONE-IODINE 5 % OP SOLN
OPHTHALMIC | Status: DC | PRN
  Administered 2012-08-18: 1 via OPHTHALMIC

## 2012-08-18 MED ORDER — MIDAZOLAM HCL 2 MG/2ML IJ SOLN
1.0000 mg | INTRAMUSCULAR | Status: DC | PRN
  Administered 2012-08-18: 2 mg via INTRAVENOUS

## 2012-08-18 MED ORDER — NEOMYCIN-POLYMYXIN-DEXAMETH 0.1 % OP OINT
TOPICAL_OINTMENT | OPHTHALMIC | Status: DC | PRN
  Administered 2012-08-18: 1 via OPHTHALMIC

## 2012-08-18 MED ORDER — LIDOCAINE HCL 3.5 % OP GEL
1.0000 "application " | Freq: Once | OPHTHALMIC | Status: AC
  Administered 2012-08-18: 1 via OPHTHALMIC

## 2012-08-18 MED ORDER — BSS IO SOLN
INTRAOCULAR | Status: DC | PRN
  Administered 2012-08-18: 15 mL via INTRAOCULAR

## 2012-08-18 MED ORDER — MIDAZOLAM HCL 2 MG/2ML IJ SOLN
INTRAMUSCULAR | Status: AC
  Filled 2012-08-18: qty 2

## 2012-08-18 MED ORDER — LACTATED RINGERS IV SOLN
INTRAVENOUS | Status: DC
  Administered 2012-08-18: 10:00:00 via INTRAVENOUS

## 2012-08-18 MED ORDER — LIDOCAINE HCL (PF) 1 % IJ SOLN
INTRAOCULAR | Status: DC | PRN
  Administered 2012-08-18: 10:00:00 via OPHTHALMIC

## 2012-08-18 MED ORDER — EPINEPHRINE HCL 1 MG/ML IJ SOLN
INTRAOCULAR | Status: DC | PRN
  Administered 2012-08-18: 10:00:00

## 2012-08-18 MED ORDER — PROVISC 10 MG/ML IO SOLN
INTRAOCULAR | Status: DC | PRN
  Administered 2012-08-18: 8.5 mg via INTRAOCULAR

## 2012-08-18 MED ORDER — CYCLOPENTOLATE-PHENYLEPHRINE 0.2-1 % OP SOLN
1.0000 [drp] | OPHTHALMIC | Status: AC
  Administered 2012-08-18 (×3): 1 [drp] via OPHTHALMIC

## 2012-08-18 SURGICAL SUPPLY — 32 items
CAPSULAR TENSION RING-AMO (OPHTHALMIC RELATED) IMPLANT
CLOTH BEACON ORANGE TIMEOUT ST (SAFETY) ×1 IMPLANT
EYE SHIELD UNIVERSAL CLEAR (GAUZE/BANDAGES/DRESSINGS) ×1 IMPLANT
GLOVE BIO SURGEON STRL SZ 6.5 (GLOVE) IMPLANT
GLOVE BIOGEL PI IND STRL 6.5 (GLOVE) IMPLANT
GLOVE BIOGEL PI IND STRL 7.0 (GLOVE) IMPLANT
GLOVE BIOGEL PI IND STRL 7.5 (GLOVE) IMPLANT
GLOVE BIOGEL PI INDICATOR 6.5 (GLOVE) ×1
GLOVE BIOGEL PI INDICATOR 7.0 (GLOVE)
GLOVE BIOGEL PI INDICATOR 7.5 (GLOVE)
GLOVE ECLIPSE 6.5 STRL STRAW (GLOVE) IMPLANT
GLOVE ECLIPSE 7.0 STRL STRAW (GLOVE) IMPLANT
GLOVE ECLIPSE 7.5 STRL STRAW (GLOVE) IMPLANT
GLOVE EXAM NITRILE LRG STRL (GLOVE) ×1 IMPLANT
GLOVE EXAM NITRILE MD LF STRL (GLOVE) IMPLANT
GLOVE SKINSENSE NS SZ6.5 (GLOVE)
GLOVE SKINSENSE NS SZ7.0 (GLOVE)
GLOVE SKINSENSE STRL SZ6.5 (GLOVE) IMPLANT
GLOVE SKINSENSE STRL SZ7.0 (GLOVE) IMPLANT
KIT VITRECTOMY (OPHTHALMIC RELATED) IMPLANT
PAD ARMBOARD 7.5X6 YLW CONV (MISCELLANEOUS) ×1 IMPLANT
PROC W NO LENS (INTRAOCULAR LENS)
PROC W SPEC LENS (INTRAOCULAR LENS)
PROCESS W NO LENS (INTRAOCULAR LENS) IMPLANT
PROCESS W SPEC LENS (INTRAOCULAR LENS) IMPLANT
RING MALYGIN (MISCELLANEOUS) IMPLANT
SIGHTPATH CAT PROC W REG LENS (Ophthalmic Related) ×2 IMPLANT
SYR TB 1ML LL NO SAFETY (SYRINGE) ×1 IMPLANT
TAPE SURG TRANSPORE 1 IN (GAUZE/BANDAGES/DRESSINGS) IMPLANT
TAPE SURGICAL TRANSPORE 1 IN (GAUZE/BANDAGES/DRESSINGS) ×1
VISCOELASTIC ADDITIONAL (OPHTHALMIC RELATED) IMPLANT
WATER STERILE IRR 250ML POUR (IV SOLUTION) ×1 IMPLANT

## 2012-08-18 NOTE — Anesthesia Preprocedure Evaluation (Addendum)
Anesthesia Evaluation  Patient identified by MRN, date of birth, ID band Patient awake    Reviewed: Allergy & Precautions, H&P , NPO status , Patient's Chart, lab work & pertinent test results, reviewed documented beta blocker date and time   Airway Mallampati: II TM Distance: >3 FB Neck ROM: Full    Dental  (+) Teeth Intact   Pulmonary shortness of breath,    Pulmonary exam normal       Cardiovascular hypertension, Pt. on medications and Pt. on home beta blockers + CABG + dysrhythmias Rhythm:Regular Rate:Normal     Neuro/Psych  Headaches, PSYCHIATRIC DISORDERS    GI/Hepatic Neg liver ROS, GERD-  Medicated and Controlled,  Endo/Other  negative endocrine ROS  Renal/GU negative Renal ROS     Musculoskeletal   Abdominal Normal abdominal exam  (+)   Peds  Hematology negative hematology ROS (+)   Anesthesia Other Findings   Reproductive/Obstetrics                          Anesthesia Physical Anesthesia Plan  ASA: III  Anesthesia Plan: MAC   Post-op Pain Management:    Induction: Intravenous  Airway Management Planned: Nasal Cannula  Additional Equipment:   Intra-op Plan:   Post-operative Plan:   Informed Consent: I have reviewed the patients History and Physical, chart, labs and discussed the procedure including the risks, benefits and alternatives for the proposed anesthesia with the patient or authorized representative who has indicated his/her understanding and acceptance.     Plan Discussed with: CRNA  Anesthesia Plan Comments:         Anesthesia Quick Evaluation  

## 2012-08-18 NOTE — Anesthesia Procedure Notes (Signed)
Procedure Name: MAC Date/Time: 08/18/2012 10:00 AM Performed by: Franco Nones Pre-anesthesia Checklist: Patient identified, Emergency Drugs available, Suction available, Timeout performed and Patient being monitored Patient Re-evaluated:Patient Re-evaluated prior to inductionOxygen Delivery Method: Nasal Cannula

## 2012-08-18 NOTE — H&P (Signed)
I have reviewed the H&P, the patient was re-examined, and I have identified no interval changes in medical condition and plan of care since the history and physical of record  

## 2012-08-18 NOTE — Anesthesia Postprocedure Evaluation (Signed)
  Anesthesia Post-op Note  Patient: Jorge Bishop  Procedure(s) Performed: Procedure(s) (LRB): CATARACT EXTRACTION PHACO AND INTRAOCULAR LENS PLACEMENT (IOC) (Left)  Patient Location:  Short Stay  Anesthesia Type: MAC  Level of Consciousness: awake  Airway and Oxygen Therapy: Patient Spontanous Breathing  Post-op Pain: none  Post-op Assessment: Post-op Vital signs reviewed, Patient's Cardiovascular Status Stable, Respiratory Function Stable, Patent Airway, No signs of Nausea or vomiting and Pain level controlled  Post-op Vital Signs: Reviewed and stable  Complications: No apparent anesthesia complications

## 2012-08-18 NOTE — Transfer of Care (Addendum)
Immediate Anesthesia Transfer of Care Note  Patient: Jorge Bishop  Procedure(s) Performed: Procedure(s) (LRB): CATARACT EXTRACTION PHACO AND INTRAOCULAR LENS PLACEMENT (IOC) (Left)  Patient Location: Shortstay  Anesthesia Type: MAC  Level of Consciousness: awake  Airway & Oxygen Therapy: Patient Spontanous Breathing   Post-op Assessment: Report given to PACU RN, Post -op Vital signs reviewed and stable and Patient moving all extremities  Post vital signs: Reviewed and stable  Complications: No apparent anesthesia complications

## 2012-08-18 NOTE — Brief Op Note (Signed)
Pre-Op Dx: Cataract OS Post-Op Dx: Cataract OS Surgeon: Hunt, Kerry Anesthesia: Topical with MAC Surgery: Cataract Extraction with Intraocular lens Implant OS Implant: B&L enVista Specimen: None Complications: None 

## 2012-08-19 NOTE — Op Note (Signed)
NAME:  Jorge Bishop, Jorge Bishop NO.:  [id number]  MEDICAL RECORD NO.:  [id number]  LOCATION:  [facility]                          FACILITY:  [facility]  PHYSICIAN:  Susanne Greenhouse, MD       DATE OF BIRTH:  1935/08/27  DATE OF PROCEDURE:  08/18/2012 DATE OF DISCHARGE:  08/18/2012                              OPERATIVE REPORT   PREOPERATIVE DIAGNOSIS:  Nuclear cataract, left eye, diagnosis code 366.16.  POSTOPERATIVE DIAGNOSIS:  Nuclear cataract, left eye, diagnosis code 366.16.  ANESTHESIA:  Topical with monitored anesthesia care and IV sedation.  SURGEON:  Bonne Dolores. Hunt, MD  OPERATION PERFORMED:  Phacoemulsification of posterior chamber intraocular lens implantation, left eye.  OPERATIVE SUMMARY:  In the preoperative area, dilating drops were placed into the left eye.  The patient was then brought into the operating room where he was placed under topical anesthesia and IV sedation.  The eye was then prepped and draped.  Beginning with a 75 blade, a paracentesis port was made at the surgeon's 2 o'clock position.  The anterior chamber was then filled with a 1% nonpreserved lidocaine solution with epinephrine.  This was followed by Viscoat to deepen the chamber.  A small fornix-based peritomy was performed superiorly.  Next, a single iris hook was placed through the limbus superiorly.  A 2.4-mm keratome blade was then used to make a clear corneal incision over the iris hook. A bent cystotome needle and Utrata forceps were used to create a continuous tear capsulotomy.  Hydrodissection was performed using balanced salt solution on a fine cannula.  The lens nucleus was then removed using phacoemulsification in a quadrant cracking technique.  The cortical material was then removed with irrigation and aspiration.  The capsular bag and anterior chamber were refilled with Provisc.  The wound was widened to approximately 3 mm and a posterior chamber intraocular lens was placed into the  capsular bag without difficulty using an [employer] lens injecting system.  A single 10-0 nylon suture was then used to close the incision as well as stromal hydration.  The Provisc was removed from the anterior chamber and capsular bag with irrigation and aspiration.  At this point, the wounds were tested for leak, which were negative.  The anterior chamber remained deep and stable.  The patient tolerated the procedure well.  There were no operative complications, and he awoke from topical anesthesia and IV sedation without problem.  No surgical specimens.  Prosthetic device used is [employer] Lomb, posterior chamber lens, model EnVista, model# MX60, power of 22.5, serial number is [id number].          ______________________________ Susanne Greenhouse, MD     KEH/MEDQ  D:  08/18/2012  T:  08/19/2012  Job:  [id number]

## 2012-08-22 ENCOUNTER — Encounter (HOSPITAL_COMMUNITY): Payer: Self-pay | Admitting: Ophthalmology

## 2012-10-26 ENCOUNTER — Encounter: Payer: Self-pay | Admitting: Cardiology

## 2012-10-26 ENCOUNTER — Ambulatory Visit (INDEPENDENT_AMBULATORY_CARE_PROVIDER_SITE_OTHER): Payer: Medicare Other | Admitting: Cardiology

## 2012-10-26 VITALS — BP 120/74 | HR 66 | Ht 72.0 in | Wt 192.0 lb

## 2012-10-26 DIAGNOSIS — E785 Hyperlipidemia, unspecified: Secondary | ICD-10-CM

## 2012-10-26 DIAGNOSIS — I2510 Atherosclerotic heart disease of native coronary artery without angina pectoris: Secondary | ICD-10-CM

## 2012-10-26 DIAGNOSIS — I7090 Unspecified atherosclerosis: Secondary | ICD-10-CM

## 2012-10-26 DIAGNOSIS — I10 Essential (primary) hypertension: Secondary | ICD-10-CM

## 2012-10-26 DIAGNOSIS — D696 Thrombocytopenia, unspecified: Secondary | ICD-10-CM

## 2012-10-26 NOTE — Progress Notes (Deleted)
Name: Jorge Bishop    DOB: 1935-12-30  Age: 77 y.o.  MR#: [id number]       PCP:  Isabella Stalling, MD      Insurance: Payor: MEDICARE / Plan: MEDICARE PART A AND B / Product Type: *No Product type* /   CC:   No chief complaint on file. no list Pt stated he had a sonogram per [facility] office, called office and left message to call our office to retrieve record  VS Filed Vitals:   10/26/12 1344  BP: 120/74  Pulse: 66  Height: 6' (1.829 m)  Weight: 192 lb (87.091 kg)  SpO2: 98%    Weights Current Weight  10/26/12 192 lb (87.091 kg)  08/18/12 198 lb (89.812 kg)  08/18/12 198 lb (89.812 kg)    Blood Pressure  BP Readings from Last 3 Encounters:  10/26/12 120/74  08/18/12 102/57  08/18/12 102/57     Admit date:  (Not on file) Last encounter with RMR:  Visit date not found   Allergy Bactrim; Clarithromycin; Codeine; Erythromycin; and Zithromax  Current Outpatient Prescriptions  Medication Sig Dispense Refill  . aspirin (ASPIRIN LOW DOSE) 81 MG tablet Take 81 mg by mouth daily.        Marland Kitchen atorvastatin (LIPITOR) 20 MG tablet Take 20 mg by mouth at bedtime.        Marland Kitchen ezetimibe (ZETIA) 10 MG tablet Take 10 mg by mouth daily.      Marland Kitchen labetalol (NORMODYNE) 200 MG tablet Take 200 mg by mouth 2 (two) times daily.        . niacin (NIASPAN) 1000 MG CR tablet Take 1,000 mg by mouth at bedtime.       . niacin (NIASPAN) 500 MG CR tablet Take 500 mg by mouth every morning.       Marland Kitchen omeprazole (PRILOSEC) 20 MG capsule Take 20 mg by mouth 2 (two) times daily.       Marland Kitchen OVER THE COUNTER MEDICATION Place 1 drop into both eyes daily. Restore eye drops.      . Tamsulosin HCl (FLOMAX) 0.4 MG CAPS Take 0.4 mg by mouth daily.       No current facility-administered medications for this visit.    Discontinued Meds:   There are no discontinued medications.  Patient Active Problem List   Diagnosis Date Noted  . Hyperlipidemia   . Cerebrovascular disease   . Thrombocytopenia   . Erectile  dysfunction   . Migraine headache   . Upper gastrointestinal hemorrhage   . Arteriosclerotic cardiovascular disease (ASCVD)   . HYPERTENSION 01/16/2009  . BENIGN PROSTATIC HYPERTROPHY, HX OF 01/16/2009    LABS    Component Value Date/Time   NA 136 07/21/2012 1300   NA 136 08/01/2010 1437   NA 140 04/09/2009   K 4.2 07/21/2012 1300   K 4.3 08/01/2010 1437   K 4.2 04/09/2009   CL 99 07/21/2012 1300   CL 103 08/01/2010 1437   CL 104 04/09/2009   CO2 30 07/21/2012 1300   CO2 27 08/01/2010 1437   CO2 25 04/09/2009   GLUCOSE 157* 07/21/2012 1300   GLUCOSE 139* 08/01/2010 1437   GLUCOSE 116 04/09/2009   BUN 13 07/21/2012 1300   BUN 18 08/01/2010 1437   BUN 17 04/09/2009   CREATININE 0.91 07/21/2012 1300   CREATININE 0.96 08/01/2010 1437   CREATININE 0.93 04/09/2009   CALCIUM 10.1 07/21/2012 1300   CALCIUM 9.3 08/01/2010 1437   CALCIUM 9.7 04/09/2009   GFRNONAA  81* 07/21/2012 1300   GFRNONAA >60 08/01/2010 1437   GFRNONAA >60 03/29/2008 0515   GFRAA >90 07/21/2012 1300   GFRAA  Value: >60        The eGFR has been calculated using the MDRD equation. This calculation has not been validated in all clinical situations. eGFR's persistently <60 mL/min signify possible Chronic Kidney Disease. 08/01/2010 1437   GFRAA  Value: >60        The eGFR has been calculated using the MDRD equation. This calculation has not been validated in all clinical 03/29/2008 0515   CMP     Component Value Date/Time   NA 136 07/21/2012 1300   K 4.2 07/21/2012 1300   CL 99 07/21/2012 1300   CO2 30 07/21/2012 1300   GLUCOSE 157* 07/21/2012 1300   BUN 13 07/21/2012 1300   CREATININE 0.91 07/21/2012 1300   CALCIUM 10.1 07/21/2012 1300   PROT 7.3 01/06/2010   ALBUMIN 4.4 01/06/2010   AST 25 01/06/2010   ALT 30 01/06/2010   ALKPHOS 57 01/06/2010   BILITOT 1.2 03/21/2008 0120   GFRNONAA 81* 07/21/2012 1300   GFRAA >90 07/21/2012 1300       Component Value Date/Time   WBC 4.7 08/01/2010 1437   WBC 4.8 06/04/2009 0027   WBC 5.7 05/08/2009 2219   HGB  15.2 07/21/2012 1300   HGB 14.3 08/01/2010 1437   HGB 15.5 06/04/2009 0027   HCT 45.6 07/21/2012 1300   HCT 44.1 08/01/2010 1437   HCT 46.1 06/04/2009 0027   MCV 91.9 08/01/2010 1437   MCV 91.1 06/04/2009 0027   MCV 92.1 05/08/2009 2219    Lipid Panel     Component Value Date/Time   CHOL 104 01/06/2010   TRIG 91 01/06/2010   HDL 36 01/06/2010   LDLCALC 50 01/06/2010    ABG No results found for this basename: phart, pco2, pco2art, po2, po2art, hco3, tco2, acidbasedef, o2sat     Lab Results  Component Value Date   TSH 2.410 04/09/2009   BNP (last 3 results) No results found for this basename: PROBNP,  in the last 8760 hours Cardiac Panel (last 3 results) No results found for this basename: CKTOTAL, CKMB, TROPONINI, RELINDX,  in the last 72 hours  Iron/TIBC/Ferritin No results found for this basename: iron, tibc, ferritin     EKG Orders placed during the hospital encounter of 07/25/12  . EKG     Prior Assessment and Plan Problem List as of 10/26/2012   HYPERTENSION   Last Assessment & Plan   10/27/2011 Office Visit Written 10/27/2011  5:13 PM by Kathlen Brunswick, MD     Blood pressure control has been excellent over the past 3 years.  Current therapy will be continued.    BENIGN PROSTATIC HYPERTROPHY, HX OF   Hyperlipidemia   Last Assessment & Plan   10/27/2011 Office Visit Written 10/27/2011  5:16 PM by Kathlen Brunswick, MD     Serum lipid levels are quite low.  Current therapy is adequate.    Cerebrovascular disease   Last Assessment & Plan   04/20/2011 Office Visit Edited 04/21/2011  5:09 PM by Kathlen Brunswick, MD     Dr. Janna Arch is following this problem and arranging for serial noninvasive testing.  The relatively low risk associated with 60% internal carotid artery stenosis was discussed with [name].  He wonders whether additional medical therapy should be provided to him, but I explained that the risk-benefit ratio does not favor addition  of antithrombotic agents  other than aspirin.    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  Continued monitoring is appropriate.    Erectile dysfunction   Migraine headache   Upper gastrointestinal hemorrhage   Arteriosclerotic cardiovascular disease (ASCVD)   Last Assessment & Plan   10/27/2011 Office Visit Written 10/27/2011  5:07 PM by Kathlen Brunswick, MD     Patient is now doing well following CABG surgery in 1998 and 3 interventions in the SVG to the first marginal with subsequent total obstruction.  Stress echo in 04/2011 revealed mild ischemia in the circumflex territory with normal EF.  In the absence of significant symptoms, current medical therapy appears adequate with optimal control of cardiovascular risk factors.  Patient is advised to remain as active as possible but not to continue exercising should chest discomfort or dyspnea occur.        Imaging: No results found.

## 2012-10-26 NOTE — Patient Instructions (Addendum)
Your physician recommends that you schedule a follow-up appointment in: ONE YEAR 

## 2012-10-26 NOTE — Assessment & Plan Note (Signed)
Patient has chronic class II dyspnea on exertion, possibly related to known total occlusion of the circumflex with ischemia in that vascular distribution.  Current functional level is very acceptable to patient. Percutaneous intervention unlikely to be successful, and a second CABG surgical procedure is clearly not warranted.

## 2012-10-26 NOTE — Assessment & Plan Note (Addendum)
Blood pressure control has been excellent for years. Current medication will be continued.

## 2012-11-15 NOTE — Progress Notes (Signed)
Patient ID: Jorge Bishop, male   DOB: 09/01/1935, 77 y.o.   MRN: [id number]  HPI: Schedule return visit for continuing assessment and treatment of coronary artery disease. Patient remains asymptomatic from a cardiovascular standpoint. He is active and denies any new medical problems.  Current Outpatient Prescriptions  Medication Sig Dispense Refill  . aspirin (ASPIRIN LOW DOSE) 81 MG tablet Take 81 mg by mouth daily.        Marland Kitchen atorvastatin (LIPITOR) 20 MG tablet Take 20 mg by mouth at bedtime.        Marland Kitchen ezetimibe (ZETIA) 10 MG tablet Take 10 mg by mouth daily.      Marland Kitchen labetalol (NORMODYNE) 200 MG tablet Take 200 mg by mouth 2 (two) times daily.        . niacin (NIASPAN) 1000 MG CR tablet Take 1,000 mg by mouth at bedtime.       . niacin (NIASPAN) 500 MG CR tablet Take 500 mg by mouth every morning.       Marland Kitchen omeprazole (PRILOSEC) 20 MG capsule Take 20 mg by mouth 2 (two) times daily.       Marland Kitchen OVER THE COUNTER MEDICATION Place 1 drop into both eyes daily. Restore eye drops.      . Tamsulosin HCl (FLOMAX) 0.4 MG CAPS Take 0.4 mg by mouth daily.       No current facility-administered medications for this visit.   Allergies  Allergen Reactions  . Bactrim   . Clarithromycin     REACTION: UNKNOWN REACTION  . Codeine     REACTION: UNKNOWN REACTION  . Erythromycin   . Zithromax (Azithromycin)      Past medical history, social history, and family history reviewed and updated.  ROS: Denies orthopnea, PND, peripheral edema, palpitations, lightheadedness or syncope. All other systems reviewed and are negative.  PHYSICAL EXAM: BP 120/74  Pulse 66  Ht 6' (1.829 m)  Wt 87.091 kg (192 lb)  BMI 26.03 kg/m2  SpO2 98%;  Body mass index is 26.03 kg/(m^2). General-Well developed; no acute distress Body habitus-proportionate weight and height Neck-No JVD; no carotid bruits Lungs-clear lung fields; resonant to percussion Cardiovascular-normal PMI; normal S1 and S2 Abdomen-normal bowel sounds; soft and  non-tender without masses or organomegaly Musculoskeletal-No deformities, no cyanosis or clubbing Neurologic-Normal cranial nerves; symmetric strength and tone Skin-Warm, no significant lesions Extremities-distal pulses intact; no edema  Isle Bing, MD 11/15/2012  10:47 AM  ASSESSMENT AND PLAN

## 2012-11-15 NOTE — Assessment & Plan Note (Signed)
No recent assessment of platelets. Dr. Janna Arch is managing this problem.

## 2012-11-15 NOTE — Assessment & Plan Note (Signed)
Lipid profile fairly well-controlled with moderate dose of atorvastatin. Consideration could be given to a further upwards titration of this drug.

## 2013-01-18 ENCOUNTER — Other Ambulatory Visit: Payer: Self-pay

## 2013-04-05 ENCOUNTER — Telehealth: Payer: Self-pay | Admitting: *Deleted

## 2013-04-05 NOTE — Telephone Encounter (Signed)
Please advise pt is to be scheduled to see Dr. Purvis Sheffield [phone]

## 2013-04-05 NOTE — Telephone Encounter (Signed)
Pt has had cleaning today at dentist and 03/07/13 had a crown done. Pt forgot to notify dentist that he has stents. They need a letter to stick on file of what pre-meds should be. Fax [phone] att Dr Jasmine Awe DDS, PA [facility] phone [phone].Marland Kitchen

## 2013-04-06 NOTE — Telephone Encounter (Signed)
.  left message to have patient return my call. Spoke to dental assistant Darl Pikes to advise results/instructions. Jorge Bishop, requested phone notation be sent to their office, manually faxed to office

## 2013-04-06 NOTE — Telephone Encounter (Signed)
No specific pre-medications required.

## 2013-04-20 ENCOUNTER — Other Ambulatory Visit: Payer: Self-pay

## 2013-11-14 ENCOUNTER — Encounter: Payer: Self-pay | Admitting: Cardiovascular Disease

## 2013-11-14 ENCOUNTER — Ambulatory Visit (INDEPENDENT_AMBULATORY_CARE_PROVIDER_SITE_OTHER): Payer: Medicare Other | Admitting: Cardiovascular Disease

## 2013-11-14 VITALS — BP 118/60 | HR 67 | Ht 72.0 in | Wt 197.0 lb

## 2013-11-14 DIAGNOSIS — E785 Hyperlipidemia, unspecified: Secondary | ICD-10-CM

## 2013-11-14 DIAGNOSIS — I25810 Atherosclerosis of coronary artery bypass graft(s) without angina pectoris: Secondary | ICD-10-CM

## 2013-11-14 DIAGNOSIS — I6523 Occlusion and stenosis of bilateral carotid arteries: Secondary | ICD-10-CM

## 2013-11-14 DIAGNOSIS — I6529 Occlusion and stenosis of unspecified carotid artery: Secondary | ICD-10-CM

## 2013-11-14 DIAGNOSIS — I658 Occlusion and stenosis of other precerebral arteries: Secondary | ICD-10-CM

## 2013-11-14 DIAGNOSIS — I10 Essential (primary) hypertension: Secondary | ICD-10-CM

## 2013-11-14 NOTE — Progress Notes (Signed)
Patient ID: Jorge Bishop, male   DOB: Nov 10, 1935, 78 y.o.   MRN: [id number]      SUBJECTIVE: The patient is a 78 yr old male with a history of CABG in 1998 and subsequent percutaneous coronary intervention. He also has HTN, bilateral carotid artery stenosis, and hyperlipidemia. He has been doing very well and denies chest pain, shortness of breath, palpitations, lightheadedness, dizziness, and leg swelling. He can walk 2-1/2 miles daily without experiencing any symptoms. He does so over 51 minutes. He tells me he had an echocardiogram recently performed at Dr. Denita Lung office.he also said carotid Dopplers were performed there approximately one year ago. He is due to have his lipids checked today. ECG performed in the office today shows normal sinus rhythm with a right bundle branch block.  He volunteers in cardiac rehabilitation.   Allergies  Allergen Reactions  . Bactrim   . Clarithromycin     REACTION: UNKNOWN REACTION  . Codeine     REACTION: UNKNOWN REACTION  . Erythromycin   . Zithromax [Azithromycin]     Current Outpatient Prescriptions  Medication Sig Dispense Refill  . aspirin (ASPIRIN LOW DOSE) 81 MG tablet Take 81 mg by mouth daily.        Marland Kitchen atorvastatin (LIPITOR) 20 MG tablet Take 20 mg by mouth at bedtime.        Marland Kitchen ezetimibe (ZETIA) 10 MG tablet Take 10 mg by mouth daily.      Marland Kitchen labetalol (NORMODYNE) 200 MG tablet Take 200 mg by mouth 2 (two) times daily.        . niacin (NIASPAN) 1000 MG CR tablet Take 1,000 mg by mouth at bedtime.       . niacin (NIASPAN) 500 MG CR tablet Take 500 mg by mouth every morning.       Marland Kitchen omeprazole (PRILOSEC) 20 MG capsule Take 20 mg by mouth 2 (two) times daily.       Marland Kitchen OVER THE COUNTER MEDICATION Place 1 drop into both eyes daily. Restore eye drops.      . Tamsulosin HCl (FLOMAX) 0.4 MG CAPS Take 0.4 mg by mouth daily.       No current facility-administered medications for this visit.    Past Medical History  Diagnosis Date  .  Hyperlipidemia     Lipid profile in 12/2009:104, 91, 36, 50  . Cerebrovascular disease     60% bilateral internal carotid artery stenosis in 2012  . Thrombocytopenia     platelets of 104 in 03/2009  . Erectile dysfunction   . Migraine headache   . Benign prostatic hypertrophy   . Diverticulosis   . DJD (degenerative joint disease)   . Upper gastrointestinal hemorrhage 26/71     Helicobacter pylori positive; subsequent laparoscopic fundoplication-2009  . Arteriosclerotic cardiovascular disease (ASCVD)     Coronary artery bypass graft surgery in 12/98; DES to the RCA SVG in 07/2002; restenosis of the saphenous vein graft stent and renal intervention in 2006; 70% first diagonal present; calf in 11/2007-total obstruction of the saphenous vein graft to the first marginal  . Right bundle branch block   . Shortness of breath   . GERD (gastroesophageal reflux disease)     Past Surgical History  Procedure Laterality Date  . Coronary artery bypass graft  1998   . Laparoscopic nissen fundoplication  2458, 0998     revision in 2009 along with repair of paraesophagel hernia   . Cardiac catheterization      3 cardiac  stents  . Paraesophageal hernia repair    . Hernia repair      right inguinal  . Cataract extraction w/phaco Right 07/25/2012    Procedure: CATARACT EXTRACTION PHACO AND INTRAOCULAR LENS PLACEMENT (IOC);  Surgeon: Tonny Branch, MD;  Location: [facility];  Service: Ophthalmology;  Laterality: Right;  CDE:23.31  . Cataract extraction w/phaco Left 08/18/2012    Procedure: CATARACT EXTRACTION PHACO AND INTRAOCULAR LENS PLACEMENT (IOC);  Surgeon: Tonny Branch, MD;  Location: [facility];  Service: Ophthalmology;  Laterality: Left;  CDE: 16.07    History   Social History  . Marital Status: Married    Spouse Name: N/A    Number of Children: N/A  . Years of Education: N/A   Occupational History  . Full time    Social History Main Topics  . Smoking status: Never Smoker   . Smokeless tobacco: Not  on file  . Alcohol Use: No  . Drug Use: No  . Sexual Activity: Yes    Birth Control/ Protection: None   Other Topics Concern  . Not on file   Social History Narrative  . No narrative on file    BP 118/60  Pulse 67    PHYSICAL EXAM General: NAD Neck: No JVD, no thyromegaly. Lungs: Clear to auscultation bilaterally with normal respiratory effort. CV: Nondisplaced PMI.  Regular rate and rhythm, normal S1/S2, no S3/S4, no murmur. No pretibial or periankle edema.  No carotid bruit.  Normal pedal pulses.  Abdomen: Soft, nontender, no hepatosplenomegaly, no distention.  Neurologic: Alert and oriented x 3.  Psych: Normal affect. Extremities: No clubbing or cyanosis.   ECG: reviewed and available in electronic records.      ASSESSMENT AND PLAN: 1. CAD/CABG with subsequent PCI: Symptomatically stable. Continue aspirin, Lipitor, Zetia, and labetalol.I will obtain the results of his most recent echocardiogram. 2. HTN: Well-controlled on current medical therapy which includes labetalol. 3. Hyperlipidemia: He tells me that his lipids are well controlled. He is due to have a repeat lipid panel completed today.continue Lipitor and Zetia. 4. Bilateral carotid artery stenosis: Followed by PCP. Will obtain most recent carotid Doppler results.  Dispo: f/u 1 year.  Kate Sable, M.D., F.A.C.C.

## 2013-11-14 NOTE — Patient Instructions (Signed)
Your physician wants you to follow-up in: 1 year You will receive a reminder letter in the mail two months in advance. If you don't receive a letter, please call our office to schedule the follow-up appointment.    Your physician recommends that you continue on your current medications as directed. Please refer to the Current Medication list given to you today.     Thank you for choosing  [facility] !  

## 2013-11-21 ENCOUNTER — Encounter: Payer: Self-pay | Admitting: Cardiovascular Disease

## 2014-07-19 ENCOUNTER — Telehealth (INDEPENDENT_AMBULATORY_CARE_PROVIDER_SITE_OTHER): Payer: Self-pay | Admitting: *Deleted

## 2014-07-19 ENCOUNTER — Ambulatory Visit (INDEPENDENT_AMBULATORY_CARE_PROVIDER_SITE_OTHER): Payer: Medicare Other | Admitting: Internal Medicine

## 2014-07-19 ENCOUNTER — Other Ambulatory Visit (INDEPENDENT_AMBULATORY_CARE_PROVIDER_SITE_OTHER): Payer: Self-pay | Admitting: *Deleted

## 2014-07-19 ENCOUNTER — Encounter (INDEPENDENT_AMBULATORY_CARE_PROVIDER_SITE_OTHER): Payer: Self-pay | Admitting: Internal Medicine

## 2014-07-19 VITALS — BP 108/52 | HR 64 | Temp 97.2°F | Ht 72.0 in | Wt 196.2 lb

## 2014-07-19 DIAGNOSIS — R195 Other fecal abnormalities: Secondary | ICD-10-CM

## 2014-07-19 DIAGNOSIS — Z1211 Encounter for screening for malignant neoplasm of colon: Secondary | ICD-10-CM

## 2014-07-19 MED ORDER — PEG-KCL-NACL-NASULF-NA ASC-C 100 G PO SOLR
1.0000 | Freq: Once | ORAL | Status: DC
Start: 2014-07-19 — End: 2014-08-01

## 2014-07-19 NOTE — Patient Instructions (Signed)
Fiber tabs 4 gm daily. Colonoscopy. GI pathogen

## 2014-07-19 NOTE — Progress Notes (Signed)
Subjective:    Patient ID: Jorge Bishop, male    DOB: 23-Aug-1935, 79 y.o.   MRN: [id number]  HPIReferred to our ofice by [facility] for chronic diarrhea. Diarrhea started in the last 18 months. He has had the diarrhea off and on. When diarrhea started, he was not taking antibiotics. When he has diarrhea he will have 5-6 stools. Stools will be either soft or watery. He has urgency. He has taken two rounds of Flagyl for the diarrhea. Flagyl helped for about 6 weeks. He does have constipation at times. He alternates between constipation and diarrhea. Stools are not related to any particular foods.  His last colonoscopy was 8 yrs ago in [place] by Dr Laural Golden (per patient).   Appetite is not good. He has lost 16 pounds over a period of two years. (10/17/2011 weight was 213. Today his weight is 196).  06/25/2014 C-diff negative. OVA and Para negative. Past Hx of C-diff  03/21/2008 EGD: Dr. Oneida Alar (black,tarry stools) FINDINGS: 1. Normal esophagus without evidence of Barrett's, mass, erosion,  ulceration or stricture. 2. 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 The paraesophageal hernia contains many  ulcers. Biopsies were obtained via cold forceps in the  paraesophageal hernia. 3. The diagnostic gastroscope was advanced into the distal portion of  the stomach. Multiple small ulcers seen in the antrum. Biopsies  were obtained via cold forceps in the antrum to evaluate for  ischemia or H. pylori gastritis. 4. Normal duodenal bulb and second portion of the duodenum.    Review of Systems  Married. Two children. One son has hx of MI.    Past Medical History  Diagnosis Date  . Hyperlipidemia     Lipid profile in 12/2009:104, 91, 36, 50  . Cerebrovascular disease     60% bilateral internal carotid artery stenosis in 2012  . Thrombocytopenia     platelets of 104 in 03/2009  . Erectile dysfunction   .  Migraine headache   . Benign prostatic hypertrophy   . Diverticulosis   . DJD (degenerative joint disease)   . Upper gastrointestinal hemorrhage 79/39     Helicobacter pylori positive; subsequent laparoscopic fundoplication-2009  . Arteriosclerotic cardiovascular disease (ASCVD)     Coronary artery bypass graft surgery in 12/98; DES to the RCA SVG in 07/2002; restenosis of the saphenous vein graft stent and renal intervention in 2006; 70% first diagonal present; calf in 11/2007-total obstruction of the saphenous vein graft to the first marginal  . Right bundle branch block   . Shortness of breath   . GERD (gastroesophageal reflux disease)     Past Surgical History  Procedure Laterality Date  . Coronary artery bypass graft  1998   . Laparoscopic nissen fundoplication  0300, 9233     revision in 2009 along with repair of paraesophagel hernia   . Cardiac catheterization      4 cardiac stents  . Paraesophageal hernia repair    . Hernia repair      right inguinal  . Cataract extraction w/phaco Right 07/25/2012    Procedure: CATARACT EXTRACTION PHACO AND INTRAOCULAR LENS PLACEMENT (IOC);  Surgeon: Tonny Branch, MD;  Location: [facility];  Service: Ophthalmology;  Laterality: Right;  CDE:23.31  . Cataract extraction w/phaco Left 08/18/2012    Procedure: CATARACT EXTRACTION PHACO AND INTRAOCULAR LENS PLACEMENT (IOC);  Surgeon: Tonny Branch, MD;  Location: [facility];  Service: Ophthalmology;  Laterality: Left;  CDE: 16.07    Allergies  Allergen Reactions  . Bactrim   . Clarithromycin     REACTION: UNKNOWN REACTION  . Codeine     REACTION: UNKNOWN REACTION  . Erythromycin   . Zithromax [Azithromycin]     Current Outpatient Prescriptions on File Prior to Visit  Medication Sig Dispense Refill  . aspirin (ASPIRIN LOW DOSE) 81 MG tablet Take 81 mg by mouth daily.      Marland Kitchen atorvastatin (LIPITOR) 20 MG tablet Take 20 mg by mouth at bedtime.      Marland Kitchen ezetimibe (ZETIA) 10 MG tablet Take 10 mg by mouth daily.     Marland Kitchen labetalol (NORMODYNE) 200 MG tablet Take 200 mg by mouth 2 (two) times daily.      . niacin (NIASPAN) 1000 MG CR tablet Take 1,000 mg by mouth at bedtime.     . niacin (NIASPAN) 500 MG CR tablet Take 1,500 mg by mouth every morning.     Marland Kitchen omeprazole (PRILOSEC) 20 MG capsule Take 20 mg by mouth as needed.     . Tamsulosin HCl (FLOMAX) 0.4 MG CAPS Take 0.4 mg by mouth daily.    Marland Kitchen OVER THE COUNTER MEDICATION Place 1 drop into both eyes daily. Restore eye drops.     No current facility-administered medications on file prior to visit.        Objective:   Physical Exam  Filed Vitals:   07/19/14 1029  Height: 6' (1.829 m)  Weight: 196 lb 3.2 oz (88.996 kg)    Alert and oriented. Skin warm and dry. Oral mucosa is moist.   . Sclera anicteric, conjunctivae is pink. Thyroid not enlarged. No cervical lymphadenopathy. Lungs clear. Heart regular rate and rhythm.  Abdomen is soft. Bowel sounds are positive. No hepatomegaly. No abdominal masses felt. No tenderness.  No edema to lower extremities.         Assessment & Plan:  Change in stool. Colonic neoplasm needs to be ruled out. Will try to find last colonoscopy report. GI pathogen. (Patient requesting colonoscopy) Fiber tabs 4 gm.

## 2014-07-19 NOTE — Telephone Encounter (Signed)
Patient needs movi prep 

## 2014-07-20 ENCOUNTER — Encounter (INDEPENDENT_AMBULATORY_CARE_PROVIDER_SITE_OTHER): Payer: Self-pay

## 2014-07-24 LAB — GASTROINTESTINAL PATHOGEN PANEL PCR
C. DIFFICILE TOX A/B, PCR: NEGATIVE
Campylobacter, PCR: NEGATIVE
Cryptosporidium, PCR: NEGATIVE
E COLI (STEC) STX1/STX2, PCR: NEGATIVE
E coli (ETEC) LT/ST PCR: NEGATIVE
E coli 0157, PCR: NEGATIVE
Giardia lamblia, PCR: NEGATIVE
Norovirus, PCR: NEGATIVE
Rotavirus A, PCR: NEGATIVE
SALMONELLA, PCR: NEGATIVE
Shigella, PCR: NEGATIVE

## 2014-08-01 ENCOUNTER — Encounter (HOSPITAL_COMMUNITY): Payer: Self-pay | Admitting: *Deleted

## 2014-08-01 ENCOUNTER — Encounter (HOSPITAL_COMMUNITY)
Admission: RE | Disposition: A | Discharge status: Home / Self Care | Payer: Self-pay | Source: Ambulatory Visit | Attending: Internal Medicine

## 2014-08-01 ENCOUNTER — Ambulatory Visit (HOSPITAL_COMMUNITY)
Admission: RE | Admit: 2014-08-01 | Discharge: 2014-08-01 | Disposition: A | Discharge status: Home / Self Care | Payer: Medicare Other | Source: Ambulatory Visit | Attending: Internal Medicine | Admitting: Internal Medicine

## 2014-08-01 DIAGNOSIS — I2510 Atherosclerotic heart disease of native coronary artery without angina pectoris: Secondary | ICD-10-CM | POA: Diagnosis not present

## 2014-08-01 DIAGNOSIS — N400 Enlarged prostate without lower urinary tract symptoms: Secondary | ICD-10-CM | POA: Insufficient documentation

## 2014-08-01 DIAGNOSIS — E785 Hyperlipidemia, unspecified: Secondary | ICD-10-CM | POA: Insufficient documentation

## 2014-08-01 DIAGNOSIS — K5730 Diverticulosis of large intestine without perforation or abscess without bleeding: Secondary | ICD-10-CM

## 2014-08-01 DIAGNOSIS — K6389 Other specified diseases of intestine: Secondary | ICD-10-CM | POA: Diagnosis not present

## 2014-08-01 DIAGNOSIS — K644 Residual hemorrhoidal skin tags: Secondary | ICD-10-CM | POA: Insufficient documentation

## 2014-08-01 DIAGNOSIS — Z79899 Other long term (current) drug therapy: Secondary | ICD-10-CM | POA: Insufficient documentation

## 2014-08-01 DIAGNOSIS — G43909 Migraine, unspecified, not intractable, without status migrainosus: Secondary | ICD-10-CM | POA: Diagnosis not present

## 2014-08-01 DIAGNOSIS — Z7982 Long term (current) use of aspirin: Secondary | ICD-10-CM | POA: Diagnosis not present

## 2014-08-01 DIAGNOSIS — K219 Gastro-esophageal reflux disease without esophagitis: Secondary | ICD-10-CM | POA: Insufficient documentation

## 2014-08-01 DIAGNOSIS — R195 Other fecal abnormalities: Secondary | ICD-10-CM

## 2014-08-01 DIAGNOSIS — K648 Other hemorrhoids: Secondary | ICD-10-CM

## 2014-08-01 DIAGNOSIS — M1990 Unspecified osteoarthritis, unspecified site: Secondary | ICD-10-CM | POA: Insufficient documentation

## 2014-08-01 DIAGNOSIS — Z951 Presence of aortocoronary bypass graft: Secondary | ICD-10-CM | POA: Diagnosis not present

## 2014-08-01 DIAGNOSIS — R197 Diarrhea, unspecified: Secondary | ICD-10-CM | POA: Diagnosis present

## 2014-08-01 HISTORY — PX: COLONOSCOPY: SHX5424

## 2014-08-01 SURGERY — COLONOSCOPY
Anesthesia: Moderate Sedation

## 2014-08-01 MED ORDER — MEPERIDINE HCL 50 MG/ML IJ SOLN
INTRAMUSCULAR | Status: DC | PRN
  Administered 2014-08-01: 25 mg via INTRAVENOUS

## 2014-08-01 MED ORDER — SODIUM CHLORIDE 0.9 % IJ SOLN
INTRAMUSCULAR | Status: AC
  Filled 2014-08-01: qty 42

## 2014-08-01 MED ORDER — MIDAZOLAM HCL 5 MG/5ML IJ SOLN
INTRAMUSCULAR | Status: DC | PRN
  Administered 2014-08-01 (×2): 2 mg via INTRAVENOUS

## 2014-08-01 MED ORDER — MIDAZOLAM HCL 5 MG/5ML IJ SOLN
INTRAMUSCULAR | Status: AC
  Filled 2014-08-01: qty 10

## 2014-08-01 MED ORDER — MEPERIDINE HCL 50 MG/ML IJ SOLN
INTRAMUSCULAR | Status: AC
  Filled 2014-08-01: qty 1

## 2014-08-01 MED ORDER — STERILE WATER FOR IRRIGATION IR SOLN
Status: DC | PRN
  Administered 2014-08-01: 10:00:00

## 2014-08-01 MED ORDER — SODIUM CHLORIDE 0.9 % IV SOLN
INTRAVENOUS | Status: DC
  Administered 2014-08-01: 10:00:00 via INTRAVENOUS

## 2014-08-01 MED ORDER — LOPERAMIDE HCL 2 MG PO TABS: 1.0000 mg | ORAL_TABLET | Freq: Every day | ORAL | Status: DC

## 2014-08-01 NOTE — Discharge Instructions (Signed)
Resume usual medications and diet. Imodium OTC 1 mg by mouth daily at bedtime. No driving for 24 hours. Physician will call with biopsy results.   Colonoscopy, Care After Refer to this sheet in the next few weeks. These instructions provide you with information on caring for yourself after your procedure. Your health care provider may also give you more specific instructions. Your treatment has been planned according to current medical practices, but problems sometimes occur. Call your health care provider if you have any problems or questions after your procedure. WHAT TO EXPECT AFTER THE PROCEDURE  After your procedure, it is typical to have the following:  A small amount of blood in your stool.  Moderate amounts of gas and mild abdominal cramping or bloating. HOME CARE INSTRUCTIONS  Do not drive, operate machinery, or sign important documents for 24 hours.  You may shower and resume your regular physical activities, but move at a slower pace for the first 24 hours.  Take frequent rest periods for the first 24 hours.  Walk around or put a warm pack on your abdomen to help reduce abdominal cramping and bloating.  Drink enough fluids to keep your urine clear or pale yellow.  You may resume your normal diet as instructed by your health care provider. Avoid heavy or fried foods that are hard to digest.  Avoid drinking alcohol for 24 hours or as instructed by your health care provider.  Only take over-the-counter or prescription medicines as directed by your health care provider.  If a tissue sample (biopsy) was taken during your procedure:  Do not take aspirin or blood thinners for 7 days, or as instructed by your health care provider.  Do not drink alcohol for 7 days, or as instructed by your health care provider.  Eat soft foods for the first 24 hours. SEEK MEDICAL CARE IF: You have persistent spotting of blood in your stool 2-3 days after the procedure. SEEK IMMEDIATE  MEDICAL CARE IF:  You have more than a small spotting of blood in your stool.  You pass large blood clots in your stool.  Your abdomen is swollen (distended).  You have nausea or vomiting.  You have a fever.  You have increasing abdominal pain that is not relieved with medicine. Document Released: 01/14/2004 Document Revised: 03/22/2013 Document Reviewed: 02/06/2013 [facility] Patient Information 2015 [place], [place]. This information is not intended to replace advice given to you by your health care provider. Make sure you discuss any questions you have with your health care provider.

## 2014-08-01 NOTE — H&P (Signed)
Jorge Bishop is an 79 y.o. male.   Chief Complaint: Patient is here for colonoscopy. HPI: Patient is 79 year old Caucasian male who presents with 18 month history of diarrhea. Diarrhea days he has 3-4 stools per day. Most of his bowel movements occur before noon. He denies nocturnal diarrhea. He does have some days when he has formed stools occasionally he may get constipated. He denies abdominal pain melena or rectal bleeding. He has not lost any weight since is had diarrhea. He was empirically treated with Flagyl by Dr. Lorriane Shire with did not see any benefit. GI pathogen panel was negative. Last colonoscopy was 10 years ago. Family history is negative for CRC.  Past Medical History  Diagnosis Date  . Hyperlipidemia     Lipid profile in 12/2009:104, 91, 36, 50  . Cerebrovascular disease     60% bilateral internal carotid artery stenosis in 2012  . Thrombocytopenia     platelets of 104 in 03/2009  . Erectile dysfunction   . Migraine headache   . Benign prostatic hypertrophy   . Diverticulosis   . DJD (degenerative joint disease)   . Upper gastrointestinal hemorrhage 50/27     Helicobacter pylori positive; subsequent laparoscopic fundoplication-2009  . Arteriosclerotic cardiovascular disease (ASCVD)     Coronary artery bypass graft surgery in 12/98; DES to the RCA SVG in 07/2002; restenosis of the saphenous vein graft stent and renal intervention in 2006; 70% first diagonal present; calf in 11/2007-total obstruction of the saphenous vein graft to the first marginal  . Right bundle branch block   . Shortness of breath   . GERD (gastroesophageal reflux disease)     Past Surgical History  Procedure Laterality Date  . Coronary artery bypass graft  1998   . Laparoscopic nissen fundoplication  7412, 8786     revision in 2009 along with repair of paraesophagel hernia   . Cardiac catheterization      4 cardiac stents  . Paraesophageal hernia repair    . Hernia repair      right inguinal  .  Cataract extraction w/phaco Right 07/25/2012    Procedure: CATARACT EXTRACTION PHACO AND INTRAOCULAR LENS PLACEMENT (IOC);  Surgeon: Tonny Branch, MD;  Location: [facility];  Service: Ophthalmology;  Laterality: Right;  CDE:23.31  . Cataract extraction w/phaco Left 08/18/2012    Procedure: CATARACT EXTRACTION PHACO AND INTRAOCULAR LENS PLACEMENT (IOC);  Surgeon: Tonny Branch, MD;  Location: [facility];  Service: Ophthalmology;  Laterality: Left;  CDE: 16.07    History reviewed. No pertinent family history. Social History:  reports that he has never smoked. He does not have any smokeless tobacco history on file. He reports that he does not drink alcohol or use illicit drugs.  Allergies:  Allergies  Allergen Reactions  . Bactrim   . Clarithromycin     REACTION: UNKNOWN REACTION  . Codeine     REACTION: UNKNOWN REACTION  . Erythromycin   . Zithromax [Azithromycin]     Medications Prior to Admission  Medication Sig Dispense Refill  . aspirin (ASPIRIN LOW DOSE) 81 MG tablet Take 81 mg by mouth daily.      Marland Kitchen atorvastatin (LIPITOR) 20 MG tablet Take 20 mg by mouth at bedtime.      Marland Kitchen ezetimibe (ZETIA) 10 MG tablet Take 10 mg by mouth daily.    . Fiber, Guar Gum, CHEW Chew 4 g by mouth daily.    Marland Kitchen labetalol (NORMODYNE) 200 MG tablet Take 200 mg by mouth 2 (two) times  daily.      . niacin (NIASPAN) 1000 MG CR tablet Take 1,000 mg by mouth at bedtime.     . niacin (NIASPAN) 500 MG CR tablet Take 500 mg by mouth every morning.     Marland Kitchen omeprazole (PRILOSEC) 20 MG capsule Take 20 mg by mouth daily as needed (acid reflux).     . peg 3350 powder (MOVIPREP) 100 G SOLR Take 1 kit (200 g total) by mouth once. 1 kit 0  . Tamsulosin HCl (FLOMAX) 0.4 MG CAPS Take 0.4 mg by mouth daily.    Marland Kitchen OVER THE COUNTER MEDICATION Place 1 drop into both eyes daily. Restore eye drops.      No results found for this or any previous visit (from the past 48 hour(s)). No results found.  ROS  Blood pressure 134/70, pulse 61,  temperature 97.8 F (36.6 C), temperature source Oral, resp. rate 14, height 6' (1.829 m), weight 196 lb (88.905 kg), SpO2 100 %. Physical Exam  Constitutional: He appears well-developed.  HENT:  Mouth/Throat: Oropharynx is clear and moist.  Eyes: Conjunctivae are normal. No scleral icterus.  Neck: No thyromegaly present.  Cardiovascular: Normal rate, regular rhythm and normal heart sounds.   No murmur heard. Respiratory: Effort normal and breath sounds normal.  GI: Soft. He exhibits no distension and no mass. There is no tenderness.  Musculoskeletal: He exhibits no edema.  Lymphadenopathy:    He has no cervical adenopathy.  Neurological: He is alert.  Skin: Skin is warm and dry.     Assessment/Plan Chronic diarrhea. Diagnostic colonoscopy.  REHMAN,NAJEEB U 08/01/2014, 10:22 AM

## 2014-08-01 NOTE — Op Note (Signed)
COLONOSCOPY PROCEDURE REPORT  PATIENT:  Jorge Bishop  MR#:  [id number] Birthdate:  1936-03-17, 79 y.o., male Endoscopist:  Dr. Rogene Houston, MD Referred By:  Dr. Ralene Bathe. Lorriane Shire, M.D.  Procedure Date: 08/01/2014  Procedure:   Colonoscopy  Indications:  Patient is 53 old Caucasian male with 18 month history of diarrhea. GI pathogen panel was negative and he did not respond to empiric therapy with metronidazole. He is undergoing diagnostic colonoscopy. He has not lost any weight despite his diarrhea.  Informed Consent:  The procedure and risks were reviewed with the patient and informed consent was obtained.  Medications:  Demerol 25 mg IV Versed 4 mg IV  Description of procedure:  After a digital rectal exam was performed, that colonoscope was advanced from the anus through the rectum and colon to the area of the cecum, ileocecal valve and appendiceal orifice. The cecum was deeply intubated. These structures were well-seen and photographed for the record. From the level of the cecum and ileocecal valve, the scope was slowly and cautiously withdrawn. The mucosal surfaces were carefully surveyed utilizing scope tip to flexion to facilitate fold flattening as needed. The scope was pulled down into the rectum where a thorough exam including retroflexion was performed. Terminal ileum was also examined.  Findings:   Prep excellent. Normal mucosa of terminal ileum. Normal mucosa of colon throughout. Few diverticula at sigmoid colon. Random biopsies taken from mucosa of sigmoid colon. Normal rectal mucosa. Small hemorrhoids below the dentate line.   Therapeutic/Diagnostic Maneuvers Performed:  See above  Complications:  None  Cecal Withdrawal Time:  14 minutes  Impression:  Normal mucosa of terminal ileum. Mild sigmoid diverticulosis. No endoscopic evidence of colitis. Random biopsies taken from mucosa of sigmoid colon looking for microscopic colitis. Small external  hemorrhoids.  Recommendations:  Standard instructions given. Imodium OTC 1 mg by mouth daily at bedtime. I will contact patient with biopsy results and further recommendations.  Jaramiah Bossard U  08/01/2014 10:58 AM  CC: Dr. Maricela Curet, MD & Dr. Rayne Du ref. provider found

## 2014-08-02 ENCOUNTER — Encounter (HOSPITAL_COMMUNITY): Payer: Self-pay | Admitting: Internal Medicine

## 2014-08-17 ENCOUNTER — Encounter (INDEPENDENT_AMBULATORY_CARE_PROVIDER_SITE_OTHER): Payer: Self-pay | Admitting: *Deleted

## 2014-10-22 ENCOUNTER — Encounter (INDEPENDENT_AMBULATORY_CARE_PROVIDER_SITE_OTHER): Payer: Self-pay | Admitting: Internal Medicine

## 2014-10-22 ENCOUNTER — Ambulatory Visit (INDEPENDENT_AMBULATORY_CARE_PROVIDER_SITE_OTHER): Payer: Medicare Other | Admitting: Internal Medicine

## 2014-10-22 VITALS — BP 100/72 | HR 72 | Temp 97.8°F | Ht 72.0 in | Wt 193.7 lb

## 2014-10-22 DIAGNOSIS — R197 Diarrhea, unspecified: Secondary | ICD-10-CM | POA: Diagnosis not present

## 2014-10-22 NOTE — Progress Notes (Signed)
Subjective:    Patient ID: Jorge Bishop, male    DOB: 18-Aug-1935, 79 y.o.   MRN: [id number]  HPI Here today for f/u after colonoscopy in February for diarrhea. He tell me he feels better. Colonoscopy in February was negative for microscopic colitis. Stools diary shows most of his stools are formed. He is taking Fiber 4 gam daily and Imodium as needed. He does have urgency. Still has some stomach cramps. He takes Imoidum every other day. If he takes daily he will become costipated. Appetite is good.  He has had about a 2 pound weight loss.  GI pathogen prior negative negative.       08/01/2014 Colonoscopy  Indications: Patient is 66 old Caucasian male with 18 month history of diarrhea. GI pathogen panel was negative and he did not respond to empiric therapy with metronidazole. He is undergoing diagnostic colonoscopy. He has not lost any weight despite his diarrhea.  Impression:  Normal mucosa of terminal ileum. Mild sigmoid diverticulosis. No endoscopic evidence of colitis. Random biopsies taken from mucosa of sigmoid colon looking for microscopic colitis. Small external hemorrhoids.  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 Suspect he has IBS. He became constipated with Imodium and he'll try taking it every other day. Keep stool diary for 2 months and send [place] the summary.     Review of Systems Past Medical History  Diagnosis Date  . Hyperlipidemia     Lipid profile in 12/2009:104, 91, 36, 50  . Cerebrovascular disease     60% bilateral internal carotid artery stenosis in 2012  . Thrombocytopenia     platelets of 104 in 03/2009  . Erectile dysfunction   . Migraine headache   . Benign prostatic hypertrophy   . Diverticulosis   . DJD (degenerative joint disease)   . Upper gastrointestinal hemorrhage 03/50     Helicobacter pylori positive; subsequent laparoscopic fundoplication-2009  . Arteriosclerotic cardiovascular disease (ASCVD)     Coronary artery bypass  graft surgery in 12/98; DES to the RCA SVG in 07/2002; restenosis of the saphenous vein graft stent and renal intervention in 2006; 70% first diagonal present; calf in 11/2007-total obstruction of the saphenous vein graft to the first marginal  . Right bundle branch block   . Shortness of breath   . GERD (gastroesophageal reflux disease)     Past Surgical History  Procedure Laterality Date  . Coronary artery bypass graft  1998   . Laparoscopic nissen fundoplication  0938, 1829     revision in 2009 along with repair of paraesophagel hernia   . Cardiac catheterization      4 cardiac stents  . Paraesophageal hernia repair    . Hernia repair      right inguinal  . Cataract extraction w/phaco Right 07/25/2012    Procedure: CATARACT EXTRACTION PHACO AND INTRAOCULAR LENS PLACEMENT (IOC);  Surgeon: Tonny Branch, MD;  Location: [facility];  Service: Ophthalmology;  Laterality: Right;  CDE:23.31  . Cataract extraction w/phaco Left 08/18/2012    Procedure: CATARACT EXTRACTION PHACO AND INTRAOCULAR LENS PLACEMENT (IOC);  Surgeon: Tonny Branch, MD;  Location: [facility];  Service: Ophthalmology;  Laterality: Left;  CDE: 16.07  . Colonoscopy N/A 08/01/2014    Procedure: COLONOSCOPY;  Surgeon: Rogene Houston, MD;  Location: [facility];  Service: Endoscopy;  Laterality: N/A;  125 - moved to 10:30 - Ann to notify pt    Allergies  Allergen Reactions  . Bactrim   . Clarithromycin  REACTION: UNKNOWN REACTION  . Codeine     REACTION: UNKNOWN REACTION  . Erythromycin   . Zithromax [Azithromycin]     Current Outpatient Prescriptions on File Prior to Visit  Medication Sig Dispense Refill  . aspirin (ASPIRIN LOW DOSE) 81 MG tablet Take 81 mg by mouth daily.      Marland Kitchen atorvastatin (LIPITOR) 20 MG tablet Take 20 mg by mouth at bedtime.      Marland Kitchen ezetimibe (ZETIA) 10 MG tablet Take 10 mg by mouth daily.    . Fiber, Guar Gum, CHEW Chew 4 g by mouth daily.    Marland Kitchen labetalol (NORMODYNE) 200 MG tablet Take 200 mg by mouth 2  (two) times daily.      Marland Kitchen loperamide (IMODIUM A-D) 2 MG tablet Take 0.5 tablets (1 mg total) by mouth at bedtime. 1 tablet 0  . niacin (NIASPAN) 1000 MG CR tablet Take 1,000 mg by mouth at bedtime.     . niacin (NIASPAN) 500 MG CR tablet Take 500 mg by mouth every morning.     Marland Kitchen omeprazole (PRILOSEC) 20 MG capsule Take 20 mg by mouth daily.     Marland Kitchen OVER THE COUNTER MEDICATION Place 1 drop into both eyes daily. Restore eye drops.    . Tamsulosin HCl (FLOMAX) 0.4 MG CAPS Take 0.4 mg by mouth daily.     No current facility-administered medications on file prior to visit.        Objective:   Physical ExamBlood pressure 100/72, pulse 72, temperature 97.8 F (36.6 C), height 6' (1.829 m), weight 193 lb 11.2 oz (87.862 kg). Alert and oriented. Skin warm and dry. Oral mucosa is moist.   . Sclera anicteric, conjunctivae is pink. Thyroid not enlarged. No cervical lymphadenopathy. Lungs clear. Heart regular rate and rhythm.  Abdomen is soft. Bowel sounds are positive. No hepatomegaly. No abdominal masses felt. No tenderness.  No edema to lower extremities.          Assessment & Plan:  Diarrhea which is much better now. Probably IBS Takes Imodium every other day. Fiber 4 gms daily. Samples of IBGARD given to patient,. OV in 6 months.

## 2014-10-22 NOTE — Patient Instructions (Addendum)
Continue the Imodium as needed. Continue the Fibercon OV in 6 months.

## 2014-11-22 ENCOUNTER — Encounter: Payer: Self-pay | Admitting: Cardiovascular Disease

## 2014-11-22 ENCOUNTER — Ambulatory Visit (INDEPENDENT_AMBULATORY_CARE_PROVIDER_SITE_OTHER): Payer: Medicare Other | Admitting: Cardiovascular Disease

## 2014-11-22 ENCOUNTER — Encounter: Payer: Self-pay | Admitting: *Deleted

## 2014-11-22 VITALS — BP 126/64 | HR 73 | Ht 72.0 in | Wt 196.0 lb

## 2014-11-22 DIAGNOSIS — I6523 Occlusion and stenosis of bilateral carotid arteries: Secondary | ICD-10-CM

## 2014-11-22 DIAGNOSIS — I2510 Atherosclerotic heart disease of native coronary artery without angina pectoris: Secondary | ICD-10-CM | POA: Diagnosis not present

## 2014-11-22 DIAGNOSIS — I25708 Atherosclerosis of coronary artery bypass graft(s), unspecified, with other forms of angina pectoris: Secondary | ICD-10-CM

## 2014-11-22 DIAGNOSIS — R0609 Other forms of dyspnea: Secondary | ICD-10-CM | POA: Diagnosis not present

## 2014-11-22 DIAGNOSIS — I209 Angina pectoris, unspecified: Secondary | ICD-10-CM

## 2014-11-22 DIAGNOSIS — R0602 Shortness of breath: Secondary | ICD-10-CM | POA: Diagnosis not present

## 2014-11-22 DIAGNOSIS — I10 Essential (primary) hypertension: Secondary | ICD-10-CM

## 2014-11-22 DIAGNOSIS — E785 Hyperlipidemia, unspecified: Secondary | ICD-10-CM

## 2014-11-22 NOTE — Addendum Note (Signed)
Addended by: Levonne Hubert on: 11/22/2014 12:22 PM   Modules accepted: Level of Service

## 2014-11-22 NOTE — Progress Notes (Signed)
Patient ID: Jorge Bishop, male   DOB: Dec 21, 1935, 79 y.o.   MRN: [id number]      SUBJECTIVE: The patient presents for routine follow up. He has a history of CABG in 1998 and subsequent percutaneous coronary intervention. He also has HTN, bilateral carotid artery stenosis, and hyperlipidemia.  Echocardiogram performed at an outside facility on 12/19/13 reportedly demonstrated normal left ventricular systolic function, EF 88%, with mild aortic regurgitation.  He has been doing very well and denies chest pain, palpitations, lightheadedness, dizziness, and leg swelling. However, he is noted progressive exertional dyspnea particularly when climbing stairs over the past 18 months. His most recent stress test was a stress echocardiogram on 04/30/11 which was positive for ischemia.   ECG performed in the office today demonstrates normal sinus rhythm with a right bundle branch block and old inferior infarct, heart rate 63 bpm.  He can walk 2-1/2 miles daily without experiencing any symptoms.   He volunteers in cardiac rehabilitation.   Review of Systems: As per "subjective", otherwise negative.  Allergies  Allergen Reactions  . Bactrim   . Clarithromycin     REACTION: UNKNOWN REACTION  . Codeine     REACTION: UNKNOWN REACTION  . Erythromycin   . Zithromax [Azithromycin]     Current Outpatient Prescriptions  Medication Sig Dispense Refill  . Ascorbic Acid (VITAMIN C) 1000 MG tablet Take 1,000 mg by mouth daily.    Marland Kitchen aspirin (ASPIRIN LOW DOSE) 81 MG tablet Take 81 mg by mouth daily.      Marland Kitchen atorvastatin (LIPITOR) 20 MG tablet Take 20 mg by mouth at bedtime.      Marland Kitchen ezetimibe (ZETIA) 10 MG tablet Take 10 mg by mouth daily.    . Fiber, Guar Gum, CHEW Chew 4 g by mouth daily.    Marland Kitchen labetalol (NORMODYNE) 200 MG tablet Take 200 mg by mouth 2 (two) times daily.      Marland Kitchen loperamide (IMODIUM A-D) 2 MG tablet Take 0.5 tablets (1 mg total) by mouth at bedtime. 1 tablet 0  . niacin (NIASPAN) 1000 MG CR  tablet Take 1,000 mg by mouth at bedtime.     . niacin (NIASPAN) 500 MG CR tablet Take 500 mg by mouth every morning.     Marland Kitchen omeprazole (PRILOSEC) 20 MG capsule Take 20 mg by mouth daily.     Marland Kitchen OVER THE COUNTER MEDICATION Place 1 drop into both eyes daily. Restore eye drops.    . polycarbophil (FIBERCON) 625 MG tablet Take by mouth daily. 4 gms daily    . Tamsulosin HCl (FLOMAX) 0.4 MG CAPS Take 0.4 mg by mouth daily.     No current facility-administered medications for this visit.    Past Medical History  Diagnosis Date  . Hyperlipidemia     Lipid profile in 12/2009:104, 91, 36, 50  . Cerebrovascular disease     60% bilateral internal carotid artery stenosis in 2012  . Thrombocytopenia     platelets of 104 in 03/2009  . Erectile dysfunction   . Migraine headache   . Benign prostatic hypertrophy   . Diverticulosis   . DJD (degenerative joint disease)   . Upper gastrointestinal hemorrhage 28/00     Helicobacter pylori positive; subsequent laparoscopic fundoplication-2009  . Arteriosclerotic cardiovascular disease (ASCVD)     Coronary artery bypass graft surgery in 12/98; DES to the RCA SVG in 07/2002; restenosis of the saphenous vein graft stent and renal intervention in 2006; 70% first diagonal present; calf in 11/2007-total obstruction  of the saphenous vein graft to the first marginal  . Right bundle branch block   . Shortness of breath   . GERD (gastroesophageal reflux disease)     Past Surgical History  Procedure Laterality Date  . Coronary artery bypass graft  1998   . Laparoscopic nissen fundoplication  9242, 6834     revision in 2009 along with repair of paraesophagel hernia   . Cardiac catheterization      4 cardiac stents  . Paraesophageal hernia repair    . Hernia repair      right inguinal  . Cataract extraction w/phaco Right 07/25/2012    Procedure: CATARACT EXTRACTION PHACO AND INTRAOCULAR LENS PLACEMENT (IOC);  Surgeon: Tonny Branch, MD;  Location: [facility];  Service:  Ophthalmology;  Laterality: Right;  CDE:23.31  . Cataract extraction w/phaco Left 08/18/2012    Procedure: CATARACT EXTRACTION PHACO AND INTRAOCULAR LENS PLACEMENT (IOC);  Surgeon: Tonny Branch, MD;  Location: [facility];  Service: Ophthalmology;  Laterality: Left;  CDE: 16.07  . Colonoscopy N/A 08/01/2014    Procedure: COLONOSCOPY;  Surgeon: Rogene Houston, MD;  Location: [facility];  Service: Endoscopy;  Laterality: N/A;  125 - moved to 10:30 - Ann to notify pt    History   Social History  . Marital Status: Married    Spouse Name: N/A  . Number of Children: N/A  . Years of Education: N/A   Occupational History  . Full time    Social History Main Topics  . Smoking status: Never Smoker   . Smokeless tobacco: Not on file  . Alcohol Use: No  . Drug Use: No  . Sexual Activity: Yes    Birth Control/ Protection: None   Other Topics Concern  . Not on file   Social History Narrative     Filed Vitals:   11/22/14 1127  BP: 126/64  Pulse: 73  Height: 6' (1.829 m)  Weight: 196 lb (88.905 kg)  SpO2: 99%    PHYSICAL EXAM General: NAD HEENT: Normal. Neck: No JVD, no thyromegaly. Lungs: Clear to auscultation bilaterally with normal respiratory effort. CV: Nondisplaced PMI.  Regular rate and rhythm, normal S1/S2, no S3/S4, no murmur. No pretibial or periankle edema.  No carotid bruit.  Normal pedal pulses.  Abdomen: Soft, nontender, no hepatosplenomegaly, no distention.  Neurologic: Alert and oriented x 3.  Psych: Normal affect. Skin: Normal. Musculoskeletal: Normal range of motion, no gross deformities. Extremities: No clubbing or cyanosis.   ECG: Most recent ECG reviewed.      ASSESSMENT AND PLAN: 1. CAD/CABG with subsequent PCI: Given progressive exertional dyspnea, will obtain exercise Cardiolite stress test. Continue aspirin, Lipitor, Zetia, and labetalol. Echo results from 12/2013 noted above.  2. Essential HTN: Well-controlled on current medical therapy which includes  labetalol. No changes.  3. Hyperlipidemia: Will obtain copy of lipids from PCP. Continue Lipitor and Zetia.  4. Bilateral carotid artery stenosis: Followed by PCP. Will obtain most recent carotid Doppler results.  Dispo: f/u 1 year.   Kate Sable, M.D., F.A.C.C.

## 2014-11-22 NOTE — Patient Instructions (Addendum)
Your physician wants you to follow-up in: 1 year with Dr. Dwana Curd.  You will receive a reminder letter in the mail two months in advance. If you don't receive a letter, please call our office to schedule the follow-up appointment.  Your physician recommends that you continue on your current medications as directed. Please refer to the Current Medication list given to you today.  Your physician has requested that you have en exercise stress myoview. For further information please visit [email or web address]. Please follow instruction sheet, as given. Please hold Labetalol the day of your Stress test   Thank you for choosing [facility]!

## 2014-12-05 ENCOUNTER — Encounter (HOSPITAL_COMMUNITY)
Admission: RE | Admit: 2014-12-05 | Discharge: 2014-12-05 | Disposition: A | Discharge status: Home / Self Care | Payer: Medicare Other | Source: Ambulatory Visit | Attending: Cardiovascular Disease | Admitting: Cardiovascular Disease

## 2014-12-05 ENCOUNTER — Encounter (HOSPITAL_COMMUNITY): Payer: Self-pay

## 2014-12-05 ENCOUNTER — Inpatient Hospital Stay (HOSPITAL_COMMUNITY): Admission: RE | Admit: 2014-12-05 | Payer: Medicare Other | Source: Ambulatory Visit

## 2014-12-05 DIAGNOSIS — I209 Angina pectoris, unspecified: Secondary | ICD-10-CM | POA: Diagnosis not present

## 2014-12-05 DIAGNOSIS — R0602 Shortness of breath: Secondary | ICD-10-CM

## 2014-12-05 LAB — NM MYOCAR MULTI W/SPECT W/WALL MOTION / EF
Estimated workload: 7.7 METS
Exercise duration (min): 6 min
Exercise duration (sec): 16 s
LV dias vol: 81 mL
LV sys vol: 30 mL
MPHR: 143 {beats}/min
Peak HR: 120 {beats}/min
Percent HR: 84 %
RATE: 0.37
RPE: 18
Rest HR: 57 {beats}/min
SDS: 0
SRS: 2
SSS: 2
TID: 1.04

## 2014-12-05 MED ORDER — TECHNETIUM TC 99M SESTAMIBI GENERIC - CARDIOLITE
10.0000 | Freq: Once | INTRAVENOUS | Status: AC | PRN
  Administered 2014-12-05: 10 via INTRAVENOUS

## 2014-12-05 MED ORDER — SODIUM CHLORIDE 0.9 % IJ SOLN
INTRAMUSCULAR | Status: AC
  Administered 2014-12-05: 10 mL via INTRAVENOUS
  Filled 2014-12-05: qty 3

## 2014-12-05 MED ORDER — REGADENOSON 0.4 MG/5ML IV SOLN
INTRAVENOUS | Status: AC
  Administered 2014-12-05: 0.4 mg via INTRAVENOUS
  Filled 2014-12-05: qty 5

## 2014-12-05 MED ORDER — TECHNETIUM TC 99M SESTAMIBI - CARDIOLITE
30.0000 | Freq: Once | INTRAVENOUS | Status: AC | PRN
  Administered 2014-12-05: 30 via INTRAVENOUS

## 2014-12-10 ENCOUNTER — Encounter (HOSPITAL_COMMUNITY): Payer: Medicare Other

## 2014-12-10 ENCOUNTER — Other Ambulatory Visit: Payer: Self-pay

## 2015-03-13 ENCOUNTER — Ambulatory Visit (HOSPITAL_COMMUNITY): Payer: Medicare Other | Attending: Neurosurgery | Admitting: Physical Therapy

## 2015-03-13 DIAGNOSIS — R29898 Other symptoms and signs involving the musculoskeletal system: Secondary | ICD-10-CM

## 2015-03-13 DIAGNOSIS — M2560 Stiffness of unspecified joint, not elsewhere classified: Secondary | ICD-10-CM | POA: Diagnosis present

## 2015-03-13 DIAGNOSIS — R262 Difficulty in walking, not elsewhere classified: Secondary | ICD-10-CM | POA: Diagnosis present

## 2015-03-13 DIAGNOSIS — R6889 Other general symptoms and signs: Secondary | ICD-10-CM | POA: Diagnosis present

## 2015-03-13 DIAGNOSIS — M545 Low back pain: Secondary | ICD-10-CM | POA: Insufficient documentation

## 2015-03-13 NOTE — Therapy (Signed)
[place], [place], [address] Phone: [phone]   Fax:  [phone]  Physical Therapy Evaluation  Patient Details  Name: Jorge Bishop MRN: [id number] Date of Birth: 1936/06/10 Referring Provider:  Consuella Lose, MD  Encounter Date: 03/13/2015      PT End of Session - 03/13/15 1621    Visit Number 1   Number of Visits 12   Date for PT Re-Evaluation 04/10/15   Authorization Type Medicare   Authorization Time Period 03/13/15-05/08/15   Authorization - Visit Number 1   Authorization - Number of Visits 10   PT Start Time 1430   PT Stop Time 1515   PT Time Calculation (min) 45 min   Activity Tolerance Patient tolerated treatment well   Behavior During Therapy [facility] for tasks assessed/performed      Past Medical History  Diagnosis Date  . Hyperlipidemia     Lipid profile in 12/2009:104, 91, 36, 50  . Cerebrovascular disease     60% bilateral internal carotid artery stenosis in 2012  . Thrombocytopenia     platelets of 104 in 03/2009  . Erectile dysfunction   . Migraine headache   . Benign prostatic hypertrophy   . Diverticulosis   . DJD (degenerative joint disease)   . Upper gastrointestinal hemorrhage 48/88     Helicobacter pylori positive; subsequent laparoscopic fundoplication-2009  . Arteriosclerotic cardiovascular disease (ASCVD)     Coronary artery bypass graft surgery in 12/98; DES to the RCA SVG in 07/2002; restenosis of the saphenous vein graft stent and renal intervention in 2006; 70% first diagonal present; calf in 11/2007-total obstruction of the saphenous vein graft to the first marginal  . Right bundle branch block   . Shortness of breath   . GERD (gastroesophageal reflux disease)     Past Surgical History  Procedure Laterality Date  . Coronary artery bypass graft  1998   . Laparoscopic nissen fundoplication  9169, 4503     revision in 2009 along with repair of paraesophagel hernia   .  Cardiac catheterization      4 cardiac stents  . Paraesophageal hernia repair    . Hernia repair      right inguinal  . Cataract extraction w/phaco Right 07/25/2012    Procedure: CATARACT EXTRACTION PHACO AND INTRAOCULAR LENS PLACEMENT (IOC);  Surgeon: Tonny Branch, MD;  Location: [facility];  Service: Ophthalmology;  Laterality: Right;  CDE:23.31  . Cataract extraction w/phaco Left 08/18/2012    Procedure: CATARACT EXTRACTION PHACO AND INTRAOCULAR LENS PLACEMENT (IOC);  Surgeon: Tonny Branch, MD;  Location: [facility];  Service: Ophthalmology;  Laterality: Left;  CDE: 16.07  . Colonoscopy N/A 08/01/2014    Procedure: COLONOSCOPY;  Surgeon: Rogene Houston, MD;  Location: [facility];  Service: Endoscopy;  Laterality: N/A;  125 - moved to 10:30 - Ann to notify pt    There were no vitals filed for this visit.  Visit Diagnosis:  Right low back pain, with sciatica presence unspecified  Weakness of right leg  Joint stiffness of spine  Decreased functional activity tolerance  Difficulty walking      Subjective Assessment - 03/13/15 1436    Subjective Pt reports that he has had long standing back pain for several years. He reports that he walks 3x per week with his wife, and he has LBP during walking but he is able to push through the pain. Pt reports that he feels that he is most limited with  bending over and with getting up and down from the floor. Pt reports that he has the most pain with sitting, he is able to relieve some pain with laying on his L side and straightening his leg. Pt states that he has also been having pain into his RLE, occasionally in his buttocks, and at other times it goes down into his toes. Pt reports that he is getting injections in his lumbar spine on 10/6.   How long can you sit comfortably? no limitations   How long can you stand comfortably? 4 hours- one shift at work   How long can you walk comfortably? pt has pain, but he walks anyway   Patient Stated Goals Decrease pain    Currently in Pain? Yes   Pain Score 5    Pain Location Back   Pain Orientation Right   Pain Descriptors / Indicators Aching;Constant   Pain Type Chronic pain            [facility] PT Assessment - 03/13/15 0001    Assessment   Medical Diagnosis LBP   Next MD Visit none scheduled for referring physician-getting injections 10/6   Prior Therapy no   Balance Screen   Has the patient fallen in the past 6 months No   Has the patient had a decrease in activity level because of a fear of falling?  No   Is the patient reluctant to leave their home because of a fear of falling?  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   Observation/Other Assessments   Focus on Therapeutic Outcomes (FOTO)  44% limited   ROM / Strength   AROM / PROM / Strength AROM;Strength   AROM   AROM Assessment Site Lumbar;Hip   Right/Left Hip Right;Left   Right Hip External Rotation  38   Right Hip Internal Rotation  17   Left Hip External Rotation  45   Left Hip Internal Rotation  25   Lumbar Flexion 63   Lumbar Extension 24   Lumbar - Right Side Bend 26   Lumbar - Left Side Bend 12   Strength   Strength Assessment Site Hip;Knee   Right/Left Hip Right;Left   Right Hip Flexion 3/5   Right Hip Extension 3-/5   Right Hip ABduction 4+/5   Left Hip Flexion 4/5   Left Hip Extension 3+/5   Left Hip ABduction 4+/5   Right/Left Knee Right;Left   Right Knee Flexion 4/5   Right Knee Extension 4/5   Left Knee Flexion 4+/5   Left Knee Extension 5/5   Transfers   Five time sit to stand comments  12.46"                            PT Education - 03/13/15 1621    Education provided Yes   Education Details HEP, prognosis   Person(s) Educated Patient   Methods Explanation;Handout   Comprehension Verbalized understanding;Returned demonstration          PT Short Term Goals - 03/13/15 1625  PT SHORT TERM GOAL #1   Title Pt will be independent with HEP.    Time 3   Period Weeks   Status New   PT SHORT TERM GOAL #2   Title Improve lumbar flexion by 10 degrees to demonstrate improved mobility of lumbar spine.    Time 3   Period Weeks   Status New   PT SHORT TERM GOAL #3   Title Increase right hip strength by 1/2 grade to improve functional mobility and gait mechanics.    Time 3   Period Weeks   Status New           PT Long Term Goals - Mar 26, 2015 1626    PT LONG TERM GOAL #1   Title Pt will be independent with advanced HEP for core stabilization.    Time 6   Period Weeks   Status New   PT LONG TERM GOAL #2   Title Improve RLE strength to 4+/5 or greater to improve ability to ambulate and climb stairs without pain or difficulty.    Time 6   Period Weeks   Status New   PT LONG TERM GOAL #3   Title Improve lumbar flexion ROM to 80 degrees or greater to demonstrate improved mobility of lumbar spine.    Time 6   Period Weeks   Status New   PT LONG TERM GOAL #4   Title Improve R hip IR and ER ROM by 7 degrees or greater to improve functional mobility.   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    Time 6   Period Weeks   Status New               Plan - 03/26/2015 1621    Clinical Impression Statement Pt presents to PT with c/o longstanding LBP that has been worsening over the past few months. He demonstrates decreased lumbar ROM, weakness of BLE R>L, decreased  R hip ROM, decreased core strength, and decreased functional activity tolerance. 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     Pt will benefit from skilled therapeutic intervention in order to improve on the following deficits Abnormal gait;Decreased activity tolerance;Decreased endurance;Decreased mobility;Decreased range of motion;Decreased strength;Difficulty walking;Postural dysfunction   Rehab Potential Good   PT Frequency 2x / week   PT Duration 6 weeks   PT Treatment/Interventions ADLs/Self Care Home Management;Moist Heat;Traction;Gait training;Stair training;Functional mobility training;Therapeutic activities;Therapeutic exercise;Balance training;Neuromuscular re-education;Patient/family education;Manual techniques   PT Next Visit Plan Review goals and HEP, continue with core strengthening          G-Codes - 2015/03/26 1631    Functional Assessment Tool Used FOTO   Functional Limitation Mobility: Walking and moving around   Mobility: Walking and Moving Around Current Status [phone]) At least 40 percent but less than 60 percent impaired, limited or restricted   Mobility: Walking and Moving Around Goal Status [phone]) At least 20 percent but less than 40 percent impaired, limited or restricted       Problem List Patient Active Problem List   Diagnosis Date Noted  . Hyperlipidemia   . Cerebrovascular  disease   . Thrombocytopenia   . Erectile dysfunction   . Migraine headache   . Upper gastrointestinal hemorrhage   . Arteriosclerotic cardiovascular disease (ASCVD)   . Hypertension 01/16/2009    Hilma Favors, PT, DPT [phone] 03/13/2015, 4:32 PM  [facility] [address] [place], [place], [address] Phone: [phone]   Fax:  [phone]

## 2015-03-13 NOTE — Patient Instructions (Signed)
Knee-to-Chest Stretch: Unilateral   With hand behind right knee, pull knee in to chest until a comfortable stretch is felt in lower back and buttocks. Keep back relaxed. Hold __10__ seconds. Repeat __10__ times per set. Do __1__ sets per session. Do _2___ sessions per day.  http://orth.exer.us/126   Copyright  VHI. All rights reserved.  Isometric Hold With Pelvic Floor (Hook-Lying)   Lie with hips and knees bent. Slowly inhale, and then exhale. Pull navel toward spine and tighten pelvic floor. Hold for __3_ seconds. Continue to breathe in and out during hold.   Repeat __15_ times. Do _2__ times a day.   Copyright  VHI. All rights reserved.  Bridging   Slowly raise buttocks from floor, keeping stomach tight. Repeat __10__ times per set. Do __2__ sets per session. Do __1__ sessions per day.  http://orth.exer.us/1096   Copyright  VHI. All rights reserved.  Straight Leg Raise   Tighten stomach and slowly raise locked right leg _15-18___ inches from floor. Repeat __10__ times per set. Do __2__ sets per session. Do __1__ sessions per day.  http://orth.exer.us/1102   Copyright  VHI. All rights reserved.

## 2015-03-15 ENCOUNTER — Telehealth: Payer: Self-pay | Admitting: Cardiovascular Disease

## 2015-03-15 NOTE — Telephone Encounter (Signed)
Have not received fax request from [facility].I spoke with Derl Barrow she has not gotten anything from Dr Alvan Dame YET.She needs order from Dr Alvan Dame.She asked me to inform patient it would be next week before she calls them. I informed wife

## 2015-03-15 NOTE — Telephone Encounter (Signed)
Pt needs sugrical clearance sent to [facility]: Orson Slick -surgical coordinator fax# [phone] tele [phone]  Pt is having lt knee surgery

## 2015-03-18 ENCOUNTER — Ambulatory Visit (HOSPITAL_COMMUNITY): Payer: Medicare Other | Attending: Neurosurgery | Admitting: Physical Therapy

## 2015-03-18 ENCOUNTER — Telehealth: Payer: Self-pay | Admitting: *Deleted

## 2015-03-18 DIAGNOSIS — R29898 Other symptoms and signs involving the musculoskeletal system: Secondary | ICD-10-CM | POA: Diagnosis present

## 2015-03-18 DIAGNOSIS — R262 Difficulty in walking, not elsewhere classified: Secondary | ICD-10-CM | POA: Diagnosis present

## 2015-03-18 DIAGNOSIS — M545 Low back pain: Secondary | ICD-10-CM | POA: Insufficient documentation

## 2015-03-18 DIAGNOSIS — M2560 Stiffness of unspecified joint, not elsewhere classified: Secondary | ICD-10-CM | POA: Diagnosis present

## 2015-03-18 DIAGNOSIS — R6889 Other general symptoms and signs: Secondary | ICD-10-CM | POA: Insufficient documentation

## 2015-03-18 NOTE — Patient Instructions (Signed)
PIRIFORMIS AND HIP STRETCH - SEATED  While sitting in a chair, cross your affected leg on top of the other as shown.   Next, gently lean forward until a stretch is felt along the crossed leg.  BRACE HEEL SLIDES  While lying on your back with your knees bent,  slowly slide your heel foward on the floor/bed and then slide it back. Use your stomach muscles to keep your spine from moving.   HIP ABDUCTION - SIDELYING  While lying on your side, slowly raise up your top leg to the side. Keep your knee straight and maintain your toes pointed forward the entire time.   The bottom leg can be bent to stabilize your body.

## 2015-03-18 NOTE — Telephone Encounter (Signed)
Medical clearance placed on Dr. Court Joy desk.

## 2015-03-18 NOTE — Therapy (Signed)
[facility] [address] [place], [place], [address] Phone: [phone]   Fax:  [phone]  Physical Therapy Treatment  Patient Details  Name: Jorge Bishop MRN: [id number] Date of Birth: December 03, 1935 Referring Provider:  [name], MD  Encounter Date: 03/18/2015      PT End of Session - 03/18/15 1601    Visit Number 2   Number of Visits 12   Date for PT Re-Evaluation 04/10/15   Authorization Type Medicare   Authorization Time Period 03/13/15-05/08/15   Authorization - Visit Number 2   Authorization - Number of Visits 10   PT Start Time 1522   PT Stop Time 1600   PT Time Calculation (min) 38 min   Activity Tolerance Patient tolerated treatment well   Behavior During Therapy [facility] for tasks assessed/performed      Past Medical History  Diagnosis Date  . Hyperlipidemia     Lipid profile in 12/2009:104, 91, 36, 50  . Cerebrovascular disease     60% bilateral internal carotid artery stenosis in 2012  . Thrombocytopenia     platelets of 104 in 03/2009  . Erectile dysfunction   . Migraine headache   . Benign prostatic hypertrophy   . Diverticulosis   . DJD (degenerative joint disease)   . Upper gastrointestinal hemorrhage 76/22     Helicobacter pylori positive; subsequent laparoscopic fundoplication-2009  . Arteriosclerotic cardiovascular disease (ASCVD)     Coronary artery bypass graft surgery in 12/98; DES to the RCA SVG in 07/2002; restenosis of the saphenous vein graft stent and renal intervention in 2006; 70% first diagonal present; calf in 11/2007-total obstruction of the saphenous vein graft to the first marginal  . Right bundle branch block   . Shortness of breath   . GERD (gastroesophageal reflux disease)     Past Surgical History  Procedure Laterality Date  . Coronary artery bypass graft  1998   . Laparoscopic nissen fundoplication  6333, 5456     revision in 2009 along with repair of paraesophagel hernia   .  Cardiac catheterization      4 cardiac stents  . Paraesophageal hernia repair    . Hernia repair      right inguinal  . Cataract extraction w/phaco Right 07/25/2012    Procedure: CATARACT EXTRACTION PHACO AND INTRAOCULAR LENS PLACEMENT (IOC);  Surgeon: Tonny Branch, MD;  Location: [facility];  Service: Ophthalmology;  Laterality: Right;  CDE:23.31  . Cataract extraction w/phaco Left 08/18/2012    Procedure: CATARACT EXTRACTION PHACO AND INTRAOCULAR LENS PLACEMENT (IOC);  Surgeon: Tonny Branch, MD;  Location: [facility];  Service: Ophthalmology;  Laterality: Left;  CDE: 16.07  . Colonoscopy N/A 08/01/2014    Procedure: COLONOSCOPY;  Surgeon: Rogene Houston, MD;  Location: [facility];  Service: Endoscopy;  Laterality: N/A;  125 - moved to 10:30 - Ann to notify pt    There were no vitals filed for this visit.  Visit Diagnosis:  Right low back pain, with sciatica presence unspecified  Weakness of right leg  Joint stiffness of spine  Decreased functional activity tolerance      Subjective Assessment - 03/18/15 1524    Subjective Pt reports that he walked 3 miles yesterday, and he was not as sore as he has been. He reports that he has been compliant with his HEP daily.    Currently in Pain? Yes   Pain Score 5    Pain Location Back   Pain Orientation Right  [place] Adult PT Treatment/Exercise - 03/18/15 0001    Exercises   Exercises Lumbar;Knee/Hip   Lumbar Exercises: Stretches   Active Hamstring Stretch 3 reps;30 seconds   Active Hamstring Stretch Limitations 14" step   Piriformis Stretch 3 reps;30 seconds   Piriformis Stretch Limitations seated   Lumbar Exercises: Standing   Functional Squats 10 reps   Functional Squats Limitations at mat table   Lumbar Exercises: Seated   Other Seated Lumbar Exercises seated march at mat table x 10 bilat   Lumbar Exercises: Supine   Ab Set 10 reps;3 seconds   Heel Slides 10 reps   Heel Slides Limitations with ab  set   Bridge 10 reps   Bridge Limitations LLE only   Straight Leg Raise 15 reps   Straight Leg Raises Limitations RLE   Lumbar Exercises: Sidelying   Hip Abduction 15 reps   Lumbar Exercises: Prone   Straight Leg Raise 10 reps   Manual Therapy   Manual Therapy Muscle Energy Technique   Muscle Energy Technique To correct posterior rotation on R- pt in prone, isometric hip flexion into table                PT Education - 03/18/15 1601    Education provided Yes   Education Details HEP updated   Person(s) Educated Patient   Methods Explanation;Handout   Comprehension Verbalized understanding;Returned demonstration          PT Short Term Goals - 03/13/15 1625    PT SHORT TERM GOAL #1   Title Pt will be independent with HEP.    Time 3   Period Weeks   Status New   PT SHORT TERM GOAL #2   Title Improve lumbar flexion by 10 degrees to demonstrate improved mobility of lumbar spine.    Time 3   Period Weeks   Status New   PT SHORT TERM GOAL #3   Title Increase right hip strength by 1/2 grade to improve functional mobility and gait mechanics.    Time 3   Period Weeks   Status New           PT Long Term Goals - 03/13/15 1626    PT LONG TERM GOAL #1   Title Pt will be independent with advanced HEP for core stabilization.    Time 6   Period Weeks   Status New   PT LONG TERM GOAL #2   Title Improve RLE strength to 4+/5 or greater to improve ability to ambulate and climb stairs without pain or difficulty.    Time 6   Period Weeks   Status New   PT LONG TERM GOAL #3   Title Improve lumbar flexion ROM to 80 degrees or greater to demonstrate improved mobility of lumbar spine.    Time 6   Period Weeks   Status New   PT LONG TERM GOAL #4   Title Improve R hip IR and ER ROM by 7 degrees or greater to improve functional mobility.   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    Time 6   Period Weeks   Status New               Plan - 03/18/15 1601  Clinical Impression Statement Pt's goals, eval, and HEP were reviewed in today's treatment. Pt presented with slight posterior rotation of SI on R side, corrected with MET in prone with pt performing isometric hip flexion. Core strengthening was progressed today with addition of ab brace with heel slide, functional squats, and sidelying hip abduction. Pt was able to complete all therex without c/o increased pain, and was given updated HEP with piriformis stretch, heel slides, and sidelying hip abduction.    PT Next Visit Plan Continue to check SI, progress core strengthening        Problem List Patient Active Problem List   Diagnosis Date Noted  . Hyperlipidemia   . Cerebrovascular disease   . Thrombocytopenia ([place])   . Erectile dysfunction   . Migraine headache   . Upper gastrointestinal hemorrhage   . Arteriosclerotic cardiovascular disease (ASCVD)   . Hypertension 01/16/2009    Hilma Favors, PT, DPT [phone] 03/18/2015, 4:05 PM  [facility] [place], [place], [address] Phone: [phone]   Fax:  [phone]

## 2015-03-20 ENCOUNTER — Ambulatory Visit (HOSPITAL_COMMUNITY): Payer: Medicare Other | Admitting: Physical Therapy

## 2015-03-20 DIAGNOSIS — R6889 Other general symptoms and signs: Secondary | ICD-10-CM

## 2015-03-20 DIAGNOSIS — R29898 Other symptoms and signs involving the musculoskeletal system: Secondary | ICD-10-CM

## 2015-03-20 DIAGNOSIS — M545 Low back pain: Secondary | ICD-10-CM | POA: Diagnosis not present

## 2015-03-20 DIAGNOSIS — M2560 Stiffness of unspecified joint, not elsewhere classified: Secondary | ICD-10-CM

## 2015-03-20 NOTE — Therapy (Signed)
[facility] [place], [place], [address] Phone: [phone]   Fax:  [phone]  Physical Therapy Treatment  Patient Details  Name: Jorge Bishop MRN: [id number] Date of Birth: 1935/10/15 Referring Provider:  [name], MD  Encounter Date: 03/20/2015      PT End of Session - 03/20/15 1451    Visit Number 3   Number of Visits 12   Date for PT Re-Evaluation 04/10/15   Authorization Type Medicare   Authorization Time Period 03/13/15-05/08/15   Authorization - Visit Number 3   Authorization - Number of Visits 10   PT Start Time 1696   PT Stop Time 1425   PT Time Calculation (min) 40 min   Activity Tolerance Patient tolerated treatment well   Behavior During Therapy [facility] for tasks assessed/performed      Past Medical History  Diagnosis Date  . Hyperlipidemia     Lipid profile in 12/2009:104, 91, 36, 50  . Cerebrovascular disease     60% bilateral internal carotid artery stenosis in 2012  . Thrombocytopenia     platelets of 104 in 03/2009  . Erectile dysfunction   . Migraine headache   . Benign prostatic hypertrophy   . Diverticulosis   . DJD (degenerative joint disease)   . Upper gastrointestinal hemorrhage 78/93     Helicobacter pylori positive; subsequent laparoscopic fundoplication-2009  . Arteriosclerotic cardiovascular disease (ASCVD)     Coronary artery bypass graft surgery in 12/98; DES to the RCA SVG in 07/2002; restenosis of the saphenous vein graft stent and renal intervention in 2006; 70% first diagonal present; calf in 11/2007-total obstruction of the saphenous vein graft to the first marginal  . Right bundle branch block   . Shortness of breath   . GERD (gastroesophageal reflux disease)     Past Surgical History  Procedure Laterality Date  . Coronary artery bypass graft  1998   . Laparoscopic nissen fundoplication  8101, 7510     revision in 2009 along with repair of paraesophagel hernia   .  Cardiac catheterization      4 cardiac stents  . Paraesophageal hernia repair    . Hernia repair      right inguinal  . Cataract extraction w/phaco Right 07/25/2012    Procedure: CATARACT EXTRACTION PHACO AND INTRAOCULAR LENS PLACEMENT (IOC);  Surgeon: Tonny Branch, MD;  Location: [facility];  Service: Ophthalmology;  Laterality: Right;  CDE:23.31  . Cataract extraction w/phaco Left 08/18/2012    Procedure: CATARACT EXTRACTION PHACO AND INTRAOCULAR LENS PLACEMENT (IOC);  Surgeon: Tonny Branch, MD;  Location: [facility];  Service: Ophthalmology;  Laterality: Left;  CDE: 16.07  . Colonoscopy N/A 08/01/2014    Procedure: COLONOSCOPY;  Surgeon: Rogene Houston, MD;  [facility];  Service: Endoscopy;  Laterality: N/A;  125 - moved to 10:30 - Ann to notify pt    There were no vitals filed for this visit.  Visit Diagnosis:  Right low back pain, with sciatica presence unspecified  Weakness of right leg  Joint stiffness of spine  Decreased functional activity tolerance      Subjective Assessment - 03/20/15 1346    Subjective Pt reports that he was pretty sore after last treatment, he feels it a lot in the inside of his legs.    Currently in Pain? Yes   Pain Score 5    Pain Location Back  [place] Adult PT Treatment/Exercise - 03/20/15 0001    Lumbar Exercises: Stretches   Active Hamstring Stretch 3 reps;30 seconds   Active Hamstring Stretch Limitations 14" step   Single Knee to Chest Stretch 10 seconds;5 reps   Piriformis Stretch 3 reps;30 seconds   Piriformis Stretch Limitations seated   Lumbar Exercises: Standing   Functional Squats 15 reps   Functional Squats Limitations at mat table   Forward Lunge 10 reps   Forward Lunge Limitations 4in step   Shoulder Extension 15 reps   Theraband Level (Shoulder Extension) Level 3 (Green)   Other Standing Lumbar Exercises sidestepping x2 with RTB, x1 with GTB   Lumbar Exercises: Supine   Ab Set 10 reps;3  seconds   Bent Knee Raise 10 reps   Bent Knee Raise Limitations with ab set   Bridge 15 reps   Straight Leg Raise 10 reps   Straight Leg Raises Limitations with ab set   Lumbar Exercises: Sidelying   Hip Abduction 15 reps   Lumbar Exercises: Prone   Straight Leg Raise 10 reps   Lumbar Exercises: Quadruped   Straight Leg Raise 10 reps   Manual Therapy   Manual Therapy Muscle Energy Technique   Muscle Energy Technique To correct posterior rotation on R- pt in prone, isometric hip flexion into table                  PT Short Term Goals - 03/13/15 1625    PT SHORT TERM GOAL #1   Title Pt will be independent with HEP.    Time 3   Period Weeks   Status New   PT SHORT TERM GOAL #2   Title Improve lumbar flexion by 10 degrees to demonstrate improved mobility of lumbar spine.    Time 3   Period Weeks   Status New   PT SHORT TERM GOAL #3   Title Increase right hip strength by 1/2 grade to improve functional mobility and gait mechanics.    Time 3   Period Weeks   Status New           PT Long Term Goals - 03/13/15 1626    PT LONG TERM GOAL #1   Title Pt will be independent with advanced HEP for core stabilization.    Time 6   Period Weeks   Status New   PT LONG TERM GOAL #2   Title Improve RLE strength to 4+/5 or greater to improve ability to ambulate and climb stairs without pain or difficulty.    Time 6   Period Weeks   Status New   PT LONG TERM GOAL #3   Title Improve lumbar flexion ROM to 80 degrees or greater to demonstrate improved mobility of lumbar spine.    Time 6   Period Weeks   Status New   PT LONG TERM GOAL #4   Title Improve R hip IR and ER ROM by 7 degrees or greater to improve functional mobility.   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    Time 6    Period Weeks   Status New               Plan - 03/20/15 1452    Clinical  Impression Statement Continued with core strengthening and functional stretching today. Pt was able to complete quadruped leg raise with tactile cueing for form. Pt was also able to progress to supine march with transverse abdominus contraction with good form and minimal cueing.    PT Next Visit Plan Continue to check SI, progress to quadruped arm/leg raise and dead bug        Problem List Patient Active Problem List   Diagnosis Date Noted  . Hyperlipidemia   . Cerebrovascular disease   . Thrombocytopenia ([place])   . Erectile dysfunction   . Migraine headache   . Upper gastrointestinal hemorrhage   . Arteriosclerotic cardiovascular disease (ASCVD)   . Hypertension 01/16/2009    Hilma Favors, PT, DPT [phone] 03/20/2015, 2:57 PM  [facility] [address] [place], [place], [address] Phone: [phone]   Fax:  [phone]

## 2015-03-25 ENCOUNTER — Ambulatory Visit (HOSPITAL_COMMUNITY): Payer: Medicare Other

## 2015-03-25 DIAGNOSIS — R262 Difficulty in walking, not elsewhere classified: Secondary | ICD-10-CM

## 2015-03-25 DIAGNOSIS — M2560 Stiffness of unspecified joint, not elsewhere classified: Secondary | ICD-10-CM

## 2015-03-25 DIAGNOSIS — M545 Low back pain: Secondary | ICD-10-CM

## 2015-03-25 DIAGNOSIS — R6889 Other general symptoms and signs: Secondary | ICD-10-CM

## 2015-03-25 DIAGNOSIS — R29898 Other symptoms and signs involving the musculoskeletal system: Secondary | ICD-10-CM

## 2015-03-25 NOTE — Therapy (Signed)
[facility] [address] [place], [place], [address] Phone: [phone]   Fax:  [phone]  Physical Therapy Treatment  Patient Details  Name: Jorge Bishop MRN: [id number] Date of Birth: 1936-03-27 Referring Provider:  Consuella Lose, MD  Encounter Date: 03/25/2015      PT End of Session - 03/25/15 1409    Visit Number 4   Number of Visits 12   Date for PT Re-Evaluation 04/10/15   Authorization Type Medicare   Authorization Time Period 03/13/15-05/08/15   Authorization - Visit Number 4   Authorization - Number of Visits 10   PT Start Time 5400   PT Stop Time 1355   PT Time Calculation (min) 52 min   Activity Tolerance Patient tolerated treatment well   Behavior During Therapy [facility] for tasks assessed/performed      Past Medical History  Diagnosis Date  . Hyperlipidemia     Lipid profile in 12/2009:104, 91, 36, 50  . Cerebrovascular disease     60% bilateral internal carotid artery stenosis in 2012  . Thrombocytopenia     platelets of 104 in 03/2009  . Erectile dysfunction   . Migraine headache   . Benign prostatic hypertrophy   . Diverticulosis   . DJD (degenerative joint disease)   . Upper gastrointestinal hemorrhage 86/76     Helicobacter pylori positive; subsequent laparoscopic fundoplication-2009  . Arteriosclerotic cardiovascular disease (ASCVD)     Coronary artery bypass graft surgery in 12/98; DES to the RCA SVG in 07/2002; restenosis of the saphenous vein graft stent and renal intervention in 2006; 70% first diagonal present; calf in 11/2007-total obstruction of the saphenous vein graft to the first marginal  . Right bundle branch block   . Shortness of breath   . GERD (gastroesophageal reflux disease)     Past Surgical History  Procedure Laterality Date  . Coronary artery bypass graft  1998   . Laparoscopic nissen fundoplication  1950, 9326     revision in 2009 along with repair of paraesophagel hernia   .  Cardiac catheterization      4 cardiac stents  . Paraesophageal hernia repair    . Hernia repair      right inguinal  . Cataract extraction w/phaco Right 07/25/2012    Procedure: CATARACT EXTRACTION PHACO AND INTRAOCULAR LENS PLACEMENT (IOC);  Surgeon: Tonny Branch, MD;  Location: [facility];  Service: Ophthalmology;  Laterality: Right;  CDE:23.31  . Cataract extraction w/phaco Left 08/18/2012    Procedure: CATARACT EXTRACTION PHACO AND INTRAOCULAR LENS PLACEMENT (IOC);  Surgeon: Tonny Branch, MD;  Location: [facility];  Service: Ophthalmology;  Laterality: Left;  CDE: 16.07  . Colonoscopy N/A 08/01/2014    Procedure: COLONOSCOPY;  Surgeon: Rogene Houston, MD;  [facility]: [facility];  Service: Endoscopy;  Laterality: N/A;  125 - moved to 10:30 - Ann to notify pt    There were no vitals filed for this visit.  Visit Diagnosis:  Right low back pain, with sciatica presence unspecified  Weakness of right leg  Joint stiffness of spine  Decreased functional activity tolerance  Difficulty walking      Subjective Assessment - 03/25/15 1305    Subjective Pt reports he is doing well. He reports he had an injection L3/4 and that the pain there has subsided to about a 3, but the back muscles are getting better.    Patient Stated Goals Decrease pain   Currently in Pain? Yes   Pain Score 5  Pain Location Back   Pain Orientation Right   Pain Descriptors / Indicators --  Pt reports that the spine pain on R has mproved to a 3/10 with injection, but muscle spasm pain has remained 5/10, painful from stretches.                          [facility] Adult PT Treatment/Exercise - 03/25/15 0001    Lumbar Exercises: Stretches   Passive Hamstring Stretch 3 reps;30 seconds  Supine hip at 90*   Single Knee to Chest Stretch 3 reps;30 seconds  bilat, reports R LBP c L STKC (DC p 1x)   Lower Trunk Rotation Other (comment)  hooklying  10x bilat   Lumbar Exercises: Standing   Other Standing Lumbar  Exercises Repeated extension, hands on hips  20x   Lumbar Exercises: Seated   Other Seated Lumbar Exercises seated march at mat table x10 bilat  tactile cues for lumbar stabilization throughout.    Lumbar Exercises: Supine   Ab Set 10 reps  Cued with postural breathing hooklying x10   Bridge 15 reps;1 second  paired with TrA contraction   Lumbar Exercises: Quadruped   Single Arm Raise 10 reps;Left;Right  alteranting, tactile cues for static trunk.    Straight Leg Raise Other (comment)  2x8 bilat; Fatigues quickly, and uses Lumbar F instead of ER   Manual Therapy   Manual Therapy Myofascial release   Myofascial Release RT lumbar paraspinals/ QL  8 Minutes; significant decrease in pain, improved mobility.                 PT Education - 03/25/15 1409    Education provided Yes   Education Details Explained diagnosis utility of injection, and purpose of lumbar stabilization in improving pain.    Person(s) Educated Patient   Methods Explanation   Comprehension Verbalized understanding          PT Short Term Goals - 03/13/15 1625    PT SHORT TERM GOAL #1   Title Pt will be independent with HEP.    Time 3   Period Weeks   Status New   PT SHORT TERM GOAL #2   Title Improve lumbar flexion by 10 degrees to demonstrate improved mobility of lumbar spine.    Time 3   Period Weeks   Status New   PT SHORT TERM GOAL #3   Title Increase right hip strength by 1/2 grade to improve functional mobility and gait mechanics.    Time 3   Period Weeks   Status New           PT Long Term Goals - 03/13/15 1626    PT LONG TERM GOAL #1   Title Pt will be independent with advanced HEP for core stabilization.    Time 6   Period Weeks   Status New   PT LONG TERM GOAL #2   Title Improve RLE strength to 4+/5 or greater to improve ability to ambulate and climb stairs without pain or difficulty.    Time 6   Period Weeks   Status New   PT LONG TERM GOAL #3   Title Improve lumbar  flexion ROM to 80 degrees or greater to demonstrate improved mobility of lumbar spine.    Time 6   Period Weeks   Status New   PT LONG TERM GOAL #4   Title Improve R hip IR and ER ROM by 7 degrees or greater to improve  functional mobility.   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    Time 6   Period Weeks   Status New               Plan - 03/25/15 1411    Clinical Impression Statement Given pt feedback that stretching continues to aggravate back pain, began session with myofascial release of R lumbar paraspinals and quadratus lumborum, after which patient tolerating stretches and AROM with much more ease. Pt reoprting improved pain tolerance after manual therapy.  Progressed dynamic core stability training to quadruped adtivities. Pt demonstrating ability to perform msot activities with good form after extensive tactile and verbal cuing, but has very limited endurance of postural stabilizers.    Pt will benefit from skilled therapeutic intervention in order to improve on the following deficits Abnormal gait;Decreased activity tolerance;Decreased endurance;Decreased mobility;Decreased range of motion;Decreased strength;Difficulty walking;Postural dysfunction   Rehab Potential Good   PT Frequency 2x / week   PT Duration 6 weeks   PT Treatment/Interventions ADLs/Self Care Home Management;Moist Heat;Traction;Gait training;Stair training;Functional mobility training;Therapeutic activities;Therapeutic exercise;Balance training;Neuromuscular re-education;Patient/family education;Manual techniques   PT Next Visit Plan See how long MFR benefited patient after session. Consider continued avoidance of repetitive lumbar flexion activites as they appeared to aggravate contralateral LBP this session. Pt  responding well to stabilization and extension oriented activiities.    PT Home Exercise Plan Optional addition of seated marching.    Consulted and Agree with Plan of Care Patient        Problem List Patient Active Problem List   Diagnosis Date Noted  . Hyperlipidemia   . Cerebrovascular disease   . Thrombocytopenia ([place])   . Erectile dysfunction   . Migraine headache   . Upper gastrointestinal hemorrhage   . Arteriosclerotic cardiovascular disease (ASCVD)   . Hypertension 01/16/2009    Buccola,Allan C 03/25/2015, 2:15 PM  2:15 PM  Etta Grandchild, PT, DPT [place] License # [id number]       [facility] [address] [place], [place], [address] Phone: [phone]   Fax:  [phone]

## 2015-03-27 ENCOUNTER — Ambulatory Visit (HOSPITAL_COMMUNITY): Payer: Medicare Other

## 2015-03-27 DIAGNOSIS — R6889 Other general symptoms and signs: Secondary | ICD-10-CM

## 2015-03-27 DIAGNOSIS — M545 Low back pain: Secondary | ICD-10-CM

## 2015-03-27 DIAGNOSIS — R262 Difficulty in walking, not elsewhere classified: Secondary | ICD-10-CM

## 2015-03-27 DIAGNOSIS — R29898 Other symptoms and signs involving the musculoskeletal system: Secondary | ICD-10-CM

## 2015-03-27 DIAGNOSIS — M2560 Stiffness of unspecified joint, not elsewhere classified: Secondary | ICD-10-CM

## 2015-03-27 NOTE — Therapy (Signed)
[facility] [address] [place], [place], [address] Phone: [phone]   Fax:  [phone]  Physical Therapy Treatment  Patient Details  Name: Jorge Bishop MRN: [id number] Date of Birth: 21-Dec-1935 Referring Provider:  Consuella Lose, MD  Encounter Date: 03/27/2015      PT End of Session - 03/27/15 1352    Visit Number 5   Number of Visits 12   Date for PT Re-Evaluation 04/10/15   Authorization Type Medicare   Authorization Time Period 03/13/15-05/08/15   Authorization - Visit Number 5   Authorization - Number of Visits 10   PT Start Time 5176   PT Stop Time 1428   PT Time Calculation (min) 39 min   Activity Tolerance Patient tolerated treatment well   Behavior During Therapy [facility] for tasks assessed/performed      Past Medical History  Diagnosis Date  . Hyperlipidemia     Lipid profile in 12/2009:104, 91, 36, 50  . Cerebrovascular disease     60% bilateral internal carotid artery stenosis in 2012  . Thrombocytopenia     platelets of 104 in 03/2009  . Erectile dysfunction   . Migraine headache   . Benign prostatic hypertrophy   . Diverticulosis   . DJD (degenerative joint disease)   . Upper gastrointestinal hemorrhage 16/07     Helicobacter pylori positive; subsequent laparoscopic fundoplication-2009  . Arteriosclerotic cardiovascular disease (ASCVD)     Coronary artery bypass graft surgery in 12/98; DES to the RCA SVG in 07/2002; restenosis of the saphenous vein graft stent and renal intervention in 2006; 70% first diagonal present; calf in 11/2007-total obstruction of the saphenous vein graft to the first marginal  . Right bundle branch block   . Shortness of breath   . GERD (gastroesophageal reflux disease)     Past Surgical History  Procedure Laterality Date  . Coronary artery bypass graft  1998   . Laparoscopic nissen fundoplication  3710, 6269     revision in 2009 along with repair of paraesophagel hernia   .  Cardiac catheterization      4 cardiac stents  . Paraesophageal hernia repair    . Hernia repair      right inguinal  . Cataract extraction w/phaco Right 07/25/2012    Procedure: CATARACT EXTRACTION PHACO AND INTRAOCULAR LENS PLACEMENT (IOC);  Surgeon: Tonny Branch, MD;  Location: [facility];  Service: Ophthalmology;  Laterality: Right;  CDE:23.31  . Cataract extraction w/phaco Left 08/18/2012    Procedure: CATARACT EXTRACTION PHACO AND INTRAOCULAR LENS PLACEMENT (IOC);  Surgeon: Tonny Branch, MD;  Location: [facility];  Service: Ophthalmology;  Laterality: Left;  CDE: 16.07  . Colonoscopy N/A 08/01/2014    Procedure: COLONOSCOPY;  Surgeon: Rogene Houston, MD;  Location[facility];  Service: Endoscopy;  Laterality: N/A;  125 - moved to 10:30 - Ann to notify pt    There were no vitals filed for this visit.  Visit Diagnosis:  Right low back pain, with sciatica presence unspecified  Weakness of right leg  Joint stiffness of spine  Decreased functional activity tolerance  Difficulty walking      Subjective Assessment - 03/27/15 1346    Subjective Pt reported he had relief following massage last session, reports compliance with HEP this morning and has been doing daily.  Current pain scale lower right side pain scale 4/10   Currently in Pain? Yes   Pain Score 4    Pain Location Back   Pain  Orientation Right   Pain Descriptors / Indicators Sore            [facility] Adult PT Treatment/Exercise - 03/27/15 0001    Lumbar Exercises: Stretches   Passive Hamstring Stretch 3 reps;30 seconds   Passive Hamstring Stretch Limitations supine to rope   Single Knee to Chest Stretch 3 reps;30 seconds   Lower Trunk Rotation 5 reps;10 seconds  hooklying   Prone on Elbows Stretch 2 reps;30 seconds   Lumbar Exercises: Standing   Functional Squats 15 reps   Functional Squats Limitations squat infront of mat then lumbar extension   Lumbar Exercises: Supine   Bridge 15 reps  with TrA activation   Lumbar  Exercises: Quadruped   Single Arm Raise 15 reps   Single Arm Raises Limitations cueing for stabiltiy   Straight Leg Raise 15 reps   Straight Leg Raises Limitations cueing for stabilty   Opposite Arm/Leg Raise Right arm/Left leg;Left arm/Right leg;5 reps;5 seconds   Manual Therapy   Manual Therapy Myofascial release   Myofascial Release RT lumbar paraspinals/ QL             PT Short Term Goals - 03/13/15 1625    PT SHORT TERM GOAL #1   Title Pt will be independent with HEP.    Time 3   Period Weeks   Status New   PT SHORT TERM GOAL #2   Title Improve lumbar flexion by 10 degrees to demonstrate improved mobility of lumbar spine.    Time 3   Period Weeks   Status New   PT SHORT TERM GOAL #3   Title Increase right hip strength by 1/2 grade to improve functional mobility and gait mechanics.    Time 3   Period Weeks   Status New           PT Long Term Goals - 03/13/15 1626    PT LONG TERM GOAL #1   Title Pt will be independent with advanced HEP for core stabilization.    Time 6   Period Weeks   Status New   PT LONG TERM GOAL #2   Title Improve RLE strength to 4+/5 or greater to improve ability to ambulate and climb stairs without pain or difficulty.    Time 6   Period Weeks   Status New   PT LONG TERM GOAL #3   Title Improve lumbar flexion ROM to 80 degrees or greater to demonstrate improved mobility of lumbar spine.    Time 6   Period Weeks   Status New   PT LONG TERM GOAL #4   Title Improve R hip IR and ER ROM by 7 degrees or greater to improve functional mobility.   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    Time 6   Period Weeks   Status New               Plan - 03/27/15 1352    Clinical Impression Statement Began session with MFR to Rt lumbar paraspinals  and quadratus lumborum with reports of significant pain reduction.  Progressed core stabilty with opposite arm and leg wtih excessive tacitle and verbal cueing for stabilty.  Majority of exercises today focus on lumbar extension with reports of  relief with extension.  No reports of pain through session, pt did report his lower back felt looser following manual.  Pt was limited by fatigue with activtiies.     PT Next Visit Plan Continue with manual to reduce tightness and pain.. Consider continued avoidance of repetitive lumbar flexion activites as they appeared to aggravate contralateral LBP this session. Pt responding well to stabilization and extension oriented activiities.         Problem List Patient Active Problem List   Diagnosis Date Noted  . Hyperlipidemia   . Cerebrovascular disease   . Thrombocytopenia ([place])   . Erectile dysfunction   . Migraine headache   . Upper gastrointestinal hemorrhage   . Arteriosclerotic cardiovascular disease (ASCVD)   . Hypertension 01/16/2009   Ihor Austin, [place]; [place]  Aldona Lento 03/27/2015, 2:33 PM  [facility] [address] [place], [place], [address] Phone: [phone]   Fax:  [phone]

## 2015-04-01 ENCOUNTER — Ambulatory Visit (HOSPITAL_COMMUNITY): Payer: Medicare Other | Admitting: Physical Therapy

## 2015-04-01 DIAGNOSIS — R262 Difficulty in walking, not elsewhere classified: Secondary | ICD-10-CM

## 2015-04-01 DIAGNOSIS — M545 Low back pain: Secondary | ICD-10-CM

## 2015-04-01 DIAGNOSIS — R6889 Other general symptoms and signs: Secondary | ICD-10-CM

## 2015-04-01 DIAGNOSIS — R29898 Other symptoms and signs involving the musculoskeletal system: Secondary | ICD-10-CM

## 2015-04-01 DIAGNOSIS — M2560 Stiffness of unspecified joint, not elsewhere classified: Secondary | ICD-10-CM

## 2015-04-01 NOTE — Therapy (Signed)
[facility] [address] [place], [place], [address] Phone: [phone]   Fax:  [phone]  Physical Therapy Treatment  Patient Details  Name: Jorge Bishop MRN: [id number] Date of Birth: 06-26-1935 Referring Provider: Consuella Lose   Encounter Date: 04/01/2015      PT End of Session - 04/01/15 1430    Visit Number 6   Number of Visits 12   Date for PT Re-Evaluation 04/10/15   Authorization Type Medicare   Authorization Time Period 03/13/15-05/08/15   Authorization - Visit Number 6   Authorization - Number of Visits 10   PT Start Time 7673   PT Stop Time 1428   PT Time Calculation (min) 41 min   Activity Tolerance Patient tolerated treatment well   Behavior During Therapy [facility] for tasks assessed/performed      Past Medical History  Diagnosis Date  . Hyperlipidemia     Lipid profile in 12/2009:104, 91, 36, 50  . Cerebrovascular disease     60% bilateral internal carotid artery stenosis in 2012  . Thrombocytopenia     platelets of 104 in 03/2009  . Erectile dysfunction   . Migraine headache   . Benign prostatic hypertrophy   . Diverticulosis   . DJD (degenerative joint disease)   . Upper gastrointestinal hemorrhage 41/93     Helicobacter pylori positive; subsequent laparoscopic fundoplication-2009  . Arteriosclerotic cardiovascular disease (ASCVD)     Coronary artery bypass graft surgery in 12/98; DES to the RCA SVG in 07/2002; restenosis of the saphenous vein graft stent and renal intervention in 2006; 70% first diagonal present; calf in 11/2007-total obstruction of the saphenous vein graft to the first marginal  . Right bundle branch block   . Shortness of breath   . GERD (gastroesophageal reflux disease)     Past Surgical History  Procedure Laterality Date  . Coronary artery bypass graft  1998   . Laparoscopic nissen fundoplication  7902, 4097     revision in 2009 along with repair of paraesophagel hernia   .  Cardiac catheterization      4 cardiac stents  . Paraesophageal hernia repair    . Hernia repair      right inguinal  . Cataract extraction w/phaco Right 07/25/2012    Procedure: CATARACT EXTRACTION PHACO AND INTRAOCULAR LENS PLACEMENT (IOC);  Surgeon: Tonny Branch, MD;  Location: [facility];  Service: Ophthalmology;  Laterality: Right;  CDE:23.31  . Cataract extraction w/phaco Left 08/18/2012    Procedure: CATARACT EXTRACTION PHACO AND INTRAOCULAR LENS PLACEMENT (IOC);  Surgeon: Tonny Branch, MD;  Location: [facility];  Service: Ophthalmology;  Laterality: Left;  CDE: 16.07  . Colonoscopy N/A 08/01/2014    Procedure: COLONOSCOPY;  Surgeon: Rogene Houston, MD;  [facility];  Service: Endoscopy;  Laterality: N/A;  125 - moved to 10:30 - Ann to notify pt    There were no vitals filed for this visit.  Visit Diagnosis:  Right low back pain, with sciatica presence unspecified  Weakness of right leg  Joint stiffness of spine  Decreased functional activity tolerance  Difficulty walking          [facility] PT Assessment - 04/01/15 0001    Assessment   Referring Provider Consuella Lose                      [facility] Adult PT Treatment/Exercise - 04/01/15 0001    Lumbar Exercises: Stretches   Active Hamstring Stretch 3  reps;30 seconds   Active Hamstring Stretch Limitations 12 inch step    Passive Hamstring Stretch 3 reps;30 seconds   Passive Hamstring Stretch Limitations gastroc on slantboard    Single Knee to Chest Stretch 5 reps;10 seconds   Lower Trunk Rotation 5 reps;10 seconds   Piriformis Stretch 3 reps;30 seconds   Piriformis Stretch Limitations seated    Lumbar Exercises: Supine   Ab Set 20 reps   AB Set Limitations 5 second holds    Bridge 20 reps   Straight Leg Raise 15 reps   Lumbar Exercises: Sidelying   Hip Abduction 15 reps   Lumbar Exercises: Prone   Straight Leg Raise 10 reps   Manual Therapy   Manual Therapy Myofascial release   Myofascial Release  lumbar paraspinals/QL                 PT Education - 04/01/15 1430    Education provided No          PT Short Term Goals - 03/13/15 1625    PT SHORT TERM GOAL #1   Title Pt will be independent with HEP.    Time 3   Period Weeks   Status New   PT SHORT TERM GOAL #2   Title Improve lumbar flexion by 10 degrees to demonstrate improved mobility of lumbar spine.    Time 3   Period Weeks   Status New   PT SHORT TERM GOAL #3   Title Increase right hip strength by 1/2 grade to improve functional mobility and gait mechanics.    Time 3   Period Weeks   Status New           PT Long Term Goals - 03/13/15 1626    PT LONG TERM GOAL #1   Title Pt will be independent with advanced HEP for core stabilization.    Time 6   Period Weeks   Status New   PT LONG TERM GOAL #2   Title Improve RLE strength to 4+/5 or greater to improve ability to ambulate and climb stairs without pain or difficulty.    Time 6   Period Weeks   Status New   PT LONG TERM GOAL #3   Title Improve lumbar flexion ROM to 80 degrees or greater to demonstrate improved mobility of lumbar spine.    Time 6   Period Weeks   Status New   PT LONG TERM GOAL #4   Title Improve R hip IR and ER ROM by 7 degrees or greater to improve functional mobility.   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    Time 6   Period Weeks   Status New               Plan - 04/01/15 1430    Clinical Impression Statement Continued with functional streches, soft tissue mobility activities, and stabilization exercises; finished session today with myofascial release to bilateral paraspinals and quadratus lumborum with good tolerance by patient, overall reduction in pain and improvement in overall mobility at end of session today.    Pt  will benefit from skilled therapeutic intervention in order to improve on the following deficits Abnormal gait;Decreased activity tolerance;Decreased endurance;Decreased mobility;Decreased range of motion;Decreased strength;Difficulty walking;Postural dysfunction  Rehab Potential Good   PT Frequency 2x / week   PT Duration 6 weeks   PT Treatment/Interventions ADLs/Self Care Home Management;Moist Heat;Traction;Gait training;Stair training;Functional mobility training;Therapeutic activities;Therapeutic exercise;Balance training;Neuromuscular re-education;Patient/family education;Manual techniques   PT Next Visit Plan  Continue with manual to reduce tightness and pain, trial myofascial stretch along with release. Continue stablization program.    PT Home Exercise Plan Optional addition of seated marching.    Consulted and Agree with Plan of Care Patient        Problem List Patient Active Problem List   Diagnosis Date Noted  . Hyperlipidemia   . Cerebrovascular disease   . Thrombocytopenia ([place])   . Erectile dysfunction   . Migraine headache   . Upper gastrointestinal hemorrhage   . Arteriosclerotic cardiovascular disease (ASCVD)   . Hypertension 01/16/2009    Deniece Ree PT, DPT [phone]  [facility] [address] [place], [place], [address] Phone: [phone]   Fax:  [phone]  Name: Jorge Bishop MRN: [id number] Date of Birth: September 18, 1935

## 2015-04-03 ENCOUNTER — Ambulatory Visit (HOSPITAL_COMMUNITY): Payer: Medicare Other | Admitting: Physical Therapy

## 2015-04-03 DIAGNOSIS — M545 Low back pain: Secondary | ICD-10-CM

## 2015-04-03 DIAGNOSIS — R29898 Other symptoms and signs involving the musculoskeletal system: Secondary | ICD-10-CM

## 2015-04-03 DIAGNOSIS — M2560 Stiffness of unspecified joint, not elsewhere classified: Secondary | ICD-10-CM

## 2015-04-03 DIAGNOSIS — R6889 Other general symptoms and signs: Secondary | ICD-10-CM

## 2015-04-03 NOTE — Therapy (Signed)
[facility] [address] [place], [place], [address] Phone: [phone]   Fax:  [phone]  Physical Therapy Treatment  Patient Details  Name: Jorge Bishop MRN: [id number] Date of Birth: 1935/08/26 Referring Provider: Consuella Lose   Encounter Date: 04/03/2015      PT End of Session - 04/03/15 1421    Visit Number 7   Number of Visits 12   Date for PT Re-Evaluation 04/10/15   Authorization Type Medicare   Authorization Time Period 03/13/15-05/08/15   Authorization - Visit Number 7   Authorization - Number of Visits 10   PT Start Time 3976   PT Stop Time 1418   PT Time Calculation (min) 33 min   Activity Tolerance Patient tolerated treatment well   Behavior During Therapy [facility] for tasks assessed/performed      Past Medical History  Diagnosis Date  . Hyperlipidemia     Lipid profile in 12/2009:104, 91, 36, 50  . Cerebrovascular disease     60% bilateral internal carotid artery stenosis in 2012  . Thrombocytopenia     platelets of 104 in 03/2009  . Erectile dysfunction   . Migraine headache   . Benign prostatic hypertrophy   . Diverticulosis   . DJD (degenerative joint disease)   . Upper gastrointestinal hemorrhage 73/41     Helicobacter pylori positive; subsequent laparoscopic fundoplication-2009  . Arteriosclerotic cardiovascular disease (ASCVD)     Coronary artery bypass graft surgery in 12/98; DES to the RCA SVG in 07/2002; restenosis of the saphenous vein graft stent and renal intervention in 2006; 70% first diagonal present; calf in 11/2007-total obstruction of the saphenous vein graft to the first marginal  . Right bundle branch block   . Shortness of breath   . GERD (gastroesophageal reflux disease)     Past Surgical History  Procedure Laterality Date  . Coronary artery bypass graft  1998   . Laparoscopic nissen fundoplication  9379, 0240     revision in 2009 along with repair of paraesophagel hernia   .  Cardiac catheterization      4 cardiac stents  . Paraesophageal hernia repair    . Hernia repair      right inguinal  . Cataract extraction w/phaco Right 07/25/2012    Procedure: CATARACT EXTRACTION PHACO AND INTRAOCULAR LENS PLACEMENT (IOC);  Surgeon: Tonny Branch, MD;  Location: [facility];  Service: Ophthalmology;  Laterality: Right;  CDE:23.31  . Cataract extraction w/phaco Left 08/18/2012    Procedure: CATARACT EXTRACTION PHACO AND INTRAOCULAR LENS PLACEMENT (IOC);  Surgeon: Tonny Branch, MD;  Location: [facility];  Service: Ophthalmology;  Laterality: Left;  CDE: 16.07  . Colonoscopy N/A 08/01/2014    Procedure: COLONOSCOPY;  Surgeon: Rogene Houston, MD;  Location: [facility];  Service: Endoscopy;  Laterality: N/A;  125 - moved to 10:30 - Ann to notify pt    There were no vitals filed for this visit.  Visit Diagnosis:  Right low back pain, with sciatica presence unspecified  Weakness of right leg  Joint stiffness of spine  Decreased functional activity tolerance      Subjective Assessment - 04/03/15 1347    Subjective Pt reports that he is lethargic today because he has had a chest cold for the past few days. He reports that his back has been feeling better.    Currently in Pain? Yes   Pain Score 3    Pain Location Back   Pain Orientation Right  [facility] Adult PT Treatment/Exercise - 04/03/15 0001    Lumbar Exercises: Stretches   Active Hamstring Stretch 3 reps;30 seconds   Active Hamstring Stretch Limitations 14" step   Piriformis Stretch 3 reps;30 seconds   Piriformis Stretch Limitations seated    Lumbar Exercises: Standing   Functional Squats 5 reps   Functional Squats Limitations attempted 15, unable to complete due to vertigo sx   Other Standing Lumbar Exercises sidestepping with BTB x 2 RT  unable to complete 3RT d/t vertigo sx   Lumbar Exercises: Supine   Ab Set 15 reps;5 seconds   Bent Knee Raise 10 reps   Bent Knee Raise  Limitations with ab set   Bridge 15 reps  with TrA activation   Other Supine Lumbar Exercises tap downs from tabletop x 10 bilat   Lumbar Exercises: Sidelying   Hip Abduction 15 reps   Hip Abduction Weights (lbs) 2   Lumbar Exercises: Prone   Straight Leg Raise --  attempted, unable to lay in prone                  PT Short Term Goals - 03/13/15 1625    PT SHORT TERM GOAL #1   Title Pt will be independent with HEP.    Time 3   Period Weeks   Status New   PT SHORT TERM GOAL #2   Title Improve lumbar flexion by 10 degrees to demonstrate improved mobility of lumbar spine.    Time 3   Period Weeks   Status New   PT SHORT TERM GOAL #3   Title Increase right hip strength by 1/2 grade to improve functional mobility and gait mechanics.    Time 3   Period Weeks   Status New           PT Long Term Goals - 03/13/15 1626    PT LONG TERM GOAL #1   Title Pt will be independent with advanced HEP for core stabilization.    Time 6   Period Weeks   Status New   PT LONG TERM GOAL #2   Title Improve RLE strength to 4+/5 or greater to improve ability to ambulate and climb stairs without pain or difficulty.    Time 6   Period Weeks   Status New   PT LONG TERM GOAL #3   Title Improve lumbar flexion ROM to 80 degrees or greater to demonstrate improved mobility of lumbar spine.    Time 6   Period Weeks   Status New   PT LONG TERM GOAL #4   Title Improve R hip IR and ER ROM by 7 degrees or greater to improve functional mobility.   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    Time 6   Period Weeks   Status New               Plan - 04/03/15 1422    Clinical Impression Statement Pt presented with c/o increased fatigue today, as well as some vertigo sx as a result of his ears  being stopped up from his chest cold. Pt was limited in completing therex today due to positional changes affecting his vertigo. Pt required verbal and tactile cueing with sidelying hip abduction  to prevent him from rolling backward and compensating with hip flexors. Pt requested to leave appt early due to not feeling well, session ended 12 minutes early.    PT Next Visit Plan Continue with manual PRN, core strengthening        Problem List Patient Active Problem List   Diagnosis Date Noted  . Hyperlipidemia   . Cerebrovascular disease   . Thrombocytopenia ([place])   . Erectile dysfunction   . Migraine headache   . Upper gastrointestinal hemorrhage   . Arteriosclerotic cardiovascular disease (ASCVD)   . Hypertension 01/16/2009    Hilma Favors, PT, DPT [phone] 04/03/2015, 2:25 PM  [facility] [address] [place], [place], [address] Phone: [phone]   Fax:  [phone]  Name: Jorge Bishop MRN: [id number] Date of Birth: Jun 04, 1936

## 2015-04-08 ENCOUNTER — Encounter (HOSPITAL_COMMUNITY): Payer: Medicare Other | Admitting: Physical Therapy

## 2015-04-10 ENCOUNTER — Ambulatory Visit (HOSPITAL_COMMUNITY): Payer: Medicare Other | Admitting: Physical Therapy

## 2015-04-15 ENCOUNTER — Encounter (HOSPITAL_COMMUNITY): Payer: Medicare Other | Admitting: Physical Therapy

## 2015-04-17 ENCOUNTER — Encounter (HOSPITAL_COMMUNITY): Payer: Medicare Other

## 2015-04-22 ENCOUNTER — Ambulatory Visit (HOSPITAL_COMMUNITY): Payer: Medicare Other | Admitting: Physical Therapy

## 2015-04-24 ENCOUNTER — Encounter (INDEPENDENT_AMBULATORY_CARE_PROVIDER_SITE_OTHER): Payer: Self-pay | Admitting: Internal Medicine

## 2015-04-24 ENCOUNTER — Ambulatory Visit (HOSPITAL_COMMUNITY): Payer: Medicare Other | Attending: Neurosurgery | Admitting: Physical Therapy

## 2015-04-24 ENCOUNTER — Ambulatory Visit (INDEPENDENT_AMBULATORY_CARE_PROVIDER_SITE_OTHER): Payer: Medicare Other | Admitting: Internal Medicine

## 2015-04-24 VITALS — BP 108/80 | HR 64 | Temp 97.3°F | Ht 72.0 in | Wt 189.5 lb

## 2015-04-24 DIAGNOSIS — M545 Low back pain: Secondary | ICD-10-CM | POA: Diagnosis not present

## 2015-04-24 DIAGNOSIS — K589 Irritable bowel syndrome without diarrhea: Secondary | ICD-10-CM

## 2015-04-24 DIAGNOSIS — I2510 Atherosclerotic heart disease of native coronary artery without angina pectoris: Secondary | ICD-10-CM | POA: Diagnosis not present

## 2015-04-24 DIAGNOSIS — R262 Difficulty in walking, not elsewhere classified: Secondary | ICD-10-CM | POA: Diagnosis present

## 2015-04-24 DIAGNOSIS — R29898 Other symptoms and signs involving the musculoskeletal system: Secondary | ICD-10-CM

## 2015-04-24 DIAGNOSIS — M2560 Stiffness of unspecified joint, not elsewhere classified: Secondary | ICD-10-CM | POA: Diagnosis present

## 2015-04-24 DIAGNOSIS — R6889 Other general symptoms and signs: Secondary | ICD-10-CM | POA: Diagnosis present

## 2015-04-24 NOTE — Patient Instructions (Signed)
Continue the Fiber. OV in 1 year.

## 2015-04-24 NOTE — Progress Notes (Signed)
Subjective:    Patient ID: Jorge Bishop, male    DOB: Oct 05, 1935, 79 y.o.   MRN: [id number]  HPI  Here today for f/u.  He was last seen in May f/u after undergoing a colonoscopy in February for diarrhea. He has been taking Fiber 4grams daily and Imodium as needed. He tells me he is doing good. He says on a scale of 1-9 he is a 9. He is grateful for the Fiber tabs. He tried the [facility] which really did not help. Appetite is goo.d He has lost 4 pounds since his last visit in May. He has just left knee arthroscopy 04/11/2015 an is in Rehab for a torn meniscus. He has a BM every 2 days. He says he alternates between constipation and diarrhea. Denies any melena or BRRB. He says his urgency has resolved.                08/01/2014   Colonoscopy  Indications:  Patient is 14 old Caucasian male with 18 month history of diarrhea. GI pathogen panel was negative and he did not respond to empiric therapy with metronidazole. He is undergoing diagnostic colonoscopy. He has not lost any weight despite his diarrhea.  Impression:   Normal mucosa of terminal ileum. Mild sigmoid diverticulosis. No endoscopic evidence of colitis. Random biopsies taken from mucosa of sigmoid colon looking for microscopic colitis. Small external hemorrhoids.  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 Suspect he has IBS. He became constipated with Imodium and he'll try taking it every other day. Keep stool diary for 2 months and send [place] the summary.         Review of Systems Past Medical History  Diagnosis Date  . Hyperlipidemia     Lipid profile in 12/2009:104, 91, 36, 50  . Cerebrovascular disease     60% bilateral internal carotid artery stenosis in 2012  . Thrombocytopenia (HCC)     platelets of 104 in 03/2009  . Erectile dysfunction   . Migraine headache   . Benign prostatic hypertrophy   . Diverticulosis   . DJD (degenerative joint disease)   . Upper gastrointestinal hemorrhage 74/25   Helicobacter pylori positive; subsequent laparoscopic fundoplication-2009  . Arteriosclerotic cardiovascular disease (ASCVD)     Coronary artery bypass graft surgery in 12/98; DES to the RCA SVG in 07/2002; restenosis of the saphenous vein graft stent and renal intervention in 2006; 70% first diagonal present; calf in 11/2007-total obstruction of the saphenous vein graft to the first marginal  . Right bundle branch block   . Shortness of breath   . GERD (gastroesophageal reflux disease)     Past Surgical History  Procedure Laterality Date  . Coronary artery bypass graft  1998   . Laparoscopic nissen fundoplication  9563, 8756     revision in 2009 along with repair of paraesophagel hernia   . Cardiac catheterization      4 cardiac stents  . Paraesophageal hernia repair    . Hernia repair      right inguinal  . Cataract extraction w/phaco Right 07/25/2012    Procedure: CATARACT EXTRACTION PHACO AND INTRAOCULAR LENS PLACEMENT (IOC);  Surgeon: Tonny Branch, MD;  Location: [facility];  Service: Ophthalmology;  Laterality: Right;  CDE:23.31  . Cataract extraction w/phaco Left 08/18/2012    Procedure: CATARACT EXTRACTION PHACO AND INTRAOCULAR LENS PLACEMENT (IOC);  Surgeon: Tonny Branch, MD;  Location: [facility];  Service: Ophthalmology;  Laterality: Left;  CDE: 16.07  . Colonoscopy N/A 08/01/2014  Procedure: COLONOSCOPY;  Surgeon: Rogene Houston, MD;  Location: [facility];  Service: Endoscopy;  Laterality: N/A;  125 - moved to 10:30 - Ann to notify pt    Allergies  Allergen Reactions  . Bactrim   . Clarithromycin     REACTION: UNKNOWN REACTION  . Codeine     REACTION: UNKNOWN REACTION  . Erythromycin     Current Outpatient Prescriptions on File Prior to Visit  Medication Sig Dispense Refill  . aspirin (ASPIRIN LOW DOSE) 81 MG tablet Take 81 mg by mouth daily.      Marland Kitchen atorvastatin (LIPITOR) 20 MG tablet Take 20 mg by mouth at bedtime.      Marland Kitchen ezetimibe (ZETIA) 10 MG tablet Take 10 mg by mouth  daily.    . Fiber, Guar Gum, CHEW Chew 4 g by mouth daily.    Marland Kitchen labetalol (NORMODYNE) 200 MG tablet Take 200 mg by mouth 2 (two) times daily.      Marland Kitchen loperamide (IMODIUM A-D) 2 MG tablet Take 0.5 tablets (1 mg total) by mouth at bedtime. 1 tablet 0  . niacin (NIASPAN) 1000 MG CR tablet Take 1,500 mg by mouth at bedtime.     Marland Kitchen omeprazole (PRILOSEC) 20 MG capsule Take 20 mg by mouth 2 (two) times daily before a meal.     . OVER THE COUNTER MEDICATION Place 1 drop into both eyes daily. Restore eye drops.    . polycarbophil (FIBERCON) 625 MG tablet Take by mouth daily. 4 gms daily    . Tamsulosin HCl (FLOMAX) 0.4 MG CAPS Take 0.4 mg by mouth daily.     No current facility-administered medications on file prior to visit.        Objective:   Physical Exam  Blood pressure 108/80, pulse 64, temperature 97.3 F (36.3 C), height 6' (1.829 m), weight 189 lb 8 oz (85.957 kg).  Alert and oriented. Skin warm and dry. Oral mucosa is moist.   . Sclera anicteric, conjunctivae is pink. Thyroid not enlarged. No cervical lymphadenopathy. Lungs clear. Heart regular rate and rhythm.  Abdomen is soft. Bowel sounds are positive. No hepatomegaly. No abdominal masses felt. No tenderness.  No edema to lower extremities.        Assessment & Plan:  Diarrhea. Much better since starting the Fiber. He is pleased with results of his stools. OV in 1 week.

## 2015-04-24 NOTE — Therapy (Signed)
[facility] [address] [place], [place], [address] Phone: [phone]   Fax:  [phone]  Physical Therapy Treatment  Patient Details  Name: Jorge Bishop MRN: [id number] Date of Birth: Jan 29, 1936 Referring Provider: Consuella Lose   Encounter Date: 04/24/2015      PT End of Session - 04/24/15 1703    Visit Number 8   Number of Visits 12   Date for PT Re-Evaluation 05/24/15   Authorization Type Medicare   Authorization Time Period 03/13/15-05/08/15   Authorization - Visit Number 8   Authorization - Number of Visits 18   PT Start Time 1300   PT Stop Time 1345   PT Time Calculation (min) 45 min   Activity Tolerance Patient tolerated treatment well   Behavior During Therapy [facility] for tasks assessed/performed      Past Medical History  Diagnosis Date  . Hyperlipidemia     Lipid profile in 12/2009:104, 91, 36, 50  . Cerebrovascular disease     60% bilateral internal carotid artery stenosis in 2012  . Thrombocytopenia (HCC)     platelets of 104 in 03/2009  . Erectile dysfunction   . Migraine headache   . Benign prostatic hypertrophy   . Diverticulosis   . DJD (degenerative joint disease)   . Upper gastrointestinal hemorrhage 33/29     Helicobacter pylori positive; subsequent laparoscopic fundoplication-2009  . Arteriosclerotic cardiovascular disease (ASCVD)     Coronary artery bypass graft surgery in 12/98; DES to the RCA SVG in 07/2002; restenosis of the saphenous vein graft stent and renal intervention in 2006; 70% first diagonal present; calf in 11/2007-total obstruction of the saphenous vein graft to the first marginal  . Right bundle branch block   . Shortness of breath   . GERD (gastroesophageal reflux disease)     Past Surgical History  Procedure Laterality Date  . Coronary artery bypass graft  1998   . Laparoscopic nissen fundoplication  5188, 4166     revision in 2009 along with repair of paraesophagel hernia   .  Cardiac catheterization      4 cardiac stents  . Paraesophageal hernia repair    . Hernia repair      right inguinal  . Cataract extraction w/phaco Right 07/25/2012    Procedure: CATARACT EXTRACTION PHACO AND INTRAOCULAR LENS PLACEMENT (IOC);  Surgeon: Tonny Branch, MD;  Location: [facility];  Service: Ophthalmology;  Laterality: Right;  CDE:23.31  . Cataract extraction w/phaco Left 08/18/2012    Procedure: CATARACT EXTRACTION PHACO AND INTRAOCULAR LENS PLACEMENT (IOC);  Surgeon: Tonny Branch, MD;  Location: [facility];  Service: Ophthalmology;  Laterality: Left;  CDE: 16.07  . Colonoscopy N/A 08/01/2014    Procedure: COLONOSCOPY;  Surgeon: Rogene Houston, MD;  [facility];  Service: Endoscopy;  Laterality: N/A;  125 - moved to 10:30 - Ann to notify pt    There were no vitals filed for this visit.  Visit Diagnosis:  Right low back pain, with sciatica presence unspecified  Weakness of right leg  Joint stiffness of spine  Decreased functional activity tolerance  Difficulty walking      Subjective Assessment - 04/24/15 1307    Subjective Pt reports that he had arthroscopic surgery on his L knee on 04/11/15, and his surgeon told him at the follow up that his ROM is normal, and he does not need to focus that much on his knee. Pt reports that his back pain has improved down to a  2/10, and he cancelled his injection that he had scheduled. He has been walking up to 2 miles without pain.    How long can you sit comfortably? no limitations   How long can you stand comfortably? 4 hours- one shift at work   How long can you walk comfortably? no limitations   Currently in Pain? Yes   Pain Score 2    Pain Location Back            [facility] PT Assessment - 04/24/15 0001    Observation/Other Assessments   Focus on Therapeutic Outcomes (FOTO)  23% limited   AROM   Right Hip External Rotation  48  was 38   Right Hip Internal Rotation  30  was 17   Left Hip External Rotation  54  was 45    Left Hip Internal Rotation  30  was 25   Lumbar Flexion 78  was 78   Lumbar Extension 28  was 28   Lumbar - Right Side Bend 35  was 26   Lumbar - Left Side Bend 35  was 12   Strength   Right Hip Flexion 4+/5  was 3   Right Hip Extension 4-/5  was 4-   Right Hip ABduction 4+/5  was 4+   Left Hip Flexion 4+/5  was 4   Left Hip Extension 4/5  was 3+   Left Hip ABduction 4+/5  was 4+   Right Knee Flexion 4+/5  was 4   Right Knee Extension 5/5  was 4   Left Knee Flexion 4+/5  was 4+   Left Knee Extension 4+/5  was 5   Transfers   Five time sit to stand comments  7.29"  was 12.46                     [facility] Adult PT Treatment/Exercise - 04/24/15 0001    Lumbar Exercises: Stretches   Active Hamstring Stretch 3 reps;30 seconds   Active Hamstring Stretch Limitations 14" step   Piriformis Stretch 3 reps;30 seconds   Piriformis Stretch Limitations seated    Lumbar Exercises: Standing   Forward Lunge 10 reps   Forward Lunge Limitations 4 in box   Side Lunge 10 reps;Limitations   Side Lunge Limitations 4 in box   Wall Slides 15 reps   Other Standing Lumbar Exercises sidestepping with BTB x 3 RT   Other Standing Lumbar Exercises oblique punches with GTB                   PT Short Term Goals - 04/24/15 1331    PT SHORT TERM GOAL #1   Title Pt will be independent with HEP.    Time 3   Period Weeks   Status Achieved   PT SHORT TERM GOAL #2   Title Improve lumbar flexion by 10 degrees to demonstrate improved mobility of lumbar spine.    Time 3   Period Weeks   Status Achieved   PT SHORT TERM GOAL #3   Title Increase right hip strength by 1/2 grade to improve functional mobility and gait mechanics.    Time 3   Period Weeks   Status Achieved           PT Long Term Goals - 04/24/15 1332    PT LONG TERM GOAL #1   Title Pt will be independent with advanced HEP for core stabilization.    Time 6   Period Weeks  Status On-going   PT LONG  TERM GOAL #2   Title Improve RLE strength to 4+/5 or greater to improve ability to ambulate and climb stairs without pain or difficulty.    Time 6   Period Weeks   Status On-going   PT LONG TERM GOAL #3   Title Improve lumbar flexion ROM to 80 degrees or greater to demonstrate improved mobility of lumbar spine.    Time 6   Period Weeks   Status On-going   PT LONG TERM GOAL #4   Title Improve R hip IR and ER ROM by 7 degrees or greater to improve functional mobility.   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    Time 6   Period Weeks   Status On-going               Plan - May 23, 2015 1706    Clinical Impression Statement Reassessment completed today. Pt demonstrates slight decrease in L knee strength as a result of arthroscopic surgery, however, he demonstrates good progress in other areas, including lumbar ROM, hip ROM, and R hip strength. Pt will benefit from continued PT services x 4 more visits to further improve core and BLE strength and to begin functional lifting training.    Pt will benefit from skilled therapeutic intervention in order to improve on the following deficits Abnormal gait;Decreased activity tolerance;Decreased endurance;Decreased mobility;Decreased range of motion;Decreased strength;Difficulty walking;Postural dysfunction   PT Treatment/Interventions ADLs/Self Care Home Management;Moist Heat;Traction;Gait training;Stair training;Functional mobility training;Therapeutic activities;Therapeutic exercise;Balance training;Neuromuscular re-education;Patient/family education;Manual techniques   PT Next Visit Plan Continue with LE and core strengthening          G-Codes - May 23, 2015 1705    Functional Assessment Tool Used FOTO   Functional Limitation Mobility: Walking and  moving around   Mobility: Walking and Moving Around Current Status [phone]) At least 20 percent but less than 40 percent impaired, limited or restricted   Mobility: Walking and Moving Around Goal Status [phone]) At least 1 percent but less than 20 percent impaired, limited or restricted      Problem List Patient Active Problem List   Diagnosis Date Noted  . Hyperlipidemia   . Cerebrovascular disease   . Thrombocytopenia ([place])   . Erectile dysfunction   . Migraine headache   . Upper gastrointestinal hemorrhage   . Arteriosclerotic cardiovascular disease (ASCVD)   . Hypertension 01/16/2009   Physical Therapy Progress Note  Dates of Reporting Period: 03/13/15 to 23-May-2015  Objective Reports of Subjective Statement: Pt demonstrates improvements in functional strength, ROM, mobility, and functional activity tolerance.   Objective Measurements: see above  Goal Update: see above   Plan: Continue with POC x 4 more sessions, focusing on progressing HEP for core strength and BLE strengthening.   Reason Skilled Services are Required: Continuation of skilled PT services are necessary at this time to address weakness in core and BLE and to begin functional lifting in order to return pt to optimal level of function and to allow him to return to work.     Hilma Favors, PT, DPT [phone] 2015-05-23, 5:10 PM  [facility] [address] [place], [place], [address] Phone: [phone]   Fax:  [phone]  Name: Jorge Bishop MRN: [id number] Date of Birth: 04-13-1936

## 2015-04-29 ENCOUNTER — Ambulatory Visit (HOSPITAL_COMMUNITY): Payer: Medicare Other | Admitting: Physical Therapy

## 2015-04-29 DIAGNOSIS — R262 Difficulty in walking, not elsewhere classified: Secondary | ICD-10-CM

## 2015-04-29 DIAGNOSIS — M545 Low back pain: Secondary | ICD-10-CM | POA: Diagnosis not present

## 2015-04-29 DIAGNOSIS — M2560 Stiffness of unspecified joint, not elsewhere classified: Secondary | ICD-10-CM

## 2015-04-29 DIAGNOSIS — R29898 Other symptoms and signs involving the musculoskeletal system: Secondary | ICD-10-CM

## 2015-04-29 DIAGNOSIS — R6889 Other general symptoms and signs: Secondary | ICD-10-CM

## 2015-04-29 NOTE — Therapy (Signed)
[facility] [place], [place], [address] Phone: [phone]   Fax:  [phone]  Physical Therapy Treatment  Patient Details  Name: Jorge Bishop MRN: [id number] Date of Birth: 1935-10-20 Referring Provider: Consuella Lose   Encounter Date: 04/29/2015      PT End of Session - 04/29/15 1435    Visit Number 9   Number of Visits 12   Date for PT Re-Evaluation 05/24/15   Authorization Type Medicare   Authorization Time Period 03/13/15-05/08/15   Authorization - Visit Number 9   Authorization - Number of Visits 18   PT Start Time [id number]   PT Stop Time 1426   PT Time Calculation (min) 41 min   Activity Tolerance Patient tolerated treatment well   Behavior During Therapy [facility] for tasks assessed/performed      Past Medical History  Diagnosis Date  . Hyperlipidemia     Lipid profile in 12/2009:104, 91, 36, 50  . Cerebrovascular disease     60% bilateral internal carotid artery stenosis in 2012  . Thrombocytopenia (HCC)     platelets of 104 in 03/2009  . Erectile dysfunction   . Migraine headache   . Benign prostatic hypertrophy   . Diverticulosis   . DJD (degenerative joint disease)   . Upper gastrointestinal hemorrhage [id number]     Helicobacter pylori positive; subsequent laparoscopic fundoplication-2009  . Arteriosclerotic cardiovascular disease (ASCVD)     Coronary artery bypass graft surgery in 12/98; DES to the RCA SVG in 07/2002; restenosis of the saphenous vein graft stent and renal intervention in 2006; 70% first diagonal present; calf in 11/2007-total obstruction of the saphenous vein graft to the first marginal  . Right bundle branch block   . Shortness of breath   . GERD (gastroesophageal reflux disease)     Past Surgical History  Procedure Laterality Date  . Coronary artery bypass graft  1998   . Laparoscopic nissen fundoplication  0000000, [id number]     revision in 2009 along with repair of paraesophagel hernia   .  Cardiac catheterization      4 cardiac stents  . Paraesophageal hernia repair    . Hernia repair      right inguinal  . Cataract extraction w/phaco Right 07/25/2012    Procedure: CATARACT EXTRACTION PHACO AND INTRAOCULAR LENS PLACEMENT (IOC);  Surgeon: Tonny Branch, MD;  Location: [facility];  Service: Ophthalmology;  Laterality: Right;  CDE:23.31  . Cataract extraction w/phaco Left 08/18/2012    Procedure: CATARACT EXTRACTION PHACO AND INTRAOCULAR LENS PLACEMENT (IOC);  Surgeon: Tonny Branch, MD;  Location: [facility];  Service: Ophthalmology;  Laterality: Left;  CDE: 16.07  . Colonoscopy N/A 08/01/2014    Procedure: COLONOSCOPY;  Surgeon: Rogene Houston, MD;  [facility];  Service: Endoscopy;  Laterality: N/A;  125 - moved to 10:30 - Ann to notify pt    There were no vitals filed for this visit.  Visit Diagnosis:  Right low back pain, with sciatica presence unspecified  Weakness of right leg  Joint stiffness of spine  Decreased functional activity tolerance  Difficulty walking      Subjective Assessment - 04/29/15 1348    Subjective Patient reports that he is doing well today, still having low levels of pain and would like to focus on his knee. Walked 3 miles yesterday.    Currently in Pain? Yes   Pain Score 2    Pain Location Back  back and knee  Pain Orientation Left                         [facility] Adult PT Treatment/Exercise - 04/29/15 0001    Lumbar Exercises: Stretches   Active Hamstring Stretch 3 reps;30 seconds   Active Hamstring Stretch Limitations 12 inch step    Passive Hamstring Stretch 3 reps;30 seconds   Passive Hamstring Stretch Limitations gastroc on slantboard    Lower Trunk Rotation 5 reps;10 seconds   Quadruped Mid Back Stretch 3 reps;30 seconds   Quadruped Mid Back Stretch Limitations Hip adductor stretch on 12 inch box    Piriformis Stretch 3 reps;30 seconds   Piriformis Stretch Limitations seated    Lumbar Exercises: Standing   Heel  Raises 15 reps   Heel Raises Limitations toe and heel raises    Forward Lunge 15 reps   Forward Lunge Limitations 4 inch box    Side Lunge 15 reps   Side Lunge Limitations 4 inch box    Other Standing Lumbar Exercises 3D hip excursions 1x15   Other Standing Lumbar Exercises functional squats 1x10   Lumbar Exercises: Seated   Other Seated Lumbar Exercises seated opposite alternating UE and LE flexion with ab sets 1x15   Lumbar Exercises: Supine   Other Supine Lumbar Exercises dead bugs 1x15    Lumbar Exercises: Sidelying   Hip Abduction 15 reps   Hip Abduction Weights (lbs) 2   Lumbar Exercises: Prone   Straight Leg Raise 10 reps   Straight Leg Raises Limitations 2                PT Education - 04/29/15 1435    Education provided No          PT Short Term Goals - 04/24/15 1331    PT SHORT TERM GOAL #1   Title Pt will be independent with HEP.    Time 3   Period Weeks   Status Achieved   PT SHORT TERM GOAL #2   Title Improve lumbar flexion by 10 degrees to demonstrate improved mobility of lumbar spine.    Time 3   Period Weeks   Status Achieved   PT SHORT TERM GOAL #3   Title Increase right hip strength by 1/2 grade to improve functional mobility and gait mechanics.    Time 3   Period Weeks   Status Achieved           PT Long Term Goals - 04/24/15 1332    PT LONG TERM GOAL #1   Title Pt will be independent with advanced HEP for core stabilization.    Time 6   Period Weeks   Status On-going   PT LONG TERM GOAL #2   Title Improve RLE strength to 4+/5 or greater to improve ability to ambulate and climb stairs without pain or difficulty.    Time 6   Period Weeks   Status On-going   PT LONG TERM GOAL #3   Title Improve lumbar flexion ROM to 80 degrees or greater to demonstrate improved mobility of lumbar spine.    Time 6   Period Weeks   Status On-going   PT LONG TERM GOAL #4   Title Improve R hip IR and ER ROM by 7 degrees or greater to improve  functional mobility.   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    Time 6   Period Weeks   Status On-going               Plan - 04/29/15 1435    Clinical Impression Statement Continued to focus on functional strength of core and lower extermities today with great tolerance of session by patient today; patient able to perform all exercises with good form except for squats today, for which he did require some cues to correct form. Pain reduced at end of session.    Pt will benefit from skilled therapeutic intervention in order to improve on the following deficits Abnormal gait;Decreased activity tolerance;Decreased endurance;Decreased mobility;Decreased range of motion;Decreased strength;Difficulty walking;Postural dysfunction   Rehab Potential Good   PT Frequency 2x / week   PT Duration 6 weeks   PT Treatment/Interventions ADLs/Self Care Home Management;Moist Heat;Traction;Gait training;Stair training;Functional mobility training;Therapeutic activities;Therapeutic exercise;Balance training;Neuromuscular re-education;Patient/family education;Manual techniques   PT Next Visit Plan Continue with LE and core strengthening; begin functional lifting training    PT Home Exercise Plan Optional addition of seated marching.    Consulted and Agree with Plan of Care Patient        Problem List Patient Active Problem List   Diagnosis Date Noted  . Hyperlipidemia   . Cerebrovascular disease   . Thrombocytopenia ([place])   . Erectile dysfunction   . Migraine headache   . Upper gastrointestinal hemorrhage   . Arteriosclerotic cardiovascular disease (ASCVD)   . Hypertension 01/16/2009    Deniece Ree PT, DPT [phone]  [facility] [place], [place], [address] Phone: [phone]   Fax:  [phone]  Name: DRAGAN JAUREQUI MRN: [id number] Date of Birth: 03/18/1936

## 2015-05-01 ENCOUNTER — Encounter (HOSPITAL_COMMUNITY): Payer: Medicare Other

## 2015-05-02 ENCOUNTER — Ambulatory Visit (HOSPITAL_COMMUNITY): Payer: Medicare Other

## 2015-05-02 DIAGNOSIS — R262 Difficulty in walking, not elsewhere classified: Secondary | ICD-10-CM

## 2015-05-02 DIAGNOSIS — R6889 Other general symptoms and signs: Secondary | ICD-10-CM

## 2015-05-02 DIAGNOSIS — R29898 Other symptoms and signs involving the musculoskeletal system: Secondary | ICD-10-CM

## 2015-05-02 DIAGNOSIS — M545 Low back pain: Secondary | ICD-10-CM

## 2015-05-02 DIAGNOSIS — M2560 Stiffness of unspecified joint, not elsewhere classified: Secondary | ICD-10-CM

## 2015-05-02 NOTE — Therapy (Signed)
[facility] [address] [place], [place], [address] Phone: [phone]   Fax:  [phone]  Physical Therapy Treatment  Patient Details  Name: Jorge Bishop MRN: [id number] Date of Birth: 1935/09/11 Referring Provider: Consuella Lose   Encounter Date: 05/02/2015      PT End of Session - 05/02/15 1354    Visit Number 10   Number of Visits 12   Date for PT Re-Evaluation 05/24/15   Authorization Type Medicare   Authorization Time Period 03/13/15-05/08/15   Authorization - Visit Number 10   Authorization - Number of Visits 18   PT Start Time [id number]   PT Stop Time 1428   PT Time Calculation (min) 42 min   Activity Tolerance Patient tolerated treatment well   Behavior During Therapy [facility] for tasks assessed/performed      Past Medical History  Diagnosis Date  . Hyperlipidemia     Lipid profile in 12/2009:104, 91, 36, 50  . Cerebrovascular disease     60% bilateral internal carotid artery stenosis in 2012  . Thrombocytopenia (HCC)     platelets of 104 in 03/2009  . Erectile dysfunction   . Migraine headache   . Benign prostatic hypertrophy   . Diverticulosis   . DJD (degenerative joint disease)   . Upper gastrointestinal hemorrhage [id number]     Helicobacter pylori positive; subsequent laparoscopic fundoplication-2009  . Arteriosclerotic cardiovascular disease (ASCVD)     Coronary artery bypass graft surgery in 12/98; DES to the RCA SVG in 07/2002; restenosis of the saphenous vein graft stent and renal intervention in 2006; 70% first diagonal present; calf in 11/2007-total obstruction of the saphenous vein graft to the first marginal  . Right bundle branch block   . Shortness of breath   . GERD (gastroesophageal reflux disease)     Past Surgical History  Procedure Laterality Date  . Coronary artery bypass graft  1998   . Laparoscopic nissen fundoplication  0000000, [id number]     revision in 2009 along with repair of paraesophagel hernia    . Cardiac catheterization      4 cardiac stents  . Paraesophageal hernia repair    . Hernia repair      right inguinal  . Cataract extraction w/phaco Right 07/25/2012    Procedure: CATARACT EXTRACTION PHACO AND INTRAOCULAR LENS PLACEMENT (IOC);  Surgeon: Tonny Branch, MD;  Location: [facility];  Service: Ophthalmology;  Laterality: Right;  CDE:23.31  . Cataract extraction w/phaco Left 08/18/2012    Procedure: CATARACT EXTRACTION PHACO AND INTRAOCULAR LENS PLACEMENT (IOC);  Surgeon: Tonny Branch, MD;  Location: [facility];  Service: Ophthalmology;  Laterality: Left;  CDE: 16.07  . Colonoscopy N/A 08/01/2014    Procedure: COLONOSCOPY;  Surgeon: Rogene Houston, MD;  Location: [facility];  Service: Endoscopy;  Laterality: N/A;  125 - moved to 10:30 - Ann to notify pt    There were no vitals filed for this visit.  Visit Diagnosis:  Right low back pain, with sciatica presence unspecified  Weakness of right leg  Joint stiffness of spine  Decreased functional activity tolerance  Difficulty walking      Subjective Assessment - 05/02/15 1350    Subjective Pt reports going to neurosurgeon yesterday.  Stated pain continues to be a 2/10 for lower back and Lt knee.     Currently in Pain? Yes   Pain Score 2    Pain Location Back   Pain Orientation Lower   Pain Descriptors / Indicators  Sore             [facility] Adult PT Treatment/Exercise - 05/02/15 0001    Lumbar Exercises: Stretches   Active Hamstring Stretch 3 reps;30 seconds   Active Hamstring Stretch Limitations 12 inch step    Piriformis Stretch 3 reps;30 seconds   Piriformis Stretch Limitations seated    Lumbar Exercises: Standing   Heel Raises Limitations proper lifting red theraball from 14in step then heel raise    Functional Squats 10 reps  proper lifting red theraball from 14in step then heel raise    Lifting From 12";10 reps   Lifting Weights (lbs) --  Red theraball   Forward Lunge 15 reps   Forward Lunge Limitations 4 inch box  no HHA   Side Lunge 15 reps   Side Lunge Limitations 4 inch box    Other Standing Lumbar Exercises 3D hip excursions 1x15   Other Standing Lumbar Exercises sidestepping 2RT, oblique punches with GTB 15x   Lumbar Exercises: Seated   Long Arc Quad on [place] Both;10 reps  alternating   Hip Flexion on Ball Both;10 reps  opposite UE/ LE 5" holds    Lumbar Exercises: Quadruped   Single Arm Raise 10 reps   Straight Leg Raise 10 reps   Straight Leg Raises Limitations extension and abduction BLE            PT Short Term Goals - 04/24/15 1331    PT SHORT TERM GOAL #1   Title Pt will be independent with HEP.    Time 3   Period Weeks   Status Achieved   PT SHORT TERM GOAL #2   Title Improve lumbar flexion by 10 degrees to demonstrate improved mobility of lumbar spine.    Time 3   Period Weeks   Status Achieved   PT SHORT TERM GOAL #3   Title Increase right hip strength by 1/2 grade to improve functional mobility and gait mechanics.    Time 3   Period Weeks   Status Achieved           PT Long Term Goals - 04/24/15 1332    PT LONG TERM GOAL #1   Title Pt will be independent with advanced HEP for core stabilization.    Time 6   Period Weeks   Status On-going   PT LONG TERM GOAL #2   Title Improve RLE strength to 4+/5 or greater to improve ability to ambulate and climb stairs without pain or difficulty.    Time 6   Period Weeks   Status On-going   PT LONG TERM GOAL #3   Title Improve lumbar flexion ROM to 80 degrees or greater to demonstrate improved mobility of lumbar spine.    Time 6   Period Weeks   Status On-going   PT LONG TERM GOAL #4   Title Improve R hip IR and ER ROM by 7 degrees or greater to improve functional mobility.   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     Time 6   Period Weeks   Status On-going               Plan - 05/02/15 1401    Clinical Impression Statement Progressed functional strengthening with  proper lifting with minimal cueing for proper form and technique.  Pt able to perform all exercises with good form with minimal cueing for form.  Progressed core and LE strengthening with seated ball exercises and quadruped exercises with cueing for stabilty.  No reports of increased pain through    PT Next Visit Plan Continue with LE and core strengthening x 2 more sessions.          Problem List Patient Active Problem List   Diagnosis Date Noted  . Hyperlipidemia   . Cerebrovascular disease   . Thrombocytopenia ([place])   . Erectile dysfunction   . Migraine headache   . Upper gastrointestinal hemorrhage   . Arteriosclerotic cardiovascular disease (ASCVD)   . Hypertension 01/16/2009   Jorge Bishop, [place]; [place]  Aldona Lento 05/02/2015, 2:28 PM  [facility] [address] [place], [place], [address] Phone: [phone]   Fax:  [phone]  Name: Jorge Bishop MRN: [id number] Date of Birth: October 05, 1935

## 2015-05-06 ENCOUNTER — Ambulatory Visit (HOSPITAL_COMMUNITY): Payer: Medicare Other | Admitting: Physical Therapy

## 2015-05-06 DIAGNOSIS — M2560 Stiffness of unspecified joint, not elsewhere classified: Secondary | ICD-10-CM

## 2015-05-06 DIAGNOSIS — R262 Difficulty in walking, not elsewhere classified: Secondary | ICD-10-CM

## 2015-05-06 DIAGNOSIS — M545 Low back pain: Secondary | ICD-10-CM | POA: Diagnosis not present

## 2015-05-06 DIAGNOSIS — R6889 Other general symptoms and signs: Secondary | ICD-10-CM

## 2015-05-06 DIAGNOSIS — R29898 Other symptoms and signs involving the musculoskeletal system: Secondary | ICD-10-CM

## 2015-05-06 NOTE — Therapy (Signed)
[facility] [address] [place], [place], [address] Phone: [phone]   Fax:  [phone]  Physical Therapy Treatment  Patient Details  Name: Jorge Bishop MRN: [id number] Date of Birth: 1936/02/07 Referring Provider: Consuella Lose   Encounter Date: 05/06/2015      PT End of Session - 05/06/15 1431    Visit Number 11   Number of Visits 12   Date for PT Re-Evaluation 05/24/15   Authorization Type Medicare   Authorization Time Period 03/13/15-05/08/15   Authorization - Visit Number 11   Authorization - Number of Visits 18   PT Start Time [id number]   PT Stop Time 1428   PT Time Calculation (min) 41 min   Activity Tolerance Patient tolerated treatment well   Behavior During Therapy [facility] for tasks assessed/performed      Past Medical History  Diagnosis Date  . Hyperlipidemia     Lipid profile in 12/2009:104, 91, 36, 50  . Cerebrovascular disease     60% bilateral internal carotid artery stenosis in 2012  . Thrombocytopenia (HCC)     platelets of 104 in 03/2009  . Erectile dysfunction   . Migraine headache   . Benign prostatic hypertrophy   . Diverticulosis   . DJD (degenerative joint disease)   . Upper gastrointestinal hemorrhage [id number]     Helicobacter pylori positive; subsequent laparoscopic fundoplication-2009  . Arteriosclerotic cardiovascular disease (ASCVD)     Coronary artery bypass graft surgery in 12/98; DES to the RCA SVG in 07/2002; restenosis of the saphenous vein graft stent and renal intervention in 2006; 70% first diagonal present; calf in 11/2007-total obstruction of the saphenous vein graft to the first marginal  . Right bundle branch block   . Shortness of breath   . GERD (gastroesophageal reflux disease)     Past Surgical History  Procedure Laterality Date  . Coronary artery bypass graft  1998   . Laparoscopic nissen fundoplication  0000000, [id number]     revision in 2009 along with repair of paraesophagel hernia    . Cardiac catheterization      4 cardiac stents  . Paraesophageal hernia repair    . Hernia repair      right inguinal  . Cataract extraction w/phaco Right 07/25/2012    Procedure: CATARACT EXTRACTION PHACO AND INTRAOCULAR LENS PLACEMENT (IOC);  Surgeon: Tonny Branch, MD;  Location: [facility];  Service: Ophthalmology;  Laterality: Right;  CDE:23.31  . Cataract extraction w/phaco Left 08/18/2012    Procedure: CATARACT EXTRACTION PHACO AND INTRAOCULAR LENS PLACEMENT (IOC);  Surgeon: Tonny Branch, MD;  Location: [facility];  Service: Ophthalmology;  Laterality: Left;  CDE: 16.07  . Colonoscopy N/A 08/01/2014    Procedure: COLONOSCOPY;  Surgeon: Rogene Houston, MD;  [facility];  Service: Endoscopy;  Laterality: N/A;  125 - moved to 10:30 - Ann to notify pt    There were no vitals filed for this visit.  Visit Diagnosis:  Right low back pain, with sciatica presence unspecified  Weakness of right leg  Joint stiffness of spine  Decreased functional activity tolerance  Difficulty walking      Subjective Assessment - 05/06/15 1348    Subjective Patient reports he is feeling well today, went home and took a nap after PT last session    Currently in Pain? No/denies                         [facility] Adult  PT Treatment/Exercise - 05/06/15 0001    Lumbar Exercises: Stretches   Active Hamstring Stretch 3 reps;30 seconds   Active Hamstring Stretch Limitations 14 inch box    Passive Hamstring Stretch 3 reps;30 seconds   Passive Hamstring Stretch Limitations gastroc on slantboard    Quadruped Mid Back Stretch --   Quadruped Mid Back Stretch Limitations --   Piriformis Stretch 3 reps;30 seconds   Piriformis Stretch Limitations seated    Lumbar Exercises: Standing   Heel Raises 15 reps   Heel Raises Limitations proper lifting red theraball from 14in step then heel raise   also heel and toe raises x20   Forward Lunge 10 reps   Forward Lunge Limitations 2 inch box    Side Lunge  10 reps   Side Lunge Limitations 2 inch box    Other Standing Lumbar Exercises 3D hip excursions 1x15   Other Standing Lumbar Exercises hip ABD walks 2x37ft    Lumbar Exercises: Seated   Other Seated Lumbar Exercises on air pad: alternating opposte UE/LE flexion; contralateral crossmildline cone reaches on air pad; playing catch while seated on air pad for core activation    Knee/Hip Exercises: Seated   Other Seated Knee/Hip Exercises horse pulls and pushes for hamstrings and quads 2x243ft                 PT Education - 05/06/15 1430    Education provided Yes   Education Details what to expect next session due to DC    Person(s) Educated Patient   Methods Explanation   Comprehension Verbalized understanding          PT Short Term Goals - 04/24/15 1331    PT SHORT TERM GOAL #1   Title Pt will be independent with HEP.    Time 3   Period Weeks   Status Achieved   PT SHORT TERM GOAL #2   Title Improve lumbar flexion by 10 degrees to demonstrate improved mobility of lumbar spine.    Time 3   Period Weeks   Status Achieved   PT SHORT TERM GOAL #3   Title Increase right hip strength by 1/2 grade to improve functional mobility and gait mechanics.    Time 3   Period Weeks   Status Achieved           PT Long Term Goals - 04/24/15 1332    PT LONG TERM GOAL #1   Title Pt will be independent with advanced HEP for core stabilization.    Time 6   Period Weeks   Status On-going   PT LONG TERM GOAL #2   Title Improve RLE strength to 4+/5 or greater to improve ability to ambulate and climb stairs without pain or difficulty.    Time 6   Period Weeks   Status On-going   PT LONG TERM GOAL #3   Title Improve lumbar flexion ROM to 80 degrees or greater to demonstrate improved mobility of lumbar spine.    Time 6   Period Weeks   Status On-going   PT LONG TERM GOAL #4   Title Improve R hip IR and ER ROM by 7 degrees or greater to improve functional mobility.   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    Time 6   Period Weeks   Status On-going               Plan - 05/06/15 1431    Clinical Impression Statement Continued to focus on core and lower extremity strengthening today with good tolerance by patient; did display some difficulty with core endurance based activities on air pad but able to compelte correctly. Introduced horse pushes and pulls for lower extremity strength and endurance today. Patient able to perform all activities well today but was fatigued at end of session.    Pt will benefit from skilled therapeutic intervention in order to improve on the following deficits Abnormal gait;Decreased activity tolerance;Decreased endurance;Decreased mobility;Decreased range of motion;Decreased strength;Difficulty walking;Postural dysfunction   Rehab Potential Good   PT Frequency 2x / week   PT Duration 6 weeks   PT Treatment/Interventions ADLs/Self Care Home Management;Moist Heat;Traction;Gait training;Stair training;Functional mobility training;Therapeutic activities;Therapeutic exercise;Balance training;Neuromuscular re-education;Patient/family education;Manual techniques   PT Next Visit Plan DC with advanced HEP    PT Home Exercise Plan Optional addition of seated marching.    Consulted and Agree with Plan of Care Patient        Problem List Patient Active Problem List   Diagnosis Date Noted  . Hyperlipidemia   . Cerebrovascular disease   . Thrombocytopenia ([place])   . Erectile dysfunction   . Migraine headache   . Upper gastrointestinal hemorrhage   . Arteriosclerotic cardiovascular disease (ASCVD)   . Hypertension 01/16/2009    Deniece Ree PT, DPT [id number]  [place], [place], [address] Phone: [phone]   Fax:  [phone]  Name: Jorge Bishop MRN: [id number] Date of Birth: [birth year]

## 2015-05-07 ENCOUNTER — Ambulatory Visit (HOSPITAL_COMMUNITY): Payer: Medicare Other | Admitting: Physical Therapy

## 2015-05-07 DIAGNOSIS — R29898 Other symptoms and signs involving the musculoskeletal system: Secondary | ICD-10-CM

## 2015-05-07 DIAGNOSIS — M545 Low back pain: Secondary | ICD-10-CM

## 2015-05-07 DIAGNOSIS — R262 Difficulty in walking, not elsewhere classified: Secondary | ICD-10-CM

## 2015-05-07 DIAGNOSIS — R6889 Other general symptoms and signs: Secondary | ICD-10-CM

## 2015-05-07 DIAGNOSIS — M2560 Stiffness of unspecified joint, not elsewhere classified: Secondary | ICD-10-CM

## 2015-05-07 NOTE — Patient Instructions (Signed)
   SINGLE LEG BRIDGE - MODIFIED  While lying on your back, raise your buttocks off the floor/bed into a bridge position.    Next straighten a leg so that only one leg is supporting your body. Then, return that leg back to the ground and change to the other side.      Try and maintain your pelvis level the entire time.  Repeat 10 times each leg, twice a day.     DO ON A CHAIR- SEATED ALTERNATE ARM AND LEG   While sitting on a inverted Bosu, raise up an arm and opposite leg. Do not let your pelvis or spine move while performing.   Repeat 10 times each side, twice a day.    LUNGE FORWARD AND SIDEWAYS - on a stair  While standing on the ground with a step in front of you, place your foot foward and onto a step as shown.  If you can tolerate doing this on the ground, with no increase in pain, you can do this as well.   Allow your front and back knees to bend as you lower your back knee towards the ground into a lunge position. Do not allow your front knee to pass your toes.   Return to the original position and then perform with the other leg.   Repeat 10 times forward and 10 times sideways each leg, twice a day.    SQUATS  While standing with feet shoulder width apart and in front of a stable support for balance assist if needed, bend your knees and lower your body towards the floor. Your body weight should mostly be directed through the heels of your feet. Return to a standing position.   Knees should bend in line with the 2nd toe and not pass the front of the foot.  Repeat 10 times a day, twice a day. Lower your reps if you have knee pain.

## 2015-05-07 NOTE — Therapy (Signed)
[facility] [address] [place], [place], [address] Phone: [phone]   Fax:  [phone]  Physical Therapy Treatment (Discharge)  Patient Details  Name: Jorge Bishop MRN: [id number] Date of Birth: 1936-04-01 Referring Provider: Consuella Lose   Encounter Date: 05/07/2015      PT End of Session - 05/07/15 1713    Visit Number 12   Number of Visits 12   Authorization Type Medicare   Authorization Time Period 03/13/15-05/08/15   Authorization - Visit Number 12   Authorization - Number of Visits 18   PT Start Time 7530   PT Stop Time 0511   PT Time Calculation (min) 45 min   Activity Tolerance Patient tolerated treatment well   Behavior During Therapy [facility] for tasks assessed/performed      Past Medical History  Diagnosis Date  . Hyperlipidemia     Lipid profile in 12/2009:104, 91, 36, 50  . Cerebrovascular disease     60% bilateral internal carotid artery stenosis in 2012  . Thrombocytopenia (HCC)     platelets of 104 in 03/2009  . Erectile dysfunction   . Migraine headache   . Benign prostatic hypertrophy   . Diverticulosis   . DJD (degenerative joint disease)   . Upper gastrointestinal hemorrhage 02/11     Helicobacter pylori positive; subsequent laparoscopic fundoplication-2009  . Arteriosclerotic cardiovascular disease (ASCVD)     Coronary artery bypass graft surgery in 12/98; DES to the RCA SVG in 07/2002; restenosis of the saphenous vein graft stent and renal intervention in 2006; 70% first diagonal present; calf in 11/2007-total obstruction of the saphenous vein graft to the first marginal  . Right bundle branch block   . Shortness of breath   . GERD (gastroesophageal reflux disease)     Past Surgical History  Procedure Laterality Date  . Coronary artery bypass graft  1998   . Laparoscopic nissen fundoplication  1735, 6701     revision in 2009 along with repair of paraesophagel hernia   . Cardiac catheterization       4 cardiac stents  . Paraesophageal hernia repair    . Hernia repair      right inguinal  . Cataract extraction w/phaco Right 07/25/2012    Procedure: CATARACT EXTRACTION PHACO AND INTRAOCULAR LENS PLACEMENT (IOC);  Surgeon: Tonny Branch, MD;  Location: [facility];  Service: Ophthalmology;  Laterality: Right;  CDE:23.31  . Cataract extraction w/phaco Left 08/18/2012    Procedure: CATARACT EXTRACTION PHACO AND INTRAOCULAR LENS PLACEMENT (IOC);  Surgeon: Tonny Branch, MD;  Location: [facility];  Service: Ophthalmology;  Laterality: Left;  CDE: 16.07  . Colonoscopy N/A 08/01/2014    Procedure: COLONOSCOPY;  Surgeon: Rogene Houston, MD;  Location: [facility];  Service: Endoscopy;  Laterality: N/A;  125 - moved to 10:30 - Ann to notify pt    There were no vitals filed for this visit.  Visit Diagnosis:  Right low back pain, with sciatica presence unspecified  Weakness of right leg  Joint stiffness of spine  Decreased functional activity tolerance  Difficulty walking      Subjective Assessment - 05/07/15 1305    Subjective Patient reports that he is feeling well today, still having a little bit of pain in his back but his knee doesn't really bother him    How long can you sit comfortably? no limitations   How long can you stand comfortably? 11/22- no limits    How long can you walk comfortably?  no limitations   Patient Stated Goals Decrease pain   Currently in Pain? Yes   Pain Score 2    Pain Location Back   Pain Orientation Lower            [facility] PT Assessment - 05/07/15 0001    Observation/Other Assessments   Focus on Therapeutic Outcomes (FOTO)  23% limited    AROM   Right Hip External Rotation  --  wfl    Right Hip Internal Rotation  --  functional stiffness   Left Hip External Rotation  --  wfl    Lumbar Flexion 58   Lumbar Extension 22   Lumbar - Right Side Bend --  fingers to knee midline    Lumbar - Left Side Bend --  fingers to knee midline    Strength   Right  Hip Flexion 5/5   Right Hip Extension 4-/5   Right Hip ABduction 4+/5   Left Hip Flexion 5/5   Left Hip Extension 4-/5   Left Hip ABduction 4/5   Right Knee Flexion 4+/5   Right Knee Extension 5/5   Left Knee Flexion 4+/5   Left Knee Extension 5/5   Transfers   Five time sit to stand comments  8.44                     [facility] Adult PT Treatment/Exercise - 05/07/15 0001    Lumbar Exercises: Stretches   Active Hamstring Stretch 3 reps;30 seconds   Active Hamstring Stretch Limitations 12 inch    Single Knee to Chest Stretch 5 reps;10 seconds   Lower Trunk Rotation 5 reps;10 seconds   Piriformis Stretch 2 reps;30 seconds   Piriformis Stretch Limitations seated                PT Education - 05/07/15 1713    Education provided Yes   Education Details DC today, advanced HEP    Person(s) Educated Patient   Methods Explanation;Handout   Comprehension Verbalized understanding;Returned demonstration          PT Short Term Goals - 05/07/15 1328    PT SHORT TERM GOAL #1   Title Pt will be independent with HEP.    Time 3   Period Weeks   Status Achieved   PT SHORT TERM GOAL #2   Title Improve lumbar flexion by 10 degrees to demonstrate improved mobility of lumbar spine.    Time 3   Period Weeks   Status Achieved   PT SHORT TERM GOAL #3   Title Increase right hip strength by 1/2 grade to improve functional mobility and gait mechanics.    Time 3   Period Weeks   Status Achieved           PT Long Term Goals - 05/07/15 1329    PT LONG TERM GOAL #1   Title Pt will be independent with advanced HEP for core stabilization.    Time 6   Period Weeks   Status On-going   PT LONG TERM GOAL #2   Title Improve RLE strength to 4+/5 or greater to improve ability to ambulate and climb stairs without pain or difficulty.    Time 6   Period Weeks   Status Partially Met   PT LONG TERM GOAL #3   Title Improve lumbar flexion ROM to 80 degrees or greater to  demonstrate improved mobility of lumbar spine.    Time 6   Period Weeks   Status Not Met  PT LONG TERM GOAL #4   Title Improve R hip IR and ER ROM by 7 degrees or greater to improve functional mobility.   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    Baseline 05/28/23- able to lift but requires cues for safe twisting/pivoting with box    Status Partially Met               Plan - 05-28-15 1714    Clinical Impression Statement Discharge assessment performed today. Patient did well on re-assessment, however per FOTO demonstrated no significant changes sicne last assessment; patient reports that he feels confident and fells that he is doing very well overall. At this time patient is appropriate for DC to advanced HEP, which was assigned today.  Patient did emonstrate some difficulty with safe mechanics when turning with a load, instead twisting, and was corrected in order to maintain safety of low back and reduce risk of re-injuruy.    Pt will benefit from skilled therapeutic intervention in order to improve on the following deficits Abnormal gait;Decreased activity tolerance;Decreased endurance;Decreased mobility;Decreased range of motion;Decreased strength;Difficulty walking;Postural dysfunction   Rehab Potential Good   PT Treatment/Interventions ADLs/Self Care Home Management;Moist Heat;Traction;Gait training;Stair training;Functional mobility training;Therapeutic activities;Therapeutic exercise;Balance training;Neuromuscular re-education;Patient/family education;Manual techniques   PT Next Visit Plan DC today    PT Home Exercise Plan Optional addition of seated marching.    Consulted and Agree with Plan of Care Patient          G-Codes - May 28, 2015 1716    Functional Assessment Tool Used  FOTO 23% limited, also based on skilled clinical assessment of strength, power, ROM, functional status    Functional Limitation Mobility: Walking and moving around   Mobility: Walking and Moving Around Goal Status [phone]) At least 20 percent but less than 40 percent impaired, limited or restricted   Mobility: Walking and Moving Around Discharge Status [phone]) At least 1 percent but less than 20 percent impaired, limited or restricted      Problem List Patient Active Problem List   Diagnosis Date Noted  . Hyperlipidemia   . Cerebrovascular disease   . Thrombocytopenia ([place])   . Erectile dysfunction   . Migraine headache   . Upper gastrointestinal hemorrhage   . Arteriosclerotic cardiovascular disease (ASCVD)   . Hypertension 01/16/2009    PHYSICAL THERAPY DISCHARGE SUMMARY  Visits from Start of Care: 12  Current functional level related to goals / functional outcomes: Patient is feeling very good with both his back and knee, does report some mild back pain however he expects that this will get better with time. No major concerns with DC today. Remains very motivated to keep up with advanced HEP and activity overall.    Remaining deficits: Back pain, mild muscle weakness   Education / Equipment: Advanced HEP  Plan: Patient agrees to discharge.  Patient goals were partially met. Patient is being discharged due to being pleased with the current functional level.  ?????       Deniece Ree PT, DPT [facility] [address] [place], [place], [address] Phone: [phone]   Fax:  [phone]  Name: ORLEN LEEDY MRN: [id number] Date of Birth: [birth year]

## 2015-05-13 ENCOUNTER — Encounter (HOSPITAL_COMMUNITY): Payer: Medicare Other | Admitting: Physical Therapy

## 2015-05-15 ENCOUNTER — Encounter (HOSPITAL_COMMUNITY): Payer: Medicare Other | Admitting: Physical Therapy

## 2015-05-20 ENCOUNTER — Encounter (HOSPITAL_COMMUNITY): Payer: Medicare Other | Admitting: Physical Therapy

## 2015-05-22 ENCOUNTER — Encounter (HOSPITAL_COMMUNITY): Payer: Medicare Other

## 2015-05-27 ENCOUNTER — Encounter (HOSPITAL_COMMUNITY): Payer: Medicare Other | Admitting: Physical Therapy

## 2015-05-29 ENCOUNTER — Encounter (HOSPITAL_COMMUNITY): Payer: Medicare Other

## 2015-06-03 ENCOUNTER — Encounter (HOSPITAL_COMMUNITY): Payer: Medicare Other | Admitting: Physical Therapy

## 2015-10-03 ENCOUNTER — Encounter: Payer: Self-pay | Admitting: Cardiovascular Disease

## 2015-11-28 ENCOUNTER — Ambulatory Visit: Payer: Medicare Other | Admitting: Cardiovascular Disease

## 2015-12-16 ENCOUNTER — Encounter: Payer: Self-pay | Admitting: Cardiovascular Disease

## 2015-12-16 ENCOUNTER — Ambulatory Visit (INDEPENDENT_AMBULATORY_CARE_PROVIDER_SITE_OTHER): Payer: Medicare Other | Admitting: Cardiovascular Disease

## 2015-12-16 VITALS — BP 116/62 | HR 51 | Ht 72.0 in | Wt 192.0 lb

## 2015-12-16 DIAGNOSIS — E785 Hyperlipidemia, unspecified: Secondary | ICD-10-CM

## 2015-12-16 DIAGNOSIS — I6523 Occlusion and stenosis of bilateral carotid arteries: Secondary | ICD-10-CM | POA: Diagnosis not present

## 2015-12-16 DIAGNOSIS — I25708 Atherosclerosis of coronary artery bypass graft(s), unspecified, with other forms of angina pectoris: Secondary | ICD-10-CM

## 2015-12-16 DIAGNOSIS — I10 Essential (primary) hypertension: Secondary | ICD-10-CM | POA: Diagnosis not present

## 2015-12-16 DIAGNOSIS — R0602 Shortness of breath: Secondary | ICD-10-CM | POA: Diagnosis not present

## 2015-12-16 NOTE — Patient Instructions (Signed)

## 2015-12-16 NOTE — Addendum Note (Signed)
Addended byDebbora Lacrosse R on: 12/16/2015 01:21 PM   Modules accepted: Orders, Medications

## 2015-12-16 NOTE — Progress Notes (Signed)
Patient ID: Jorge Bishop, male   DOB: 1935/07/29, 80 y.o.   MRN: [id number]      SUBJECTIVE: The patient presents for routine follow up. He has a history of CABG in 1998 and subsequent percutaneous coronary intervention. He also has HTN, bilateral carotid artery stenosis, and hyperlipidemia.  Normal nuclear stress test 12/05/14.  Echocardiogram performed at an outside facility on 12/19/13 reportedly demonstrated normal left ventricular systolic function, EF 123456, with mild aortic regurgitation.  He has been doing very well and denies chest pain, palpitations, lightheadedness, dizziness, and leg swelling. He has shortness of breath when pushing his lawnmower and climbing stairs which is unchanged from last year.  He can walk 3 Bishop daily without experiencing any symptoms.   He now obtains noninvasive imaging at the [facility] including carotid Dopplers.  He volunteers in cardiac rehabilitation.   Review of Systems: As per "subjective", otherwise negative.  Allergies  Allergen Reactions  . Bactrim   . Clarithromycin     REACTION: UNKNOWN REACTION  . Codeine     REACTION: UNKNOWN REACTION  . Erythromycin     Current Outpatient Prescriptions  Medication Sig Dispense Refill  . aspirin (ASPIRIN LOW DOSE) 81 MG tablet Take 81 mg by mouth daily.      Marland Kitchen atorvastatin (LIPITOR) 20 MG tablet Take 20 mg by mouth at bedtime.      Marland Kitchen ezetimibe (ZETIA) 10 MG tablet Take 10 mg by mouth daily.    . Fiber, Guar Gum, CHEW Chew 4 g by mouth daily.    Marland Kitchen labetalol (NORMODYNE) 200 MG tablet Take 200 mg by mouth 2 (two) times daily.      Marland Kitchen loperamide (IMODIUM A-D) 2 MG tablet Take 0.5 tablets (1 mg total) by mouth at bedtime. 1 tablet 0  . niacin (NIASPAN) 1000 MG CR tablet Take 1,500 mg by mouth at bedtime.     Marland Kitchen omeprazole (PRILOSEC) 20 MG capsule Take 20 mg by mouth 2 (two) times daily before a meal.     . OVER THE COUNTER MEDICATION Place 1 drop into both eyes daily. Restore eye drops.    . polycarbophil  (FIBERCON) 625 MG tablet Take by mouth daily. 4 gms daily    . Tamsulosin HCl (FLOMAX) 0.4 MG CAPS Take 0.8 mg by mouth daily. 1 tablet in the am and 1 in the pm     No current facility-administered medications for this visit.    Past Medical History  Diagnosis Date  . Hyperlipidemia     Lipid profile in 12/2009:104, 91, 36, 50  . Cerebrovascular disease     60% bilateral internal carotid artery stenosis in 2012  . Thrombocytopenia (HCC)     platelets of 104 in 03/2009  . Erectile dysfunction   . Migraine headache   . Benign prostatic hypertrophy   . Diverticulosis   . DJD (degenerative joint disease)   . Upper gastrointestinal hemorrhage 123456     Helicobacter pylori positive; subsequent laparoscopic fundoplication-2009  . Arteriosclerotic cardiovascular disease (ASCVD)     Coronary artery bypass graft surgery in 12/98; DES to the RCA SVG in 07/2002; restenosis of the saphenous vein graft stent and renal intervention in 2006; 70% first diagonal present; calf in 11/2007-total obstruction of the saphenous vein graft to the first marginal  . Right bundle branch block   . Shortness of breath   . GERD (gastroesophageal reflux disease)     Past Surgical History  Procedure Laterality Date  . Coronary artery bypass graft  1998   . Laparoscopic nissen fundoplication  0000000, [id number]     revision in 2009 along with repair of paraesophagel hernia   . Cardiac catheterization      4 cardiac stents  . Paraesophageal hernia repair    . Hernia repair      right inguinal  . Cataract extraction w/phaco Right 07/25/2012    Procedure: CATARACT EXTRACTION PHACO AND INTRAOCULAR LENS PLACEMENT (IOC);  Surgeon: Tonny Branch, MD;  Location: [facility];  Service: Ophthalmology;  Laterality: Right;  CDE:23.31  . Cataract extraction w/phaco Left 08/18/2012    Procedure: CATARACT EXTRACTION PHACO AND INTRAOCULAR LENS PLACEMENT (IOC);  Surgeon: Tonny Branch, MD;  Location: [facility];  Service: Ophthalmology;  Laterality:  Left;  CDE: 16.07  . Colonoscopy N/A 08/01/2014    Procedure: COLONOSCOPY;  Surgeon: Rogene Houston, MD;  Location: [facility];  Service: Endoscopy;  Laterality: N/A;  125 - moved to 10:30 - Ann to notify pt    Social History   Social History  . Marital Status: Married    Spouse Name: N/A  . Number of Children: N/A  . Years of Education: N/A   Occupational History  . Full time    Social History Main Topics  . Smoking status: Never Smoker   . Smokeless tobacco: Not on file  . Alcohol Use: No  . Drug Use: No  . Sexual Activity: Yes    Birth Control/ Protection: None   Other Topics Concern  . Not on file   Social History Narrative     Filed Vitals:   12/16/15 1302  BP: 116/62  Pulse: 51  Height: 6' (1.829 m)  Weight: 192 lb (87.091 kg)  SpO2: 96%    PHYSICAL EXAM General: NAD HEENT: Normal. Neck: No JVD, no thyromegaly. Lungs: Clear to auscultation bilaterally with normal respiratory effort. CV: Nondisplaced PMI.  Regular rate and rhythm, normal S1/S2, no S3/S4, no murmur. No pretibial or periankle edema.  No carotid bruit.   Abdomen: Soft, nontender, no distention.  Neurologic: Alert and oriented.  Psych: Normal affect. Skin: Normal. Musculoskeletal: No gross deformities.    ECG: Most recent ECG reviewed.      ASSESSMENT AND PLAN: 1. CAD/CABG with subsequent PCI: Normal nuclear stress test 12/05/14. Continue aspirin, Lipitor, Zetia, and labetalol. Echo results from 12/2013 noted above.  2. Essential HTN: Well-controlled on current medical therapy which includes labetalol. No changes.  3. Hyperlipidemia: Continue Lipitor and Zetia.  4. Bilateral carotid artery stenosis: Followed by PCP at [facility]. Continue ASA and statin.  Dispo: f/u 1 year.   Kate Sable, M.D., F.A.C.C.

## 2016-01-30 ENCOUNTER — Encounter (INDEPENDENT_AMBULATORY_CARE_PROVIDER_SITE_OTHER): Payer: Self-pay | Admitting: Internal Medicine

## 2016-02-03 IMAGING — NM NM MYOCAR MULTI W/SPECT W/WALL MOTION & EF
2 series · 12 of 12 positions shown · non-contrast
Comparison: none

[Series 1: rest · 8.28mm/px · 6 of 64 frames shown]
[frame 6/64]
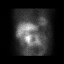
[frame 16/64]
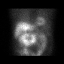
[frame 27/64]
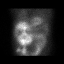
[frame 38/64]
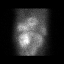
[frame 48/64]
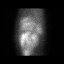
[frame 59/64]
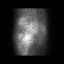

[Series 2: stress gated · 8.28mm/px · 6 of 64 frames shown]
[frame 6/64]
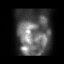
[frame 16/64]
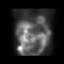
[frame 27/64]
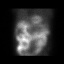
[frame 38/64]
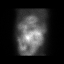
[frame 48/64]
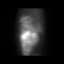
[frame 59/64]
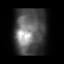

[12 of 12 positions shown; findings below may reference images not displayed]

Canned report from images found in remote index.

Refer to host system for actual result text.

## 2016-04-23 ENCOUNTER — Ambulatory Visit (INDEPENDENT_AMBULATORY_CARE_PROVIDER_SITE_OTHER): Payer: Medicare Other | Admitting: Internal Medicine

## 2016-08-21 ENCOUNTER — Telehealth: Payer: Self-pay | Admitting: Cardiovascular Disease

## 2016-08-21 NOTE — Telephone Encounter (Signed)
Will message his MD

## 2016-08-21 NOTE — Telephone Encounter (Signed)
Can try Coreg 6.25 mg bid to start.

## 2016-08-21 NOTE — Telephone Encounter (Signed)
Patient is wanting to know if his Labetalol can be switched to Atenolol or Carvedilol so he can get it from [place]. Please call patient to discuss side effects and if it can be changed. / tg

## 2016-08-24 MED ORDER — CARVEDILOL 6.25 MG PO TABS: 6.2500 mg | ORAL_TABLET | Freq: Two times a day (BID) | ORAL | 3 refills | Status: DC

## 2016-08-24 NOTE — Telephone Encounter (Signed)
Patient change from labetolol to Carvedilol 6.25 mg twice a day, pt to pick up Rx for the [place]

## 2016-12-30 ENCOUNTER — Ambulatory Visit: Payer: Medicare Other | Admitting: Cardiovascular Disease

## 2017-01-08 ENCOUNTER — Encounter: Payer: Self-pay | Admitting: *Deleted

## 2017-01-11 ENCOUNTER — Ambulatory Visit: Payer: Medicare Other | Admitting: Cardiovascular Disease

## 2017-01-20 ENCOUNTER — Encounter: Payer: Self-pay | Admitting: Cardiovascular Disease

## 2017-01-20 ENCOUNTER — Ambulatory Visit (INDEPENDENT_AMBULATORY_CARE_PROVIDER_SITE_OTHER): Payer: Medicare Other | Admitting: Cardiovascular Disease

## 2017-01-20 VITALS — BP 118/64 | HR 63 | Ht 72.0 in | Wt 189.0 lb

## 2017-01-20 DIAGNOSIS — I25708 Atherosclerosis of coronary artery bypass graft(s), unspecified, with other forms of angina pectoris: Secondary | ICD-10-CM

## 2017-01-20 DIAGNOSIS — E782 Mixed hyperlipidemia: Secondary | ICD-10-CM | POA: Diagnosis not present

## 2017-01-20 DIAGNOSIS — I6523 Occlusion and stenosis of bilateral carotid arteries: Secondary | ICD-10-CM | POA: Diagnosis not present

## 2017-01-20 DIAGNOSIS — I10 Essential (primary) hypertension: Secondary | ICD-10-CM | POA: Diagnosis not present

## 2017-01-20 DIAGNOSIS — I209 Angina pectoris, unspecified: Secondary | ICD-10-CM | POA: Diagnosis not present

## 2017-01-20 NOTE — Progress Notes (Signed)
SUBJECTIVE: The patient presents for routine follow up. He has a history of CABG in 1998 and subsequent percutaneous coronary intervention. He also has HTN, bilateral carotid artery stenosis, and hyperlipidemia.  Normal nuclear stress test was normal on 12/05/14.  Echocardiogram performed at an outside facility on 12/19/13 reportedly demonstrated normal left ventricular systolic function, EF 60%, with mild aortic regurgitation.  The patient denies any symptoms of chest pain, palpitations, lightheadedness, dizziness, leg swelling, orthopnea, PND, and syncope. He has shortness of breath when climbing stairs which is chronic and has not progressed in severity.  He is due to have carotid Dopplers at the [place] in September.  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    Review of Systems: As per "subjective", otherwise negative.  Allergies  Allergen Reactions  . Bactrim Other (See Comments)    Throat close  . Clarithromycin Anaphylaxis    REACTION: UNKNOWN REACTION  . Codeine Nausea And Vomiting    REACTION: UNKNOWN REACTION  . Erythromycin Nausea And Vomiting    Current Outpatient Prescriptions  Medication Sig Dispense Refill  . aspirin (ASPIRIN LOW DOSE) 81 MG tablet Take 81 mg by mouth daily.      Marland Kitchen atorvastatin (LIPITOR) 20 MG tablet Take 20 mg by mouth at bedtime.      . carvedilol (COREG) 6.25 MG tablet Take 1 tablet (6.25 mg total) by mouth 2 (two) times daily. 180 tablet 3  . ezetimibe (ZETIA) 10 MG tablet Take 10 mg by mouth daily.    Marland Kitchen loperamide (IMODIUM A-D) 2 MG tablet Take 0.5 tablets (1 mg total) by mouth at bedtime. (Patient taking differently: Take 1 mg by mouth as needed. ) 1 tablet 0  . niacin (NIASPAN) 500 MG CR tablet Take 500 mg by mouth 3 (three) times daily. 1 in the morning and 2 in the evening    . omeprazole (PRILOSEC) 20 MG capsule Take 20 mg by mouth as needed (acid reflux). prn    .  OVER THE COUNTER MEDICATION Place 1 drop into both eyes daily. Restore eye drops.    . polycarbophil (FIBERCON) 625 MG tablet Take by mouth as needed. 4 gms daily    . Tamsulosin HCl (FLOMAX) 0.4 MG CAPS Take 0.8 mg by mouth daily. 1 tablet in the am and 1 in the pm     No current facility-administered medications for this visit.     Past Medical History:  Diagnosis Date  . Arteriosclerotic cardiovascular disease (ASCVD)    Coronary artery bypass graft surgery in 12/98; DES to the RCA SVG in 07/2002; restenosis of the saphenous vein graft stent and renal intervention in 2006; 70% first diagonal present; calf in 11/2007-total obstruction of the saphenous vein graft to the first marginal  . Benign prostatic hypertrophy   . Cerebrovascular disease    60% bilateral internal carotid artery stenosis in 2012  . Diverticulosis   . DJD (degenerative joint disease)   . Erectile dysfunction   . GERD (gastroesophageal reflux disease)   . Hyperlipidemia    Lipid profile in 12/2009:104, 91, 36, 50  . Migraine headache   . Right bundle branch block   . Shortness of breath   . Thrombocytopenia (HCC)    platelets of 104 in 03/2009  . Upper gastrointestinal hemorrhage 45/40    Helicobacter pylori positive; subsequent laparoscopic fundoplication-2009    Past Surgical History:  Procedure Laterality Date  .  CARDIAC CATHETERIZATION     4 cardiac stents  . CATARACT EXTRACTION W/PHACO Right 07/25/2012   Procedure: CATARACT EXTRACTION PHACO AND INTRAOCULAR LENS PLACEMENT (IOC);  Surgeon: Tonny Branch, MD;  Location: [facility];  Service: Ophthalmology;  Laterality: Right;  CDE:23.31  . CATARACT EXTRACTION W/PHACO Left 08/18/2012   Procedure: CATARACT EXTRACTION PHACO AND INTRAOCULAR LENS PLACEMENT (IOC);  Surgeon: Tonny Branch, MD;  Location: [facility];  Service: Ophthalmology;  Laterality: Left;  CDE: 16.07  . COLONOSCOPY N/A 08/01/2014   Procedure: COLONOSCOPY;  Surgeon: Rogene Houston, MD;  Location[facility];   Service: Endoscopy;  Laterality: N/A;  125 - moved to 10:30 - Ann to notify pt  . CORONARY ARTERY BYPASS GRAFT  1998   . HERNIA REPAIR     right inguinal  . LAPAROSCOPIC NISSEN FUNDOPLICATION  9622, 2979    revision in 2009 along with repair of paraesophagel hernia   . PARAESOPHAGEAL HERNIA REPAIR      Social History   Social History  . Marital status: Married    Spouse name: N/A  . Number of children: N/A  . Years of education: N/A   Occupational History  . Full time Retired   Social History Main Topics  . Smoking status: Never Smoker  . Smokeless tobacco: Never Used  . Alcohol use No  . Drug use: No  . Sexual activity: Yes    Birth control/ protection: None   Other Topics Concern  . Not on file   Social History Narrative  . No narrative on file     Vitals:   01/20/17 1314  BP: 118/64  Pulse: 63  SpO2: 96%  Weight: 189 lb (85.7 kg)  Height: 6' (1.829 m)    Wt Readings from Last 3 Encounters:  01/20/17 189 lb (85.7 kg)  12/16/15 192 lb (87.1 kg)  04/24/15 189 lb 8 oz (86 kg)     PHYSICAL EXAM General: NAD HEENT: Normal. Neck: No JVD, no thyromegaly. Lungs: Clear to auscultation bilaterally with normal respiratory effort. CV: Nondisplaced PMI.  Regular rate and rhythm, normal S1/S2, no S3/S4, no murmur. No pretibial or periankle edema.  No carotid bruit.   Abdomen: Soft, nontender, no distention.  Neurologic: Alert and oriented.  Psych: Normal affect. Skin: Normal. Musculoskeletal: No gross deformities.    ECG: Most recent ECG reviewed.   Labs: Lab Results  Component Value Date/Time   K 4.2 07/21/2012 01:00 PM   BUN 13 07/21/2012 01:00 PM   CREATININE 0.91 07/21/2012 01:00 PM   ALT 30 01/06/2010   TSH 2.410 04/09/2009   HGB 15.2 07/21/2012 01:00 PM     Lipids: Lab Results  Component Value Date/Time   LDLCALC 50 01/06/2010   CHOL 104 01/06/2010   TRIG 91 01/06/2010   HDL 36 01/06/2010       ASSESSMENT AND PLAN:  1. CAD/CABG  with subsequent PCI: Stable ischemic heart disease. Normal nuclear stress test 12/05/14. Continue aspirin, Lipitor, Zetia, and carvedilol. Echo results from 12/2013 noted above.  2. Essential HTN: Well-controlled on current medical therapy which includes carvedilol. No changes.  3. Hyperlipidemia: Continue Lipitor and Zetia.  4. Bilateral carotid artery stenosis: Followed by PCP at [facility]. He is due to have carotid Dopplers at the [place] in September. Continue ASA and statin.     Disposition: Follow up 1 yr   Kate Sable, M.D., F.A.C.C.

## 2017-01-20 NOTE — Patient Instructions (Signed)

## 2017-09-08 ENCOUNTER — Ambulatory Visit (HOSPITAL_COMMUNITY)
Admission: RE | Admit: 2017-09-08 | Discharge: 2017-09-08 | Disposition: A | Discharge status: Home / Self Care | Payer: Medicare Other | Source: Ambulatory Visit | Attending: Family Medicine | Admitting: Family Medicine

## 2017-09-08 ENCOUNTER — Encounter (HOSPITAL_COMMUNITY): Payer: Self-pay | Admitting: *Deleted

## 2017-09-08 ENCOUNTER — Inpatient Hospital Stay (HOSPITAL_COMMUNITY)
Admission: AD | Admit: 2017-09-08 | Discharge: 2017-09-11 | DRG: 693 | Disposition: A | Discharge status: Home / Self Care | Payer: Medicare Other | Source: Ambulatory Visit | Attending: Family Medicine | Admitting: Family Medicine

## 2017-09-08 ENCOUNTER — Other Ambulatory Visit (HOSPITAL_COMMUNITY): Payer: Self-pay | Admitting: Family Medicine

## 2017-09-08 ENCOUNTER — Other Ambulatory Visit: Payer: Self-pay

## 2017-09-08 DIAGNOSIS — I6523 Occlusion and stenosis of bilateral carotid arteries: Secondary | ICD-10-CM | POA: Diagnosis present

## 2017-09-08 DIAGNOSIS — N400 Enlarged prostate without lower urinary tract symptoms: Secondary | ICD-10-CM | POA: Diagnosis present

## 2017-09-08 DIAGNOSIS — Z951 Presence of aortocoronary bypass graft: Secondary | ICD-10-CM | POA: Diagnosis not present

## 2017-09-08 DIAGNOSIS — Z885 Allergy status to narcotic agent status: Secondary | ICD-10-CM

## 2017-09-08 DIAGNOSIS — R1011 Right upper quadrant pain: Secondary | ICD-10-CM

## 2017-09-08 DIAGNOSIS — K219 Gastro-esophageal reflux disease without esophagitis: Secondary | ICD-10-CM | POA: Diagnosis present

## 2017-09-08 DIAGNOSIS — N200 Calculus of kidney: Secondary | ICD-10-CM

## 2017-09-08 DIAGNOSIS — N202 Calculus of kidney with calculus of ureter: Principal | ICD-10-CM | POA: Diagnosis present

## 2017-09-08 DIAGNOSIS — R109 Unspecified abdominal pain: Secondary | ICD-10-CM | POA: Diagnosis present

## 2017-09-08 DIAGNOSIS — Z881 Allergy status to other antibiotic agents status: Secondary | ICD-10-CM | POA: Diagnosis not present

## 2017-09-08 DIAGNOSIS — I679 Cerebrovascular disease, unspecified: Secondary | ICD-10-CM

## 2017-09-08 DIAGNOSIS — E785 Hyperlipidemia, unspecified: Secondary | ICD-10-CM | POA: Diagnosis present

## 2017-09-08 DIAGNOSIS — I429 Cardiomyopathy, unspecified: Secondary | ICD-10-CM | POA: Diagnosis not present

## 2017-09-08 DIAGNOSIS — G43001 Migraine without aura, not intractable, with status migrainosus: Secondary | ICD-10-CM

## 2017-09-08 DIAGNOSIS — R1031 Right lower quadrant pain: Secondary | ICD-10-CM | POA: Diagnosis not present

## 2017-09-08 DIAGNOSIS — N201 Calculus of ureter: Secondary | ICD-10-CM | POA: Insufficient documentation

## 2017-09-08 DIAGNOSIS — Z955 Presence of coronary angioplasty implant and graft: Secondary | ICD-10-CM | POA: Diagnosis not present

## 2017-09-08 DIAGNOSIS — D696 Thrombocytopenia, unspecified: Secondary | ICD-10-CM

## 2017-09-08 DIAGNOSIS — E43 Unspecified severe protein-calorie malnutrition: Secondary | ICD-10-CM

## 2017-09-08 DIAGNOSIS — Z6823 Body mass index (BMI) 23.0-23.9, adult: Secondary | ICD-10-CM | POA: Diagnosis not present

## 2017-09-08 DIAGNOSIS — I2510 Atherosclerotic heart disease of native coronary artery without angina pectoris: Secondary | ICD-10-CM

## 2017-09-08 DIAGNOSIS — Z882 Allergy status to sulfonamides status: Secondary | ICD-10-CM

## 2017-09-08 DIAGNOSIS — Z7982 Long term (current) use of aspirin: Secondary | ICD-10-CM | POA: Diagnosis not present

## 2017-09-08 DIAGNOSIS — K5730 Diverticulosis of large intestine without perforation or abscess without bleeding: Secondary | ICD-10-CM | POA: Insufficient documentation

## 2017-09-08 DIAGNOSIS — I10 Essential (primary) hypertension: Secondary | ICD-10-CM

## 2017-09-08 DIAGNOSIS — Z79899 Other long term (current) drug therapy: Secondary | ICD-10-CM | POA: Diagnosis not present

## 2017-09-08 DIAGNOSIS — R1084 Generalized abdominal pain: Secondary | ICD-10-CM

## 2017-09-08 DIAGNOSIS — N5201 Erectile dysfunction due to arterial insufficiency: Secondary | ICD-10-CM

## 2017-09-08 DIAGNOSIS — E7800 Pure hypercholesterolemia, unspecified: Secondary | ICD-10-CM

## 2017-09-08 HISTORY — DX: Calculus of kidney: N20.0

## 2017-09-08 LAB — CBC WITH DIFFERENTIAL/PLATELET
BASOS ABS: 0 10*3/uL (ref 0.0–0.1)
Basophils Relative: 0 %
Eosinophils Absolute: 0.1 10*3/uL (ref 0.0–0.7)
Eosinophils Relative: 1 %
HEMATOCRIT: 48.4 % (ref 39.0–52.0)
HEMOGLOBIN: 15.6 g/dL (ref 13.0–17.0)
LYMPHS PCT: 9 %
Lymphs Abs: 0.9 10*3/uL (ref 0.7–4.0)
MCH: 29.8 pg (ref 26.0–34.0)
MCHC: 32.2 g/dL (ref 30.0–36.0)
MCV: 92.5 fL (ref 78.0–100.0)
MONO ABS: 1.4 10*3/uL — AB (ref 0.1–1.0)
Monocytes Relative: 14 %
NEUTROS ABS: 7.8 10*3/uL — AB (ref 1.7–7.7)
Neutrophils Relative %: 76 %
PLATELETS: 76 10*3/uL — AB (ref 150–400)
RBC: 5.23 MIL/uL (ref 4.22–5.81)
RDW: 13.4 % (ref 11.5–15.5)
WBC: 10.1 10*3/uL (ref 4.0–10.5)

## 2017-09-08 LAB — COMPREHENSIVE METABOLIC PANEL
ALBUMIN: 3.9 g/dL (ref 3.5–5.0)
ALT: 36 U/L (ref 17–63)
ANION GAP: 13 (ref 5–15)
AST: 29 U/L (ref 15–41)
Alkaline Phosphatase: 65 U/L (ref 38–126)
BILIRUBIN TOTAL: 2.4 mg/dL — AB (ref 0.3–1.2)
BUN: 25 mg/dL — ABNORMAL HIGH (ref 6–20)
CO2: 22 mmol/L (ref 22–32)
Calcium: 9.3 mg/dL (ref 8.9–10.3)
Chloride: 102 mmol/L (ref 101–111)
Creatinine, Ser: 1.43 mg/dL — ABNORMAL HIGH (ref 0.61–1.24)
GFR calc Af Amer: 52 mL/min — ABNORMAL LOW (ref >60)
GFR calc non Af Amer: 45 mL/min — ABNORMAL LOW (ref >60)
GLUCOSE: 109 mg/dL — AB (ref 65–99)
POTASSIUM: 4.7 mmol/L (ref 3.5–5.1)
SODIUM: 137 mmol/L (ref 135–145)
TOTAL PROTEIN: 7.5 g/dL (ref 6.5–8.1)

## 2017-09-08 MED ORDER — HYDROMORPHONE HCL 1 MG/ML IJ SOLN
0.5000 mg | INTRAMUSCULAR | Status: DC | PRN
  Administered 2017-09-08 – 2017-09-09 (×3): 0.5 mg via INTRAVENOUS
  Filled 2017-09-08 (×3): qty 0.5

## 2017-09-08 MED ORDER — LOPERAMIDE HCL 1 MG/5ML PO LIQD
1.0000 mg | ORAL | Status: DC | PRN
  Filled 2017-09-08: qty 5

## 2017-09-08 MED ORDER — PROMETHAZINE HCL 25 MG/ML IJ SOLN
12.5000 mg | Freq: Three times a day (TID) | INTRAMUSCULAR | Status: DC | PRN
Start: 2017-09-08 — End: 2017-09-11
  Administered 2017-09-09 (×2): 12.5 mg via INTRAVENOUS
  Filled 2017-09-08 (×2): qty 1

## 2017-09-08 MED ORDER — IOPAMIDOL (ISOVUE-300) INJECTION 61%
80.0000 mL | Freq: Once | INTRAVENOUS | Status: AC | PRN
  Administered 2017-09-08: 80 mL via INTRAVENOUS

## 2017-09-08 MED ORDER — EZETIMIBE 10 MG PO TABS
10.0000 mg | ORAL_TABLET | Freq: Every day | ORAL | Status: DC
  Administered 2017-09-08 – 2017-09-11 (×4): 10 mg via ORAL
  Filled 2017-09-08 (×4): qty 1

## 2017-09-08 MED ORDER — ASPIRIN 81 MG PO CHEW
81.0000 mg | CHEWABLE_TABLET | Freq: Every day | ORAL | Status: DC
Start: 2017-09-08 — End: 2017-09-10
  Administered 2017-09-08 – 2017-09-10 (×3): 81 mg via ORAL
  Filled 2017-09-08 (×3): qty 1

## 2017-09-08 MED ORDER — ACETAMINOPHEN 650 MG RE SUPP: 650.0000 mg | Freq: Four times a day (QID) | RECTAL | Status: DC | PRN

## 2017-09-08 MED ORDER — ONDANSETRON HCL 4 MG/2ML IJ SOLN
4.0000 mg | Freq: Four times a day (QID) | INTRAMUSCULAR | Status: DC | PRN
  Administered 2017-09-08: 4 mg via INTRAVENOUS
  Filled 2017-09-08: qty 2

## 2017-09-08 MED ORDER — ONDANSETRON HCL 4 MG PO TABS: 4.0000 mg | ORAL_TABLET | Freq: Four times a day (QID) | ORAL | Status: DC | PRN

## 2017-09-08 MED ORDER — ACETAMINOPHEN 325 MG PO TABS: 650.0000 mg | ORAL_TABLET | Freq: Four times a day (QID) | ORAL | Status: DC | PRN

## 2017-09-08 MED ORDER — SODIUM CHLORIDE 0.9 % IV SOLN
INTRAVENOUS | Status: DC
  Administered 2017-09-08 – 2017-09-10 (×3): via INTRAVENOUS

## 2017-09-08 MED ORDER — ENOXAPARIN SODIUM 40 MG/0.4ML ~~LOC~~ SOLN
40.0000 mg | SUBCUTANEOUS | Status: DC
  Administered 2017-09-08 – 2017-09-09 (×2): 40 mg via SUBCUTANEOUS
  Filled 2017-09-08 (×2): qty 0.4

## 2017-09-08 MED ORDER — CARVEDILOL 3.125 MG PO TABS
6.2500 mg | ORAL_TABLET | Freq: Two times a day (BID) | ORAL | Status: DC
  Administered 2017-09-09 – 2017-09-11 (×6): 6.25 mg via ORAL
  Filled 2017-09-08 (×6): qty 2

## 2017-09-08 MED ORDER — NIACIN ER (ANTIHYPERLIPIDEMIC) 500 MG PO TBCR
500.0000 mg | EXTENDED_RELEASE_TABLET | Freq: Three times a day (TID) | ORAL | Status: DC
  Administered 2017-09-09 – 2017-09-11 (×4): 500 mg via ORAL
  Filled 2017-09-08 (×14): qty 1

## 2017-09-08 MED ORDER — ENSURE ENLIVE PO LIQD
237.0000 mL | Freq: Two times a day (BID) | ORAL | Status: DC
  Administered 2017-09-09: 237 mL via ORAL

## 2017-09-08 MED ORDER — TAMSULOSIN HCL 0.4 MG PO CAPS
0.8000 mg | ORAL_CAPSULE | Freq: Two times a day (BID) | ORAL | Status: DC
  Administered 2017-09-09 – 2017-09-11 (×6): 0.8 mg via ORAL
  Filled 2017-09-08 (×6): qty 2

## 2017-09-08 MED ORDER — ATORVASTATIN CALCIUM 20 MG PO TABS
20.0000 mg | ORAL_TABLET | Freq: Every day | ORAL | Status: DC
  Administered 2017-09-09 – 2017-09-10 (×3): 20 mg via ORAL
  Filled 2017-09-08 (×3): qty 1

## 2017-09-08 NOTE — H&P (Signed)
Full note to follow. He was admitted with severe abdominal pain and very large kidney stone on CT.pain not able to be controlled outpatient.

## 2017-09-09 NOTE — Progress Notes (Signed)
Initial Nutrition Assessment  DOCUMENTATION CODES:   Severe malnutrition in context of acute illness/injury  INTERVENTION:   - Magic cup TID with meals, each supplement provides 290 kcal and 9 grams of protein  - Encourage PO intake  NUTRITION DIAGNOSIS:   Severe Malnutrition related to acute illness(kidney stones) as evidenced by moderate fat depletion, moderate muscle depletion, energy intake < or equal to 50% for > or equal to 5 days.  GOAL:   Patient will meet greater than or equal to 90% of their needs  MONITOR:   PO intake, Supplement acceptance, Weight trends  REASON FOR ASSESSMENT:   Malnutrition Screening Tool    ASSESSMENT:   82 year old male who was admitted with severe abdominal pain and very large kidney stone on CT. Pt with PMH significant for CAD s/p CABG, GERD, and HLD.  Spoke with pt at bedside who reports his UBW as 185 lbs and that he last weighed this 3 weeks ago. Pt states he has lost weight because he has no appetite. Per weight history, pt has experienced a 6.9% weight loss over the past 6 months which is not significant for timeframe.  Pt and pt's wife report that over the past 3 weeks, pt has only been able to eat a piece of toast and broth daily. Pt was able to eat part of a [place] burger and fruit cocktail at lunch today.  Pt would not like to receive Ensure Enlive but would like to try [employer]. Pt also provided RD with dislikes. RD entered these into [employer] Touch software.  Medications reviewed. Pt reports taking 2 fiber tablets and vitamin D3 at home.  Labs reviewed: BUN 25, creatinine 1.43  NUTRITION - FOCUSED PHYSICAL EXAM:    Most Recent Value  Orbital Region  Moderate depletion  Upper Arm Region  Mild depletion  Thoracic and Lumbar Region  Moderate depletion  Buccal Region  Mild depletion  Temple Region  Mild depletion  Clavicle Bone Region  Moderate depletion  Clavicle and Acromion Bone Region  Moderate depletion  Scapular Bone  Region  Moderate depletion  Dorsal Hand  No depletion  Patellar Region  Moderate depletion  Anterior Thigh Region  Moderate depletion  Posterior Calf Region  Moderate depletion  Edema (RD Assessment)  None  Hair  Reviewed  Eyes  Reviewed  Mouth  Reviewed  Skin  Reviewed  Nails  Reviewed       Diet Order:  Diet Heart Room service appropriate? Yes; Fluid consistency: Thin  EDUCATION NEEDS:   No education needs have been identified at this time  Skin:  Skin Assessment: Reviewed RN Assessment  Last BM:  09/03/17  Height:   Ht Readings from Last 1 Encounters:  09/08/17 6' (1.829 m)    Weight:   Wt Readings from Last 1 Encounters:  09/09/17 175 lb 15.5 oz (79.8 kg)    Ideal Body Weight:  80.9 kg  BMI:  Body mass index is 23.87 kg/m.  Estimated Nutritional Needs:   Kcal:  2000-2200 kcal/day  Protein:  95-110 grams/day  Fluid:  >/= 2.0 L/day    Gaynell Face, MS, RD, LDN Pager: [phone] Weekend/After Hours: [phone]

## 2017-09-09 NOTE — H&P (Signed)
Jorge Bishop MRN: [id number] DOB/AGE: November 29, 1935 82 y.o. Primary Care Physician:Dondiego, Richard, MD Admit date: 09/08/2017 Chief Complaint: Abdominal pain HPI: This is completion of the history and physical that I did in my office on the day of admission 09/08/2017.  This is an 82 year old patient of Dr. Denita Lung for whom I am covering for his off day who had CT of the abdomen and pelvis because of abdominal discomfort.  There was concern that he had cholecystitis but it did not appear that his gallbladder was thickened.  He did have an 11 mm calculus in the proximal right ureter causing moderate obstruction.  I called him with the results of his CT and he said he was having unbearable pain and requested admission to the hospital.  I had him come to my office and admitted him from there.  He has other medical problems including coronary disease status post bypass grafting, BPH, degenerative joint disease, GERD, hyperlipidemia.  He denies any chest pain nausea vomiting diarrhea headache shortness of breath hemoptysis change in vision change in bowel habits.  Past Medical History:  Diagnosis Date  . Arteriosclerotic cardiovascular disease (ASCVD)    Coronary artery bypass graft surgery in 12/98; DES to the RCA SVG in 07/2002; restenosis of the saphenous vein graft stent and renal intervention in 2006; 70% first diagonal present; calf in 11/2007-total obstruction of the saphenous vein graft to the first marginal  . Benign prostatic hypertrophy   . Cerebrovascular disease    60% bilateral internal carotid artery stenosis in 2012  . Diverticulosis   . DJD (degenerative joint disease)   . Erectile dysfunction   . GERD (gastroesophageal reflux disease)   . Hyperlipidemia    Lipid profile in 12/2009:104, 91, 36, 50  . Migraine headache   . Right bundle branch block   . Shortness of breath   . Thrombocytopenia (HCC)    platelets of 104 in 03/2009  . Upper gastrointestinal hemorrhage 89/16     Helicobacter pylori positive; subsequent laparoscopic fundoplication-2009   Past Surgical History:  Procedure Laterality Date  . CARDIAC CATHETERIZATION     4 cardiac stents  . CATARACT EXTRACTION W/PHACO Right 07/25/2012   Procedure: CATARACT EXTRACTION PHACO AND INTRAOCULAR LENS PLACEMENT (IOC);  Surgeon: Tonny Branch, MD;  Location: [facility];  Service: Ophthalmology;  Laterality: Right;  CDE:23.31  . CATARACT EXTRACTION W/PHACO Left 08/18/2012   Procedure: CATARACT EXTRACTION PHACO AND INTRAOCULAR LENS PLACEMENT (IOC);  Surgeon: Tonny Branch, MD;  Location: [facility];  Service: Ophthalmology;  Laterality: Left;  CDE: 16.07  . COLONOSCOPY N/A 08/01/2014   Procedure: COLONOSCOPY;  Surgeon: Rogene Houston, MD;  Location: [facility];  Service: Endoscopy;  Laterality: N/A;  125 - moved to 10:30 - Ann to notify pt  . CORONARY ARTERY BYPASS GRAFT  1998   . HERNIA REPAIR     right inguinal  . LAPAROSCOPIC NISSEN FUNDOPLICATION  9450, 3888    revision in 2009 along with repair of paraesophagel hernia   . PARAESOPHAGEAL HERNIA REPAIR          History reviewed. No pertinent family history. He is not sure if anyone in his family has had kidney stones.  Positive for cardiac disease Social History:  reports that he has never smoked. He has never used smokeless tobacco. He reports that he does not drink alcohol or use drugs.   Allergies:  Allergies  Allergen Reactions  . Bactrim Other (See Comments)    Throat close  . Clarithromycin Anaphylaxis  REACTION: UNKNOWN REACTION  . Codeine Nausea And Vomiting    REACTION: UNKNOWN REACTION  . Erythromycin Nausea And Vomiting    Medications Prior to Admission  Medication Sig Dispense Refill  . ondansetron (ZOFRAN) 4 MG tablet Take 4 mg by mouth 3 (three) times daily.    Marland Kitchen aspirin (ASPIRIN LOW DOSE) 81 MG tablet Take 81 mg by mouth daily.      Marland Kitchen atorvastatin (LIPITOR) 20 MG tablet Take 20 mg by mouth at bedtime.      . carvedilol (COREG) 6.25 MG  tablet Take 1 tablet (6.25 mg total) by mouth 2 (two) times daily. 180 tablet 3  . ezetimibe (ZETIA) 10 MG tablet Take 10 mg by mouth daily.    Marland Kitchen loperamide (IMODIUM A-D) 2 MG tablet Take 0.5 tablets (1 mg total) by mouth at bedtime. (Patient taking differently: Take 1 mg by mouth as needed. ) 1 tablet 0  . niacin (NIASPAN) 500 MG CR tablet Take 500 mg by mouth 3 (three) times daily. 1 in the morning and 2 in the evening    . omeprazole (PRILOSEC) 20 MG capsule Take 20 mg by mouth as needed (acid reflux). prn    . polycarbophil (FIBERCON) 625 MG tablet Take by mouth as needed. 4 gms daily    . Tamsulosin HCl (FLOMAX) 0.4 MG CAPS Take 0.8 mg by mouth daily. 1 tablet in the am and 1 in the pm         JJO:ACZYS from the symptoms mentioned above,there are no other symptoms referable to all systems reviewed.  10 point review of systems otherwise negative  Physical Exam: Blood pressure (!) 148/74, pulse 65, temperature 98.4 F (36.9 C), temperature source Oral, resp. rate 16, height 6' (1.829 m), weight 79.8 kg (175 lb 15.5 oz), SpO2 100 %. Constitutional: He is thin and appears to be in distress from pain.  Eyes: Pupils react EOMI.  Ears nose mouth and throat: His mucous membranes are moist and his hearing is grossly normal.  Throat is clear.  Cardiovascular: His heart is regular with normal heart sounds.  Respiratory: His respiratory effort is normal and his lungs are clear.  Gastrointestinal: He has minimal tenderness in the upper quadrant and some flank tenderness also.  Skin: Warm and dry.  Musculoskeletal: Normal strength.  Neurological: No focal abnormalities.  Psychiatric: Normal mood and affect   Recent Labs    09/08/17 1722  WBC 10.1  NEUTROABS 7.8*  HGB 15.6  HCT 48.4  MCV 92.5  PLT 76*   Recent Labs    09/08/17 1722  NA 137  K 4.7  CL 102  CO2 22  GLUCOSE 109*  BUN 25*  CREATININE 1.43*  CALCIUM 9.3  lablast2(ast:2,ALT:2,alkphos:2,bilitot:2,prot:2,albumin:2)@    No  results found for this or any previous visit (from the past 240 hour(s)).   Ct Abdomen Pelvis W Contrast  Result Date: 09/08/2017 CLINICAL DATA:  Generalized abdominal pain EXAM: CT ABDOMEN AND PELVIS WITH CONTRAST TECHNIQUE: Multidetector CT imaging of the abdomen and pelvis was performed using the standard protocol following bolus administration of intravenous contrast. CONTRAST:  22mL ISOVUE-300 IOPAMIDOL (ISOVUE-300) INJECTION 61% COMPARISON:  CT abdomen 03/22/2008 FINDINGS: Lower chest: Negative Hepatobiliary: Normal liver. Contracted gallbladder with calcified gallstones. Negative for gallbladder wall thickening. No biliary dilatation. Pancreas: Negative Spleen: Negative Adrenals/Urinary Tract: 11 mm calculus proximal right ureter at the UVJ causing moderate obstruction. 3 x 5 mm right upper pole calculus. No calculi on the left. Extrarenal pelvis bilaterally. Mild bladder  wall thickening due to chronic bladder outlet obstruction with large prostate. Stomach/Bowel: Moderately large hiatal hernia with evidence of prior hiatal hernia surgery. Negative for bowel obstruction. No bowel mass or edema. Sigmoid diverticulosis without acute edema. Normal appendix Vascular/Lymphatic: Atherosclerotic aorta without aneurysm. No lymphadenopathy. Reproductive: Prostate is significantly enlarged measuring 4.9 x 6.1 cm. Other: Negative for free fluid.  Negative for hernia. Musculoskeletal: Lumbar disc and facet degeneration. No acute skeletal abnormality. IMPRESSION: 11 mm calculus proximal right ureter causing moderate obstruction. 3 x 5 mm nonobstructing calculus right upper pole Sigmoid diverticulosis Prostate enlargement with bladder wall thickening. These results will be called to the ordering clinician or representative by the Radiologist Assistant, and communication documented in the PACS or zVision Dashboard. Electronically Signed   By: Franchot Gallo M.D.   On: 09/08/2017 11:47   Impression: He was admitted  with a kidney stone.  He is having severe pain.  He does have some obstruction of the ureter.  He has coronary disease which is stable. Active Problems:   Kidney stone     Plan: Admit for pain control.  He will need urology consultation when available      Donal Lynam L   09/09/2017, 8:47 AM

## 2017-09-09 NOTE — Progress Notes (Signed)
11 mm calculus at right ureter junction partial obstruction likewise he has a contracted gallbladder with calculus pain yesterday was more right upper quadrant with guarding this is normalized today and his pain is well controlled with IV Dilaudid.  Urology consult is in progress we will see him in a.m. presumably Jorge Bishop:[id number] DOB: 1935/06/26 DOA: 09/08/2017 PCP: Jorge Gaskins, MD   Physical Exam: Blood pressure (!) 148/74, pulse 65, temperature 98.4 F (36.9 C), temperature source Oral, resp. rate 16, height 6' (1.829 m), weight 79.8 kg (175 lb 15.5 oz), SpO2 100 %.  Lungs clear to A&P no rales wheezes rhonchi heart regular rhythm no S3-S4 no heaves rubs abdomen soft nontender bowel sounds normoactive   Investigations:  No results found for this or any previous visit (from the past 240 hour(s)).   Basic Metabolic Panel: Recent Labs    09/08/17 1722  NA 137  K 4.7  CL 102  CO2 22  GLUCOSE 109*  BUN 25*  CREATININE 1.43*  CALCIUM 9.3   Liver Function Tests: Recent Labs    09/08/17 1722  AST 29  ALT 36  ALKPHOS 65  BILITOT 2.4*  PROT 7.5  ALBUMIN 3.9     CBC: Recent Labs    09/08/17 1722  WBC 10.1  NEUTROABS 7.8*  HGB 15.6  HCT 48.4  MCV 92.5  PLT 76*    Ct Abdomen Pelvis W Contrast  Result Date: 09/08/2017 CLINICAL DATA:  Generalized abdominal pain EXAM: CT ABDOMEN AND PELVIS WITH CONTRAST TECHNIQUE: Multidetector CT imaging of the abdomen and pelvis was performed using the standard protocol following bolus administration of intravenous contrast. CONTRAST:  15m ISOVUE-300 IOPAMIDOL (ISOVUE-300) INJECTION 61% COMPARISON:  CT abdomen 03/22/2008 FINDINGS: Lower chest: Negative Hepatobiliary: Normal liver. Contracted gallbladder with calcified gallstones. Negative for gallbladder wall thickening. No biliary dilatation. Pancreas: Negative Spleen: Negative Adrenals/Urinary Tract: 11 mm calculus proximal right ureter at the UVJ causing moderate  obstruction. 3 x 5 mm right upper pole calculus. No calculi on the left. Extrarenal pelvis bilaterally. Mild bladder wall thickening due to chronic bladder outlet obstruction with large prostate. Stomach/Bowel: Moderately large hiatal hernia with evidence of prior hiatal hernia surgery. Negative for bowel obstruction. No bowel mass or edema. Sigmoid diverticulosis without acute edema. Normal appendix Vascular/Lymphatic: Atherosclerotic aorta without aneurysm. No lymphadenopathy. Reproductive: Prostate is significantly enlarged measuring 4.9 x 6.1 cm. Other: Negative for free fluid.  Negative for hernia. Musculoskeletal: Lumbar disc and facet degeneration. No acute skeletal abnormality. IMPRESSION: 11 mm calculus proximal right ureter causing moderate obstruction. 3 x 5 mm nonobstructing calculus right upper pole Sigmoid diverticulosis Prostate enlargement with bladder wall thickening. These results will be called to the ordering clinician or representative by the Radiologist Assistant, and communication documented in the PACS or zVision Dashboard. Electronically Signed   By: CFranchot GalloM.D.   On: 09/08/2017 11:47      Medications:   Impression:  Active Problems:   Kidney stone     Plan: Urology consult requested.  And IV Dilaudid and fluids at present be met in a.m.  Consultants: Urology   Procedures   Antibiotics:     Time spent: 30 minutes   LOS: 1 day   [name]   09/09/2017, 1:45 PM

## 2017-09-10 ENCOUNTER — Other Ambulatory Visit: Payer: Self-pay | Admitting: Urology

## 2017-09-10 ENCOUNTER — Inpatient Hospital Stay (HOSPITAL_COMMUNITY): Payer: Medicare Other

## 2017-09-10 DIAGNOSIS — I429 Cardiomyopathy, unspecified: Secondary | ICD-10-CM

## 2017-09-10 DIAGNOSIS — R1031 Right lower quadrant pain: Secondary | ICD-10-CM

## 2017-09-10 DIAGNOSIS — N200 Calculus of kidney: Secondary | ICD-10-CM

## 2017-09-10 LAB — COMPREHENSIVE METABOLIC PANEL
ALBUMIN: 3 g/dL — AB (ref 3.5–5.0)
ALT: 26 U/L (ref 17–63)
AST: 25 U/L (ref 15–41)
Alkaline Phosphatase: 54 U/L (ref 38–126)
Anion gap: 11 (ref 5–15)
BUN: 26 mg/dL — AB (ref 6–20)
CHLORIDE: 105 mmol/L (ref 101–111)
CO2: 20 mmol/L — AB (ref 22–32)
Calcium: 8.5 mg/dL — ABNORMAL LOW (ref 8.9–10.3)
Creatinine, Ser: 1.25 mg/dL — ABNORMAL HIGH (ref 0.61–1.24)
GFR calc Af Amer: >60 mL/min (ref >60)
GFR calc non Af Amer: 53 mL/min — ABNORMAL LOW (ref >60)
Glucose, Bld: 118 mg/dL — ABNORMAL HIGH (ref 65–99)
POTASSIUM: 4.5 mmol/L (ref 3.5–5.1)
SODIUM: 136 mmol/L (ref 135–145)
Total Bilirubin: 1.6 mg/dL — ABNORMAL HIGH (ref 0.3–1.2)
Total Protein: 6 g/dL — ABNORMAL LOW (ref 6.5–8.1)

## 2017-09-10 LAB — ECHOCARDIOGRAM COMPLETE
HEIGHTINCHES: 72 in
WEIGHTICAEL: 2815.54 [oz_av]

## 2017-09-10 LAB — CBC
HCT: 43.1 % (ref 39.0–52.0)
Hemoglobin: 13.6 g/dL (ref 13.0–17.0)
MCH: 29.7 pg (ref 26.0–34.0)
MCHC: 31.6 g/dL (ref 30.0–36.0)
MCV: 94.1 fL (ref 78.0–100.0)
PLATELETS: 77 10*3/uL — AB (ref 150–400)
RBC: 4.58 MIL/uL (ref 4.22–5.81)
RDW: 13.9 % (ref 11.5–15.5)
WBC: 5.9 10*3/uL (ref 4.0–10.5)

## 2017-09-10 LAB — URINE CULTURE: CULTURE: NO GROWTH

## 2017-09-10 MED ORDER — HYDROMORPHONE HCL 2 MG PO TABS
2.0000 mg | ORAL_TABLET | ORAL | Status: DC | PRN
  Administered 2017-09-10: 2 mg via ORAL
  Filled 2017-09-10: qty 1

## 2017-09-10 NOTE — Progress Notes (Signed)
*  PRELIMINARY RESULTS* Echocardiogram 2D Echocardiogram has been performed.  Jorge Bishop 09/10/2017, 3:40 PM

## 2017-09-10 NOTE — Progress Notes (Signed)
Patient seen by urology later on his options he has elected for lithotripsy on Monday we will dismiss him in a.m. on p.o. Dilaudid if he tolerates his pain medicine via oral route alone will discontinue aspirin and do 2D echocardiogram to assess left ventricular contractility as the previous one was before 2012 twelve-lead EKG ordered for preop evaluation Jorge Bishop OJJ:[id number] DOB: 10-26-1935 DOA: 09/08/2017 PCP: [name], MD   Physical Exam: Blood pressure (!) 142/59, pulse (!) 58, temperature 98.8 F (37.1 C), temperature source Oral, resp. rate 20, height 6' (1.829 m), weight 79.8 kg (175 lb 15.5 oz), SpO2 100 %.  Lungs clear to A&P no rales or rhonchi heart regular rhythm no S3-S4 no heaves thrills or rubs abdomen soft nontender bowel sounds normoactive   Investigations:  Recent Results (from the past 240 hour(s))  Urine culture     Status: None   Collection Time: 09/09/17  5:38 AM  Result Value Ref Range Status   Specimen Description   Final    URINE, CLEAN CATCH Performed at [facility], [address], [place], [place] [address]    Special Requests   Final    NONE Performed at [facility], [address], [place], [place] [address]    Culture   Final    NO GROWTH Performed at [facility], [place] [address] [place], [place] [address]    Report Status 09/10/2017 FINAL  Final     Basic Metabolic Panel: Recent Labs    09/08/17 1722 09/10/17 0508  NA 137 136  K 4.7 4.5  CL 102 105  CO2 22 20*  GLUCOSE 109* 118*  BUN 25* 26*  CREATININE 1.43* 1.25*  CALCIUM 9.3 8.5*   Liver Function Tests: Recent Labs    09/08/17 1722 09/10/17 0508  AST 29 25  ALT 36 26  ALKPHOS 65 54  BILITOT 2.4* 1.6*  PROT 7.5 6.0*  ALBUMIN 3.9 3.0*     CBC: Recent Labs    09/08/17 1722  WBC 10.1  NEUTROABS 7.8*  HGB 15.6  HCT 48.4  MCV 92.5  PLT 76*    No results found.    Medications:   Impression:  Active Problems:   Kidney stone     Plan:  2D echo today twelve-lead ECG ordered discontinue aspirin switch to p.o. Dilaudid to see how he tolerates analgesia via oral medication alone  Consultants: Urology   Procedures   Antibiotics:          Time spent 30 minutes   LOS: 2 days   Jorge Bishop M   09/10/2017, 12:33 PM

## 2017-09-10 NOTE — Care Management Important Message (Signed)
Important Message  Patient Details  Name: Jorge Bishop MRN: [id number] Date of Birth: 1936/04/09   Medicare Important Message Given:  Yes    Shelda Altes 09/10/2017, 12:02 PM

## 2017-09-10 NOTE — Consult Note (Signed)
Subjective: CC: right flank pain.  Hx:  Mr. Tanzi is an 82 yo WM who I was asked to see by Dr. Lorriane Shire for an 87mm RUPJ stone with pain.   He had the onset a week ago of severe right flank pain with N/V.  The pain abated after a couple of days but recurred midweek and he was admitted for pain control on 09/08/17.   He has good relief with dilaudid.   He has no significant voiding complaints but has been on tamsulosin 0.8mg  daily for a few years.   He has had no gross hematuria.   He has no prior stones or other GU history.   ROS:  Review of Systems  Constitutional: Negative for chills and fever.  Respiratory: Negative for shortness of breath.   Cardiovascular: Negative for chest pain.  Gastrointestinal: Positive for abdominal pain (RUQ).  All other systems reviewed and are negative.   Allergies  Allergen Reactions  . Bactrim Other (See Comments)    Throat close  . Clarithromycin Anaphylaxis    REACTION: UNKNOWN REACTION  . Codeine Nausea And Vomiting    REACTION: UNKNOWN REACTION  . Erythromycin Nausea And Vomiting  . Sulfamethoxazole-Trimethoprim Anaphylaxis    Past Medical History:  Diagnosis Date  . Arteriosclerotic cardiovascular disease (ASCVD)    Coronary artery bypass graft surgery in 12/98; DES to the RCA SVG in 07/2002; restenosis of the saphenous vein graft stent and renal intervention in 2006; 70% first diagonal present; calf in 11/2007-total obstruction of the saphenous vein graft to the first marginal  . Benign prostatic hypertrophy   . Cerebrovascular disease    60% bilateral internal carotid artery stenosis in 2012  . Diverticulosis   . DJD (degenerative joint disease)   . Erectile dysfunction   . GERD (gastroesophageal reflux disease)   . Hyperlipidemia    Lipid profile in 12/2009:104, 91, 36, 50  . Migraine headache   . Right bundle branch block   . Shortness of breath   . Thrombocytopenia (HCC)    platelets of 104 in 03/2009  . Upper gastrointestinal  hemorrhage 16/96    Helicobacter pylori positive; subsequent laparoscopic fundoplication-2009    Past Surgical History:  Procedure Laterality Date  . CARDIAC CATHETERIZATION     4 cardiac stents  . CATARACT EXTRACTION W/PHACO Right 07/25/2012   Procedure: CATARACT EXTRACTION PHACO AND INTRAOCULAR LENS PLACEMENT (IOC);  Surgeon: Tonny Branch, MD;  Location: [facility];  Service: Ophthalmology;  Laterality: Right;  CDE:23.31  . CATARACT EXTRACTION W/PHACO Left 08/18/2012   Procedure: CATARACT EXTRACTION PHACO AND INTRAOCULAR LENS PLACEMENT (IOC);  Surgeon: Tonny Branch, MD;  Location: [facility];  Service: Ophthalmology;  Laterality: Left;  CDE: 16.07  . COLONOSCOPY N/A 08/01/2014   Procedure: COLONOSCOPY;  Surgeon: Rogene Houston, MD;  Location: [facility];  Service: Endoscopy;  Laterality: N/A;  125 - moved to 10:30 - Ann to notify pt  . CORONARY ARTERY BYPASS GRAFT  1998   . HERNIA REPAIR     right inguinal  . LAPAROSCOPIC NISSEN FUNDOPLICATION  7893, 8101    revision in 2009 along with repair of paraesophagel hernia   . PARAESOPHAGEAL HERNIA REPAIR      Social History   Socioeconomic History  . Marital status: Married    Spouse name: Not on file  . Number of children: Not on file  . Years of education: Not on file  . Highest education level: Not on file  Occupational History  . Occupation: Full  time    Employer: RETIRED  Social Needs  . Financial resource strain: Not on file  . Food insecurity:    Worry: Not on file    Inability: Not on file  . Transportation needs:    Medical: Not on file    Non-medical: Not on file  Tobacco Use  . Smoking status: Never Smoker  . Smokeless tobacco: Never Used  Substance and Sexual Activity  . Alcohol use: No    Alcohol/week: 0.0 oz  . Drug use: No  . Sexual activity: Yes    Birth control/protection: None  Lifestyle  . Physical activity:    Days per week: Not on file    Minutes per session: Not on file  . Stress: Not on file   Relationships  . Social connections:    Talks on phone: Not on file    Gets together: Not on file    Attends religious service: Not on file    Active member of club or organization: Not on file    Attends meetings of clubs or organizations: Not on file    Relationship status: Not on file  . Intimate partner violence:    Fear of current or ex partner: Not on file    Emotionally abused: Not on file    Physically abused: Not on file    Forced sexual activity: Not on file  Other Topics Concern  . Not on file  Social History Narrative  . Not on file    History reviewed. No pertinent family history.  Anti-infectives: Anti-infectives (From admission, onward)   None      Current Facility-Administered Medications  Medication Dose Route Frequency Provider Last Rate Last Dose  . 0.9 %  sodium chloride infusion   Intravenous Continuous Sinda Du, MD 50 mL/hr at 09/09/17 1150    . acetaminophen (TYLENOL) tablet 650 mg  650 mg Oral Q6H PRN Sinda Du, MD       Or  . acetaminophen (TYLENOL) suppository 650 mg  650 mg Rectal Q6H PRN Sinda Du, MD      . aspirin chewable tablet 81 mg  81 mg Oral Daily Sinda Du, MD   81 mg at 09/10/17 1113  . atorvastatin (LIPITOR) tablet 20 mg  20 mg Oral Elana Alm, MD   20 mg at 09/09/17 2155  . carvedilol (COREG) tablet 6.25 mg  6.25 mg Oral BID Sinda Du, MD   6.25 mg at 09/10/17 1112  . enoxaparin (LOVENOX) injection 40 mg  40 mg Subcutaneous Q24H Sinda Du, MD   40 mg at 09/09/17 2156  . ezetimibe (ZETIA) tablet 10 mg  10 mg Oral Daily Sinda Du, MD   10 mg at 09/10/17 1113  . HYDROmorphone (DILAUDID) injection 0.5 mg  0.5 mg Intravenous Q4H PRN Sinda Du, MD   0.5 mg at 09/09/17 2158  . loperamide (IMODIUM) 1 MG/5ML solution 1 mg  1 mg Oral PRN Sinda Du, MD      . niacin (NIASPAN) CR tablet 500 mg  500 mg Oral TID Sinda Du, MD   500 mg at 09/09/17 2157  . promethazine (PHENERGAN)  injection 12.5 mg  12.5 mg Intravenous Q8H PRN Gardiner Barefoot, NP   12.5 mg at 09/09/17 2155  . tamsulosin (FLOMAX) capsule 0.8 mg  0.8 mg Oral BID Sinda Du, MD   0.8 mg at 09/10/17 1113     Objective: Vital signs in last 24 hours: Temp:  [98.8 F (37.1 C)-99.1 F (37.3 C)]  98.8 F (37.1 C) (03/29 0606) Pulse Rate:  [58-67] 58 (03/29 0606) Resp:  [20] 20 (03/29 0606) BP: (124-142)/(51-59) 142/59 (03/29 0606) SpO2:  [99 %-100 %] 100 % (03/29 0606)  Intake/Output from previous day: 03/28 0701 - 03/29 0700 In: 240 [P.O.:240] Out: 600 [Urine:600] Intake/Output this shift: Total I/O In: 240 [P.O.:240] Out: -    Physical Exam  Constitutional: He is oriented to person, place, and time and well-developed, well-nourished, and in no distress.  HENT:  Head: Normocephalic and atraumatic.  Neck: Normal range of motion. Neck supple. No thyromegaly present.  Cardiovascular: Normal rate, regular rhythm and normal heart sounds.  Pulmonary/Chest: Effort normal and breath sounds normal. No respiratory distress.  Abdominal: Soft. There is tenderness (mild RUQ and CVAT).  Musculoskeletal: Normal range of motion. He exhibits no edema or tenderness.  Lymphadenopathy:    He has no cervical adenopathy.  Neurological: He is alert and oriented to person, place, and time.  Skin: Skin is warm and dry.  Psychiatric: Mood and affect normal.  Vitals reviewed.   Lab Results:  Recent Labs    09/08/17 1722  WBC 10.1  HGB 15.6  HCT 48.4  PLT 76*   BMET Recent Labs    09/08/17 1722 09/10/17 0508  NA 137 136  K 4.7 4.5  CL 102 105  CO2 22 20*  GLUCOSE 109* 118*  BUN 25* 26*  CREATININE 1.43* 1.25*  CALCIUM 9.3 8.5*   PT/INR No results for input(s): LABPROT, INR in the last 72 hours. ABG No results for input(s): PHART, HCO3 in the last 72 hours.  Invalid input(s): PCO2, PO2  Studies/Results: No results found.  I have discussed his case with Dr. Lorriane Shire and  reviewed his CT films and reports and labs.   Urine culture is negative.  Assessment: Right UPJ stone with pain.   His pain is controlled with hydromorphone and he may be able to be discharged on PO meds.   I discussed the options for treatment including ESWL on Monday,  Stenting with ESWL or Ureteroscopy with stenting.   He would like to proceed with ESWL and I reviewed the risks of bleeding, infection, injury to the kidney or adjacent organs that can be severe or life threatening, need for secondary procedures for obstructing fragments or failure to fragment, thrombotic events and sedation risks.   I will have him hold the ASA and he will also get an EKG and Echo for his cardiac history.   If he has recurrent pain post discharge, he was instructed to go to the [facility] ER.   He does have thrombocytopenia and that is chronic but his platelet count is 76K at this admission.   That will be rechecked.    CC: Dr. Ayesha Rumpf [facility].      Jorge Bishop 09/10/2017 [id number]

## 2017-09-10 NOTE — Progress Notes (Signed)
Spoke with Estill Dooms nurse taking care of this pt at [facility]; pt due to be discharged on Saturday 10/12/2017. She will instruct pt on lithotripsy instruction for procedure scheduled on Monday 09/13/2017. No solid food after midnight, clear liquids from midnight till 9am day of procedure then nothing by mouth. Pt to take carvedilol am of procedure. Pt to arrive at admitting office here at [facility] by 1300. Estill Dooms stated pt had 81 mg aspirin Friday 09/10/2017 at approx 10 am.

## 2017-09-11 DIAGNOSIS — E43 Unspecified severe protein-calorie malnutrition: Secondary | ICD-10-CM

## 2017-09-11 NOTE — Progress Notes (Signed)
Patient IV removed, telemetry removed, tolerated well. Patient given discharge instructions at bedside.  

## 2017-09-11 NOTE — Discharge Summary (Signed)
Physician Discharge Summary  Jorge Bishop:[id number] DOB: 1935/12/17 DOA: 09/08/2017  PCP: [name], MD  Admit date: 09/08/2017 Discharge date: 09/11/2017   Recommendations for Outpatient Follow-up:  Patient is advised to go home and continue large amounts of fluids to flush his genitourinary system.  Take all home medicines to report to [name] Monday morning for lithotripsy of right ureteral kidney stone.  He has prescription for Dilaudid 4 mg number 30 tablets 1 p.o. 3 times daily should pain become intractable stone fragments after lithotripsy. Discharge Diagnoses:  Active Problems:   Kidney stone   Protein-calorie malnutrition, severe   Discharge Condition: Good  Filed Weights   09/08/17 1705 09/09/17 0535  Weight: 79.8 kg (175 lb 14.8 oz) 79.8 kg (175 lb 15.5 oz)    History of present illness:  Patient is an 82 year old white male with a history of coronary artery disease status post CABG x4 with 4 subsequent stents ejection fraction 55% documented by echo on this admission who presented with right-sided flank pain was found to have an 11 mm kidney stone which was at the proximal entrance to the right ureter he was given intravenous Dilaudid with adequate pain control seen in consultation by urology who felt the best option was to perform lithotripsy Monday morning as an outpatient follow-up with [facility] he was given Dilaudid 4 mg number 30tablets 1 p.o. 3 times daily to take home and he is advised to take all of his previous prehospital admission medicines he was found to be in sinus rhythm and this admission he was hemodynamically stable the only other lab abnormalities were mild diminishing of protein and albumin to 6.0 and 3.0 we will deal with this as an outpatient  Hospital Course:  See HPI above  Procedures:    Consultations:  Urology Dr. Roni Bread  Discharge Instructions  Discharge Instructions    Discharge instructions   Complete by:   As directed    Discharge patient   Complete by:  As directed    Discharge disposition:  01-Home or Self Care   Discharge patient date:  09/11/2017     Allergies as of 09/11/2017      Reactions   Bactrim Other (See Comments)   Throat close   Clarithromycin Anaphylaxis   REACTION: UNKNOWN REACTION   Codeine Nausea And Vomiting   REACTION: UNKNOWN REACTION   Erythromycin Nausea And Vomiting   Sulfamethoxazole-trimethoprim Anaphylaxis      Medication List    TAKE these medications   ASPIRIN LOW DOSE 81 MG tablet Generic drug:  aspirin Take 81 mg by mouth daily.   atorvastatin 20 MG tablet Commonly known as:  LIPITOR Take 20 mg by mouth at bedtime.   carvedilol 6.25 MG tablet Commonly known as:  COREG Take 1 tablet (6.25 mg total) by mouth 2 (two) times daily.   ezetimibe 10 MG tablet Commonly known as:  ZETIA Take 10 mg by mouth daily.   loperamide 2 MG tablet Commonly known as:  IMODIUM A-D Take 0.5 tablets (1 mg total) by mouth at bedtime. What changed:    when to take this  reasons to take this   niacin 500 MG CR tablet Commonly known as:  NIASPAN Take 500 mg by mouth 3 (three) times daily. 1 in the morning and 2 in the evening   omeprazole 20 MG capsule Commonly known as:  PRILOSEC Take 20 mg by mouth as needed (acid reflux). prn   ondansetron 4 MG tablet  Commonly known as:  ZOFRAN Take 4 mg by mouth 3 (three) times daily.   polycarbophil 625 MG tablet Commonly known as:  FIBERCON Take by mouth as needed. 4 gms daily   tamsulosin 0.4 MG Caps capsule Commonly known as:  FLOMAX Take 0.8 mg by mouth daily. 1 tablet in the am and 1 in the pm      Allergies  Allergen Reactions  . Bactrim Other (See Comments)    Throat close  . Clarithromycin Anaphylaxis    REACTION: UNKNOWN REACTION  . Codeine Nausea And Vomiting    REACTION: UNKNOWN REACTION  . Erythromycin Nausea And Vomiting  . Sulfamethoxazole-Trimethoprim Anaphylaxis      The results  of significant diagnostics from this hospitalization (including imaging, microbiology, ancillary and laboratory) are listed below for reference.    Significant Diagnostic Studies: Ct Abdomen Pelvis W Contrast  Result Date: 09/08/2017 CLINICAL DATA:  Generalized abdominal pain EXAM: CT ABDOMEN AND PELVIS WITH CONTRAST TECHNIQUE: Multidetector CT imaging of the abdomen and pelvis was performed using the standard protocol following bolus administration of intravenous contrast. CONTRAST:  10mL ISOVUE-300 IOPAMIDOL (ISOVUE-300) INJECTION 61% COMPARISON:  CT abdomen 03/22/2008 FINDINGS: Lower chest: Negative Hepatobiliary: Normal liver. Contracted gallbladder with calcified gallstones. Negative for gallbladder wall thickening. No biliary dilatation. Pancreas: Negative Spleen: Negative Adrenals/Urinary Tract: 11 mm calculus proximal right ureter at the UVJ causing moderate obstruction. 3 x 5 mm right upper pole calculus. No calculi on the left. Extrarenal pelvis bilaterally. Mild bladder wall thickening due to chronic bladder outlet obstruction with large prostate. Stomach/Bowel: Moderately large hiatal hernia with evidence of prior hiatal hernia surgery. Negative for bowel obstruction. No bowel mass or edema. Sigmoid diverticulosis without acute edema. Normal appendix Vascular/Lymphatic: Atherosclerotic aorta without aneurysm. No lymphadenopathy. Reproductive: Prostate is significantly enlarged measuring 4.9 x 6.1 cm. Other: Negative for free fluid.  Negative for hernia. Musculoskeletal: Lumbar disc and facet degeneration. No acute skeletal abnormality. IMPRESSION: 11 mm calculus proximal right ureter causing moderate obstruction. 3 x 5 mm nonobstructing calculus right upper pole Sigmoid diverticulosis Prostate enlargement with bladder wall thickening. These results will be called to the ordering clinician or representative by the Radiologist Assistant, and communication documented in the PACS or zVision Dashboard.  Electronically Signed   By: Franchot Gallo M.D.   On: 09/08/2017 11:47    Microbiology: Recent Results (from the past 240 hour(s))  Urine culture     Status: None   Collection Time: 09/09/17  5:38 AM  Result Value Ref Range Status   Specimen Description   Final    URINE, CLEAN CATCH Performed at [facility], [address], [place], [place] [address]    Special Requests   Final    NONE Performed at [facility], [address], [place], [place] [address]    Culture   Final    NO GROWTH Performed at [facility], [place] [address] [place], [place] [address]    Report Status 09/10/2017 FINAL  Final     Labs: Basic Metabolic Panel: Recent Labs  Lab 09/08/17 1722 09/10/17 0508  NA 137 136  K 4.7 4.5  CL 102 105  CO2 22 20*  GLUCOSE 109* 118*  BUN 25* 26*  CREATININE 1.43* 1.25*  CALCIUM 9.3 8.5*   Liver Function Tests: Recent Labs  Lab 09/08/17 1722 09/10/17 0508  AST 29 25  ALT 36 26  ALKPHOS 65 54  BILITOT 2.4* 1.6*  PROT 7.5 6.0*  ALBUMIN 3.9 3.0*   No  results for input(s): LIPASE, AMYLASE in the last 168 hours. No results for input(s): AMMONIA in the last 168 hours. CBC: Recent Labs  Lab 09/08/17 1722 09/10/17 1336  WBC 10.1 5.9  NEUTROABS 7.8*  --   HGB 15.6 13.6  HCT 48.4 43.1  MCV 92.5 94.1  PLT 76* 77*   Cardiac Enzymes: No results for input(s): CKTOTAL, CKMB, CKMBINDEX, TROPONINI in the last 168 hours. BNP: BNP (last 3 results) No results for input(s): BNP in the last 8760 hours.  ProBNP (last 3 results) No results for input(s): PROBNP in the last 8760 hours.  CBG: No results for input(s): GLUCAP in the last 168 hours.     Signed:  Maricela Curet   Pager: [phone] 09/11/2017, 9:30 AM

## 2017-09-13 ENCOUNTER — Encounter (HOSPITAL_COMMUNITY)
Admission: RE | Disposition: A | Discharge status: Home / Self Care | Payer: Self-pay | Source: Ambulatory Visit | Attending: Urology

## 2017-09-13 ENCOUNTER — Encounter (HOSPITAL_COMMUNITY): Payer: Self-pay | Admitting: *Deleted

## 2017-09-13 ENCOUNTER — Ambulatory Visit (HOSPITAL_COMMUNITY): Payer: Medicare Other

## 2017-09-13 ENCOUNTER — Other Ambulatory Visit: Payer: Self-pay

## 2017-09-13 ENCOUNTER — Ambulatory Visit (HOSPITAL_COMMUNITY)
Admission: RE | Admit: 2017-09-13 | Discharge: 2017-09-13 | Disposition: A | Discharge status: Home / Self Care | Payer: Medicare Other | Source: Ambulatory Visit | Attending: Urology | Admitting: Urology

## 2017-09-13 DIAGNOSIS — Z951 Presence of aortocoronary bypass graft: Secondary | ICD-10-CM | POA: Diagnosis not present

## 2017-09-13 DIAGNOSIS — N200 Calculus of kidney: Secondary | ICD-10-CM | POA: Diagnosis not present

## 2017-09-13 DIAGNOSIS — Z955 Presence of coronary angioplasty implant and graft: Secondary | ICD-10-CM | POA: Insufficient documentation

## 2017-09-13 HISTORY — DX: Atherosclerotic heart disease of native coronary artery without angina pectoris: I25.10

## 2017-09-13 HISTORY — DX: Personal history of other medical treatment: Z92.89

## 2017-09-13 HISTORY — PX: EXTRACORPOREAL SHOCK WAVE LITHOTRIPSY: SHX1557

## 2017-09-13 HISTORY — DX: Personal history of urinary calculi: Z87.442

## 2017-09-13 SURGERY — LITHOTRIPSY, ESWL
Anesthesia: LOCAL | Laterality: Right

## 2017-09-13 MED ORDER — SODIUM CHLORIDE 0.9 % IV SOLN
INTRAVENOUS | Status: DC
  Administered 2017-09-13: 14:00:00 via INTRAVENOUS

## 2017-09-13 MED ORDER — DIAZEPAM 5 MG PO TABS
10.0000 mg | ORAL_TABLET | ORAL | Status: AC
  Administered 2017-09-13: 10 mg via ORAL
  Filled 2017-09-13: qty 2

## 2017-09-13 MED ORDER — DIPHENHYDRAMINE HCL 25 MG PO CAPS
25.0000 mg | ORAL_CAPSULE | ORAL | Status: AC
  Administered 2017-09-13: 25 mg via ORAL
  Filled 2017-09-13: qty 1

## 2017-09-13 MED ORDER — CIPROFLOXACIN HCL 500 MG PO TABS
500.0000 mg | ORAL_TABLET | ORAL | Status: AC
  Administered 2017-09-13: 500 mg via ORAL
  Filled 2017-09-13: qty 1

## 2017-09-13 NOTE — Discharge Instructions (Addendum)
Lithotripsy, Care After °This sheet gives you information about how to care for yourself after your procedure. Your health care provider may also give you more specific instructions. If you have problems or questions, contact your health care provider. °What can I expect after the procedure? °After the procedure, it is common to have: °· Some blood in your urine. This should only last for a few days. °· Soreness in your back, sides, or upper abdomen for a few days. °· Blotches or bruises on your back where the pressure wave entered the skin. °· Pain, discomfort, or nausea when pieces (fragments) of the kidney stone move through the tube that carries urine from the kidney to the bladder (ureter). Stone fragments may pass soon after the procedure, but they may continue to pass for up to 4-8 weeks. °? If you have severe pain or nausea, contact your health care provider. This may be caused by a large stone that was not broken up, and this may mean that you need more treatment. °· Some pain or discomfort during urination. °· Some pain or discomfort in the lower abdomen or (in men) at the base of the penis. ° °Follow these instructions at home: °Medicines °· Take over-the-counter and prescription medicines only as told by your health care provider. °· If you were prescribed an antibiotic medicine, take it as told by your health care provider. Do not stop taking the antibiotic even if you start to feel better. °· Do not drive for 24 hours if you were given a medicine to help you relax (sedative). °· Do not drive or use heavy machinery while taking prescription pain medicine. °Eating and drinking °· Drink enough water and fluids to keep your urine clear or pale yellow. This helps any remaining pieces of the stone to pass. It can also help prevent new stones from forming. °· Eat plenty of fresh fruits and vegetables. °· Follow instructions from your health care provider about eating and drinking restrictions. You may be  instructed: °? To reduce how much salt (sodium) you eat or drink. Check ingredients and nutrition facts on packaged foods and beverages. °? To reduce how much meat you eat. °· Eat the recommended amount of calcium for your age and gender. Ask your health care provider how much calcium you should have. °General instructions °· Get plenty of rest. °· Most people can resume normal activities 1-2 days after the procedure. Ask your health care provider what activities are safe for you. °· If directed, strain all urine through the strainer that was provided by your health care provider. °? Keep all fragments for your health care provider to see. Any stones that are found may be sent to a medical lab for examination. The stone may be as small as a grain of salt. °· Keep all follow-up visits as told by your health care provider. This is important. °Contact a health care provider if: °· You have pain that is severe or does not get better with medicine. °· You have nausea that is severe or does not go away. °· You have blood in your urine longer than your health care provider told you to expect. °· You have more blood in your urine. °· You have pain during urination that does not go away. °· You urinate more frequently than usual and this does not go away. °· You develop a rash or any other possible signs of an allergic reaction. °Get help right away if: °· You have severe pain in   your back, sides, or upper abdomen. °· You have severe pain while urinating. °· Your urine is very dark red. °· You have blood in your stool (feces). °· You cannot pass any urine at all. °· You feel a strong urge to urinate after emptying your bladder. °· You have a fever or chills. °· You develop shortness of breath, difficulty breathing, or chest pain. °· You have severe nausea that leads to persistent vomiting. °· You faint. °Summary °· After this procedure, it is common to have some pain, discomfort, or nausea when pieces (fragments) of the  kidney stone move through the tube that carries urine from the kidney to the bladder (ureter). If this pain or nausea is severe, however, you should contact your health care provider. °· Most people can resume normal activities 1-2 days after the procedure. Ask your health care provider what activities are safe for you. °· Drink enough water and fluids to keep your urine clear or pale yellow. This helps any remaining pieces of the stone to pass, and it can help prevent new stones from forming. °· If directed, strain your urine and keep all fragments for your health care provider to see. Fragments or stones may be as small as a grain of salt. °· Get help right away if you have severe pain in your back, sides, or upper abdomen or have severe pain while urinating. °This information is not intended to replace advice given to you by your health care provider. Make sure you discuss any questions you have with your health care provider. °Document Released: 06/21/2007 Document Revised: 04/22/2016 Document Reviewed: 04/22/2016 °Elsevier Interactive Patient Education © 2018 [employer] °Moderate Conscious Sedation, Adult, Care After °These instructions provide you with information about caring for yourself after your procedure. Your health care provider may also give you more specific instructions. Your treatment has been planned according to current medical practices, but problems sometimes occur. Call your health care provider if you have any problems or questions after your procedure. °What can I expect after the procedure? °After your procedure, it is common: °· To feel sleepy for several hours. °· To feel clumsy and have poor balance for several hours. °· To have poor judgment for several hours. °· To vomit if you eat too soon. ° °Follow these instructions at home: °For at least 24 hours after the procedure: ° °· Do not: °? Participate in activities where you could fall or become injured. °? Drive. °? Use heavy  machinery. °? Drink alcohol. °? Take sleeping pills or medicines that cause drowsiness. °? Make important decisions or sign legal documents. °? Take care of children on your own. °· Rest. °Eating and drinking °· Follow the diet recommended by your health care provider. °· If you vomit: °? Drink water, juice, or soup when you can drink without vomiting. °? Make sure you have little or no nausea before eating solid foods. °General instructions °· Have a responsible adult stay with you until you are awake and alert. °· Take over-the-counter and prescription medicines only as told by your health care provider. °· If you smoke, do not smoke without supervision. °· Keep all follow-up visits as told by your health care provider. This is important. °Contact a health care provider if: °· You keep feeling nauseous or you keep vomiting. °· You feel light-headed. °· You develop a rash. °· You have a fever. °Get help right away if: °· You have trouble breathing. °This information is not intended to replace advice   given to you by your health care provider. Make sure you discuss any questions you have with your health care provider. °Document Released: 03/22/2013 Document Revised: 11/04/2015 Document Reviewed: 09/21/2015 °Elsevier Interactive Patient Education © 2018 [employer] ° °

## 2017-09-14 ENCOUNTER — Encounter (HOSPITAL_COMMUNITY): Payer: Self-pay | Admitting: Urology

## 2017-10-14 ENCOUNTER — Ambulatory Visit (HOSPITAL_COMMUNITY)
Admission: RE | Admit: 2017-10-14 | Discharge: 2017-10-14 | Disposition: A | Discharge status: Home / Self Care | Payer: Medicare Other | Source: Ambulatory Visit | Attending: Urology | Admitting: Urology

## 2017-10-14 ENCOUNTER — Other Ambulatory Visit: Payer: Self-pay | Admitting: Urology

## 2017-10-14 DIAGNOSIS — N200 Calculus of kidney: Secondary | ICD-10-CM

## 2017-10-15 ENCOUNTER — Other Ambulatory Visit (HOSPITAL_COMMUNITY)
Admission: AD | Admit: 2017-10-15 | Discharge: 2017-10-15 | Disposition: A | Discharge status: Home / Self Care | Payer: Medicare Other | Source: Other Acute Inpatient Hospital | Attending: Urology | Admitting: Urology

## 2017-10-15 ENCOUNTER — Ambulatory Visit (INDEPENDENT_AMBULATORY_CARE_PROVIDER_SITE_OTHER): Payer: Medicare Other | Admitting: Urology

## 2017-10-15 DIAGNOSIS — N201 Calculus of ureter: Secondary | ICD-10-CM | POA: Insufficient documentation

## 2017-10-21 ENCOUNTER — Other Ambulatory Visit: Payer: Self-pay | Admitting: Urology

## 2017-10-21 DIAGNOSIS — N201 Calculus of ureter: Secondary | ICD-10-CM

## 2017-10-21 LAB — STONE ANALYSIS
CA PHOS CRY STONE QL IR: 3 %
Ca Oxalate,Monohydr.: 97 %
Stone Weight KSTONE: 607.1 mg

## 2017-10-26 ENCOUNTER — Other Ambulatory Visit (HOSPITAL_COMMUNITY): Payer: Self-pay | Admitting: Family Medicine

## 2017-10-26 DIAGNOSIS — G8929 Other chronic pain: Secondary | ICD-10-CM

## 2017-10-26 DIAGNOSIS — M5441 Lumbago with sciatica, right side: Principal | ICD-10-CM

## 2017-10-27 ENCOUNTER — Ambulatory Visit (HOSPITAL_COMMUNITY)
Admission: RE | Admit: 2017-10-27 | Discharge: 2017-10-27 | Disposition: A | Discharge status: Home / Self Care | Payer: Medicare Other | Source: Ambulatory Visit | Attending: Urology | Admitting: Urology

## 2017-10-27 DIAGNOSIS — N200 Calculus of kidney: Secondary | ICD-10-CM | POA: Insufficient documentation

## 2017-10-27 DIAGNOSIS — N201 Calculus of ureter: Secondary | ICD-10-CM

## 2017-10-27 DIAGNOSIS — N281 Cyst of kidney, acquired: Secondary | ICD-10-CM | POA: Insufficient documentation

## 2017-11-03 ENCOUNTER — Ambulatory Visit (HOSPITAL_COMMUNITY)
Admission: RE | Admit: 2017-11-03 | Discharge: 2017-11-03 | Disposition: A | Discharge status: Home / Self Care | Payer: Medicare Other | Source: Ambulatory Visit | Attending: Family Medicine | Admitting: Family Medicine

## 2017-11-03 DIAGNOSIS — M5441 Lumbago with sciatica, right side: Secondary | ICD-10-CM | POA: Insufficient documentation

## 2017-11-03 DIAGNOSIS — G8929 Other chronic pain: Secondary | ICD-10-CM

## 2017-11-03 DIAGNOSIS — M48061 Spinal stenosis, lumbar region without neurogenic claudication: Secondary | ICD-10-CM | POA: Insufficient documentation

## 2017-11-05 ENCOUNTER — Ambulatory Visit (INDEPENDENT_AMBULATORY_CARE_PROVIDER_SITE_OTHER): Payer: Medicare Other | Admitting: Urology

## 2017-11-05 ENCOUNTER — Other Ambulatory Visit: Payer: Self-pay | Admitting: Urology

## 2017-11-05 DIAGNOSIS — N201 Calculus of ureter: Secondary | ICD-10-CM

## 2017-11-09 NOTE — Patient Instructions (Signed)
Jorge Bishop  11/09/2017     @PREFPERIOPPHARMACY @   Your procedure is scheduled on 11/12/2017.  Report to [facility] at 10:00 A.M.  Call this number if you have problems the morning of surgery:  [phone]   Remember:  Nothing to eat or drink after midnight      Take these medicines the morning of surgery with A SIP OF WATER Coreg, Claritin, Prilosec, Phenergan or Zofran if needed, Flomax    Do not wear jewelry, make-up or nail polish.  Do not wear lotions, powders, or perfumes, or deodorant.  Do not shave 48 hours prior to surgery.  Men may shave face and neck.  Do not bring valuables to the hospital.  [facility] is not responsible for any belongings or valuables.  Contacts, dentures or bridgework may not be worn into surgery.  Leave your suitcase in the car.  After surgery it may be brought to your room.  For patients admitted to the hospital, discharge time will be determined by your treatment team.  Patients discharged the day of surgery will not be allowed to drive home.    Please read over the following fact sheets that you were given. Surgical Site Infection Prevention and Anesthesia Post-op Instructions     PATIENT INSTRUCTIONS POST-ANESTHESIA  IMMEDIATELY FOLLOWING SURGERY:  Do not drive or operate machinery for the first twenty four hours after surgery.  Do not make any important decisions for twenty four hours after surgery or while taking narcotic pain medications or sedatives.  If you develop intractable nausea and vomiting or a severe headache please notify your doctor immediately.  FOLLOW-UP:  Please make an appointment with your surgeon as instructed. You do not need to follow up with anesthesia unless specifically instructed to do so.  WOUND CARE INSTRUCTIONS (if applicable):  Keep a dry clean dressing on the anesthesia/puncture wound site if there is drainage.  Once the wound has quit draining you may leave it open to air.  Generally you should leave  the bandage intact for twenty four hours unless there is drainage.  If the epidural site drains for more than 36-48 hours please call the anesthesia department.  QUESTIONS?:  Please feel free to call your physician or the hospital operator if you have any questions, and they will be happy to assist you.      Ureteral Stent Implantation Ureteral stent implantation is a procedure to insert (implant) a flexible, soft, plastic tube (stent) into a tube (ureter) that drains urine from the kidneys. The stent supports the ureter while it heals and helps to drain urine from the kidneys. You may have a ureteral stent implanted after having a procedure to remove a blockage from the ureter (ureterolysis or pyeloplasty).You may also have a stent implanted to open the flow of urine when you have a blockage caused by a kidney stone, tumor, blood clot, or infection. You have two ureters, one on each side of the body. The ureters connect the kidneys to the organ that holds urine until it passes out of the body (bladder). The stent is placed so that one end is in the kidney, and one end is in the bladder. The stent is usually taken out after your ureter has healed. Depending on your condition, you may have a stent for just a few weeks, or you may have a long-term stent that will need to be replaced every few months. Tell a health care provider about:  Any allergies you have.  All medicines you are taking, including vitamins, herbs, eye drops, creams, and over-the-counter medicines.  Any problems you or family members have had with anesthetic medicines.  Any blood disorders you have.  Any surgeries you have had.  Any medical conditions you have.  Whether you are pregnant or may be pregnant. What are the risks? Generally, this is a safe procedure. However, problems may occur, including:  Infection.  Bleeding.  Allergic reactions to medicines.  Damage to other structures or organs. Tearing (perforation)  of the ureter is possible.  Movement of the stent away from where it is placed during surgery (migration).  What happens before the procedure?  Ask your health care provider about: ? Changing or stopping your regular medicines. This is especially important if you are taking diabetes medicines or blood thinners. ? Taking medicines such as aspirin and ibuprofen. These medicines can thin your blood. Do not take these medicines before your procedure if your health care provider instructs you not to.  Follow instructions from your health care provider about eating or drinking restrictions.  Do not drink alcohol and do not use any tobacco products before your procedure, as told by your health care provider.  You may be given antibiotic medicine to help prevent infection.  Plan to have someone take you home after the procedure.  If you go home right after the procedure, plan to have someone with you for 24 hours. What happens during the procedure?  An IV tube will be inserted into one of your veins.  You will be given a medicine to make you fall asleep (general anesthetic). You may also be given a medicine to help you relax (sedative).  A thin, tube-shaped instrument with a light and tiny camera at the end (cystoscope) will be inserted into your urethra. The urethra is the tube that drains urine from the bladder out of the body. In men, the urethra opens at the end of the penis. In women, the urethra opens in front of the vaginal opening.  The cystoscope will be passed into your bladder.  A thin wire (guide wire) will be passed through your bladder and into your ureter. This is used to guide the stent into your ureter.  The stent will be inserted into your ureter.  The guide wire and the cystoscope will be removed.  A flexible tube (catheter) will be inserted through your urethra so that one end is in your bladder. This helps to drain urine from your bladder. The procedure may vary among  hospitals and health care providers. What happens after the procedure?  Your blood pressure, heart rate, breathing rate, and blood oxygen level will be monitored often until the medicines you were given have worn off.  You may continue to receive medicine and fluids through an IV tube.  You may have some soreness or pain in your abdomen and urethra. Medicines will be available to help you.  You will be encouraged to get up and walk around as soon as you can.  You will have a catheter draining your urine.  You will have some blood in your urine.  Do not drive for 24 hours if you received a sedative. This information is not intended to replace advice given to you by your health care provider. Make sure you discuss any questions you have with your health care provider. Document Released: 05/29/2000 Document Revised: 11/07/2015 Document Reviewed: 12/14/2014 Elsevier Interactive Patient Education  2018 [employer]. Ureteroscopy Ureteroscopy is a procedure to check  for and treat problems inside part of the urinary tract. In this procedure, a thin, tube-shaped instrument with a light at the end (ureteroscope) is used to look at the inside of the kidneys and the ureters, which are the tubes that carry urine from the kidneys to the bladder. The ureteroscope is inserted into one or both of the ureters. You may need this procedure if you have frequent urinary tract infections (UTIs), blood in your urine, or a stone in one of your ureters. A ureteroscopy can be done to find the cause of urine blockage in a ureter and to evaluate other abnormalities inside the ureters or kidneys. If stones are found, they can be removed during the procedure. Polyps, abnormal tissue, and some types of tumors can also be removed or treated. The ureteroscope may also have a tool to remove tissue to be checked for disease under a microscope (biopsy). Tell a health care provider about:  Any allergies you have.  All  medicines you are taking, including vitamins, herbs, eye drops, creams, and over-the-counter medicines.  Any problems you or family members have had with anesthetic medicines.  Any blood disorders you have.  Any surgeries you have had.  Any medical conditions you have.  Whether you are pregnant or may be pregnant. What are the risks? Generally, this is a safe procedure. However, problems may occur, including:  Bleeding.  Infection.  Allergic reactions to medicines.  Scarring that narrows the ureter (stricture).  Creating a hole in the ureter (perforation).  What happens before the procedure? Staying hydrated Follow instructions from your health care provider about hydration, which may include:  Up to 2 hours before the procedure - you may continue to drink clear liquids, such as water, clear fruit juice, black coffee, and plain tea.  Eating and drinking restrictions Follow instructions from your health care provider about eating and drinking, which may include:  8 hours before the procedure - stop eating heavy meals or foods such as meat, fried foods, or fatty foods.  6 hours before the procedure - stop eating light meals or foods, such as toast or cereal.  6 hours before the procedure - stop drinking milk or drinks that contain milk.  2 hours before the procedure - stop drinking clear liquids.  Medicines  Ask your health care provider about: ? Changing or stopping your regular medicines. This is especially important if you are taking diabetes medicines or blood thinners. ? Taking medicines such as aspirin and ibuprofen. These medicines can thin your blood. Do not take these medicines before your procedure if your health care provider instructs you not to.  You may be given antibiotic medicine to help prevent infection. General instructions  You may have a urine sample taken to check for infection.  Plan to have someone take you home from the hospital or  clinic. What happens during the procedure?  To reduce your risk of infection: ? Your health care team will wash or sanitize their hands. ? Your skin will be washed with soap.  An IV tube will be inserted into one of your veins.  You will be given one of the following: ? A medicine to help you relax (sedative). ? A medicine to make you fall asleep (general anesthetic). ? A medicine that is injected into your spine to numb the area below and slightly above the injection site (spinal anesthetic).  To lower your risk of infection, you may be given an antibiotic medicine by an injection or  through the IV tube.  The opening from which you urinate (urethra) will be cleaned with a germ-killing solution.  The ureteroscope will be passed through your urethra into your bladder.  A salt-water solution will flow through the ureteroscope to fill your bladder. This will help the health care provider see the openings of your ureters more clearly.  Then, the ureteroscope will be passed into your ureter. ? If a growth is found, a piece of it may be removed so it can be examined under a microscope (biopsy). ? If a stone is found, it may be removed through the ureteroscope, or the stone may be broken up using a laser, shock waves, or electrical energy. ? In some cases, if the ureter is too small, a tube may be inserted that keeps the ureter open (ureteral stent). The stent may be left in place for 1 or 2 weeks to keep the ureter open, and then the ureteroscopy procedure will be performed.  The scope will be removed, and your bladder will be emptied. The procedure may vary among health care providers and hospitals. What happens after the procedure?  Your blood pressure, heart rate, breathing rate, and blood oxygen level will be monitored until the medicines you were given have worn off.  You may be asked to urinate.  Donot drive for 24 hours if you were given a sedative. This information is not  intended to replace advice given to you by your health care provider. Make sure you discuss any questions you have with your health care provider. Document Released: 06/06/2013 Document Revised: 03/17/2016 Document Reviewed: 03/13/2016 Elsevier Interactive Patient Education  [employer].

## 2017-11-10 ENCOUNTER — Encounter (HOSPITAL_COMMUNITY): Payer: Self-pay

## 2017-11-10 ENCOUNTER — Encounter (HOSPITAL_COMMUNITY)
Admission: RE | Admit: 2017-11-10 | Discharge: 2017-11-10 | Disposition: A | Discharge status: Home / Self Care | Payer: Medicare Other | Source: Ambulatory Visit | Attending: Urology | Admitting: Urology

## 2017-11-10 DIAGNOSIS — Z79891 Long term (current) use of opiate analgesic: Secondary | ICD-10-CM | POA: Diagnosis not present

## 2017-11-10 DIAGNOSIS — Z79899 Other long term (current) drug therapy: Secondary | ICD-10-CM | POA: Diagnosis not present

## 2017-11-10 DIAGNOSIS — Z955 Presence of coronary angioplasty implant and graft: Secondary | ICD-10-CM | POA: Diagnosis not present

## 2017-11-10 DIAGNOSIS — I2510 Atherosclerotic heart disease of native coronary artery without angina pectoris: Secondary | ICD-10-CM | POA: Diagnosis not present

## 2017-11-10 DIAGNOSIS — I119 Hypertensive heart disease without heart failure: Secondary | ICD-10-CM | POA: Diagnosis not present

## 2017-11-10 DIAGNOSIS — Z7982 Long term (current) use of aspirin: Secondary | ICD-10-CM | POA: Diagnosis not present

## 2017-11-10 DIAGNOSIS — K219 Gastro-esophageal reflux disease without esophagitis: Secondary | ICD-10-CM | POA: Diagnosis not present

## 2017-11-10 DIAGNOSIS — N201 Calculus of ureter: Secondary | ICD-10-CM | POA: Diagnosis present

## 2017-11-10 DIAGNOSIS — Z951 Presence of aortocoronary bypass graft: Secondary | ICD-10-CM | POA: Diagnosis not present

## 2017-11-10 LAB — BASIC METABOLIC PANEL
Anion gap: 7 (ref 5–15)
BUN: 17 mg/dL (ref 6–20)
CO2: 28 mmol/L (ref 22–32)
CREATININE: 0.74 mg/dL (ref 0.61–1.24)
Calcium: 9.1 mg/dL (ref 8.9–10.3)
Chloride: 104 mmol/L (ref 101–111)
GFR calc Af Amer: >60 mL/min (ref >60)
Glucose, Bld: 129 mg/dL — ABNORMAL HIGH (ref 65–99)
Potassium: 4.4 mmol/L (ref 3.5–5.1)
SODIUM: 139 mmol/L (ref 135–145)

## 2017-11-10 LAB — CBC WITH DIFFERENTIAL/PLATELET
Basophils Absolute: 0 10*3/uL (ref 0.0–0.1)
Basophils Relative: 0 %
EOS ABS: 0.2 10*3/uL (ref 0.0–0.7)
Eosinophils Relative: 3 %
HCT: 40.2 % (ref 39.0–52.0)
Hemoglobin: 12.4 g/dL — ABNORMAL LOW (ref 13.0–17.0)
LYMPHS ABS: 1.4 10*3/uL (ref 0.7–4.0)
Lymphocytes Relative: 29 %
MCH: 29.2 pg (ref 26.0–34.0)
MCHC: 30.8 g/dL (ref 30.0–36.0)
MCV: 94.8 fL (ref 78.0–100.0)
MONOS PCT: 10 %
Monocytes Absolute: 0.5 10*3/uL (ref 0.1–1.0)
Neutro Abs: 2.8 10*3/uL (ref 1.7–7.7)
Neutrophils Relative %: 58 %
Platelets: 138 10*3/uL — ABNORMAL LOW (ref 150–400)
RBC: 4.24 MIL/uL (ref 4.22–5.81)
RDW: 14.1 % (ref 11.5–15.5)
WBC: 4.9 10*3/uL (ref 4.0–10.5)

## 2017-11-12 ENCOUNTER — Ambulatory Visit (HOSPITAL_COMMUNITY)
Admission: RE | Admit: 2017-11-12 | Discharge: 2017-11-12 | Disposition: A | Discharge status: Home / Self Care | Payer: Medicare Other | Source: Ambulatory Visit | Attending: Urology | Admitting: Urology

## 2017-11-12 ENCOUNTER — Other Ambulatory Visit: Payer: Self-pay

## 2017-11-12 ENCOUNTER — Ambulatory Visit (HOSPITAL_COMMUNITY): Payer: Medicare Other | Admitting: Anesthesiology

## 2017-11-12 ENCOUNTER — Ambulatory Visit (HOSPITAL_COMMUNITY): Payer: Medicare Other

## 2017-11-12 ENCOUNTER — Encounter (HOSPITAL_COMMUNITY)
Admission: RE | Disposition: A | Discharge status: Home / Self Care | Payer: Self-pay | Source: Ambulatory Visit | Attending: Urology

## 2017-11-12 ENCOUNTER — Encounter (HOSPITAL_COMMUNITY): Payer: Self-pay | Admitting: *Deleted

## 2017-11-12 DIAGNOSIS — N201 Calculus of ureter: Secondary | ICD-10-CM | POA: Diagnosis not present

## 2017-11-12 DIAGNOSIS — I119 Hypertensive heart disease without heart failure: Secondary | ICD-10-CM | POA: Insufficient documentation

## 2017-11-12 DIAGNOSIS — Z79899 Other long term (current) drug therapy: Secondary | ICD-10-CM | POA: Insufficient documentation

## 2017-11-12 DIAGNOSIS — I2510 Atherosclerotic heart disease of native coronary artery without angina pectoris: Secondary | ICD-10-CM | POA: Diagnosis not present

## 2017-11-12 DIAGNOSIS — Z951 Presence of aortocoronary bypass graft: Secondary | ICD-10-CM | POA: Insufficient documentation

## 2017-11-12 DIAGNOSIS — K219 Gastro-esophageal reflux disease without esophagitis: Secondary | ICD-10-CM | POA: Insufficient documentation

## 2017-11-12 DIAGNOSIS — Z7982 Long term (current) use of aspirin: Secondary | ICD-10-CM | POA: Insufficient documentation

## 2017-11-12 DIAGNOSIS — Z955 Presence of coronary angioplasty implant and graft: Secondary | ICD-10-CM | POA: Insufficient documentation

## 2017-11-12 DIAGNOSIS — Z79891 Long term (current) use of opiate analgesic: Secondary | ICD-10-CM | POA: Insufficient documentation

## 2017-11-12 HISTORY — PX: CYSTOSCOPY/URETEROSCOPY/HOLMIUM LASER/STENT PLACEMENT: SHX6546

## 2017-11-12 SURGERY — CYSTOSCOPY/URETEROSCOPY/HOLMIUM LASER/STENT PLACEMENT
Anesthesia: General | Laterality: Right

## 2017-11-12 MED ORDER — TRAMADOL HCL 50 MG PO TABS: 50.0000 mg | ORAL_TABLET | Freq: Four times a day (QID) | ORAL | 0 refills | Status: AC | PRN

## 2017-11-12 MED ORDER — PROPOFOL 10 MG/ML IV BOLUS
INTRAVENOUS | Status: DC | PRN
  Administered 2017-11-12: 150 mg via INTRAVENOUS

## 2017-11-12 MED ORDER — ACETAMINOPHEN 650 MG RE SUPP
650.0000 mg | RECTAL | Status: DC | PRN
  Filled 2017-11-12: qty 1

## 2017-11-12 MED ORDER — FENTANYL CITRATE (PF) 100 MCG/2ML IJ SOLN
INTRAMUSCULAR | Status: DC | PRN
  Administered 2017-11-12: 100 ug via INTRAVENOUS

## 2017-11-12 MED ORDER — SODIUM CHLORIDE 0.9 % IV SOLN: 250.0000 mL | INTRAVENOUS | Status: DC | PRN

## 2017-11-12 MED ORDER — FENTANYL CITRATE (PF) 100 MCG/2ML IJ SOLN
INTRAMUSCULAR | Status: AC
  Filled 2017-11-12: qty 2

## 2017-11-12 MED ORDER — ONDANSETRON HCL 4 MG/2ML IJ SOLN
INTRAMUSCULAR | Status: DC | PRN
  Administered 2017-11-12: 4 mg via INTRAVENOUS

## 2017-11-12 MED ORDER — LACTATED RINGERS IV SOLN
INTRAVENOUS | Status: DC
  Administered 2017-11-12: 11:00:00 via INTRAVENOUS

## 2017-11-12 MED ORDER — DIATRIZOATE MEGLUMINE 30 % UR SOLN
URETHRAL | Status: AC
  Filled 2017-11-12: qty 100

## 2017-11-12 MED ORDER — MORPHINE SULFATE (PF) 2 MG/ML IV SOLN: 1.0000 mg | INTRAVENOUS | Status: DC | PRN

## 2017-11-12 MED ORDER — SODIUM CHLORIDE 0.9% FLUSH: 3.0000 mL | Freq: Two times a day (BID) | INTRAVENOUS | Status: DC

## 2017-11-12 MED ORDER — CEFAZOLIN SODIUM-DEXTROSE 2-4 GM/100ML-% IV SOLN
INTRAVENOUS | Status: AC
  Filled 2017-11-12: qty 100

## 2017-11-12 MED ORDER — STERILE WATER FOR IRRIGATION IR SOLN
Status: DC | PRN
  Administered 2017-11-12: 500 mL
  Administered 2017-11-12: 3000 mL

## 2017-11-12 MED ORDER — SODIUM CHLORIDE 0.9% FLUSH: 3.0000 mL | INTRAVENOUS | Status: DC | PRN

## 2017-11-12 MED ORDER — OXYCODONE HCL 5 MG PO TABS
5.0000 mg | ORAL_TABLET | ORAL | Status: DC | PRN
  Administered 2017-11-12: 5 mg via ORAL
  Filled 2017-11-12: qty 1

## 2017-11-12 MED ORDER — ONDANSETRON HCL 4 MG/2ML IJ SOLN
INTRAMUSCULAR | Status: AC
  Filled 2017-11-12: qty 2

## 2017-11-12 MED ORDER — CEFAZOLIN SODIUM-DEXTROSE 2-4 GM/100ML-% IV SOLN
2.0000 g | INTRAVENOUS | Status: AC
  Administered 2017-11-12: 2 g via INTRAVENOUS

## 2017-11-12 MED ORDER — PROPOFOL 10 MG/ML IV BOLUS
INTRAVENOUS | Status: AC
  Filled 2017-11-12: qty 20

## 2017-11-12 MED ORDER — FENTANYL CITRATE (PF) 100 MCG/2ML IJ SOLN
25.0000 ug | INTRAMUSCULAR | Status: DC | PRN
  Administered 2017-11-12: 50 ug via INTRAVENOUS
  Filled 2017-11-12: qty 2

## 2017-11-12 MED ORDER — SODIUM CHLORIDE 0.9 % IR SOLN
Status: DC | PRN
  Administered 2017-11-12: 3000 mL

## 2017-11-12 MED ORDER — ACETAMINOPHEN 325 MG PO TABS: 650.0000 mg | ORAL_TABLET | ORAL | Status: DC | PRN

## 2017-11-12 SURGICAL SUPPLY — 21 items
BAG DRAIN URO TABLE W/ADPT NS (DRAPE) ×2 IMPLANT
BAG DRN 8 ADPR NS SKTRN CSTL (DRAPE) ×1
CATH URET 5FR 28IN OPEN ENDED (CATHETERS) ×1 IMPLANT
CLOTH BEACON ORANGE TIMEOUT ST (SAFETY) ×4 IMPLANT
EXTRACTOR STONE NITINOL NGAGE (UROLOGICAL SUPPLIES) ×2 IMPLANT
FIBER LASER FLEXIVA 365 (UROLOGICAL SUPPLIES) ×2 IMPLANT
GLOVE SURG SS PI 8.0 STRL IVOR (GLOVE) ×2 IMPLANT
GOWN STRL REUS W/TWL LRG LVL3 (GOWN DISPOSABLE) ×2 IMPLANT
GOWN STRL REUS W/TWL XL LVL3 (GOWN DISPOSABLE) ×2 IMPLANT
GUIDEWIRE STR DUAL SENSOR (WIRE) ×1 IMPLANT
IV NS IRRIG 3000ML ARTHROMATIC (IV SOLUTION) ×3 IMPLANT
KIT TURNOVER CYSTO (KITS) ×2 IMPLANT
MANIFOLD NEPTUNE II (INSTRUMENTS) ×2 IMPLANT
PACK CYSTO (CUSTOM PROCEDURE TRAY) ×2 IMPLANT
PAD ARMBOARD 7.5X6 YLW CONV (MISCELLANEOUS) ×2 IMPLANT
SHEATH URET ACCESS 12FR/35CM (UROLOGICAL SUPPLIES) ×1 IMPLANT
SHEATH URETERAL 12FRX35CM (MISCELLANEOUS) ×1 IMPLANT
STENT URET 6FRX26 CONTOUR (STENTS) ×2 IMPLANT
TOWEL OR 17X26 4PK STRL BLUE (TOWEL DISPOSABLE) ×3 IMPLANT
WATER STERILE IRR 3000ML UROMA (IV SOLUTION) ×1 IMPLANT
WATER STERILE IRR 500ML POUR (IV SOLUTION) ×1 IMPLANT

## 2017-11-12 NOTE — Anesthesia Postprocedure Evaluation (Signed)
Anesthesia Post Note  Patient: MATTHEO SWINDLE  Procedure(s) Performed: CYSTOSCOPY/RIGHT RETROGRADE PYELOGRAM/RIGHT URETEROSCOPY/HOLMIUM LASER LITHOTRIPSY RIGHT URETERAL CALCULUS/RIGHT URETERAL STENT PLACEMENT (Right )  Patient location during evaluation: PACU Anesthesia Type: General Level of consciousness: awake, awake and alert and oriented Pain management: pain level controlled Vital Signs Assessment: post-procedure vital signs reviewed and stable Respiratory status: spontaneous breathing, nonlabored ventilation and patient connected to face mask oxygen Cardiovascular status: stable Postop Assessment: no apparent nausea or vomiting Anesthetic complications: no     Last Vitals:  Vitals:   11/12/17 1029 11/12/17 1354  BP: (!) 143/62 132/89  Pulse: 67   Resp: 16   Temp: 36.9 C 36.6 C  SpO2: 95%     Last Pain:  Vitals:   11/12/17 1354  TempSrc:   PainSc: 0-No pain                 Despina Boan

## 2017-11-12 NOTE — Discharge Instructions (Addendum)
Ureteral Stent Implantation, Care After Refer to this sheet in the next few weeks. These instructions provide you with information about caring for yourself after your procedure. Your health care provider may also give you more specific instructions. Your treatment has been planned according to current medical practices, but problems sometimes occur. Call your health care provider if you have any problems or questions after your procedure. What can I expect after the procedure? After the procedure, it is common to have:  Nausea.  Mild pain when you urinate. You may feel this pain in your lower back or lower abdomen. Pain should stop within a few minutes after you urinate. This may last for up to 1 week.  A small amount of blood in your urine for several days.  Follow these instructions at home:  Medicines  Take over-the-counter and prescription medicines only as told by your health care provider.  If you were prescribed an antibiotic medicine, take it as told by your health care provider. Do not stop taking the antibiotic even if you start to feel better.  Do not drive for 24 hours if you received a sedative.  Do not drive or operate heavy machinery while taking prescription pain medicines. Activity  Return to your normal activities as told by your health care provider. Ask your health care provider what activities are safe for you.  Do not lift anything that is heavier than 10 lb (4.5 kg). Follow this limit for 1 week after your procedure, or for as long as told by your health care provider. General instructions  Watch for any blood in your urine. Call your health care provider if the amount of blood in your urine increases.  If you have a catheter: ? Follow instructions from your health care provider about taking care of your catheter and collection bag. ? Do not take baths, swim, or use a hot tub until your health care provider approves.  Drink enough fluid to keep your urine  clear or pale yellow.  Keep all follow-up visits as told by your health care provider. This is important. Contact a health care provider if:  You have pain that gets worse or does not get better with medicine, especially pain when you urinate.  You have difficulty urinating.  You feel nauseous or you vomit repeatedly during a period of more than 2 days after the procedure. Get help right away if:  Your urine is dark red or has blood clots in it.  You are leaking urine (have incontinence).  The end of the stent comes out of your urethra.  You cannot urinate.  You have sudden, sharp, or severe pain in your abdomen or lower back.  You have a fever.  You may remove the stent by pulling the attached string on Monday morning.  If you don't feel comfortable doing that you can come to the office on Tuesday to have it done.  Please call first.   This information is not intended to replace advice given to you by your health care provider. Make sure you discuss any questions you have with your health care provider. Document Released: 02/01/2013 Document Revised: 11/07/2015 Document Reviewed: 12/14/2014 Elsevier Interactive Patient Education  2018 [employer] Anesthesia, Adult, Care After These instructions provide you with information about caring for yourself after your procedure. Your health care provider may also give you more specific instructions. Your treatment has been planned according to current medical practices, but problems sometimes occur. Call your health  care provider if you have any problems or questions after your procedure. What can I expect after the procedure? After the procedure, it is common to have:  Vomiting.  A sore throat.  Mental slowness.  It is common to feel:  Nauseous.  Cold or shivery.  Sleepy.  Tired.  Sore or achy, even in parts of your body where you did not have surgery.  Follow these instructions at home: For at least 24  hours after the procedure:  Do not: ? Participate in activities where you could fall or become injured. ? Drive. ? Use heavy machinery. ? Drink alcohol. ? Take sleeping pills or medicines that cause drowsiness. ? Make important decisions or sign legal documents. ? Take care of children on your own.  Rest. Eating and drinking  If you vomit, drink water, juice, or soup when you can drink without vomiting.  Drink enough fluid to keep your urine clear or pale yellow.  Make sure you have little or no nausea before eating solid foods.  Follow the diet recommended by your health care provider. General instructions  Have a responsible adult stay with you until you are awake and alert.  Return to your normal activities as told by your health care provider. Ask your health care provider what activities are safe for you.  Take over-the-counter and prescription medicines only as told by your health care provider.  If you smoke, do not smoke without supervision.  Keep all follow-up visits as told by your health care provider. This is important. Contact a health care provider if:  You continue to have nausea or vomiting at home, and medicines are not helpful.  You cannot drink fluids or start eating again.  You cannot urinate after 8-12 hours.  You develop a skin rash.  You have fever.  You have increasing redness at the site of your procedure. Get help right away if:  You have difficulty breathing.  You have chest pain.  You have unexpected bleeding.  You feel that you are having a life-threatening or urgent problem. This information is not intended to replace advice given to you by your health care provider. Make sure you discuss any questions you have with your health care provider. Document Released: 09/07/2000 Document Revised: 11/04/2015 Document Reviewed: 05/16/2015 Elsevier Interactive Patient Education  [employer].

## 2017-11-12 NOTE — Anesthesia Preprocedure Evaluation (Signed)
Anesthesia Evaluation  Patient identified by MRN, date of birth, ID band Patient awake    Reviewed: Allergy & Precautions, NPO status , Patient's Chart, lab work & pertinent test results  Airway Mallampati: I  TM Distance: >3 FB Neck ROM: Full    Dental no notable dental hx.    Pulmonary neg pulmonary ROS, shortness of breath,    Pulmonary exam normal breath sounds clear to auscultation       Cardiovascular Exercise Tolerance: Good hypertension, Pt. 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 -last 2016- states great ET >60miles  Denies any recent CP or DOE Last Echo EF ~60%   Neuro/Psych  Headaches, negative neurological ROS  negative psych ROS   GI/Hepatic negative GI ROS, Neg liver ROS, GERD  Medicated,H/o PUD  S/p Nissen  now only uses PRN omeprazole    Endo/Other  negative endocrine ROS  Renal/GU Renal diseasenegative Renal ROS  negative genitourinary   Musculoskeletal negative musculoskeletal ROS (+) Arthritis , Osteoarthritis,    Abdominal   Peds negative pediatric ROS (+)  Hematology negative hematology ROS (+)   Anesthesia Other Findings   Reproductive/Obstetrics negative OB ROS                             Anesthesia Physical Anesthesia Plan  ASA: III  Anesthesia Plan: General   Post-op Pain Management:    Induction: Intravenous  PONV Risk Score and Plan:   Airway Management Planned:   Additional Equipment:   Intra-op Plan:   Post-operative Plan: Extubation in OR  Informed Consent: I have reviewed the patients History and Physical, chart, labs and discussed the procedure including the risks, benefits and alternatives for the proposed anesthesia with the patient or authorized representative who has indicated his/her understanding and acceptance.     Plan Discussed with:  CRNA  Anesthesia Plan Comments: (LMA vs GETA)        Anesthesia Quick Evaluation

## 2017-11-12 NOTE — Transfer of Care (Signed)
Immediate Anesthesia Transfer of Care Note  Patient: Jorge Bishop  Procedure(s) Performed: CYSTOSCOPY/RIGHT RETROGRADE PYELOGRAM/RIGHT URETEROSCOPY/HOLMIUM LASER LITHOTRIPSY RIGHT URETERAL CALCULUS/RIGHT URETERAL STENT PLACEMENT (Right )  Patient Location: PACU  Anesthesia Type:General  Level of Consciousness: awake, alert  and oriented  Airway & Oxygen Therapy: Patient Spontanous Breathing and Patient connected to face mask oxygen  Post-op Assessment: Report given to RN and Post -op Vital signs reviewed and stable  Post vital signs: Reviewed and stable  Last Vitals:  Vitals Value Taken Time  BP    Temp    Pulse    Resp    SpO2      Last Pain:  Vitals:   11/12/17 1029  TempSrc: Oral  PainSc:          Complications: No apparent anesthesia complications

## 2017-11-12 NOTE — Anesthesia Procedure Notes (Signed)
Procedure Name: LMA Insertion Date/Time: 11/12/2017 1:06 PM Performed by: Jonna Munro, CRNA Pre-anesthesia Checklist: Patient identified, Emergency Drugs available, Suction available, Patient being monitored and Timeout performed Patient Re-evaluated:Patient Re-evaluated prior to induction Oxygen Delivery Method: Circle system utilized Preoxygenation: Pre-oxygenation with 100% oxygen Induction Type: IV induction LMA: LMA inserted LMA Size: 5.0 Number of attempts: 1 Placement Confirmation: positive ETCO2 and breath sounds checked- equal and bilateral Tube secured with: Tape Dental Injury: Teeth and Oropharynx as per pre-operative assessment

## 2017-11-12 NOTE — H&P (Signed)
CC: I have ureteral stone.  HPI: Jorge Bishop is a 82 year-old male established patient who is here for ureteral stone.  The problem is on the right side.   Mr. Trauger returns today in f/u from his ESWL on 09/13/17. He hasn't passed much stone material since his last visit. A renal US on 5/15 show a small perinephric hematoma but no hydro. KUB shows 2 small renal fragments and a 6.71m right distal fragment. He has no pain or hematuria.      ALLERGIES: Bactrim Clarithromycin Codeine Erythromycin Sulfa Sulfa-methoxazole    MEDICATIONS: Aspirin 81 mg tablet,chewable  Omeprazole 20 mg capsule,delayed release  Tamsulosin Hcl 0.4 mg capsule  Atorvastatin Calcium 20 mg tablet  Calcium Polycarbophil 625 mg tablet  Carvedilol 6.25 mg tablet  Ezetimibe 10 mg tablet  Hydromorphone Hcl 4 mg tablet  Loperamide 2 mg tablet  Niacin 500 mg capsule  Ondansetron Hcl 4 mg tablet  Vitamin D3 5,000 unit/ml drops     GU PSH: ESWL - 09/13/2017    NON-GU PSH: CABG (coronary artery bypass grafting) - about 1998 Cardiac Stent Placement - about 2016    GU PMH: Ureteral calculus, He has a 6.5 x 411mRUVJ fragment but no symptoms and he has passed a lot of stone. I will have him return in 4-6 weeks with a KUB and renal [place]- 10/15/2017      PMH Notes: Stenosis Lumbar   NON-GU PMH: GERD Heart disease, unspecified    FAMILY HISTORY: heart - Father, Son   SOCIAL HISTORY: Marital Status: Married Current Smoking Status: Patient has never smoked.   Tobacco Use Assessment Completed: Used Tobacco in last 30 days? Has never drank.  Drinks 2 caffeinated drinks per day.    REVIEW OF SYSTEMS:    GU Review Male:   Patient reports burning/ pain with urination, get up at night to urinate, and stream starts and stops. Patient denies frequent urination, hard to postpone urination, leakage of urine, trouble starting your stream, have to strain to urinate , erection problems, and penile pain.  Gastrointestinal  (Upper):   Patient denies nausea, vomiting, and indigestion/ heartburn.  Gastrointestinal (Lower):   Patient denies diarrhea and constipation.  Constitutional:   Patient reports weight loss. Patient denies fever, night sweats, and fatigue.  Skin:   Patient denies skin rash/ lesion and itching.  Eyes:   Patient denies blurred vision and double vision.  Ears/ Nose/ Throat:   Patient denies sore throat and sinus problems.  Hematologic/Lymphatic:   Patient reports easy bruising. Patient denies swollen glands.  Cardiovascular:   Patient denies leg swelling and chest pains.  Respiratory:   Patient denies cough and shortness of breath.  Endocrine:   Patient denies excessive thirst.  Musculoskeletal:   Patient reports back pain. Patient denies joint pain.  Neurological:   Patient reports dizziness. Patient denies headaches.  Psychologic:   Patient denies depression and anxiety.   VITAL SIGNS:      11/05/2017 08:51 AM  BP 123/75 mmHg  Pulse 73 /min  Temperature 98.3 F / 36.8 C   MULTI-SYSTEM PHYSICAL EXAMINATION:    Constitutional: Well-nourished. No physical deformities. Normally developed. Good grooming.  Respiratory: No labored breathing, no use of accessory muscles. Normal breath sounds.  Cardiovascular: Regular rate and rhythm. No murmur, no gallop.      PAST DATA REVIEWED:  Source Of History:  Patient  Urine Test Review:   Urinalysis  X-Ray Review: KUB: Reviewed Films. Discussed With Patient. 6.5 x  40m right UVJ stone. Small right renal fragments.  Renal Ultrasound: Reviewed Films. Reviewed Report. Discussed With Patient. no hydro. small hematoma.     PROCEDURES: None   ASSESSMENT:      ICD-10 Details  1 GU:   Ureteral calculus - N20.1 He has a large distal fragment and I discussed MET, URS and ESWL. He would like to have URS. I have reviewed the risks of ureteroscopy including bleeding, infection, ureteral injury, need for a stent or secondary procedures, thrombotic events and  anesthetic complications. I will try to get it on at [facility] next week.    PLAN:           Schedule Return Visit/Planned Activity: 1 Week - Schedule Surgery          Document Letter(s):  Created for Patient: Clinical Summary         Next Appointment:      Next Appointment: 11/26/2017 01:30 PM    Appointment Type: Office Visit Established Patient    Location: [facility] [address]   Provider: [facility]   Reason for Visit: 4-6 wk followup w/ KUB' \T' \ Renal [place]

## 2017-11-15 NOTE — Op Note (Signed)
Procedure: 1.  Cystoscopy with right retrograde pyelogram and interpretation. 2.  Right ureteroscopy with holmium laser application, stone manipulation and insertion of right double-J stent.  Preop diagnosis: Right distal ureteral stone.  Postop diagnosis: Same.  Surgeon: Dr. [name].  Anesthesia: General.  Drain: 6 French by 26 cm contour double-J stent with tether.  Specimen: Stone fragments.  EBL: Minimal.  Complications: None.  Indications: Mr. Kumari is an 82 year old white male who had previously undergone right renal lithotripsy but has a persistent right distal ureteral fragment approximately 6 mm in size.  He is elected ureteroscopy.  Procedure: He was given Ancef and taken to the operating room.  General anesthetic was induced.  He was placed in the lithotomy position and fitted with PAS hose.  His perineum and genitalia were prepped with Betadine solution.  He was draped in usual sterile fashion.  Cystoscopy was performed using a 23 [place] scope and 30 degree lens.  Examination revealed a normal urethra.  The external sphincter is intact.  The prostatic urethra was approximately 3 to 4 cm in length with bilobar hyperplasia with some degree of obstruction.  The bladder wall had mild trabeculation with normal mucosa.  Ureteral orifices are unremarkable.  The right ureteral orifice was cannulated with a 5 French opening catheter and contrast was instilled.  The right retrograde pyelogram  demonstrated a filling defect approximately 3 to 4 cm proximal to the meatus consistent with the stone seen on KUB.  The remaining collecting system was normal with minimal dilation.  A guidewire was then passed to the kidney and the ureter was dilated with a 35 cm 12 French access sheath inner core.  The dual-lumen semirigid ureteroscope was then passed to the level of the stone.  The stone was then engaged with a 365 m holmium laser fiber set on 1 W and 20 Hz.  The stone was broken and the  manageable fragments which were then removed using an engage basket.  Once all stone fragments were removed, the ureteroscope was removed and the cystoscope was reinserted over the wire.  A 6 French 26 cm contour double-J stent with tether was then passed over the wire to the kidney under fluoroscopic guidance.  The wire was removed leaving a good coil in the kidney and a good coil in the bladder.  The bladder was drained and the stent string was left exiting the urethra after removal of the scope.  The string was secured to the patient's.  Penis he was then taken down from lithotomy position, his anesthetic was reversed and he was moved to recovery in stable condition.  There were no complications.  The stone fragments were given to the patient's family.

## 2017-11-19 ENCOUNTER — Other Ambulatory Visit (HOSPITAL_COMMUNITY)
Admission: RE | Admit: 2017-11-19 | Discharge: 2017-11-19 | Disposition: A | Discharge status: Home / Self Care | Payer: Medicare Other | Source: Other Acute Inpatient Hospital | Attending: Urology | Admitting: Urology

## 2017-11-19 DIAGNOSIS — N201 Calculus of ureter: Secondary | ICD-10-CM | POA: Diagnosis present

## 2017-11-20 LAB — URINE CULTURE: CULTURE: NO GROWTH

## 2017-11-30 ENCOUNTER — Telehealth: Payer: Self-pay | Admitting: Cardiovascular Disease

## 2017-11-30 NOTE — Telephone Encounter (Signed)
I will FYI Dr.Koneswaran 

## 2017-11-30 NOTE — Telephone Encounter (Signed)
Pt wanted to make Dr. Bronson Ing aware that he's needing surgical clearance for spinal surgery in August, Dr. Georga Kaufmann will be doing the surgery and should sent a clearance request.

## 2017-12-01 NOTE — Telephone Encounter (Signed)
Ok. He'll need a clinical evaluation in the office prior to surgery. Have him see me or [facility].

## 2017-12-02 NOTE — Telephone Encounter (Signed)
Apt made for 12/23/17 at 2 pm with Bernerd Pho PA-C

## 2017-12-03 ENCOUNTER — Ambulatory Visit (INDEPENDENT_AMBULATORY_CARE_PROVIDER_SITE_OTHER): Payer: Medicare Other | Admitting: Urology

## 2017-12-03 ENCOUNTER — Other Ambulatory Visit (HOSPITAL_COMMUNITY)
Admission: RE | Admit: 2017-12-03 | Discharge: 2017-12-03 | Disposition: A | Discharge status: Home / Self Care | Payer: Medicare Other | Source: Ambulatory Visit | Attending: Urology | Admitting: Urology

## 2017-12-03 DIAGNOSIS — R81 Glycosuria: Secondary | ICD-10-CM

## 2017-12-03 DIAGNOSIS — N201 Calculus of ureter: Secondary | ICD-10-CM

## 2017-12-03 DIAGNOSIS — N200 Calculus of kidney: Secondary | ICD-10-CM | POA: Diagnosis present

## 2017-12-06 ENCOUNTER — Other Ambulatory Visit (HOSPITAL_COMMUNITY)
Admit: 2017-12-06 | Discharge: 2017-12-06 | Disposition: A | Discharge status: Home / Self Care | Payer: Medicare Other | Source: Ambulatory Visit | Attending: Urology | Admitting: Urology

## 2017-12-06 DIAGNOSIS — N200 Calculus of kidney: Secondary | ICD-10-CM | POA: Insufficient documentation

## 2017-12-09 LAB — STONE ANALYSIS
Ca Oxalate,Dihydrate: 2 %
Ca Oxalate,Monohydr.: 93 %
Ca phos cry stone ql IR: 5 %
Stone Weight KSTONE: 82.6 mg

## 2017-12-22 NOTE — Progress Notes (Signed)
Cardiology Office Note    Date:  12/23/2017   ID:  Jorge Bishop, DOB Dec 18, 1935, MRN [id number]  PCP:  [name], MD  Cardiologist: Kate Sable, MD    Chief Complaint  Patient presents with  . Follow-up    Cardiac Clearance    History of Present Illness:    Jorge Bishop is a 82 y.o. male with past medical history of CAD (s/p CABG in 1998, DES to [place] in 2004 and 2006 with subsequent restenosis by cath in 2009 and medical management recommended, low-risk NST in 11/2014), HTN, HLD, and DJD who presents to the office today for preoperative cardiac clearance for upcoming spinal surgery.   He was last examined by Dr. Bronson Ing in 01/2017 and denied any recent chest pain or palpitations at that time. Had baseline dyspnea on exertion but denied any acute changes in his symptoms. Was continued on his current medication regimen including ASA, BB, and statin therapy.   In the interim, he was admitted to [facility] in 08/2017 for evaluation of right-flank pain and found to have an 11 mm kidney stone. Urology was consulted and he underwent lithotripsy as an outpatient. An echocardiogram was performed prior to the procedure and showed a preserved EF of 55-60%, Grade 1 DD, and no regional WMA. No immediate complications were noted in the post-operative setting.   In talking with the patient today, he reports being active at baseline and walks over 3 miles each day with his wife. Denies any anginal symptoms when performing these activities. No recent chest pain, dyspnea on exertion, orthopnea, PND, lower extremity edema, palpitations, lightheadedness, dizziness, or presyncope.  Does report occasional dyspnea when climbing several flights of stairs but says this has been a chronic issue over the past several years and denies any recent change in his symptoms.  He does not check his blood pressure regularly but it is well controlled at 128/76 during today's visit.  Volunteers weekly  at cardiac rehab and says he has done so for almost 20 years.    Past Medical History:  Diagnosis Date  . Arteriosclerotic cardiovascular disease (ASCVD)    Coronary artery bypass graft surgery in 12/98; DES to the RCA SVG in 07/2002; restenosis of the saphenous vein graft stent and renal intervention in 2006; 70% first diagonal present; calf in 11/2007-total obstruction of the saphenous vein graft to the first marginal  . Benign prostatic hypertrophy   . Cerebrovascular disease    60% bilateral internal carotid artery stenosis in 2012  . Coronary artery disease    4 stents  . Diverticulosis   . DJD (degenerative joint disease)   . Erectile dysfunction   . GERD (gastroesophageal reflux disease)   . History of blood transfusion   . History of kidney stones   . Hyperlipidemia    Lipid profile in 12/2009:104, 91, 36, 50  . Migraine headache   . Right bundle branch block   . Shortness of breath   . Thrombocytopenia (HCC)    platelets of 104 in 03/2009  . Upper gastrointestinal hemorrhage 50/53    Helicobacter pylori positive; subsequent laparoscopic fundoplication-2009    Past Surgical History:  Procedure Laterality Date  . CARDIAC CATHETERIZATION     4 cardiac stents  . CATARACT EXTRACTION W/PHACO Right 07/25/2012   Procedure: CATARACT EXTRACTION PHACO AND INTRAOCULAR LENS PLACEMENT (IOC);  Surgeon: Tonny Branch, MD;  Location: [facility];  Service: Ophthalmology;  Laterality: Right;  CDE:23.31  . CATARACT EXTRACTION W/PHACO Left 08/18/2012  Procedure: CATARACT EXTRACTION PHACO AND INTRAOCULAR LENS PLACEMENT (IOC);  Surgeon: Tonny Branch, MD;  Location: [facility];  Service: Ophthalmology;  Laterality: Left;  CDE: 16.07  . COLONOSCOPY N/A 08/01/2014   Procedure: COLONOSCOPY;  Surgeon: Rogene Houston, MD;  Location: [facility];  Service: Endoscopy;  Laterality: N/A;  125 - moved to 10:30 - Ann to notify pt  . CORONARY ARTERY BYPASS GRAFT  1998   . CYSTOSCOPY/URETEROSCOPY/HOLMIUM LASER/STENT  PLACEMENT Right 11/12/2017   Procedure: CYSTOSCOPY/RIGHT RETROGRADE PYELOGRAM/RIGHT URETEROSCOPY/HOLMIUM LASER LITHOTRIPSY RIGHT URETERAL CALCULUS/RIGHT URETERAL STENT PLACEMENT;  Surgeon: [name], MD;  Location: [facility];  Service: Urology;  Laterality: Right;  right ureteral calculus given to patient's wife per Dr. [name]  . EXTRACORPOREAL SHOCK WAVE LITHOTRIPSY Right 09/13/2017   Procedure: RIGHT EXTRACORPOREAL SHOCK WAVE LITHOTRIPSY (ESWL);  Surgeon: Lucas Mallow, MD;  Location: [facility];  Service: Urology;  Laterality: Right;  . HERNIA REPAIR     right inguinal  . LAPAROSCOPIC NISSEN FUNDOPLICATION  8099, 8338    revision in 2009 along with repair of paraesophagel hernia   . PARAESOPHAGEAL HERNIA REPAIR      Current Medications: Outpatient Medications Prior to Visit  Medication Sig Dispense Refill  . aspirin (ASPIRIN LOW DOSE) 81 MG tablet Take 81 mg by mouth daily.      Marland Kitchen atorvastatin (LIPITOR) 20 MG tablet Take 20 mg by mouth at bedtime.      . carvedilol (COREG) 12.5 MG tablet Take 6.25 mg by mouth 2 (two) times daily with a meal.    . Cholecalciferol (VITAMIN D3) 10000 units TABS Take by mouth.    . ezetimibe (ZETIA) 10 MG tablet Take 10 mg by mouth daily.    Marland Kitchen FIBER PO Take 2 capsules by mouth daily as needed (constipation).    Marland Kitchen loperamide (IMODIUM A-D) 2 MG tablet Take 0.5 tablets (1 mg total) by mouth at bedtime. (Patient taking differently: Take 1 mg by mouth as needed for diarrhea or loose stools. ) 1 tablet 0  . loratadine (CLARITIN) 10 MG tablet Take 10 mg by mouth daily as needed for allergies.    . metFORMIN (GLUCOPHAGE) 500 MG tablet     . niacin (NIASPAN) 500 MG CR tablet Take 500 mg by mouth See admin instructions. Take 500 mg in the morning and 1000 mg in the evening    . omeprazole (PRILOSEC) 20 MG capsule Take 20 mg by mouth daily as needed (acid reflux).     . ondansetron (ZOFRAN) 4 MG tablet Take 4 mg by mouth every 8 (eight) hours as needed for nausea or  vomiting.     . promethazine (PHENERGAN) 25 MG tablet Take 25 mg by mouth every 6 (six) hours as needed for nausea or vomiting.    . Tamsulosin HCl (FLOMAX) 0.4 MG CAPS Take 0.4 mg by mouth 2 (two) times daily.     . traMADol (ULTRAM) 50 MG tablet Take 1 tablet (50 mg total) by mouth every 6 (six) hours as needed. 12 tablet 0  . Cholecalciferol (VITAMIN D3) 5000 units CAPS Take 5,000 Units by mouth daily.     No facility-administered medications prior to visit.      Allergies:   Clarithromycin; Codeine; Erythromycin; and Sulfa antibiotics   Social History   Socioeconomic History  . Marital status: Married    Spouse name: Not on file  . Number of children: Not on file  . Years of education: Not on file  . Highest education level:  Not on file  Occupational History  . Occupation: Full time    Employer: RETIRED  Social Needs  . Financial resource strain: Not on file  . Food insecurity:    Worry: Not on file    Inability: Not on file  . Transportation needs:    Medical: Not on file    Non-medical: Not on file  Tobacco Use  . Smoking status: Never Smoker  . Smokeless tobacco: Never Used  Substance and Sexual Activity  . Alcohol use: No    Alcohol/week: 0.0 oz  . Drug use: No  . Sexual activity: Yes    Birth control/protection: None  Lifestyle  . Physical activity:    Days per week: Not on file    Minutes per session: Not on file  . Stress: Not on file  Relationships  . Social connections:    Talks on phone: Not on file    Gets together: Not on file    Attends religious service: Not on file    Active member of club or organization: Not on file    Attends meetings of clubs or organizations: Not on file    Relationship status: Not on file  Other Topics Concern  . Not on file  Social History Narrative  . Not on file     Family History:  The patient's family history includes CAD in his father.   Review of Systems:   Please see the history of present illness.      General:  No chills, fever, night sweats or weight changes. Positive for chronic back pain.  Cardiovascular:  No chest pain, dyspnea on exertion, edema, orthopnea, palpitations, paroxysmal nocturnal dyspnea. Dermatological: No rash, lesions/masses Respiratory: No cough, dyspnea Urologic: No hematuria, dysuria Abdominal:   No nausea, vomiting, diarrhea, bright red blood per rectum, melena, or hematemesis Neurologic:  No visual changes, wkns, changes in mental status. All other systems reviewed and are otherwise negative except as noted above.   Physical Exam:    VS:  BP 128/76   Pulse 66   Ht 6' (1.829 m)   Wt 177 lb (80.3 kg)   SpO2 99%   BMI 24.01 kg/m    General: Well developed, well nourished Caucasian male appearing in no acute distress. Head: Normocephalic, atraumatic, sclera non-icteric, no xanthomas, nares are without discharge.  Neck: No carotid bruits. JVD not elevated.  Lungs: Respirations regular and unlabored, without wheezes or rales.  Heart: Regular rate and rhythm. No S3 or S4.  No murmur, no rubs, or gallops appreciated. Abdomen: Soft, non-tender, non-distended with normoactive bowel sounds. No hepatomegaly. No rebound/guarding. No obvious abdominal masses. Msk:  Strength and tone appear normal for age. No joint deformities or effusions. Extremities: No clubbing or cyanosis. No lower extremity edema.  Distal pedal pulses are 2+ bilaterally. Neuro: Alert and oriented X 3. Moves all extremities spontaneously. No focal deficits noted. Psych:  Responds to questions appropriately with a normal affect. Skin: No rashes or lesions noted  Wt Readings from Last 3 Encounters:  12/23/17 177 lb (80.3 kg)  11/10/17 179 lb (81.2 kg)  09/13/17 173 lb (78.5 kg)     Studies/Labs Reviewed:   EKG:  EKG is not ordered today. EKG from 09/09/2017 is reviewed which shows NSR, HR 73, with known RBBB.   Recent Labs: 09/10/2017: ALT 26 11/10/2017: BUN 17; Creatinine, Ser 0.74;  Hemoglobin 12.4; Platelets 138; Potassium 4.4; Sodium 139   Lipid Panel    Component Value Date/Time   CHOL 104  01/06/2010   TRIG 91 01/06/2010   HDL 36 01/06/2010   LDLCALC 50 01/06/2010    Additional studies/ records that were reviewed today include:   Echocardiogram: 09/10/2017 Study Conclusions  - Left ventricle: The cavity size was normal. Wall thickness was   increased in a pattern of mild LVH. Systolic function was normal.   The estimated ejection fraction was in the range of 55% to 60%.   Wall motion was normal; there were no regional wall motion   abnormalities. Doppler parameters are consistent with abnormal   left ventricular relaxation (grade 1 diastolic dysfunction).   Doppler parameters are consistent with high ventricular filling   pressure. - Aortic valve: Moderately calcified annulus. Trileaflet;   moderately thickened leaflets. There was mild regurgitation.   Valve area (VTI): 2.6 cm^2. Valve area (Vmax): 2.25 cm^2. Valve   area (Vmean): 1.81 cm^2. - Mitral valve: Moderately calcified annulus. Mildly thickened   leaflets . - Left atrium: The atrium was mildly to moderately dilated. - Technically adequate study.  NST: 11/2014  The study is normal.  This is a low risk study.  Nuclear stress EF: 63%  Assessment:    1. Preoperative cardiovascular examination   2. Coronary artery disease involving coronary bypass graft of native heart without angina pectoris   3. Essential hypertension   4. Mixed hyperlipidemia      Plan:   In order of problems listed above:  1. Preoperative Cardiac Clearance -The patient is planning to undergo spinal surgery in 01/2018 by Dr. Kathyrn Sheriff with [facility]. While the patient does have a history of known CAD, this has been stable over the past several years. Most recent echocardiogram just a few months ago showed a preserved EF of 55 to 60% with no regional wall motion abnormalities. Recent EKG  showed his known RBBB with no acute ischemic changes. He has been walking for over 3 miles each day without any anginal symptoms. - His overall RCRI risk is low at 0.9% risk of any major cardiac events. He is of acceptable risk to proceed without additional testing at this time. Given that he has not required recent stent placement within the past 12 months, ASA can be held for 5 days prior to the procedure if indicated by the surgeon. He is not currently on anticoagulant therapy.   2. CAD - s/p CABG in 1998, DES to [place] in 2004 and 2006 with subsequent restenosis by cath in 2009 and medical management recommended. Most recent ischemic evaluation was a low-risk NST in 11/2014. He did have an echocardiogram in 08/2017 which showed a preserved EF of 55 to 60% with no regional wall motion abnormalities. - He denies any recent anginal symptoms. - Continue ASA, beta-blocker, and statin therapy.  3. HTN - BP is well controlled at 128/76 during today's visit. - Continue Coreg 6.25mg  BID.   4. HLD - followed by PCP. Most recent FLP by review of EPIC in 02/2017 showed total cholesterol 93, HDL 37, and LDL 48. He is currently on Atorvastatin 20 mg daily, Zetia 10 mg daily, and Niacin 500mg  in AM and 1000mg  in PM. - With his recent FLP results, there is no indication for Niacin at this time and based on the THRIVE study several years ago, it does not provide any outcome benefits and can actually be harmful over time. Will reduce to 500mg  daily for one week then discontinue.    Medication Adjustments/Labs and Tests Ordered: Current medicines are reviewed at length  with the patient today.  Concerns regarding medicines are outlined above.  Medication changes, Labs and Tests ordered today are listed in the Patient Instructions below. Patient Instructions  Medication Instructions:  Your physician has recommended you make the following change in your medication:  Decrease  Niacin to 500 mg Daily for one week  and then stop.    Labwork: NONE   Testing/Procedures: NONE   Follow-Up: Your physician wants you to follow-up in: 1 Year.  You will receive a reminder letter in the mail two months in advance. If you don't receive a letter, please call our office to schedule the follow-up appointment.   Any Other Special Instructions Will Be Listed Below (If Applicable).     If you need a refill on your cardiac medications before your next appointment, please call your pharmacy.      Signed, Erma Heritage, PA-C  12/23/2017 3:43 PM    [facility] [address] [place], [place] [address] Phone: [phone]

## 2017-12-23 ENCOUNTER — Encounter: Payer: Self-pay | Admitting: Student

## 2017-12-23 ENCOUNTER — Ambulatory Visit (INDEPENDENT_AMBULATORY_CARE_PROVIDER_SITE_OTHER): Payer: Medicare Other | Admitting: Student

## 2017-12-23 VITALS — BP 128/76 | HR 66 | Ht 72.0 in | Wt 177.0 lb

## 2017-12-23 DIAGNOSIS — I10 Essential (primary) hypertension: Secondary | ICD-10-CM

## 2017-12-23 DIAGNOSIS — E782 Mixed hyperlipidemia: Secondary | ICD-10-CM

## 2017-12-23 DIAGNOSIS — I25810 Atherosclerosis of coronary artery bypass graft(s) without angina pectoris: Secondary | ICD-10-CM

## 2017-12-23 DIAGNOSIS — Z01810 Encounter for preprocedural cardiovascular examination: Secondary | ICD-10-CM | POA: Diagnosis not present

## 2017-12-23 DIAGNOSIS — I2510 Atherosclerotic heart disease of native coronary artery without angina pectoris: Secondary | ICD-10-CM

## 2017-12-23 NOTE — Patient Instructions (Signed)
Medication Instructions:  Your physician has recommended you make the following change in your medication:  Decrease  Niacin to 500 mg Daily for one week and then stop.    Labwork: NONE   Testing/Procedures: NONE   Follow-Up: Your physician wants you to follow-up in: 1 Year.  You will receive a reminder letter in the mail two months in advance. If you don't receive a letter, please call our office to schedule the follow-up appointment.   Any Other Special Instructions Will Be Listed Below (If Applicable).     If you need a refill on your cardiac medications before your next appointment, please call your pharmacy.

## 2018-01-20 ENCOUNTER — Ambulatory Visit: Payer: Medicare Other | Admitting: Cardiovascular Disease

## 2018-04-07 ENCOUNTER — Other Ambulatory Visit: Payer: Self-pay | Admitting: Internal Medicine

## 2018-04-07 DIAGNOSIS — E569 Vitamin deficiency, unspecified: Secondary | ICD-10-CM

## 2018-04-07 DIAGNOSIS — Z8042 Family history of malignant neoplasm of prostate: Secondary | ICD-10-CM

## 2018-05-30 ENCOUNTER — Ambulatory Visit (HOSPITAL_COMMUNITY)
Admission: RE | Admit: 2018-05-30 | Discharge: 2018-05-30 | Disposition: A | Discharge status: Home / Self Care | Payer: Medicare Other | Source: Ambulatory Visit | Attending: Urology | Admitting: Urology

## 2018-05-30 ENCOUNTER — Other Ambulatory Visit: Payer: Self-pay | Admitting: Urology

## 2018-05-30 DIAGNOSIS — N200 Calculus of kidney: Secondary | ICD-10-CM

## 2018-06-03 ENCOUNTER — Ambulatory Visit (INDEPENDENT_AMBULATORY_CARE_PROVIDER_SITE_OTHER): Payer: Medicare Other | Admitting: Urology

## 2018-06-03 DIAGNOSIS — N401 Enlarged prostate with lower urinary tract symptoms: Secondary | ICD-10-CM | POA: Diagnosis not present

## 2018-06-03 DIAGNOSIS — N200 Calculus of kidney: Secondary | ICD-10-CM | POA: Diagnosis not present

## 2018-06-03 DIAGNOSIS — N3941 Urge incontinence: Secondary | ICD-10-CM

## 2018-07-15 ENCOUNTER — Ambulatory Visit (INDEPENDENT_AMBULATORY_CARE_PROVIDER_SITE_OTHER): Payer: Medicare Other | Admitting: Urology

## 2018-07-15 DIAGNOSIS — R3912 Poor urinary stream: Secondary | ICD-10-CM

## 2018-07-15 DIAGNOSIS — N401 Enlarged prostate with lower urinary tract symptoms: Secondary | ICD-10-CM | POA: Diagnosis not present

## 2018-07-15 DIAGNOSIS — N200 Calculus of kidney: Secondary | ICD-10-CM | POA: Diagnosis not present

## 2018-09-02 ENCOUNTER — Other Ambulatory Visit: Payer: Self-pay

## 2018-09-02 ENCOUNTER — Other Ambulatory Visit (HOSPITAL_COMMUNITY)
Admission: RE | Admit: 2018-09-02 | Discharge: 2018-09-02 | Disposition: A | Discharge status: Home / Self Care | Payer: Medicare Other | Source: Ambulatory Visit | Attending: Family Medicine | Admitting: Family Medicine

## 2018-09-02 DIAGNOSIS — E1165 Type 2 diabetes mellitus with hyperglycemia: Secondary | ICD-10-CM | POA: Diagnosis present

## 2018-09-02 DIAGNOSIS — E7849 Other hyperlipidemia: Secondary | ICD-10-CM | POA: Insufficient documentation

## 2018-09-02 DIAGNOSIS — E559 Vitamin D deficiency, unspecified: Secondary | ICD-10-CM | POA: Diagnosis present

## 2018-09-02 DIAGNOSIS — R5383 Other fatigue: Secondary | ICD-10-CM | POA: Insufficient documentation

## 2018-09-02 LAB — COMPREHENSIVE METABOLIC PANEL
ALK PHOS: 59 U/L (ref 38–126)
ALT: 25 U/L (ref 0–44)
AST: 27 U/L (ref 15–41)
Albumin: 4.4 g/dL (ref 3.5–5.0)
Anion gap: 7 (ref 5–15)
BUN: 21 mg/dL (ref 8–23)
CHLORIDE: 105 mmol/L (ref 98–111)
CO2: 28 mmol/L (ref 22–32)
CREATININE: 0.89 mg/dL (ref 0.61–1.24)
Calcium: 9.8 mg/dL (ref 8.9–10.3)
GFR calc Af Amer: >60 mL/min (ref >60)
Glucose, Bld: 106 mg/dL — ABNORMAL HIGH (ref 70–99)
Potassium: 4.9 mmol/L (ref 3.5–5.1)
Sodium: 140 mmol/L (ref 135–145)
Total Bilirubin: 1.2 mg/dL (ref 0.3–1.2)
Total Protein: 7.3 g/dL (ref 6.5–8.1)

## 2018-09-02 LAB — T4, FREE: Free T4: 0.97 ng/dL (ref 0.82–1.77)

## 2018-09-02 LAB — TSH: TSH: 2.618 u[IU]/mL (ref 0.350–4.500)

## 2018-09-02 LAB — LIPID PANEL
CHOLESTEROL: 94 mg/dL (ref 0–200)
HDL: 33 mg/dL — ABNORMAL LOW (ref >40)
LDL CALC: 48 mg/dL (ref 0–99)
Total CHOL/HDL Ratio: 2.8 RATIO
Triglycerides: 65 mg/dL (ref <150)
VLDL: 13 mg/dL (ref 0–40)

## 2018-09-02 LAB — HEMOGLOBIN A1C
Hgb A1c MFr Bld: 5.8 % — ABNORMAL HIGH (ref 4.8–5.6)
MEAN PLASMA GLUCOSE: 119.76 mg/dL

## 2018-09-05 LAB — CALCITRIOL (1,25 DI-OH VIT D): Vit D, 1,25-Dihydroxy: 29 pg/mL (ref 19.9–79.3)

## 2018-12-01 ENCOUNTER — Other Ambulatory Visit (HOSPITAL_COMMUNITY): Payer: Self-pay | Admitting: Physician Assistant

## 2018-12-01 ENCOUNTER — Other Ambulatory Visit: Payer: Self-pay | Admitting: Physician Assistant

## 2018-12-01 DIAGNOSIS — M4726 Other spondylosis with radiculopathy, lumbar region: Secondary | ICD-10-CM

## 2018-12-02 ENCOUNTER — Other Ambulatory Visit: Payer: Self-pay

## 2018-12-02 ENCOUNTER — Other Ambulatory Visit (HOSPITAL_COMMUNITY): Payer: Self-pay | Admitting: Urology

## 2018-12-02 ENCOUNTER — Ambulatory Visit (HOSPITAL_COMMUNITY)
Admission: RE | Admit: 2018-12-02 | Discharge: 2018-12-02 | Disposition: A | Discharge status: Home / Self Care | Payer: Medicare Other | Source: Ambulatory Visit | Attending: Urology | Admitting: Urology

## 2018-12-02 DIAGNOSIS — N200 Calculus of kidney: Secondary | ICD-10-CM | POA: Diagnosis present

## 2018-12-08 ENCOUNTER — Ambulatory Visit (HOSPITAL_COMMUNITY)
Admission: RE | Admit: 2018-12-08 | Discharge: 2018-12-08 | Disposition: A | Discharge status: Home / Self Care | Payer: Medicare Other | Source: Ambulatory Visit | Attending: Physician Assistant | Admitting: Physician Assistant

## 2018-12-08 ENCOUNTER — Other Ambulatory Visit: Payer: Self-pay

## 2018-12-08 DIAGNOSIS — M4726 Other spondylosis with radiculopathy, lumbar region: Secondary | ICD-10-CM

## 2018-12-12 ENCOUNTER — Other Ambulatory Visit: Payer: Self-pay | Admitting: Internal Medicine

## 2018-12-12 ENCOUNTER — Other Ambulatory Visit: Payer: Self-pay

## 2018-12-12 DIAGNOSIS — Z20822 Contact with and (suspected) exposure to covid-19: Secondary | ICD-10-CM

## 2018-12-16 LAB — NOVEL CORONAVIRUS, NAA: SARS-CoV-2, NAA: NOT DETECTED

## 2018-12-27 ENCOUNTER — Ambulatory Visit: Payer: Medicare Other | Admitting: Student

## 2018-12-27 NOTE — Progress Notes (Signed)
Cardiology Office Note    Date:  12/28/2018   ID:  Jorge Bishop, DOB 25-May-1936, MRN [id number]  PCP:  [name], MD  Cardiologist: Kate Sable, MD    Chief Complaint  Patient presents with  . Follow-up    Annual Visit    History of Present Illness:    Jorge Bishop is a 83 y.o. male past medical history of CAD (s/p CABG in 1998 with DES to [place] in 2004, restenosis by cath in 2009 with medical management recommended), HTN, HLD, and DJD who presents to the office today for annual follow-up.   He was last examined by myself in 12/2017 and reported walking over 3 miles each day without any anginal symptoms  Did have intermittent dyspnea which was typically noted when climbing stairs but this had been present for several years with no recent change. He was cleared for his upcoming spinal surgery with instructions to hold ASA 5 days prior to the procedure. He was continued on ASA, Coreg, and statin therapy. Niacin was discontinued given his cholesterol levels were at goal.   In talking with the patient today, he reports overall doing well from a cardiac perspective since his last office visit. He was previously volunteering with Cardiac Rehab but has been unable to do so given COVID-19. He has been staying active at home and denies any recent chest pain, dyspnea on exertion, palpitations, orthopnea, PND, or lower extremity edema. Typically walks 2-3 miles per day but has not done this in a few weeks due to the temperatures. He has a fascinating sleep schedule and does not go to bed until 0400 and sleeps in until approximately 1100. Says he has been doing this for several years.   He has multiple questions about his medication regimen and is interested in seeing if any can be discontinued as he wants to simplify this.    Past Medical History:  Diagnosis Date  . Arteriosclerotic cardiovascular disease (ASCVD)    Coronary artery bypass graft surgery in 12/98; DES to the RCA  SVG in 07/2002; restenosis of the saphenous vein graft stent and renal intervention in 2006; 70% first diagonal present; calf in 11/2007-total obstruction of the saphenous vein graft to the first marginal  . Benign prostatic hypertrophy   . Cerebrovascular disease    60% bilateral internal carotid artery stenosis in 2012  . Coronary artery disease    4 stents  . Diverticulosis   . DJD (degenerative joint disease)   . Erectile dysfunction   . GERD (gastroesophageal reflux disease)   . History of blood transfusion   . History of kidney stones   . Hyperlipidemia    Lipid profile in 12/2009:104, 91, 36, 50  . Migraine headache   . Right bundle branch block   . Shortness of breath   . Thrombocytopenia (HCC)    platelets of 104 in 03/2009  . Upper gastrointestinal hemorrhage 94/70    Helicobacter pylori positive; subsequent laparoscopic fundoplication-2009    Past Surgical History:  Procedure Laterality Date  . CARDIAC CATHETERIZATION     4 cardiac stents  . CATARACT EXTRACTION W/PHACO Right 07/25/2012   Procedure: CATARACT EXTRACTION PHACO AND INTRAOCULAR LENS PLACEMENT (IOC);  Surgeon: Tonny Branch, MD;  Location: [facility];  Service: Ophthalmology;  Laterality: Right;  CDE:23.31  . CATARACT EXTRACTION W/PHACO Left 08/18/2012   Procedure: CATARACT EXTRACTION PHACO AND INTRAOCULAR LENS PLACEMENT (IOC);  Surgeon: Tonny Branch, MD;  Location: [facility];  Service: Ophthalmology;  Laterality: Left;  CDE: 16.07  . COLONOSCOPY N/A 08/01/2014   Procedure: COLONOSCOPY;  Surgeon: Rogene Houston, MD;  Location: [facility];  Service: Endoscopy;  Laterality: N/A;  125 - moved to 10:30 - Ann to notify pt  . CORONARY ARTERY BYPASS GRAFT  1998   . CYSTOSCOPY/URETEROSCOPY/HOLMIUM LASER/STENT PLACEMENT Right 11/12/2017   Procedure: CYSTOSCOPY/RIGHT RETROGRADE PYELOGRAM/RIGHT URETEROSCOPY/HOLMIUM LASER LITHOTRIPSY RIGHT URETERAL CALCULUS/RIGHT URETERAL STENT PLACEMENT;  Surgeon: [name], MD;  Location: [facility];   Service: Urology;  Laterality: Right;  right ureteral calculus given to patient's wife per Dr. [name]  . EXTRACORPOREAL SHOCK WAVE LITHOTRIPSY Right 09/13/2017   Procedure: RIGHT EXTRACORPOREAL SHOCK WAVE LITHOTRIPSY (ESWL);  Surgeon: Lucas Mallow, MD;  Location: [facility];  Service: Urology;  Laterality: Right;  . HERNIA REPAIR     right inguinal  . LAPAROSCOPIC NISSEN FUNDOPLICATION  [id number], 0721    revision in 2009 along with repair of paraesophagel hernia   . PARAESOPHAGEAL HERNIA REPAIR      Current Medications: Outpatient Medications Prior to Visit  Medication Sig Dispense Refill  . aspirin (ASPIRIN LOW DOSE) 81 MG tablet Take 81 mg by mouth daily.      Marland Kitchen atorvastatin (LIPITOR) 20 MG tablet Take 20 mg by mouth at bedtime.      . carvedilol (COREG) 12.5 MG tablet Take 6.25 mg by mouth 2 (two) times daily with a meal.    . Cholecalciferol (VITAMIN D3) 10000 units TABS Take by mouth.    . FIBER PO Take 2 capsules by mouth daily as needed (constipation).    Marland Kitchen loperamide (IMODIUM A-D) 2 MG tablet Take 0.5 tablets (1 mg total) by mouth at bedtime. (Patient taking differently: Take 1 mg by mouth as needed for diarrhea or loose stools. ) 1 tablet 0  . metFORMIN (GLUCOPHAGE) 500 MG tablet     . omeprazole (PRILOSEC) 20 MG capsule Take 20 mg by mouth daily as needed (acid reflux).     . ondansetron (ZOFRAN) 4 MG tablet Take 4 mg by mouth every 8 (eight) hours as needed for nausea or vomiting.     . promethazine (PHENERGAN) 25 MG tablet Take 25 mg by mouth every 6 (six) hours as needed for nausea or vomiting.    . silodosin (RAPAFLO) 8 MG CAPS capsule     . ezetimibe (ZETIA) 10 MG tablet Take 10 mg by mouth daily.    Marland Kitchen loratadine (CLARITIN) 10 MG tablet Take 10 mg by mouth daily as needed for allergies.    . niacin (NIASPAN) 500 MG CR tablet Take 500 mg by mouth See admin instructions. Take 500 mg in the morning and 1000 mg in the evening    . Tamsulosin HCl (FLOMAX) 0.4 MG CAPS Take 0.4 mg by  mouth 2 (two) times daily.      No facility-administered medications prior to visit.      Allergies:   Clarithromycin, Codeine, Erythromycin, and Sulfa antibiotics   Social History   Socioeconomic History  . Marital status: Married    Spouse name: Not on file  . Number of children: Not on file  . Years of education: Not on file  . Highest education level: Not on file  Occupational History  . Occupation: Full time    Employer: RETIRED  Social Needs  . Financial resource strain: Not on file  . Food insecurity    Worry: Not on file    Inability: Not on file  . Transportation needs    Medical: Not  on file    Non-medical: Not on file  Tobacco Use  . Smoking status: Never Smoker  . Smokeless tobacco: Never Used  Substance and Sexual Activity  . Alcohol use: No    Alcohol/week: 0.0 standard drinks  . Drug use: No  . Sexual activity: Yes    Birth control/protection: None  Lifestyle  . Physical activity    Days per week: Not on file    Minutes per session: Not on file  . Stress: Not on file  Relationships  . Social Herbalist on phone: Not on file    Gets together: Not on file    Attends religious service: Not on file    Active member of club or organization: Not on file    Attends meetings of clubs or organizations: Not on file    Relationship status: Not on file  Other Topics Concern  . Not on file  Social History Narrative  . Not on file     Family History:  The patient's family history includes CAD in his father.   Review of Systems:   Please see the history of present illness.     General:  No chills, fever, night sweats or weight changes.  Cardiovascular:  No chest pain, dyspnea on exertion, edema, orthopnea, palpitations, paroxysmal nocturnal dyspnea. Dermatological: No rash, lesions/masses Respiratory: No cough, dyspnea Urologic: No hematuria, dysuria Abdominal:   No nausea, vomiting, diarrhea, bright red blood per rectum, melena, or  hematemesis Neurologic:  No visual changes, wkns, changes in mental status.  He denies any of the above symptoms.   All other systems reviewed and are otherwise negative except as noted above.   Physical Exam:    VS:  BP 123/74   Pulse 65   Temp 98.3 F (36.8 C)   Ht 6' (1.829 m)   Wt 173 lb (78.5 kg)   BMI 23.46 kg/m    General: Well developed, well nourished,male appearing in no acute distress. Head: Normocephalic, atraumatic, sclera non-icteric, no xanthomas, nares are without discharge.  Neck: No carotid bruits. JVD not elevated.  Lungs: Respirations regular and unlabored, without wheezes or rales.  Heart: Regular rate and rhythm. No S3 or S4.  No murmur, no rubs, or gallops appreciated. Abdomen: Soft, non-tender, non-distended with normoactive bowel sounds. No hepatomegaly. No rebound/guarding. No obvious abdominal masses. Msk:  Strength and tone appear normal for age. No joint deformities or effusions. Extremities: No clubbing or cyanosis. No lower extremity edema.  Distal pedal pulses are 2+ bilaterally. Neuro: Alert and oriented X 3. Moves all extremities spontaneously. No focal deficits noted. Psych:  Responds to questions appropriately with a normal affect. Skin: No rashes or lesions noted  Wt Readings from Last 3 Encounters:  12/28/18 173 lb (78.5 kg)  12/23/17 177 lb (80.3 kg)  11/10/17 179 lb (81.2 kg)     Studies/Labs Reviewed:   EKG:  EKG is ordered today.  The ekg ordered today demonstrates NSR, HR 65, with PAC's and known inferior Q-waves and RBBB. No acute changes when compared to prior tracing.   Recent Labs: 09/02/2018: ALT 25; BUN 21; Creatinine, Ser 0.89; Potassium 4.9; Sodium 140; TSH 2.618   Lipid Panel    Component Value Date/Time   CHOL 94 09/02/2018 1130   TRIG 65 09/02/2018 1130   HDL 33 (L) 09/02/2018 1130   CHOLHDL 2.8 09/02/2018 1130   VLDL 13 09/02/2018 1130   LDLCALC 48 09/02/2018 1130    Additional studies/ records that  were  reviewed today include:   NST: 11/2014  The study is normal.  This is a low risk study.  Nuclear stress EF: 63%.  Echocardiogram: 09/10/2017 Study Conclusions  - Left ventricle: The cavity size was normal. Wall thickness was   increased in a pattern of mild LVH. Systolic function was normal.   The estimated ejection fraction was in the range of 55% to 60%.   Wall motion was normal; there were no regional wall motion   abnormalities. Doppler parameters are consistent with abnormal   left ventricular relaxation (grade 1 diastolic dysfunction).   Doppler parameters are consistent with high ventricular filling   pressure. - Aortic valve: Moderately calcified annulus. Trileaflet;   moderately thickened leaflets. There was mild regurgitation.   Valve area (VTI): 2.6 cm^2. Valve area (Vmax): 2.25 cm^2. Valve   area (Vmean): 1.81 cm^2. - Mitral valve: Moderately calcified annulus. Mildly thickened   leaflets . - Left atrium: The atrium was mildly to moderately dilated. - Technically adequate study.  Assessment:    1. Coronary artery disease involving native coronary artery of native heart without angina pectoris   2. Essential hypertension   3. Hyperlipidemia LDL goal <70   4. Type 2 diabetes mellitus without complication, without long-term current use of insulin ([place])      Plan:   In order of problems listed above:  1. CAD - he is s/p CABG in 1998 with DES to [place] in 2004, restenosis by cath in 2009 with medical management recommended. Most recent ischemic evaluation was a NST in 2016 which was low-risk. - he denies any recent chest pain or dyspnea on exertion.  - continue current medication regimen with ASA 81mg  daily, Coreg 6.25mg  BID, and Atorvastatin 20mg  daily.   2. HTN - BP well-controlled at 123/74 during today's visit. Continue current regimen with Coreg 6.25mg  BID.   3. HLD - followed by PCP. FLP in 08/2018 showed total cholesterol of 94, HDL 33, and LDL 48.  He is currently on Atorvastatin 20mg  daily and Zetia 10mg  daily. He wishes to discontinue Zetia if able given his most recent numbers. Recommended he have a trial off of the medication and we can recheck his FLP in 6-8 weeks for reassessment. If overall unchanged, can remain off the medication. If significant rise in LDL would resume. (he also request his follow-up Vitamin D level be obtained at the same time, therefore will place order and forward to PCP once obtained).   4. Type 2 DM - followed by PCP. Hgb A1c at 5.8 when checked in 08/2018. Remains on Metformin.   Medication Adjustments/Labs and Tests Ordered: Current medicines are reviewed at length with the patient today.  Concerns regarding medicines are outlined above.  Medication changes, Labs and Tests ordered today are listed in the Patient Instructions below. Patient Instructions  Medication Instructions:  Your physician recommends that you continue on your current medications as directed. Please refer to the Current Medication list given to you today.  Stop Zetia   If you need a refill on your cardiac medications before your next appointment, please call your pharmacy.   Lab work: Your physician recommends that you return for lab work in: 2Weeks  If you have labs (blood work) drawn today and your tests are completely normal, you will receive your results only by: Marland Kitchen MyChart Message (if you have MyChart) OR . A paper copy in the mail If you have any lab test that is abnormal or we need to change  your treatment, we will call you to review the results.  Testing/Procedures: NONE   Follow-Up: At [facility] you and your health needs are our priority.  As part of our continuing mission to provide you with exceptional heart care, we have created designated Provider Care Teams.  These Care Teams include your primary Cardiologist (physician) and Advanced Practice Providers (APPs -  Physician Assistants and Nurse Practitioners) who  all work together to provide you with the care you need, when you need it. You will need a follow up appointment in 1 Year.  Please call our office 2 months in advance to schedule this appointment.  You may see Kate Sable, MD or one of the following Advanced Practice Providers on your designated Care Team:   Bernerd Pho, PA-C [facility]) . Ermalinda Barrios, PA-C ([place])  Any Other Special Instructions Will Be Listed Below (If Applicable). Thank you for choosing [facility]!      Signed, Erma Heritage, PA-C  12/28/2018 9:09 PM    [place] [address] [place], [place] [address] Phone: [phone] Fax: [phone]

## 2018-12-28 ENCOUNTER — Ambulatory Visit (INDEPENDENT_AMBULATORY_CARE_PROVIDER_SITE_OTHER): Payer: Medicare Other | Admitting: Student

## 2018-12-28 ENCOUNTER — Other Ambulatory Visit: Payer: Self-pay

## 2018-12-28 ENCOUNTER — Encounter: Payer: Self-pay | Admitting: Student

## 2018-12-28 VITALS — BP 123/74 | HR 65 | Temp 98.3°F | Ht 72.0 in | Wt 173.0 lb

## 2018-12-28 DIAGNOSIS — I10 Essential (primary) hypertension: Secondary | ICD-10-CM | POA: Diagnosis not present

## 2018-12-28 DIAGNOSIS — I2510 Atherosclerotic heart disease of native coronary artery without angina pectoris: Secondary | ICD-10-CM | POA: Diagnosis not present

## 2018-12-28 DIAGNOSIS — E785 Hyperlipidemia, unspecified: Secondary | ICD-10-CM

## 2018-12-28 DIAGNOSIS — E119 Type 2 diabetes mellitus without complications: Secondary | ICD-10-CM

## 2018-12-28 NOTE — Patient Instructions (Addendum)
Medication Instructions:  Your physician recommends that you continue on your current medications as directed. Please refer to the Current Medication list given to you today.  Stop Zetia   If you need a refill on your cardiac medications before your next appointment, please call your pharmacy.   Lab work: Your physician recommends that you return for lab work in: 2Weeks  If you have labs (blood work) drawn today and your tests are completely normal, you will receive your results only by: Marland Kitchen MyChart Message (if you have MyChart) OR . A paper copy in the mail If you have any lab test that is abnormal or we need to change your treatment, we will call you to review the results.  Testing/Procedures: NONE   Follow-Up: At [facility], you and your health needs are our priority.  As part of our continuing mission to provide you with exceptional heart care, we have created designated Provider Care Teams.  These Care Teams include your primary Cardiologist (physician) and Advanced Practice Providers (APPs -  Physician Assistants and Nurse Practitioners) who all work together to provide you with the care you need, when you need it. You will need a follow up appointment in 1 Year.  Please call our office 2 months in advance to schedule this appointment.  You may see Kate Sable, MD or one of the following Advanced Practice Providers on your designated Care Team:   Bernerd Pho, PA-C [facility]) . Ermalinda Barrios, PA-C ([place])  Any Other Special Instructions Will Be Listed Below (If Applicable). Thank you for choosing [facility]!

## 2019-01-09 ENCOUNTER — Ambulatory Visit (HOSPITAL_COMMUNITY)
Admission: RE | Admit: 2019-01-09 | Discharge: 2019-01-09 | Disposition: A | Discharge status: Home / Self Care | Payer: Medicare Other | Source: Ambulatory Visit | Attending: Urology | Admitting: Urology

## 2019-01-09 ENCOUNTER — Other Ambulatory Visit: Payer: Self-pay

## 2019-01-09 ENCOUNTER — Other Ambulatory Visit (HOSPITAL_COMMUNITY): Payer: Self-pay | Admitting: Urology

## 2019-01-09 DIAGNOSIS — N200 Calculus of kidney: Secondary | ICD-10-CM

## 2019-01-13 ENCOUNTER — Other Ambulatory Visit: Payer: Self-pay

## 2019-01-13 ENCOUNTER — Ambulatory Visit (INDEPENDENT_AMBULATORY_CARE_PROVIDER_SITE_OTHER): Payer: Medicare Other | Admitting: Urology

## 2019-01-13 DIAGNOSIS — N3941 Urge incontinence: Secondary | ICD-10-CM | POA: Diagnosis not present

## 2019-01-16 DIAGNOSIS — G959 Disease of spinal cord, unspecified: Secondary | ICD-10-CM | POA: Insufficient documentation

## 2019-01-19 ENCOUNTER — Other Ambulatory Visit: Payer: Self-pay | Admitting: Urology

## 2019-01-19 ENCOUNTER — Other Ambulatory Visit (HOSPITAL_COMMUNITY): Payer: Self-pay | Admitting: Urology

## 2019-01-19 DIAGNOSIS — N201 Calculus of ureter: Secondary | ICD-10-CM

## 2019-01-20 ENCOUNTER — Other Ambulatory Visit: Payer: Self-pay | Admitting: Neurosurgery

## 2019-01-20 ENCOUNTER — Other Ambulatory Visit (HOSPITAL_COMMUNITY): Payer: Self-pay | Admitting: Neurosurgery

## 2019-01-20 DIAGNOSIS — G959 Disease of spinal cord, unspecified: Secondary | ICD-10-CM

## 2019-01-25 ENCOUNTER — Ambulatory Visit (HOSPITAL_COMMUNITY): Payer: Medicare Other

## 2019-01-25 ENCOUNTER — Other Ambulatory Visit (HOSPITAL_COMMUNITY): Payer: Medicare Other

## 2019-01-26 ENCOUNTER — Ambulatory Visit (HOSPITAL_COMMUNITY)
Admission: RE | Admit: 2019-01-26 | Discharge: 2019-01-26 | Disposition: A | Discharge status: Home / Self Care | Payer: Medicare Other | Source: Ambulatory Visit | Attending: Neurosurgery | Admitting: Neurosurgery

## 2019-01-26 ENCOUNTER — Other Ambulatory Visit: Payer: Self-pay

## 2019-01-26 DIAGNOSIS — G959 Disease of spinal cord, unspecified: Secondary | ICD-10-CM | POA: Insufficient documentation

## 2019-02-08 ENCOUNTER — Other Ambulatory Visit: Payer: Self-pay

## 2019-02-08 ENCOUNTER — Ambulatory Visit (HOSPITAL_COMMUNITY)
Admission: RE | Admit: 2019-02-08 | Discharge: 2019-02-08 | Disposition: A | Discharge status: Home / Self Care | Payer: Medicare Other | Source: Ambulatory Visit | Attending: Urology | Admitting: Urology

## 2019-02-08 DIAGNOSIS — N201 Calculus of ureter: Secondary | ICD-10-CM | POA: Diagnosis not present

## 2019-02-10 ENCOUNTER — Ambulatory Visit (INDEPENDENT_AMBULATORY_CARE_PROVIDER_SITE_OTHER): Payer: Medicare Other | Admitting: Urology

## 2019-02-10 DIAGNOSIS — N201 Calculus of ureter: Secondary | ICD-10-CM | POA: Diagnosis not present

## 2019-02-10 DIAGNOSIS — N401 Enlarged prostate with lower urinary tract symptoms: Secondary | ICD-10-CM

## 2019-02-10 DIAGNOSIS — N3941 Urge incontinence: Secondary | ICD-10-CM | POA: Diagnosis not present

## 2019-02-10 DIAGNOSIS — R3912 Poor urinary stream: Secondary | ICD-10-CM | POA: Diagnosis not present

## 2019-02-11 IMAGING — US US RENAL
1 series · 14 of 25 positions shown · non-contrast
Comparison: CT abdomen pelvis 09/08/2017

CLINICAL DATA: Ureteral calculus

EXAM:
RENAL / URINARY TRACT ULTRASOUND COMPLETE

[Series 1: us renal · 0.19mm/px · 14 of 31 slices shown]
[im 1/31]
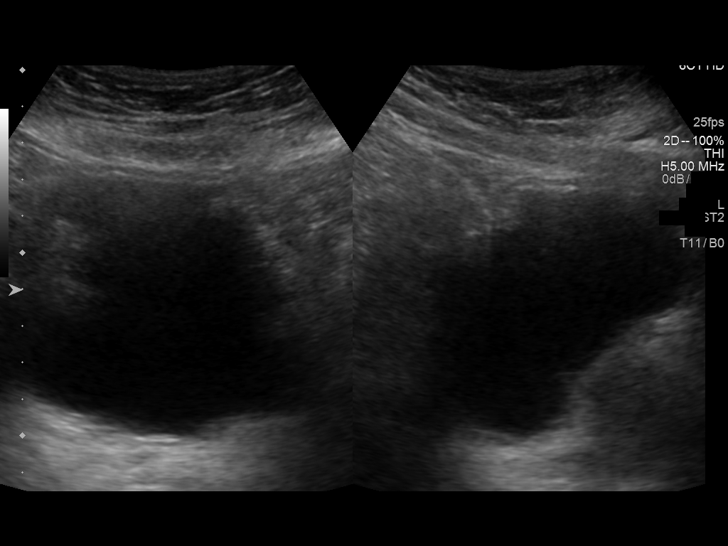
[im 3/31]
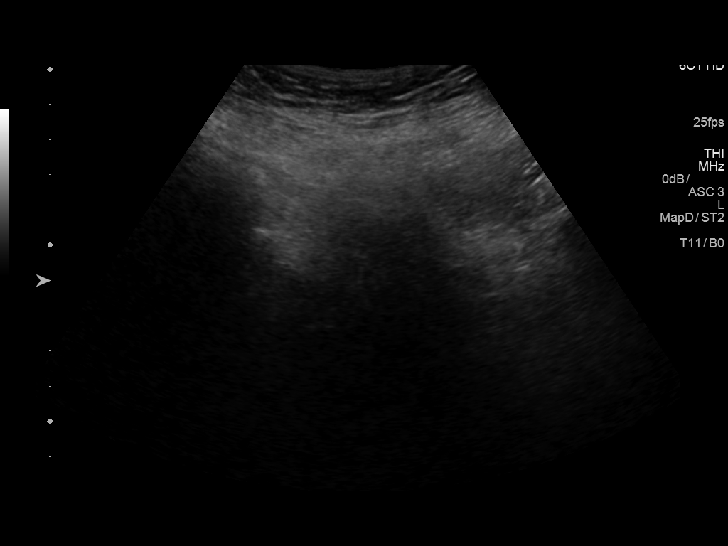
[im 6/31]
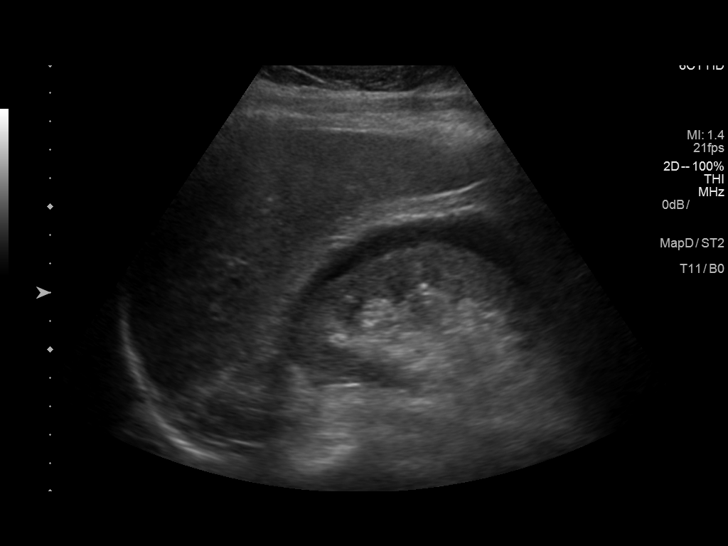
[im 8/31]
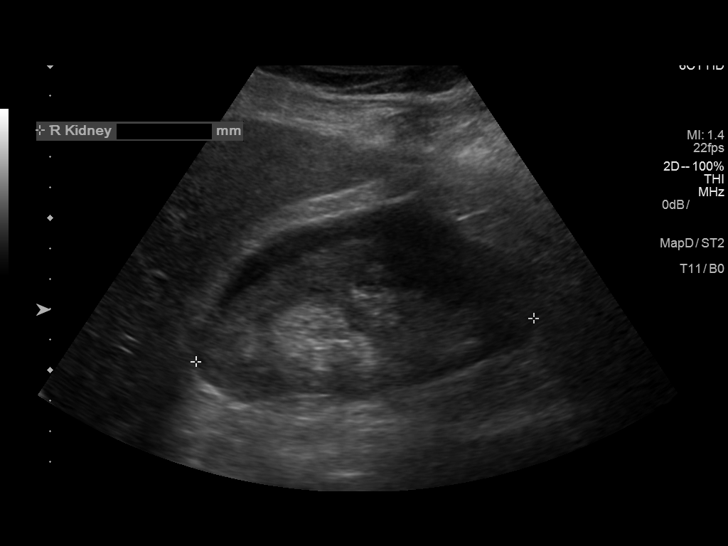
[im 11/31]
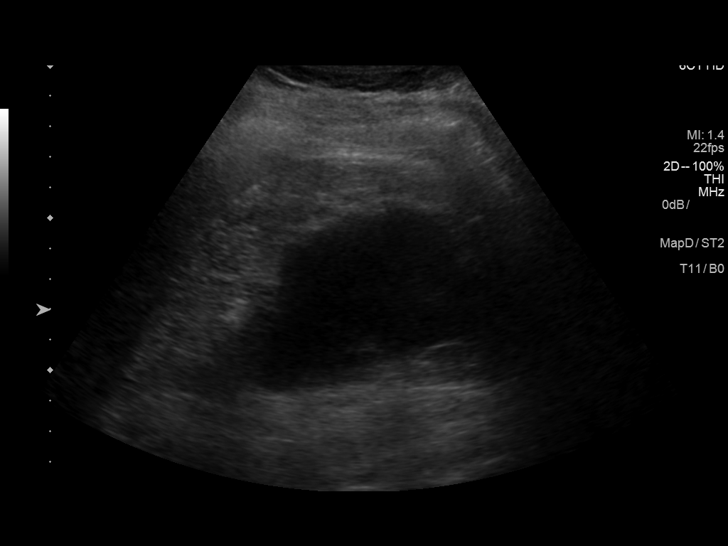
[im 12/31]
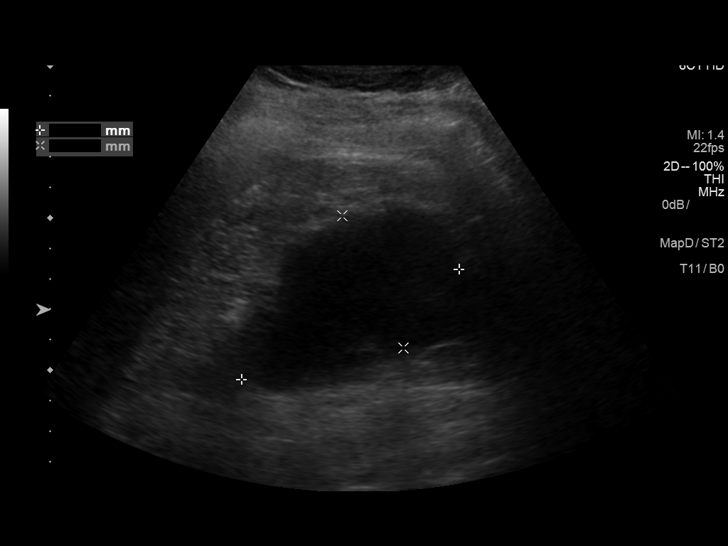
[im 14/31]
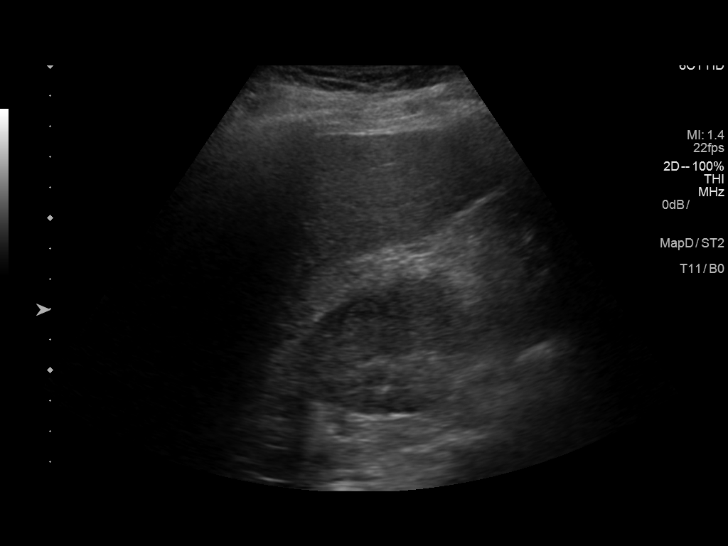
[im 17/31]
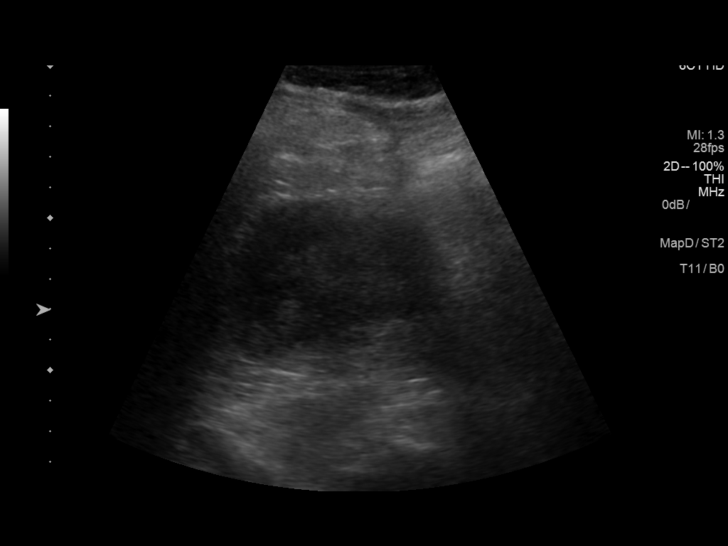
[im 19/31]
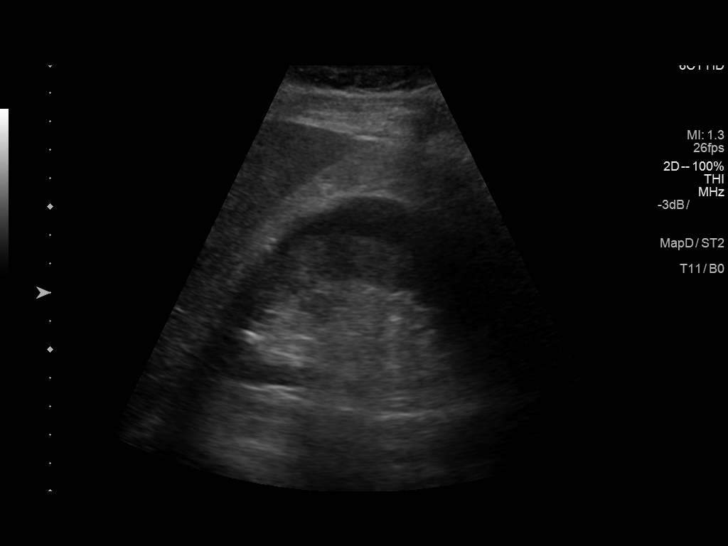
[im 21/31]
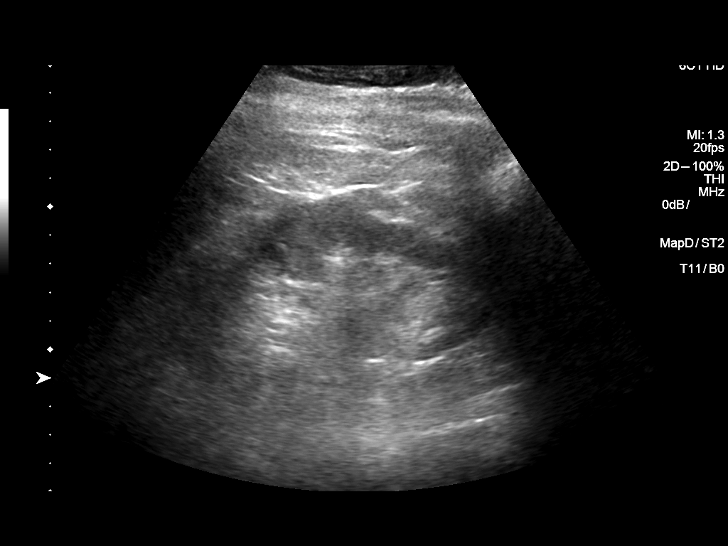
[im 23/31]
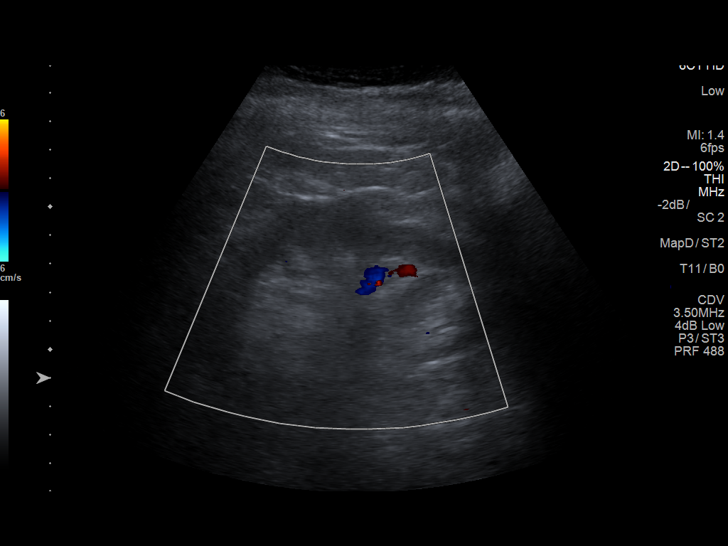
[im 26/31]
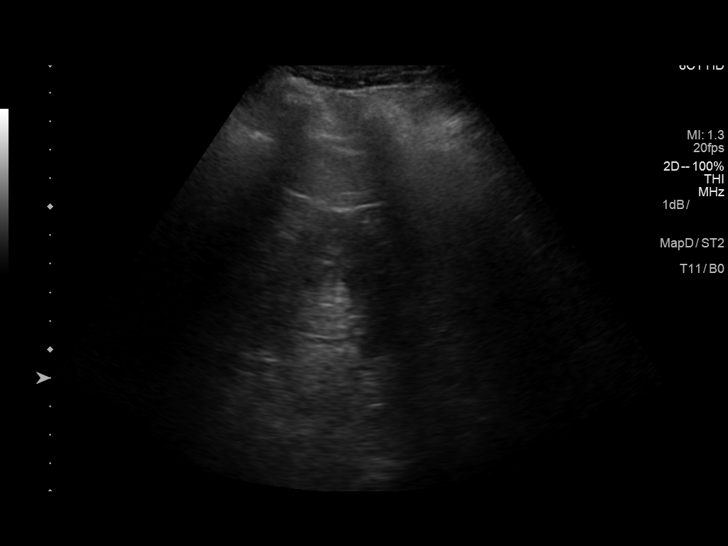
[im 28/31]
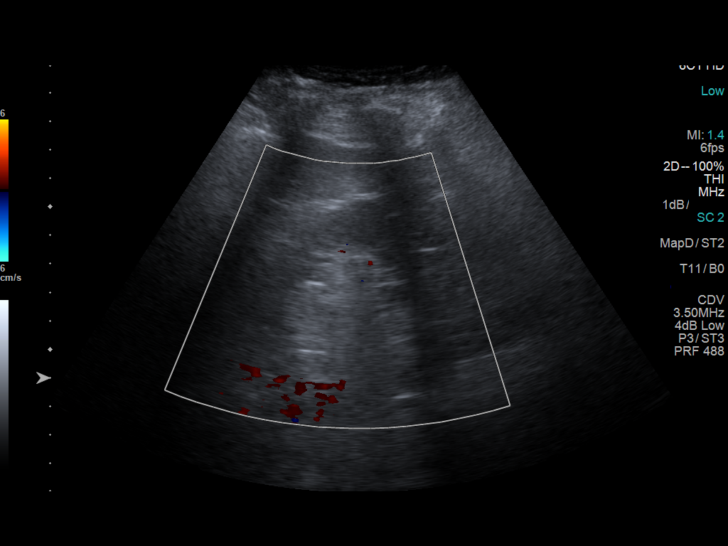
[im 31/31]
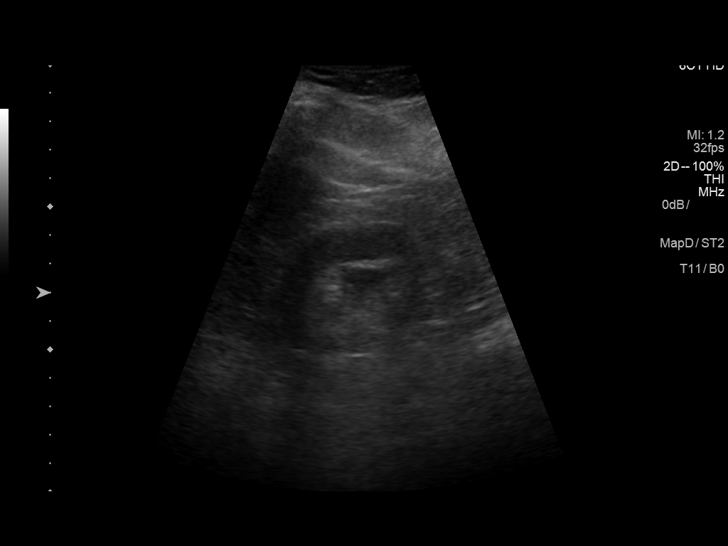

[14 of 25 positions shown; findings below may reference images not displayed]

FINDINGS: Right Kidney:

Length: 11.8 cm. Normal cortical thickness and echogenicity. No
mass, hydronephrosis or shadowing calcification. Perinephric fluid
collection at RIGHT kidney up to 15 mm thick surrounding the RIGHT
kidney, new since prior CT.

Left Kidney:

Length: 11.3 cm. Normal cortical thickness and echogenicity for age.
Hypoechoic areas at the renal sinus correspond to peripelvic cysts
on the prior CT. No solid mass, hydronephrosis or shadowing
calcification.

Bladder:

Well distended urinary bladder.
IMPRESSION: Peripelvic LEFT renal cysts.

New perinephric fluid collection up to 15 mm thick surrounding the
RIGHT kidney without evidence of renal mass or hydronephrosis.

## 2019-02-13 ENCOUNTER — Other Ambulatory Visit: Payer: Self-pay | Admitting: Urology

## 2019-03-01 NOTE — Patient Instructions (Addendum)
DUE TO COVID-19 ONLY ONE VISITOR IS ALLOWED TO COME WITH YOU AND STAY IN THE WAITING ROOM ONLY DURING PRE OP AND PROCEDURE DAY OF SURGERY.  THE 1 VISITOR MAY VISIT WITH YOU AFTER SURGERY IN YOUR PRIVATE ROOM DURING VISITING HOURS ONLY!   YOU NEED TO HAVE A COVID 19 TEST ON_Friday 9/18/20______ @__12 :45_____, THIS TEST MUST BE DONE BEFORE SURGERY, COME  [address], [place] , [address].  ([place])  ONCE YOUR COVID TEST IS COMPLETED, PLEASE BEGIN THE QUARANTINE INSTRUCTIONS AS OUTLINED IN YOUR HANDOUT.                Jorge Bishop   Your procedure is scheduled on: Tuesday 03/07/19   Report to [facility] Main  Entrance   Report to admitting at   8:30 AM     Call this number if you have problems the morning of surgery [phone]    Remember: Do not eat food or drink liquids :After Midnight.   BRUSH YOUR TEETH MORNING OF SURGERY AND RINSE YOUR MOUTH OUT, NO CHEWING GUM CANDY OR MINTS.     Take these medicines the morning of surgery with A SIP OF WATER:  Coreg                                You may not have any metal on your body including hair pins and              piercings             Do not wear jewelry, lotions, powders or  deodorant              Men may shave face and neck.   Do not bring valuables to the hospital. [place].  Contacts, dentures or bridgework may not be worn into surgery.        Special Instructions: N/A              Please read over the following fact sheets you were given: _____________________________________________________________________             [facility] - Preparing for Surgery  Before surgery, you can play an important role.   Because skin is not sterile, your skin needs to be as free of germs as possible.   You can reduce the number of germs on your skin by washing with CHG (chlorahexidine gluconate) soap before surgery.   CHG is an antiseptic  cleaner which kills germs and bonds with the skin to continue killing germs even after washing. Please DO NOT use if you have an allergy to CHG or antibacterial soaps.   If your skin becomes reddened/irritated stop using the CHG and inform your nurse when you arrive at Short Stay.   You may shave your face/neck.  Please follow these instructions carefully:  1.  Shower with CHG Soap the night before surgery and the  morning of Surgery.  2.  If you choose to wash your hair, wash your hair first as usual with your  normal  shampoo.  3.  After you shampoo, rinse your hair and body thoroughly to remove the  shampoo.  4.  Use CHG as you would any other liquid soap.  You can apply chg directly  to the skin and wash                       Gently with a scrungie or clean washcloth.  5.  Apply the CHG Soap to your body ONLY FROM THE NECK DOWN.   Do not use on face/ open                           Wound or open sores. Avoid contact with eyes, ears mouth and genitals (private parts).                       Wash face,  Genitals (private parts) with your normal soap.             6.  Wash thoroughly, paying special attention to the area where your surgery  will be performed.  7.  Thoroughly rinse your body with warm water from the neck down.  8.  DO NOT shower/wash with your normal soap after using and rinsing off  the CHG Soap.             9.  Pat yourself dry with a clean towel.            10.  Wear clean pajamas.            11.  Place clean sheets on your bed the night of your first shower and do not  sleep with pets. Day of Surgery : Do not apply any lotions/deodorants the morning of surgery.  Please wear clean clothes to the hospital/surgery center.  FAILURE TO FOLLOW THESE INSTRUCTIONS MAY RESULT IN THE CANCELLATION OF YOUR SURGERY PATIENT SIGNATURE_________________________________  NURSE  SIGNATURE__________________________________  ________________________________________________________________________

## 2019-03-02 ENCOUNTER — Other Ambulatory Visit (HOSPITAL_COMMUNITY)
Admission: RE | Admit: 2019-03-02 | Discharge: 2019-03-02 | Disposition: A | Discharge status: Home / Self Care | Payer: Medicare Other | Source: Ambulatory Visit | Attending: Internal Medicine | Admitting: Internal Medicine

## 2019-03-02 DIAGNOSIS — I10 Essential (primary) hypertension: Secondary | ICD-10-CM | POA: Insufficient documentation

## 2019-03-02 LAB — BASIC METABOLIC PANEL
Anion gap: 6 (ref 5–15)
BUN: 23 mg/dL (ref 8–23)
CO2: 27 mmol/L (ref 22–32)
Calcium: 9.7 mg/dL (ref 8.9–10.3)
Chloride: 106 mmol/L (ref 98–111)
Creatinine, Ser: 0.98 mg/dL (ref 0.61–1.24)
GFR calc Af Amer: >60 mL/min (ref >60)
GFR calc non Af Amer: >60 mL/min (ref >60)
Glucose, Bld: 112 mg/dL — ABNORMAL HIGH (ref 70–99)
Potassium: 4.7 mmol/L (ref 3.5–5.1)
Sodium: 139 mmol/L (ref 135–145)

## 2019-03-02 LAB — LIPID PANEL
Cholesterol: 125 mg/dL (ref 0–200)
HDL: 36 mg/dL — ABNORMAL LOW (ref >40)
LDL Cholesterol: 75 mg/dL (ref 0–99)
Total CHOL/HDL Ratio: 3.5 RATIO
Triglycerides: 68 mg/dL (ref <150)
VLDL: 14 mg/dL (ref 0–40)

## 2019-03-02 LAB — CBC
HCT: 48.8 % (ref 39.0–52.0)
Hemoglobin: 15.4 g/dL (ref 13.0–17.0)
MCH: 29.4 pg (ref 26.0–34.0)
MCHC: 31.6 g/dL (ref 30.0–36.0)
MCV: 93.3 fL (ref 80.0–100.0)
Platelets: 146 10*3/uL — ABNORMAL LOW (ref 150–400)
RBC: 5.23 MIL/uL (ref 4.22–5.81)
RDW: 12.5 % (ref 11.5–15.5)
WBC: 5.3 10*3/uL (ref 4.0–10.5)
nRBC: 0 % (ref 0.0–0.2)

## 2019-03-03 ENCOUNTER — Other Ambulatory Visit (HOSPITAL_COMMUNITY): Payer: Medicare Other

## 2019-03-03 ENCOUNTER — Other Ambulatory Visit: Payer: Self-pay

## 2019-03-03 ENCOUNTER — Encounter (HOSPITAL_COMMUNITY): Payer: Self-pay

## 2019-03-03 ENCOUNTER — Other Ambulatory Visit (HOSPITAL_COMMUNITY)
Admission: RE | Admit: 2019-03-03 | Discharge: 2019-03-03 | Disposition: A | Discharge status: Home / Self Care | Payer: Medicare Other | Source: Ambulatory Visit | Attending: Urology | Admitting: Urology

## 2019-03-03 ENCOUNTER — Encounter (HOSPITAL_COMMUNITY)
Admission: RE | Admit: 2019-03-03 | Discharge: 2019-03-03 | Disposition: A | Discharge status: Home / Self Care | Payer: Medicare Other | Source: Ambulatory Visit | Attending: Urology | Admitting: Urology

## 2019-03-03 DIAGNOSIS — Z20828 Contact with and (suspected) exposure to other viral communicable diseases: Secondary | ICD-10-CM | POA: Insufficient documentation

## 2019-03-03 DIAGNOSIS — Z01812 Encounter for preprocedural laboratory examination: Secondary | ICD-10-CM | POA: Diagnosis not present

## 2019-03-03 HISTORY — DX: Essential (primary) hypertension: I10

## 2019-03-03 LAB — VITAMIN D 25 HYDROXY (VIT D DEFICIENCY, FRACTURES): Vit D, 25-Hydroxy: 71.4 ng/mL (ref 30.0–100.0)

## 2019-03-03 LAB — HEMOGLOBIN A1C
Hgb A1c MFr Bld: 5.7 % — ABNORMAL HIGH (ref 4.8–5.6)
Mean Plasma Glucose: 117 mg/dL

## 2019-03-03 NOTE — Progress Notes (Signed)
PCP - Dr. Jaclynn Guarneri Cardiologist - Dr. Court Joy  Chest x-ray - no EKG -12/28/18  Stress Test - no ECHO - 09/10/17 Cardiac Cath - yes  Sleep Study - no CPAP -   Fasting Blood Sugar - doesn't know Checks Blood Sugar _0____ times a day  Blood Thinner Instructions:ASA [name] Kitchen [name] told him he can continue to take it. Aspirin Instructions: Last Dose:03/02/19  Anesthesia review:   Patient denies shortness of breath, fever, cough and chest pain at PAT appointment yes  Patient verbalized understanding of instructions that were given to them at the PAT appointment. Patient was also instructed that they will need to review over the PAT instructions again at home before surgery. yes

## 2019-03-04 LAB — NOVEL CORONAVIRUS, NAA (HOSP ORDER, SEND-OUT TO REF LAB; TAT 18-24 HRS): SARS-CoV-2, NAA: NOT DETECTED

## 2019-03-06 NOTE — Progress Notes (Signed)
Anesthesia Chart Review   Case: [id number] Date/Time: 03/07/19 1014   Procedures:      CYSTOSCOPY RIGHT RETROGRADE RIGHT URETEROSCOPY /HOLMIUM LASER/STENT PLACEMENT (Right )     TRANSURETHRAL RESECTION OF THE PROSTATE (TURP) (N/A )   Anesthesia type: General   Pre-op diagnosis: RIGHT DISTAL STONE, BENIGN PROSTATE HYPERPLASIA WITH BLADDER OUTLET OBSTRUCTION   Location: [facility]   Surgeon: [name], MD      DISCUSSION:83 y.o. never smoker with h/o HLD, thrombocytopenia (platelets 03/02/2019 146; stable), migraines, CAD (s/p CABG 1998 with DES to [place] in 2004, restonsis by cath in 2009 with medical management recommended), HTN, GERD, RBBB, right distal stone, BPH with outlet obstruction scheduled for above procedure 03/07/2019 with Dr. [name].   Pt last seen by cardiology 12/28/2018.  Seen By Bernerd Pho, PA-C.  Per OV note most recent ischemic evaluation in 2016 low risk, BP well controlled.  "He has been staying active at home and denies any recent chest pain, dyspnea on exertion, palpitations, orthopnea, PND, or lower extremity edema. Typically walks 2-3 miles per day but has not done this in a few weeks due to the temperatures. He has a fascinating sleep schedule and does not go to bed until 0400 and sleeps in until approximately 1100. Says he has been doing this for several years."  1 year follow up recommended.   Anticipate pt can proceed with planned procedure barring acute status change.   VS: BP 133/60   Pulse 71   Temp 37.1 C (Oral)   Resp 18   Ht 6' (1.829 m)   Wt 77.3 kg   SpO2 100%   BMI 23.12 kg/m   PROVIDERS: [name], MD is PCP   Kate Sable, MD is Cardiologist  LABS: Labs reviewed: Acceptable for surgery. (all labs ordered are listed, but only abnormal results are displayed)  Labs Reviewed - No data to display   IMAGES:   EKG: 12/28/2018 Rate 65 bpm Sinus rhythm occasional PAC Right bundle branch block and right  axis-possible right ventricular hypertrophy  Old inferior infarct  CV: Echo 09/10/2017 Study Conclusions  - Left ventricle: The cavity size was normal. Wall thickness was   increased in a pattern of mild LVH. Systolic function was normal.   The estimated ejection fraction was in the range of 55% to 60%.   Wall motion was normal; there were no regional wall motion   abnormalities. Doppler parameters are consistent with abnormal   left ventricular relaxation (grade 1 diastolic dysfunction).   Doppler parameters are consistent with high ventricular filling   pressure. - Aortic valve: Moderately calcified annulus. Trileaflet;   moderately thickened leaflets. There was mild regurgitation.   Valve area (VTI): 2.6 cm^2. Valve area (Vmax): 2.25 cm^2. Valve   area (Vmean): 1.81 cm^2. - Mitral valve: Moderately calcified annulus. Mildly thickened   leaflets . - Left atrium: The atrium was mildly to moderately dilated. - Technically adequate study. Past Medical History:  Diagnosis Date  . Arteriosclerotic cardiovascular disease (ASCVD)    Coronary artery bypass graft surgery in 12/98; DES to the RCA SVG in 07/2002; restenosis of the saphenous vein graft stent and renal intervention in 2006; 70% first diagonal present; calf in 11/2007-total obstruction of the saphenous vein graft to the first marginal  . Benign prostatic hypertrophy   . Cerebrovascular disease    60% bilateral internal carotid artery stenosis in 2012  . Coronary artery disease    4 stents  . Diverticulosis   .  DJD (degenerative joint disease)   . Erectile dysfunction   . GERD (gastroesophageal reflux disease)   . History of blood transfusion   . History of kidney stones   . Hyperlipidemia    Lipid profile in 12/2009:104, 91, 36, 50  . Hypertension   . Migraine headache   . Right bundle branch block   . Shortness of breath   . Thrombocytopenia (HCC)    platelets of 104 in 03/2009  . Upper gastrointestinal hemorrhage  123456    Helicobacter pylori positive; subsequent laparoscopic fundoplication-2009    Past Surgical History:  Procedure Laterality Date  . CARDIAC CATHETERIZATION     4 cardiac stents  . CATARACT EXTRACTION W/PHACO Right 07/25/2012   Procedure: CATARACT EXTRACTION PHACO AND INTRAOCULAR LENS PLACEMENT (IOC);  Surgeon: Tonny Branch, MD;  Location: [facility];  Service: Ophthalmology;  Laterality: Right;  CDE:23.31  . CATARACT EXTRACTION W/PHACO Left 08/18/2012   Procedure: CATARACT EXTRACTION PHACO AND INTRAOCULAR LENS PLACEMENT (IOC);  Surgeon: Tonny Branch, MD;  Location: [facility];  Service: Ophthalmology;  Laterality: Left;  CDE: 16.07  . COLONOSCOPY N/A 08/01/2014   Procedure: COLONOSCOPY;  Surgeon: Rogene Houston, MD;  Location: [facility];  Service: Endoscopy;  Laterality: N/A;  125 - moved to 10:30 - Ann to notify pt  . CORONARY ARTERY BYPASS GRAFT  1998   . CYSTOSCOPY/URETEROSCOPY/HOLMIUM LASER/STENT PLACEMENT Right 11/12/2017   Procedure: CYSTOSCOPY/RIGHT RETROGRADE PYELOGRAM/RIGHT URETEROSCOPY/HOLMIUM LASER LITHOTRIPSY RIGHT URETERAL CALCULUS/RIGHT URETERAL STENT PLACEMENT;  Surgeon: [name], MD;  Location: [facility];  Service: Urology;  Laterality: Right;  right ureteral calculus given to patient's wife per Dr. [name]  . EXTRACORPOREAL SHOCK WAVE LITHOTRIPSY Right 09/13/2017   Procedure: RIGHT EXTRACORPOREAL SHOCK WAVE LITHOTRIPSY (ESWL);  Surgeon: Lucas Mallow, MD;  Location: [facility];  Service: Urology;  Laterality: Right;  . HERNIA REPAIR     right inguinal  . LAPAROSCOPIC NISSEN FUNDOPLICATION  0000000, [id number]    revision in 2009 along with repair of paraesophagel hernia   . PARAESOPHAGEAL HERNIA REPAIR      MEDICATIONS: . aspirin (ASPIRIN LOW DOSE) 81 MG tablet  . atorvastatin (LIPITOR) 20 MG tablet  . carvedilol (COREG) 12.5 MG tablet  . Cholecalciferol (VITAMIN D3) 50 MCG (2000 UT) TABS  . FIBER PO  . loperamide (IMODIUM A-D) 2 MG tablet  . metFORMIN (GLUCOPHAGE) 500 MG tablet  .  ondansetron (ZOFRAN) 4 MG tablet  . promethazine (PHENERGAN) 25 MG tablet  . silodosin (RAPAFLO) 8 MG CAPS capsule  . traMADol (ULTRAM) 50 MG tablet   No current facility-administered medications for this encounter.     Maia Plan [facility] Pre-Surgical Testing [phone] 03/06/19 1:15 PM

## 2019-03-06 NOTE — H&P (Signed)
CC: I have ureteral stone.  HPI: Jorge Bishop is a 83 year-old male established patient who is here for ureteral stone.  The problem is on the right side.   Jorge Bishop returns today in f/u for his history of stones. He had a KUB and [place] prior to this visit and the 4-5 mm stone in the area of the right distal ureter is unchanged. he has no hydro on the [place]. He has some pain that began in the first of the year that is in the right low back and in the right groin. He continues to have urgency and frequency. He has no hematuria. He has no nausea. He remains on silodosin. He can have some dribbling UUI. He has no penile pain. His UA is unremarkable. He had ESWL for a 77m RUPJ stone last year and then had a ureteroscopy for a retained distal fragment. He has no associated signs or symptoms. His IPSS is 18.      ALLERGIES: Bactrim Clarithromycin Codeine Erythromycin Sulfa Sulfa-methoxazole    MEDICATIONS: Aspirin 81 mg tablet,chewable  Metformin Hcl  Omeprazole 20 mg capsule,delayed release  Tamsulosin Hcl 0.4 mg capsule 1 capsule PO Daily  Atorvastatin Calcium 20 mg tablet  Calcium Polycarbophil 625 mg tablet  Carvedilol 6.25 mg tablet  Loperamide 2 mg tablet  Ondansetron Hcl 4 mg tablet  Promethazine Hcl 1 PO Daily  Silodosin 8 mg capsule 1 capsule PO Daily take med with a meal  Vitamin D3 125 mcg/ml (5,000 unit/ml) drops     GU PSH: Complex Uroflow - 07/15/2018 ESWL - 09/13/2017     NON-GU PSH: Back surgery - about 01/13/2018 CABG (coronary artery bypass grafting) - about 1998 Cardiac Stent Placement - about 2016     GU PMH: Ureteral calculus, His right renal stone has moved to the right UVJ. He is minimally symptomatic so I will get a KUB and renal UKoreain 3 weeks and have him f/u with results. - 01/13/2019, He has done well post ureteroscopy and his stent is out. , - 12/03/2017, He has a large distal fragment and I discussed MET, URS and ESWL. He would like to have URS. I have reviewed the  risks of ureteroscopy including bleeding, infection, ureteral injury, need for a stent or secondary procedures, thrombotic events and anesthetic complications. I will try to get it on at [facility] next week. , - 11/05/2017, He has a 6.5 x 437mRUVJ fragment but no symptoms and he has passed a lot of stone. I will have him return in 4-6 weeks with a KUB and renal [place], - 10/15/2017 BPH w/LUTS (Stable), He has stable LUTS on silodosin and wants to stay with that for now. F/u in 6 months. - 07/15/2018, - 06/03/2018 Weak Urinary Stream (Stable) - 07/15/2018 Renal calculus, He has a stable RUP stone. - 06/03/2018, He has a 3.67m53mUP stone and will return in 6 months for a KUB. I discussed diet and hydration. , - 12/03/2017 Urge incontinence, He has progressive LUTS with OAB. I am going to give him a script for silodosin to try instead of the tamsulosin. He will return in 4-6 weeks for a flowrate. He may be intersted in a Urolift. - 06/03/2018      PMH Notes: Stenosis Lumbar   NON-GU PMH: Glycosuria, He has 3+ glucose in the urine but reports he is borderline diabetic. - 12/03/2017 GERD Heart disease, unspecified    FAMILY HISTORY: heart - Father, Son   SOCIAL HISTORY: Marital Status:  Married Current Smoking Status: Patient has never smoked.   Tobacco Use Assessment Completed: Used Tobacco in last 30 days? Has never drank.  Drinks 2 caffeinated drinks per day.    REVIEW OF SYSTEMS:    GU Review Male:   Patient reports frequent urination and hard to postpone urination. Patient denies burning/ pain with urination, get up at night to urinate, leakage of urine, stream starts and stops, trouble starting your stream, have to strain to urinate , erection problems, and penile pain.  Gastrointestinal (Upper):   Patient denies nausea, vomiting, and indigestion/ heartburn.  Gastrointestinal (Lower):   Patient denies diarrhea and constipation.  Constitutional:   Patient denies fever, night sweats, weight loss, and  fatigue.  Skin:   Patient denies skin rash/ lesion and itching.  Eyes:   Patient denies blurred vision and double vision.  Ears/ Nose/ Throat:   Patient denies sore throat and sinus problems.  Hematologic/Lymphatic:   Patient denies swollen glands and easy bruising.  Cardiovascular:   Patient denies leg swelling and chest pains.  Respiratory:   Patient denies cough and shortness of breath.  Endocrine:   Patient denies excessive thirst.  Musculoskeletal:   Patient denies back pain and joint pain.  Neurological:   Patient denies headaches and dizziness.  Psychologic:   Patient denies anxiety and depression.   VITAL SIGNS:      02/10/2019 01:32 PM  BP 130/77 mmHg  Heart Rate 63 /min  Temperature 97.7 F / 36.5 C   MULTI-SYSTEM PHYSICAL EXAMINATION:    Constitutional: Well-nourished. No physical deformities. Normally developed. Good grooming.  Respiratory: Normal breath sounds. No labored breathing, no use of accessory muscles.   Cardiovascular: Regular rate and rhythm. No murmur, no gallop.      PAST DATA REVIEWED:  Source Of History:  Patient  Records Review:   AUA Symptom Score  Urine Test Review:   Urinalysis  X-Ray Review: KUB: Reviewed Films. Reviewed Report. Discussed With Patient.  Renal Ultrasound: Reviewed Films. Reviewed Report. Discussed With Patient.     PROCEDURES:          Urinalysis - 81003 Dipstick Dipstick Cont'd  Specimen: Voided Bilirubin: Neg  Color: Yellow Ketones: Neg  Appearance: Clear Blood: Neg  Specific Gravity: 1.025 Protein: Neg  pH: 5.0 Urobilinogen: 0.2  Glucose: Neg Nitrites: Neg    Leukocyte Esterase: Neg    ASSESSMENT:      ICD-10 Details  1 GU:   Ureteral calculus - N20.1 The stone hasn't moved and he would like to have ureteroscopy. I have reviewed the risks of ureteroscopy including bleeding, infection, ureteral injury, need for a stent or secondary procedures, thrombotic events and anesthetic complications.   2   BPH w/LUTS - N40.1 He  has moderate LUTS with an 2m prostate. I discussed additional meds, Urolift, Rezum and TURP and will get him set up for a TURP. I reviewd the risks of a TURP including bleeding, infection, incontinence, stricture, need for secondary procedures, ejaculatory and erectile dysfunction, thrombotic events, fluid overload and anesthetic complications. I explained that 95% of men will have relief of the obstructive symptoms and about 70% will have relief of the irritative symptoms.   3   Urge incontinence - N39.41   4   Weak Urinary Stream - R39.12    PLAN:           Schedule Return Visit/Planned Activity: Next Available Appointment - Schedule Surgery

## 2019-03-06 NOTE — Anesthesia Preprocedure Evaluation (Addendum)
Anesthesia Evaluation  Patient identified by MRN, date of birth, ID band Patient awake    Reviewed: Allergy & Precautions, NPO status , Patient's Chart, lab work & pertinent test results  Airway Mallampati: II  TM Distance: >3 FB Neck ROM: Full    Dental no notable dental hx.    Pulmonary neg pulmonary ROS,    Pulmonary exam normal breath sounds clear to auscultation       Cardiovascular hypertension, Pt. on medications + CAD  Normal cardiovascular exam Rhythm:Regular Rate:Normal     Neuro/Psych  Headaches, negative psych ROS   GI/Hepatic Neg liver ROS, GERD  ,  Endo/Other  diabetes  Renal/GU negative Renal ROS  negative genitourinary   Musculoskeletal  (+) Arthritis , Osteoarthritis,    Abdominal   Peds negative pediatric ROS (+)  Hematology negative hematology ROS (+)   Anesthesia Other Findings   Reproductive/Obstetrics negative OB ROS                            Anesthesia Physical Anesthesia Plan  ASA: III  Anesthesia Plan: General   Post-op Pain Management:    Induction: Intravenous  PONV Risk Score and Plan: 2 and Ondansetron, Midazolam and Treatment may vary due to age or medical condition  Airway Management Planned: LMA  Additional Equipment:   Intra-op Plan:   Post-operative Plan: Extubation in OR  Informed Consent: I have reviewed the patients History and Physical, chart, labs and discussed the procedure including the risks, benefits and alternatives for the proposed anesthesia with the patient or authorized representative who has indicated his/her understanding and acceptance.     Dental advisory given  Plan Discussed with: CRNA  Anesthesia Plan Comments: (See PAT note 03/03/2019, Konrad Felix, PA-C)       Anesthesia Quick Evaluation

## 2019-03-07 ENCOUNTER — Other Ambulatory Visit: Payer: Self-pay

## 2019-03-07 ENCOUNTER — Ambulatory Visit (HOSPITAL_COMMUNITY): Payer: Medicare Other

## 2019-03-07 ENCOUNTER — Inpatient Hospital Stay (HOSPITAL_COMMUNITY)
Admission: RE | Admit: 2019-03-07 | Discharge: 2019-03-09 | DRG: 713 | Disposition: A | Discharge status: Home / Self Care | Payer: Medicare Other | Attending: Urology | Admitting: Urology

## 2019-03-07 ENCOUNTER — Encounter (HOSPITAL_COMMUNITY): Payer: Self-pay | Admitting: Certified Registered Nurse Anesthetist

## 2019-03-07 ENCOUNTER — Ambulatory Visit (HOSPITAL_COMMUNITY): Payer: Medicare Other | Admitting: Physician Assistant

## 2019-03-07 ENCOUNTER — Ambulatory Visit (HOSPITAL_COMMUNITY): Payer: Medicare Other | Admitting: Certified Registered Nurse Anesthetist

## 2019-03-07 ENCOUNTER — Encounter (HOSPITAL_COMMUNITY)
Admission: RE | Disposition: A | Discharge status: Home / Self Care | Payer: Self-pay | Source: Home / Self Care | Attending: Urology

## 2019-03-07 DIAGNOSIS — R3912 Poor urinary stream: Secondary | ICD-10-CM | POA: Diagnosis present

## 2019-03-07 DIAGNOSIS — Z882 Allergy status to sulfonamides status: Secondary | ICD-10-CM

## 2019-03-07 DIAGNOSIS — N401 Enlarged prostate with lower urinary tract symptoms: Principal | ICD-10-CM | POA: Diagnosis present

## 2019-03-07 DIAGNOSIS — N202 Calculus of kidney with calculus of ureter: Secondary | ICD-10-CM | POA: Diagnosis present

## 2019-03-07 DIAGNOSIS — Z23 Encounter for immunization: Secondary | ICD-10-CM

## 2019-03-07 DIAGNOSIS — E785 Hyperlipidemia, unspecified: Secondary | ICD-10-CM | POA: Diagnosis present

## 2019-03-07 DIAGNOSIS — N138 Other obstructive and reflux uropathy: Secondary | ICD-10-CM | POA: Diagnosis present

## 2019-03-07 DIAGNOSIS — Z87442 Personal history of urinary calculi: Secondary | ICD-10-CM

## 2019-03-07 DIAGNOSIS — R81 Glycosuria: Secondary | ICD-10-CM | POA: Diagnosis present

## 2019-03-07 DIAGNOSIS — Z7982 Long term (current) use of aspirin: Secondary | ICD-10-CM

## 2019-03-07 DIAGNOSIS — N201 Calculus of ureter: Secondary | ICD-10-CM | POA: Diagnosis present

## 2019-03-07 DIAGNOSIS — I679 Cerebrovascular disease, unspecified: Secondary | ICD-10-CM | POA: Diagnosis present

## 2019-03-07 DIAGNOSIS — Z79899 Other long term (current) drug therapy: Secondary | ICD-10-CM

## 2019-03-07 DIAGNOSIS — I10 Essential (primary) hypertension: Secondary | ICD-10-CM | POA: Diagnosis present

## 2019-03-07 DIAGNOSIS — Z885 Allergy status to narcotic agent status: Secondary | ICD-10-CM

## 2019-03-07 DIAGNOSIS — R338 Other retention of urine: Secondary | ICD-10-CM | POA: Diagnosis not present

## 2019-03-07 DIAGNOSIS — Z881 Allergy status to other antibiotic agents status: Secondary | ICD-10-CM

## 2019-03-07 DIAGNOSIS — K219 Gastro-esophageal reflux disease without esophagitis: Secondary | ICD-10-CM | POA: Diagnosis present

## 2019-03-07 DIAGNOSIS — I2510 Atherosclerotic heart disease of native coronary artery without angina pectoris: Secondary | ICD-10-CM | POA: Diagnosis present

## 2019-03-07 DIAGNOSIS — N529 Male erectile dysfunction, unspecified: Secondary | ICD-10-CM | POA: Diagnosis present

## 2019-03-07 DIAGNOSIS — Z951 Presence of aortocoronary bypass graft: Secondary | ICD-10-CM

## 2019-03-07 DIAGNOSIS — N3941 Urge incontinence: Secondary | ICD-10-CM | POA: Diagnosis present

## 2019-03-07 DIAGNOSIS — N320 Bladder-neck obstruction: Secondary | ICD-10-CM | POA: Diagnosis present

## 2019-03-07 HISTORY — PX: TRANSURETHRAL RESECTION OF PROSTATE: SHX73

## 2019-03-07 HISTORY — PX: CYSTOSCOPY/URETEROSCOPY/HOLMIUM LASER/STENT PLACEMENT: SHX6546

## 2019-03-07 LAB — GLUCOSE, CAPILLARY
Glucose-Capillary: 98 mg/dL (ref 70–99)
Glucose-Capillary: 98 mg/dL (ref 70–99)
Glucose-Capillary: 141 mg/dL — ABNORMAL HIGH (ref 70–99)
Glucose-Capillary: 144 mg/dL — ABNORMAL HIGH (ref 70–99)

## 2019-03-07 SURGERY — CYSTOSCOPY/URETEROSCOPY/HOLMIUM LASER/STENT PLACEMENT
Anesthesia: General | Laterality: Right

## 2019-03-07 MED ORDER — DOCUSATE SODIUM 100 MG PO CAPS
100.0000 mg | ORAL_CAPSULE | Freq: Two times a day (BID) | ORAL | Status: DC
  Administered 2019-03-07 – 2019-03-09 (×3): 100 mg via ORAL
  Filled 2019-03-07 (×4): qty 1

## 2019-03-07 MED ORDER — PROMETHAZINE HCL 25 MG/ML IJ SOLN
6.2500 mg | INTRAMUSCULAR | Status: DC | PRN
  Administered 2019-03-07: 6.25 mg via INTRAVENOUS

## 2019-03-07 MED ORDER — HYDROMORPHONE HCL 1 MG/ML IJ SOLN
0.2500 mg | INTRAMUSCULAR | Status: DC | PRN
  Administered 2019-03-07 (×4): 0.5 mg via INTRAVENOUS

## 2019-03-07 MED ORDER — PROMETHAZINE HCL 25 MG PO TABS: 25.0000 mg | ORAL_TABLET | Freq: Four times a day (QID) | ORAL | Status: DC | PRN

## 2019-03-07 MED ORDER — OXYCODONE HCL 5 MG PO TABS
5.0000 mg | ORAL_TABLET | ORAL | Status: DC | PRN
  Administered 2019-03-07: 5 mg via ORAL
  Filled 2019-03-07: qty 1

## 2019-03-07 MED ORDER — CEFAZOLIN SODIUM-DEXTROSE 2-4 GM/100ML-% IV SOLN
2.0000 g | INTRAVENOUS | Status: AC
  Administered 2019-03-07: 2 g via INTRAVENOUS
  Filled 2019-03-07: qty 100

## 2019-03-07 MED ORDER — CARVEDILOL 6.25 MG PO TABS
6.2500 mg | ORAL_TABLET | Freq: Two times a day (BID) | ORAL | Status: DC
  Administered 2019-03-07 – 2019-03-09 (×4): 6.25 mg via ORAL
  Filled 2019-03-07 (×4): qty 1

## 2019-03-07 MED ORDER — SODIUM CHLORIDE 0.9 % IR SOLN
3000.0000 mL | Status: DC
  Administered 2019-03-07 – 2019-03-08 (×2): 3000 mL

## 2019-03-07 MED ORDER — ONDANSETRON HCL 4 MG/2ML IJ SOLN
INTRAMUSCULAR | Status: AC
  Filled 2019-03-07: qty 2

## 2019-03-07 MED ORDER — SENNOSIDES-DOCUSATE SODIUM 8.6-50 MG PO TABS: 1.0000 | ORAL_TABLET | Freq: Every evening | ORAL | Status: DC | PRN

## 2019-03-07 MED ORDER — PROPOFOL 10 MG/ML IV BOLUS
INTRAVENOUS | Status: DC | PRN
  Administered 2019-03-07: 120 mg via INTRAVENOUS

## 2019-03-07 MED ORDER — DEXAMETHASONE SODIUM PHOSPHATE 10 MG/ML IJ SOLN
INTRAMUSCULAR | Status: AC
  Filled 2019-03-07: qty 1

## 2019-03-07 MED ORDER — CEPHALEXIN 500 MG PO CAPS
500.0000 mg | ORAL_CAPSULE | Freq: Three times a day (TID) | ORAL | Status: DC
  Administered 2019-03-07 – 2019-03-09 (×6): 500 mg via ORAL
  Filled 2019-03-07 (×6): qty 1

## 2019-03-07 MED ORDER — POTASSIUM CHLORIDE IN NACL 20-0.45 MEQ/L-% IV SOLN
INTRAVENOUS | Status: DC
  Administered 2019-03-07 – 2019-03-08 (×2): via INTRAVENOUS
  Filled 2019-03-07 (×2): qty 1000

## 2019-03-07 MED ORDER — 0.9 % SODIUM CHLORIDE (POUR BTL) OPTIME
TOPICAL | Status: DC | PRN
  Administered 2019-03-07: 1000 mL

## 2019-03-07 MED ORDER — PROPOFOL 10 MG/ML IV BOLUS
INTRAVENOUS | Status: AC
  Filled 2019-03-07: qty 20

## 2019-03-07 MED ORDER — EPHEDRINE 5 MG/ML INJ
INTRAVENOUS | Status: AC
  Filled 2019-03-07: qty 10

## 2019-03-07 MED ORDER — ONDANSETRON HCL 4 MG/2ML IJ SOLN
4.0000 mg | INTRAMUSCULAR | Status: DC | PRN
  Administered 2019-03-07: 4 mg via INTRAVENOUS

## 2019-03-07 MED ORDER — HYDROMORPHONE HCL 1 MG/ML IJ SOLN
INTRAMUSCULAR | Status: AC
  Filled 2019-03-07: qty 1

## 2019-03-07 MED ORDER — OXYCODONE HCL 5 MG/5ML PO SOLN: 5.0000 mg | Freq: Once | ORAL | Status: DC | PRN

## 2019-03-07 MED ORDER — DEXAMETHASONE SODIUM PHOSPHATE 10 MG/ML IJ SOLN
INTRAMUSCULAR | Status: DC | PRN
  Administered 2019-03-07: 5 mg via INTRAVENOUS

## 2019-03-07 MED ORDER — LIDOCAINE 2% (20 MG/ML) 5 ML SYRINGE
INTRAMUSCULAR | Status: DC | PRN
  Administered 2019-03-07: 60 mg via INTRAVENOUS

## 2019-03-07 MED ORDER — FENTANYL CITRATE (PF) 100 MCG/2ML IJ SOLN
INTRAMUSCULAR | Status: DC | PRN
  Administered 2019-03-07 (×4): 25 ug via INTRAVENOUS

## 2019-03-07 MED ORDER — OXYCODONE HCL 5 MG PO TABS: 5.0000 mg | ORAL_TABLET | Freq: Once | ORAL | Status: DC | PRN

## 2019-03-07 MED ORDER — ZOLPIDEM TARTRATE 5 MG PO TABS: 5.0000 mg | ORAL_TABLET | Freq: Every evening | ORAL | Status: DC | PRN

## 2019-03-07 MED ORDER — BISACODYL 10 MG RE SUPP: 10.0000 mg | Freq: Every day | RECTAL | Status: DC | PRN

## 2019-03-07 MED ORDER — LACTATED RINGERS IV SOLN
INTRAVENOUS | Status: DC
  Administered 2019-03-07: 09:00:00 via INTRAVENOUS

## 2019-03-07 MED ORDER — HYOSCYAMINE SULFATE 0.125 MG SL SUBL
0.1250 mg | SUBLINGUAL_TABLET | SUBLINGUAL | Status: DC | PRN
  Filled 2019-03-07: qty 1

## 2019-03-07 MED ORDER — METFORMIN HCL 500 MG PO TABS
500.0000 mg | ORAL_TABLET | Freq: Two times a day (BID) | ORAL | Status: DC
  Administered 2019-03-07 – 2019-03-09 (×4): 500 mg via ORAL
  Filled 2019-03-07 (×4): qty 1

## 2019-03-07 MED ORDER — INFLUENZA VAC A&B SA ADJ QUAD 0.5 ML IM PRSY
0.5000 mL | PREFILLED_SYRINGE | INTRAMUSCULAR | Status: AC
  Administered 2019-03-08: 0.5 mL via INTRAMUSCULAR
  Filled 2019-03-07: qty 0.5

## 2019-03-07 MED ORDER — LIDOCAINE 2% (20 MG/ML) 5 ML SYRINGE
INTRAMUSCULAR | Status: AC
  Filled 2019-03-07: qty 5

## 2019-03-07 MED ORDER — PROMETHAZINE HCL 25 MG/ML IJ SOLN
INTRAMUSCULAR | Status: AC
  Filled 2019-03-07: qty 1

## 2019-03-07 MED ORDER — ONDANSETRON HCL 4 MG/2ML IJ SOLN
INTRAMUSCULAR | Status: DC | PRN
  Administered 2019-03-07: 4 mg via INTRAVENOUS

## 2019-03-07 MED ORDER — HYDROMORPHONE HCL 1 MG/ML IJ SOLN
0.5000 mg | INTRAMUSCULAR | Status: DC | PRN
  Administered 2019-03-07 – 2019-03-08 (×3): 1 mg via INTRAVENOUS
  Filled 2019-03-07 (×2): qty 1

## 2019-03-07 MED ORDER — FLEET ENEMA 7-19 GM/118ML RE ENEM: 1.0000 | ENEMA | Freq: Once | RECTAL | Status: DC | PRN

## 2019-03-07 MED ORDER — EPHEDRINE SULFATE-NACL 50-0.9 MG/10ML-% IV SOSY
PREFILLED_SYRINGE | INTRAVENOUS | Status: DC | PRN
  Administered 2019-03-07 (×6): 5 mg via INTRAVENOUS

## 2019-03-07 MED ORDER — SODIUM CHLORIDE 0.9 % IV SOLN
INTRAVENOUS | Status: DC | PRN
  Administered 2019-03-07: 10 mL

## 2019-03-07 MED ORDER — ACETAMINOPHEN 325 MG PO TABS: 650.0000 mg | ORAL_TABLET | ORAL | Status: DC | PRN

## 2019-03-07 MED ORDER — ATORVASTATIN CALCIUM 20 MG PO TABS
20.0000 mg | ORAL_TABLET | Freq: Every day | ORAL | Status: DC
  Administered 2019-03-07 – 2019-03-08 (×2): 20 mg via ORAL
  Filled 2019-03-07 (×2): qty 1

## 2019-03-07 MED ORDER — TRAMADOL HCL 50 MG PO TABS
50.0000 mg | ORAL_TABLET | Freq: Four times a day (QID) | ORAL | Status: DC | PRN
  Administered 2019-03-09: 50 mg via ORAL
  Filled 2019-03-07: qty 1

## 2019-03-07 MED ORDER — FENTANYL CITRATE (PF) 100 MCG/2ML IJ SOLN
INTRAMUSCULAR | Status: AC
  Filled 2019-03-07: qty 2

## 2019-03-07 MED ORDER — SODIUM CHLORIDE 0.9 % IR SOLN
Status: DC | PRN
  Administered 2019-03-07: 18000 mL via INTRAVESICAL

## 2019-03-07 MED ORDER — INSULIN ASPART 100 UNIT/ML ~~LOC~~ SOLN
0.0000 [IU] | Freq: Three times a day (TID) | SUBCUTANEOUS | Status: DC
  Administered 2019-03-07: 2 [IU] via SUBCUTANEOUS

## 2019-03-07 SURGICAL SUPPLY — 34 items
BAG URINE DRAINAGE (UROLOGICAL SUPPLIES) IMPLANT
BAG URO CATCHER STRL LF (MISCELLANEOUS) ×3 IMPLANT
BASKET STONE NCOMPASS (UROLOGICAL SUPPLIES) IMPLANT
CATH FOLEY 3WAY 30CC 22FR (CATHETERS) ×1 IMPLANT
CATH INTERMIT  6FR 70CM (CATHETERS) ×1 IMPLANT
CATH URET 5FR 28IN OPEN ENDED (CATHETERS) IMPLANT
CATH URET DUAL LUMEN 6-10FR 50 (CATHETERS) ×3 IMPLANT
CLOTH BEACON ORANGE TIMEOUT ST (SAFETY) ×3 IMPLANT
COVER WAND RF STERILE (DRAPES) IMPLANT
ELECT REM PT RETURN 15FT ADLT (MISCELLANEOUS) ×3 IMPLANT
EXTRACTOR STONE NITINOL NGAGE (UROLOGICAL SUPPLIES) ×3 IMPLANT
FIBER LASER FLEXIVA 1000 (UROLOGICAL SUPPLIES) IMPLANT
FIBER LASER FLEXIVA 365 (UROLOGICAL SUPPLIES) IMPLANT
FIBER LASER FLEXIVA 550 (UROLOGICAL SUPPLIES) IMPLANT
FIBER LASER TRAC TIP (UROLOGICAL SUPPLIES) IMPLANT
GLOVE SURG SS PI 8.0 STRL IVOR (GLOVE) IMPLANT
GOWN STRL REUS W/TWL XL LVL3 (GOWN DISPOSABLE) ×3 IMPLANT
GUIDEWIRE STR DUAL SENSOR (WIRE) ×3 IMPLANT
HOLDER FOLEY CATH W/STRAP (MISCELLANEOUS) IMPLANT
IV NS 1000ML (IV SOLUTION) ×3
IV NS 1000ML BAXH (IV SOLUTION) ×2 IMPLANT
IV NS IRRIG 3000ML ARTHROMATIC (IV SOLUTION) ×3 IMPLANT
KIT BALLN UROMAX 15FX4 (MISCELLANEOUS) IMPLANT
KIT BALLN UROMAX 26 75X4 (MISCELLANEOUS) ×1
KIT TURNOVER KIT A (KITS) IMPLANT
LOOP CUT BIPOLAR 24F LRG (ELECTROSURGICAL) ×1 IMPLANT
MANIFOLD NEPTUNE II (INSTRUMENTS) ×3 IMPLANT
PACK CYSTO (CUSTOM PROCEDURE TRAY) ×3 IMPLANT
SET ASPIRATION TUBING (TUBING) ×3 IMPLANT
SHEATH URETERAL 12FRX35CM (MISCELLANEOUS) ×3 IMPLANT
SYR 30ML LL (SYRINGE) IMPLANT
SYRINGE IRR TOOMEY STRL 70CC (SYRINGE) IMPLANT
TUBING CONNECTING 10 (TUBING) ×3 IMPLANT
TUBING UROLOGY SET (TUBING) ×3 IMPLANT

## 2019-03-07 NOTE — Anesthesia Postprocedure Evaluation (Signed)
Anesthesia Post Note  Patient: Jorge Bishop  Procedure(s) Performed: CYSTOSCOPY RIGHT RETROGRADE RIGHT URETEROSCOPY (Right ) TRANSURETHRAL RESECTION OF THE PROSTATE (TURP) (N/A )     Patient location during evaluation: PACU Anesthesia Type: General Level of consciousness: awake and alert Pain management: pain level controlled Vital Signs Assessment: post-procedure vital signs reviewed and stable Respiratory status: spontaneous breathing, nonlabored ventilation and respiratory function stable Cardiovascular status: blood pressure returned to baseline and stable Postop Assessment: no apparent nausea or vomiting Anesthetic complications: no    Last Vitals:  Vitals:   03/07/19 1115 03/07/19 1130  BP: 139/71 (!) 142/64  Pulse: 68   Resp: 11   Temp:  36.4 C  SpO2: 100%     Last Pain:  Vitals:   03/07/19 1126  TempSrc:   PainSc: [place]

## 2019-03-07 NOTE — Progress Notes (Signed)
Assumed care of patient at 1530. Condition stable. Cont with plan of care.

## 2019-03-07 NOTE — Transfer of Care (Signed)
Immediate Anesthesia Transfer of Care Note  Patient: ROSENDO CONIGLIO  Procedure(s) Performed: CYSTOSCOPY RIGHT RETROGRADE RIGHT URETEROSCOPY (Right ) TRANSURETHRAL RESECTION OF THE PROSTATE (TURP) (N/A )  Patient Location: PACU  Anesthesia Type:General  Level of Consciousness: awake, alert  and oriented  Airway & Oxygen Therapy: Patient Spontanous Breathing and Patient connected to face mask oxygen  Post-op Assessment: Report given to RN and Post -op Vital signs reviewed and stable  Post vital signs: Reviewed and stable  Last Vitals:  Vitals Value Taken Time  BP    Temp    Pulse 71 03/07/19 1113  Resp    SpO2 100 % 03/07/19 1113  Vitals shown include unvalidated device data.  Last Pain:  Vitals:   03/07/19 0907  TempSrc:   PainSc: 0-No pain         Complications: No apparent anesthesia complications

## 2019-03-07 NOTE — Anesthesia Procedure Notes (Signed)
Procedure Name: LMA Insertion Date/Time: 03/07/2019 9:52 AM Performed by: Maxwell Caul, CRNA Pre-anesthesia Checklist: Patient identified, Emergency Drugs available, Suction available, Patient being monitored and Timeout performed Patient Re-evaluated:Patient Re-evaluated prior to induction Oxygen Delivery Method: Circle system utilized Preoxygenation: Pre-oxygenation with 100% oxygen Induction Type: IV induction LMA: LMA inserted LMA Size: 5.0 Number of attempts: 1 Placement Confirmation: positive ETCO2 and breath sounds checked- equal and bilateral Tube secured with: Tape Dental Injury: Teeth and Oropharynx as per pre-operative assessment

## 2019-03-07 NOTE — Interval H&P Note (Signed)
History and Physical Interval Note:  03/07/2019 9:41 AM  Jorge Bishop  has presented today for surgery, with the diagnosis of RIGHT DISTAL STONE, BENIGN PROSTATE HYPERPLASIA WITH BLADDER OUTLET OBSTRUCTION.  The various methods of treatment have been discussed with the patient and family. After consideration of risks, benefits and other options for treatment, the patient has consented to  Procedure(s): CYSTOSCOPY RIGHT RETROGRADE RIGHT URETEROSCOPY /HOLMIUM LASER/STENT PLACEMENT (Right) TRANSURETHRAL RESECTION OF THE PROSTATE (TURP) (N/A) as a surgical intervention.  The patient's history has been reviewed, patient examined, no change in status, stable for surgery.  I have reviewed the patient's chart and labs.  Questions were answered to the patient's satisfaction.     [name]

## 2019-03-07 NOTE — Discharge Instructions (Addendum)
Transurethral Resection of the Prostate, Care After °This sheet gives you information about how to care for yourself after your procedure. Your health care provider may also give you more specific instructions. If you have problems or questions, contact your health care provider. °What can I expect after the procedure? °After the procedure, it is common to have: °· Mild pain in your lower abdomen. °· Soreness or mild discomfort in your penis from having the catheter inserted during the procedure. °· A feeling of urgency when you need to urinate. °· A small amount of blood in your urine. You may notice some small blood clots in your urine. These are normal. °Follow these instructions at home: °Medicines °· Take over-the-counter and prescription medicines only as told by your health care provider. °· If you were prescribed an antibiotic medicine, take it as told by your health care provider. Do not stop taking the antibiotic even if you start to feel better. °· Ask your health care provider if the medicine prescribed to you: °? Requires you to avoid driving or using heavy machinery. °? Can cause constipation. You may need to take actions to prevent or treat constipation, such as: °§ Take over-the-counter or prescription medicines. °§ Eat foods that are high in fiber, such as fresh fruits and vegetables, whole grains, and beans. °§ Limit foods that are high in fat and processed sugars, such as fried or sweet foods. °· Do not drive for 24 hours if you were given a sedative during your procedure. °Activity ° °· Return to your normal activities as told by your health care provider. Ask your health care provider what activities are safe for you. °· Do not lift anything that is heavier than 10 lb (4.5 kg), or the limit that you are told, for 3 weeks after the procedure or until your health care provider says that it is safe. °· Avoid intense physical activity for as long as told by your health care provider. °· Avoid  sitting for a long time without moving. Get up and move around one or more times every few hours. This helps to prevent blood clots. You may increase your physical activity gradually as you start to feel better. °Lifestyle °· Do not drink alcohol for as long as told by your health care provider. This is especially important if you are taking prescription pain medicines. °· Do not engage in sexual activity until your health care provider says that you can do this. °General instructions ° °· Do not take baths, swim, or use a hot tub until your health care provider approves. °· Drink enough fluid to keep your urine pale yellow. °· Urinate as soon as you feel the need to. Do not try to hold your urine for long periods of time. °· If your health care provider approves, you may take a stool softener for 2-3 weeks to prevent you from straining to have a bowel movement. °· Wear compression stockings as told by your health care provider. These stockings help to prevent blood clots and reduce swelling in your legs. °· Keep all follow-up visits as told by your health care provider. This is important. °Contact a health care provider if you have: °· Difficulty urinating. °· A fever. °· Pain that gets worse or does not improve with medicine. °· Blood in your urine that does not go away after 1 week of resting and drinking more fluids. °· Swelling in your penis or testicles. °Get help right away if: °· You are unable   to urinate.  You are having more blood clots in your urine instead of fewer.  You have: ? Large blood clots. ? A lot of blood in your urine. ? Pain in your back or lower abdomen. ? Pain or swelling in your legs. ? Chills and you are shaking. ? Difficulty breathing or shortness of breath. Summary  After the procedure, it is common to have a small amount of blood in your urine.  Avoid heavy lifting and intense physical activity for as long as told by your health care provider.  Urinate as soon as you  feel the need to. Do not try to hold your urine for long periods of time.  Keep all follow-up visits as told by your health care provider. This is important.  Hold aspirin for 7 more days.   This information is not intended to replace advice given to you by your health care provider. Make sure you discuss any questions you have with your health care provider. Document Released: 06/01/2005 Document Revised: 09/21/2018 Document Reviewed: 03/02/2018 Elsevier Patient Education  2020 [employer].

## 2019-03-07 NOTE — Op Note (Signed)
Procedure: 1.  Cystoscopy with right retrograde pyelogram and interpretation. 2.  Right ureteroscopic stone extraction. 3.  Transurethral resection of the prostate.  Preop diagnosis: 1.  Right distal ureteral stone. 2.  BPH with bladder outlet obstruction.  Postop diagnosis: Same.  Surgeon: Dr. John Wrenn.  Anesthesia: General.  Specimen: Stone and prostate chips.  Drains: 22 French three-way Foley catheter.  EBL: Approximately 200 mL.  Complications: None.  Indications: Jorge Bishop is an 83-year-old male with BPH and bladder outlet obstruction as well as a symptomatic 5 mm right distal ureteral stone.  He is elected undergo ureteroscopy and TURP.  Procedure: He was given 2 g of Ancef.  He was taken operating room where general anesthetic was induced.  He was placed in lithotomy position and fitted with PAS hose.  His perineum and genitalia were prepped with Betadine solution he was draped in usual sterile fashion.  Cystoscopy was performed using a 23 French scope and 30 degree lens.  Examination revealed a normal urethra.  The external sphincter was intact.  The prostatic urethra was approximately 4 cm in length with bilobar hyperplasia and obstruction.  The bladder wall had moderate trabeculation with small cellules.  No mucosal lesions were noted.  Some very fine grit was noted at the base of the bladder but no significant stones.  Ureteral orifices were unremarkable.  The right ureteral orifice was cannulated with a 6 French open-ended catheter and contrast was instilled.  The right retrograde pyelogram revealed a normal caliber ureter up to a filling defect approximately 5 cm above the meatus consistent with his stone.  There was mild proximal dilation but no other filling defects.  A sensor wire was passed to the kidney under fluoroscopic guidance and the open-ended catheter was removed.  A 4 cm x 15 French high-pressure balloon was placed over the wire and positioned just below  the stone.  The balloon was dilated to 18 atm of pressure with no residual waist noted on fluoroscopy.  A second dilation was performed across the meatus.  The balloon was removed and the dual-lumen semirigid ureteroscope was passed alongside the wire.  The stone was visualized.  The stone was grasped with an engage basket and I was able to remove it intact.  Final ureteroscopic inspection of the distal and mid ureter demonstrated no retained stones and no mucosal injury.  It was felt that stenting was not indicated.  The ureteroscope was removed and the 26 French continuous-flow resectoscope sheath was inserted with the visual obturator.  The visual obturator was replaced with an Iglesias handle with a 30 degree lens and bipolar loop.  Saline was used as the irrigant.  The bladder neck was then resected from 5-7 o'clock down to the capsular fibers.  The floor the prostate was resected out to alongside the verumontanum bilaterally.  The left lobe of the prostate was resected from bladder neck to apex out to the capsular fibers.  The right lobe of the prostate was resected from bladder neck to apex and out to the capsular fibers.  Residual apical and anterior tissue was resected as indicated.  The bladder was evacuated free of chips and final hemostasis was achieved.  Final inspection revealed no retained chips, intact ureteral orifices, no active bleeding and an intact external sphincter.  After removal of the scope, pressure on the bladder produced an excellent stream.  A 22 French three-way Foley cath was placed with the aid of a catheter guide.  The balloon was filled   with 30 mL of sterile water.  The catheter was hand irrigated with clear return and then placed to continuous irrigation and straight drainage.  He was taken down from lithotomy position, his anesthetic was reversed and he was moved recovery in stable condition.  There were no complications.  The prostate chips were sent for pathology and the  stone was given to the patient's family to bring to the office for analysis.

## 2019-03-07 NOTE — Progress Notes (Signed)
Pt tolerated a jello and broth. Denies nausea or vomiting. Diet advanced as per order

## 2019-03-08 ENCOUNTER — Encounter (HOSPITAL_COMMUNITY): Payer: Self-pay | Admitting: Urology

## 2019-03-08 DIAGNOSIS — N201 Calculus of ureter: Secondary | ICD-10-CM

## 2019-03-08 HISTORY — DX: Calculus of ureter: N20.1

## 2019-03-08 LAB — GLUCOSE, CAPILLARY
Glucose-Capillary: 105 mg/dL — ABNORMAL HIGH (ref 70–99)
Glucose-Capillary: 98 mg/dL (ref 70–99)
Glucose-Capillary: 124 mg/dL — ABNORMAL HIGH (ref 70–99)
Glucose-Capillary: 88 mg/dL (ref 70–99)

## 2019-03-08 MED ORDER — CALCIUM CARBONATE ANTACID 500 MG PO CHEW
1.0000 | CHEWABLE_TABLET | Freq: Every day | ORAL | Status: DC | PRN
  Administered 2019-03-08: 200 mg via ORAL
  Filled 2019-03-08: qty 1

## 2019-03-08 MED ORDER — CHLORHEXIDINE GLUCONATE CLOTH 2 % EX PADS
6.0000 | MEDICATED_PAD | Freq: Every day | CUTANEOUS | Status: DC
  Administered 2019-03-09: 6 via TOPICAL

## 2019-03-08 NOTE — Progress Notes (Signed)
Foley removed this morning at 0915. Pt voided 4 times between 0915 and 1230 totaling approximately 69ml of red urine. Pt states that it is painful to void and that he does not feel that he is emptying his bladder. Bladder scanner reads 413ml. MD [name] paged. Ordered given to insert 34F foley catheter. Catheter inserted with a return of 611ml of dark red bloody urine.   Will continue to monitor urine output and patient status.

## 2019-03-08 NOTE — Progress Notes (Signed)
MD [name] instructed this RN to hand irrigate foley cath every 4 hours. Catheter hand irrigated twice at [id number] with 16ml of NS with a return of red bloody urine. Urine is currently flowing through  catheter.  Will continue to monitor.

## 2019-03-08 NOTE — Discharge Summary (Signed)
Physician Discharge Summary  Patient ID: Jorge Bishop MRN: [id number] DOB/AGE: [birth year] 83 y.o.  Admit date: 03/07/2019 Discharge date: 03/08/2019  Admission Diagnoses:  BPH with urinary obstruction  Discharge Diagnoses:  Principal Problem:   BPH with urinary obstruction Active Problems:   Right ureteral stone   Past Medical History:  Diagnosis Date  . Arteriosclerotic cardiovascular disease (ASCVD)    Coronary artery bypass graft surgery in 12/98; DES to the RCA SVG in 07/2002; restenosis of the saphenous vein graft stent and renal intervention in 2006; 70% first diagonal present; calf in 11/2007-total obstruction of the saphenous vein graft to the first marginal  . Benign prostatic hypertrophy   . Cerebrovascular disease    60% bilateral internal carotid artery stenosis in 2012  . Coronary artery disease    4 stents  . Diverticulosis   . DJD (degenerative joint disease)   . Erectile dysfunction   . GERD (gastroesophageal reflux disease)   . History of blood transfusion   . History of kidney stones   . Hyperlipidemia    Lipid profile in 12/2009:104, 91, 36, 50  . Hypertension   . Migraine headache   . Right bundle branch block   . Shortness of breath   . Thrombocytopenia (HCC)    platelets of 104 in 03/2009  . Upper gastrointestinal hemorrhage [id number]    Helicobacter pylori positive; subsequent laparoscopic fundoplication-2009    Surgeries: Procedure(s): CYSTOSCOPY RIGHT RETROGRADE RIGHT URETEROSCOPY TRANSURETHRAL RESECTION OF THE PROSTATE (TURP) on 03/07/2019   Consultants (if any):   Discharged Condition: Improved  Hospital Course: Jorge Bishop is an 83 y.o. adult who was admitted 03/07/2019 with a diagnosis of BPH with urinary obstruction and went to the operating room on 03/07/2019 and underwent the above named procedures. He has no flank pain. His urine is light pink on minimal CBI.  His foley was removed and he will be discharged when voiding.    He was given  perioperative antibiotics:  Anti-infectives (From admission, onward)   Start     Dose/Rate Route Frequency Ordered Stop   03/07/19 1600  cephALEXin (KEFLEX) capsule 500 mg     500 mg Oral 3 times daily 03/07/19 1156     03/07/19 0846  ceFAZolin (ANCEF) IVPB 2g/100 mL premix     2 g 200 mL/hr over 30 Minutes Intravenous 30 min pre-op 03/07/19 0846 03/07/19 1013    .  He was given sequential compression devices for DVT prophylaxis.  He benefited maximally from the hospital stay and there were no complications.    Recent vital signs:  Vitals:   03/08/19 0013 03/08/19 0412  BP: 112/69 119/75  Pulse: 74 65  Resp:  16  Temp: 97.7 F (36.5 C) (!) 97.5 F (36.4 C)  SpO2: 100% 98%    Recent laboratory studies:  Lab Results  Component Value Date   HGB 15.4 03/02/2019   HGB 12.4 (L) 11/10/2017   HGB 13.6 09/10/2017   Lab Results  Component Value Date   WBC 5.3 03/02/2019   PLT 146 (L) 03/02/2019   Lab Results  Component Value Date   INR 1.1 03/27/2008   Lab Results  Component Value Date   NA 139 03/02/2019   K 4.7 03/02/2019   CL 106 03/02/2019   CO2 27 03/02/2019   BUN 23 03/02/2019   CREATININE 0.98 03/02/2019   GLUCOSE 112 (H) 03/02/2019    Discharge Medications:   Allergies as of 03/08/2019      Reactions  Clarithromycin Anaphylaxis   REACTION: UNKNOWN REACTION   Codeine Nausea And Vomiting   Erythromycin Anaphylaxis   Sulfa Antibiotics Anaphylaxis      Medication List    TAKE these medications   Aspirin Low Dose 81 MG tablet Generic drug: aspirin Take 81 mg by mouth daily.   atorvastatin 20 MG tablet Commonly known as: LIPITOR Take 20 mg by mouth at bedtime.   carvedilol 12.5 MG tablet Commonly known as: COREG Take 6.25 mg by mouth 2 (two) times daily with a meal.   FIBER PO Take 2 capsules by mouth daily as needed (constipation).   loperamide 2 MG tablet Commonly known as: IMODIUM A-D Take 0.5 tablets (1 mg total) by mouth at  bedtime. What changed:   when to take this  reasons to take this   metFORMIN 500 MG tablet Commonly known as: GLUCOPHAGE Take 500 mg by mouth 2 (two) times daily with a meal.   ondansetron 4 MG tablet Commonly known as: ZOFRAN Take 4 mg by mouth every 8 (eight) hours as needed for nausea or vomiting.   promethazine 25 MG tablet Commonly known as: PHENERGAN Take 25 mg by mouth every 6 (six) hours as needed for nausea or vomiting.   traMADol 50 MG tablet Commonly known as: ULTRAM Take 50 mg by mouth every 6 (six) hours as needed (pain).   Vitamin D3 50 MCG (2000 UT) Tabs Take 10,000 Units by mouth daily.       Diagnostic Studies: Dg Abd 1 View  Result Date: 02/08/2019 CLINICAL DATA:  Ureteral calculus, history of lithotripsy. EXAM: ABDOMEN - 1 VIEW COMPARISON:  Multiple prior abdominal radiographs, most recently 01/09/2019, CT abdomen pelvis September 08, 2017 FINDINGS: No residual calcification is seen over the right kidney however there has been progressive accumulation of calcifications within the right hemipelvis inferior to the right SI joint which is in the expected location of the distal right ureter. Findings may reflect multiple distal ureteral calculi versus superimposed vascular calcification. Several phleboliths are unchanged from prior exams. No calcifications seen over the course of the left urinary tract. Bowel gas pattern is unremarkable. Degenerative changes noted in the spine, SI joints and hips. IMPRESSION: Progressive accumulation of calcifications within the right hemipelvis inferior to the right SI joint which may reflect multiple distal ureteral calculi versus superimposed vascular calcification. Electronically Signed   By: Lovena Le M.D.   On: 02/08/2019 22:08   US Renal  Result Date: 02/08/2019 CLINICAL DATA:  82 year old male with kidney stones. EXAM: RENAL / URINARY TRACT ULTRASOUND COMPLETE COMPARISON:  Renal ultrasound dated 10/27/2017 FINDINGS: Right  Kidney: Renal measurements: 10.8 x 5.8 x 5.7 cm = volume: 188 mL. Normal echogenicity. No hydronephrosis or shadowing stone. Left Kidney: Renal measurements: 11.6 x 6.1 x 5.6 cm = volume: 209 mL. There is a 6 mm echogenic focus in the interpolar left kidney, likely a nonobstructing stone. There is a 1.5 x 1.4 x 1.8 cm interpolar cyst. Probable mild caliectasis of the inferior pole collecting system. Bladder: Appears normal for degree of bladder distention. IMPRESSION: 1. Unremarkable right kidney. 2. A 6 mm nonobstructing left renal calculus. 3. Mild left renal inferior pole caliectasis. Electronically Signed   By: Anner Crete M.D.   On: 02/08/2019 19:23   Dg C-arm 1-60 Min-no Report  Result Date: 03/07/2019 Fluoroscopy was utilized by the requesting physician.  No radiographic interpretation.    Disposition: Discharge disposition: 01-Home or Self Care       Discharge Instructions  Discontinue IV   Complete by: As directed       Follow-up Information    [facility] [place] On 03/24/2019.   Why: 11:15 Contact information: [address] [place] [id number]           Signed: [name] 03/08/2019, 8:10 AM

## 2019-03-08 NOTE — Care Management Obs Status (Signed)
[place] NOTIFICATION   Patient Details  Name: Jorge Bishop MRN: [id number] Date of Birth: 29-Jan-1936   Medicare Observation Status Notification Given:  Yes    MahabirJuliann Pulse, RN 03/08/2019, 3:57 PM

## 2019-03-08 NOTE — Progress Notes (Signed)
Multiple blood clots along with bloody red urine has returned into foley that was placed an hour ago. MD [name] notified and gave verbal orders to hand irrigate as needed and instructed that pt will need to stay another night.  Will continue to monitor.

## 2019-03-08 NOTE — Progress Notes (Signed)
Foley irrigated at this time as ordered. Red bloody urine obtained with small clots. Patient tolerated well.

## 2019-03-08 NOTE — Progress Notes (Signed)
1 Day Post-Op  Subjective: Jorge Bishop is doing well s/p right ureteroscopy and TURP.   He has no complaints. Urine is still pink on slow CBI.  ROS:  Review of Systems  All other systems reviewed and are negative.   Anti-infectives: Anti-infectives (From admission, onward)   Start     Dose/Rate Route Frequency Ordered Stop   03/07/19 1600  cephALEXin (KEFLEX) capsule 500 mg     500 mg Oral 3 times daily 03/07/19 1156     03/07/19 0846  ceFAZolin (ANCEF) IVPB 2g/100 mL premix     2 g 200 mL/hr over 30 Minutes Intravenous 30 min pre-op 03/07/19 0846 03/07/19 1013      Current Facility-Administered Medications  Medication Dose Route Frequency Provider Last Rate Last Dose  . 0.45 % NaCl with KCl 20 mEq / L infusion   Intravenous Continuous [name], MD 100 mL/hr at 03/08/19 0238    . acetaminophen (TYLENOL) tablet 650 mg  650 mg Oral Q4H PRN [name], MD      . atorvastatin (LIPITOR) tablet 20 mg  20 mg Oral QHS [name], MD   20 mg at 03/07/19 2230  . bisacodyl (DULCOLAX) suppository 10 mg  10 mg Rectal Daily PRN [name], MD      . carvedilol (COREG) tablet 6.25 mg  6.25 mg Oral BID WC [name], MD   6.25 mg at 03/07/19 1659  . cephALEXin (KEFLEX) capsule 500 mg  500 mg Oral TID [name], MD   500 mg at 03/07/19 2230  . Chlorhexidine Gluconate Cloth 2 % PADS 6 each  6 each Topical Daily [name], MD      . docusate sodium (COLACE) capsule 100 mg  100 mg Oral BID [name], MD   100 mg at 03/07/19 2230  . HYDROmorphone (DILAUDID) injection 0.5-1 mg  0.5-1 mg Intravenous Q2H PRN [name], MD   1 mg at 03/07/19 2231  . hyoscyamine (LEVSIN SL) SL tablet 0.125 mg  0.125 mg Sublingual Q4H PRN [name], MD      . influenza vaccine adjuvanted (FLUAD) injection 0.5 mL  0.5 mL Intramuscular Tomorrow-1000 [name], MD      . insulin aspart (novoLOG) injection 0-15 Units  0-15 Units Subcutaneous TID WC [name], MD   2 Units at 03/07/19 1707  . metFORMIN (GLUCOPHAGE)  tablet 500 mg  500 mg Oral BID WC [name], MD   500 mg at 03/07/19 1659  . ondansetron (ZOFRAN) injection 4 mg  4 mg Intravenous Q4H PRN [name], MD   4 mg at 03/07/19 1428  . oxyCODONE (Oxy IR/ROXICODONE) immediate release tablet 5 mg  5 mg Oral Q4H PRN [name], MD   5 mg at 03/07/19 1546  . promethazine (PHENERGAN) tablet 25 mg  25 mg Oral Q6H PRN [name], MD      . senna-docusate (Senokot-S) tablet 1 tablet  1 tablet Oral QHS PRN [name], MD      . sodium chloride irrigation 0.9 % 3,000 mL  3,000 mL Irrigation Continuous [name], MD   3,000 mL at 03/08/19 0239  . sodium phosphate (FLEET) 7-19 GM/118ML enema 1 enema  1 enema Rectal Once PRN [name], MD      . traMADol Veatrice Bourbon) tablet 50 mg  50 mg Oral Q6H PRN [name], MD      . zolpidem (AMBIEN) tablet 5 mg  5 mg Oral QHS PRN [name], MD  Objective: Vital signs in last 24 hours: Temp:  [97.5 F (36.4 C)-98 F (36.7 C)] 97.5 F (36.4 C) (09/23 0412) Pulse Rate:  [61-74] 65 (09/23 0412) Resp:  [10-18] 16 (09/23 0412) BP: (112-174)/(51-84) 119/75 (09/23 0412) SpO2:  [98 %-100 %] 98 % (09/23 0412) Weight:  [77.3 kg-78.8 kg] 78.8 kg (09/23 0436)  Intake/Output from previous day: 09/22 0701 - 09/23 0700 In: 9799.6 [I.V.:2199.6; IV Piggyback:100] Out: [id number] [Urine:23375; Blood:50] Intake/Output this shift: Total I/O In: 799.6 [I.V.:799.6] Out: 11050 [Urine:11050]   Physical Exam Vitals signs reviewed.  Constitutional:      Appearance: Normal appearance.  Abdominal:     Palpations: Abdomen is soft.     Tenderness: There is no right CVA tenderness.  Neurological:     Mental Status: He is alert.     Lab Results:  No results for input(s): WBC, HGB, HCT, PLT in the last 72 hours. BMET No results for input(s): NA, K, CL, CO2, GLUCOSE, BUN, CREATININE, CALCIUM in the last 72 hours. PT/INR No results for input(s): LABPROT, INR in the last 72 hours. ABG No results for input(s): PHART,  HCO3 in the last 72 hours.  Invalid input(s): PCO2, PO2  Studies/Results: Dg C-arm 1-60 Min-no Report  Result Date: 03/07/2019 Fluoroscopy was utilized by the requesting physician.  No radiographic interpretation.     Assessment and Plan: BPH with BOO s/p TURP.  Urine is still a little bloody.  I will recheck later today and consider foley removal and d/c.   Right ureteral stone.  No flank pain.       LOS: 0 days    [name] 03/08/2019 [id number] ID: [name], adult   DOB: 12-01-1935, 83 y.o.   MRN: [id number]

## 2019-03-09 DIAGNOSIS — N320 Bladder-neck obstruction: Secondary | ICD-10-CM | POA: Diagnosis present

## 2019-03-09 DIAGNOSIS — Z23 Encounter for immunization: Secondary | ICD-10-CM | POA: Diagnosis present

## 2019-03-09 DIAGNOSIS — N202 Calculus of kidney with calculus of ureter: Secondary | ICD-10-CM | POA: Diagnosis present

## 2019-03-09 DIAGNOSIS — R81 Glycosuria: Secondary | ICD-10-CM | POA: Diagnosis present

## 2019-03-09 DIAGNOSIS — Z79899 Other long term (current) drug therapy: Secondary | ICD-10-CM | POA: Diagnosis not present

## 2019-03-09 DIAGNOSIS — R338 Other retention of urine: Secondary | ICD-10-CM | POA: Diagnosis not present

## 2019-03-09 DIAGNOSIS — R3912 Poor urinary stream: Secondary | ICD-10-CM | POA: Diagnosis present

## 2019-03-09 DIAGNOSIS — Z951 Presence of aortocoronary bypass graft: Secondary | ICD-10-CM | POA: Diagnosis not present

## 2019-03-09 DIAGNOSIS — I2510 Atherosclerotic heart disease of native coronary artery without angina pectoris: Secondary | ICD-10-CM | POA: Diagnosis present

## 2019-03-09 DIAGNOSIS — Z87442 Personal history of urinary calculi: Secondary | ICD-10-CM | POA: Diagnosis not present

## 2019-03-09 DIAGNOSIS — Z7982 Long term (current) use of aspirin: Secondary | ICD-10-CM | POA: Diagnosis not present

## 2019-03-09 DIAGNOSIS — E785 Hyperlipidemia, unspecified: Secondary | ICD-10-CM | POA: Diagnosis present

## 2019-03-09 DIAGNOSIS — K219 Gastro-esophageal reflux disease without esophagitis: Secondary | ICD-10-CM | POA: Diagnosis present

## 2019-03-09 DIAGNOSIS — Z881 Allergy status to other antibiotic agents status: Secondary | ICD-10-CM | POA: Diagnosis not present

## 2019-03-09 DIAGNOSIS — Z882 Allergy status to sulfonamides status: Secondary | ICD-10-CM | POA: Diagnosis not present

## 2019-03-09 DIAGNOSIS — N529 Male erectile dysfunction, unspecified: Secondary | ICD-10-CM | POA: Diagnosis present

## 2019-03-09 DIAGNOSIS — N3941 Urge incontinence: Secondary | ICD-10-CM | POA: Diagnosis present

## 2019-03-09 DIAGNOSIS — N138 Other obstructive and reflux uropathy: Secondary | ICD-10-CM | POA: Diagnosis present

## 2019-03-09 DIAGNOSIS — I10 Essential (primary) hypertension: Secondary | ICD-10-CM | POA: Diagnosis present

## 2019-03-09 DIAGNOSIS — N401 Enlarged prostate with lower urinary tract symptoms: Secondary | ICD-10-CM | POA: Diagnosis present

## 2019-03-09 DIAGNOSIS — R1031 Right lower quadrant pain: Secondary | ICD-10-CM | POA: Diagnosis present

## 2019-03-09 DIAGNOSIS — Z885 Allergy status to narcotic agent status: Secondary | ICD-10-CM | POA: Diagnosis not present

## 2019-03-09 DIAGNOSIS — I679 Cerebrovascular disease, unspecified: Secondary | ICD-10-CM | POA: Diagnosis present

## 2019-03-09 LAB — GLUCOSE, CAPILLARY
Glucose-Capillary: 89 mg/dL (ref 70–99)
Glucose-Capillary: 106 mg/dL — ABNORMAL HIGH (ref 70–99)

## 2019-03-09 NOTE — Progress Notes (Signed)
Patient voided 75 cc, bladder scan completed 38 cc. Discussed the importance of fluids and monitoring sign symptoms of urine retention. Discharge instructions reviewed, questions concerns denied. No change from am assessment.

## 2019-03-09 NOTE — Progress Notes (Signed)
2 Days Post-Op  Subjective: He has dark bloody urine this morning and will need to be irrigated.  ROS:  Review of Systems  All other systems reviewed and are negative.   Anti-infectives: Anti-infectives (From admission, onward)   Start     Dose/Rate Route Frequency Ordered Stop   03/07/19 1600  cephALEXin (KEFLEX) capsule 500 mg     500 mg Oral 3 times daily 03/07/19 1156     03/07/19 0846  ceFAZolin (ANCEF) IVPB 2g/100 mL premix     2 g 200 mL/hr over 30 Minutes Intravenous 30 min pre-op 03/07/19 0846 03/07/19 1013      Current Facility-Administered Medications  Medication Dose Route Frequency Provider Last Rate Last Dose  . 0.45 % NaCl with KCl 20 mEq / L infusion   Intravenous Continuous [name], MD   Stopped at 03/08/19 1003  . acetaminophen (TYLENOL) tablet 650 mg  650 mg Oral Q4H PRN [name], MD      . atorvastatin (LIPITOR) tablet 20 mg  20 mg Oral QHS [name], MD   20 mg at 03/08/19 2141  . bisacodyl (DULCOLAX) suppository 10 mg  10 mg Rectal Daily PRN [name], MD      . calcium carbonate (TUMS - dosed in mg elemental calcium) chewable tablet 200 mg of elemental calcium  1 tablet Oral Daily PRN [name], MD   200 mg of elemental calcium at 03/08/19 1003  . carvedilol (COREG) tablet 6.25 mg  6.25 mg Oral BID WC [name], MD   6.25 mg at 03/08/19 1746  . cephALEXin (KEFLEX) capsule 500 mg  500 mg Oral TID [name], MD   500 mg at 03/08/19 2141  . Chlorhexidine Gluconate Cloth 2 % PADS 6 each  6 each Topical Daily [name], MD      . docusate sodium (COLACE) capsule 100 mg  100 mg Oral BID [name], MD   100 mg at 03/08/19 2141  . HYDROmorphone (DILAUDID) injection 0.5-1 mg  0.5-1 mg Intravenous Q2H PRN [name], MD   1 mg at 03/08/19 2139  . hyoscyamine (LEVSIN SL) SL tablet 0.125 mg  0.125 mg Sublingual Q4H PRN [name], MD      . insulin aspart (novoLOG) injection 0-15 Units  0-15 Units Subcutaneous TID WC [name], MD   2 Units at  03/07/19 1707  . metFORMIN (GLUCOPHAGE) tablet 500 mg  500 mg Oral BID WC [name], MD   500 mg at 03/08/19 1746  . ondansetron (ZOFRAN) injection 4 mg  4 mg Intravenous Q4H PRN [name], MD   4 mg at 03/07/19 1428  . oxyCODONE (Oxy IR/ROXICODONE) immediate release tablet 5 mg  5 mg Oral Q4H PRN [name], MD   5 mg at 03/07/19 1546  . promethazine (PHENERGAN) tablet 25 mg  25 mg Oral Q6H PRN [name], MD      . senna-docusate (Senokot-S) tablet 1 tablet  1 tablet Oral QHS PRN [name], MD      . sodium chloride irrigation 0.9 % 3,000 mL  3,000 mL Irrigation Continuous [name], MD   3,000 mL at 03/08/19 0239  . sodium phosphate (FLEET) 7-19 GM/118ML enema 1 enema  1 enema Rectal Once PRN [name], MD      . traMADol Veatrice Bourbon) tablet 50 mg  50 mg Oral Q6H PRN [name], MD   50 mg at 03/09/19 0219  . zolpidem (AMBIEN) tablet 5 mg  5 mg Oral  QHS PRN [name], MD         Objective: Vital signs in last 24 hours: Temp:  [97.8 F (36.6 C)-99 F (37.2 C)] 97.8 F (36.6 C) (09/24 0551) Pulse Rate:  [66-73] 66 (09/24 0551) Resp:  [16] 16 (09/24 0551) BP: (108-123)/(58-61) 123/61 (09/24 0551) SpO2:  [97 %-98 %] 97 % (09/24 0551)  Intake/Output from previous day: 09/23 0701 - 09/24 0700 In: 1680 [P.O.:1680] Out: 4005 [Urine:4005] Intake/Output this shift: No intake/output data recorded.   Physical Exam Vitals signs reviewed.  Constitutional:      Appearance: Normal appearance.  Abdominal:     Palpations: Abdomen is soft. There is no mass.  Neurological:     Mental Status: He is alert.     Lab Results:  No results for input(s): WBC, HGB, HCT, PLT in the last 72 hours. BMET No results for input(s): NA, K, CL, CO2, GLUCOSE, BUN, CREATININE, CALCIUM in the last 72 hours. PT/INR No results for input(s): LABPROT, INR in the last 72 hours. ABG No results for input(s): PHART, HCO3 in the last 72 hours.  Invalid input(s): PCO2, PO2  Studies/Results: Dg  C-arm 1-60 Min-no Report  Result Date: 03/07/2019 Fluoroscopy was utilized by the requesting physician.  No radiographic interpretation.     Assessment and Plan: Post TURP clot retention.   I irrigated out about 118ml of clot and the irrigant cleared.  I have left the foley out after instilling 115ml but he was unable to void initially.  I will monitor him and d/c when voiding. If he isn't able to void I will replace the foley and send him home.       LOS: 0 days    [name] 03/09/2019 [phone]

## 2019-03-09 NOTE — Progress Notes (Signed)
Patient voided once. Unable to assess. Pt encouraged to use urinal next void.

## 2019-03-09 NOTE — Progress Notes (Signed)
Patient ambulated on hall way at this time, tolerated well. Foley irrigated. Bloody urine obtain without clot.

## 2019-03-09 NOTE — Discharge Summary (Signed)
Physician Discharge Summary  Patient ID: Jorge Bishop MRN: [id number] DOB/AGE: 1935-07-07 83 y.o.  Admit date: 03/07/2019 Discharge date: 03/09/2019  Admission Diagnoses:  BPH with urinary obstruction  Discharge Diagnoses:  Principal Problem:   BPH with urinary obstruction Active Problems:   Right ureteral stone   Past Medical History:  Diagnosis Date  . Arteriosclerotic cardiovascular disease (ASCVD)    Coronary artery bypass graft surgery in 12/98; DES to the RCA SVG in 07/2002; restenosis of the saphenous vein graft stent and renal intervention in 2006; 70% first diagonal present; calf in 11/2007-total obstruction of the saphenous vein graft to the first marginal  . Benign prostatic hypertrophy   . Cerebrovascular disease    60% bilateral internal carotid artery stenosis in 2012  . Coronary artery disease    4 stents  . Diverticulosis   . DJD (degenerative joint disease)   . Erectile dysfunction   . GERD (gastroesophageal reflux disease)   . History of blood transfusion   . History of kidney stones   . Hyperlipidemia    Lipid profile in 12/2009:104, 91, 36, 50  . Hypertension   . Migraine headache   . Right bundle branch block   . Shortness of breath   . Thrombocytopenia (HCC)    platelets of 104 in 03/2009  . Upper gastrointestinal hemorrhage [id number]    Helicobacter pylori positive; subsequent laparoscopic fundoplication-2009    Surgeries: Procedure(s): CYSTOSCOPY RIGHT RETROGRADE RIGHT URETEROSCOPY TRANSURETHRAL RESECTION OF THE PROSTATE (TURP) on 03/07/2019   Consultants (if any):   Discharged Condition: Improved  Hospital Course: Jorge Bishop is an 83 y.o. adult who was admitted 03/07/2019 with a diagnosis of BPH with urinary obstruction and went to the operating room on 03/07/2019 and underwent the above named procedures.  His urine was clear at the completion of the procedure but he developed hematuria in the PACU and while the urine appeared to be clearing on  POD#1, he developed clot retention and a foley was replaced.  He was irrigated clear this morning and the foley was removed.  He has voided successfully and is ready for discharge.   He was given perioperative antibiotics:  Anti-infectives (From admission, onward)   Start     Dose/Rate Route Frequency Ordered Stop   03/07/19 1600  cephALEXin (KEFLEX) capsule 500 mg     500 mg Oral 3 times daily 03/07/19 1156     03/07/19 0846  ceFAZolin (ANCEF) IVPB 2g/100 mL premix     2 g 200 mL/hr over 30 Minutes Intravenous 30 min pre-op 03/07/19 0846 03/07/19 1013    .  He was given sequential compression devices  for DVT prophylaxis.  He benefited maximally from the hospital stay and there were no complications.    Recent vital signs:  Vitals:   03/09/19 0551 03/09/19 0829  BP: 123/61 105/67  Pulse: 66 79  Resp: 16   Temp: 97.8 F (36.6 C)   SpO2: 97%     Recent laboratory studies:  Lab Results  Component Value Date   HGB 15.4 03/02/2019   HGB 12.4 (L) 11/10/2017   HGB 13.6 09/10/2017   Lab Results  Component Value Date   WBC 5.3 03/02/2019   PLT 146 (L) 03/02/2019   Lab Results  Component Value Date   INR 1.1 03/27/2008   Lab Results  Component Value Date   NA 139 03/02/2019   K 4.7 03/02/2019   CL 106 03/02/2019   CO2 27 03/02/2019   BUN 23  03/02/2019   CREATININE 0.98 03/02/2019   GLUCOSE 112 (H) 03/02/2019    Discharge Medications:   Allergies as of 03/09/2019      Reactions   Clarithromycin Anaphylaxis   REACTION: UNKNOWN REACTION   Codeine Nausea And Vomiting   Erythromycin Anaphylaxis   Sulfa Antibiotics Anaphylaxis      Medication List    TAKE these medications   Aspirin Low Dose 81 MG tablet Generic drug: aspirin Take 81 mg by mouth daily.   atorvastatin 20 MG tablet Commonly known as: LIPITOR Take 20 mg by mouth at bedtime.   carvedilol 12.5 MG tablet Commonly known as: COREG Take 6.25 mg by mouth 2 (two) times daily with a meal.   FIBER  PO Take 2 capsules by mouth daily as needed (constipation).   loperamide 2 MG tablet Commonly known as: IMODIUM A-D Take 0.5 tablets (1 mg total) by mouth at bedtime. What changed:   when to take this  reasons to take this   metFORMIN 500 MG tablet Commonly known as: GLUCOPHAGE Take 500 mg by mouth 2 (two) times daily with a meal.   ondansetron 4 MG tablet Commonly known as: ZOFRAN Take 4 mg by mouth every 8 (eight) hours as needed for nausea or vomiting.   promethazine 25 MG tablet Commonly known as: PHENERGAN Take 25 mg by mouth every 6 (six) hours as needed for nausea or vomiting.   traMADol 50 MG tablet Commonly known as: ULTRAM Take 50 mg by mouth every 6 (six) hours as needed (pain).   Vitamin D3 50 MCG (2000 UT) Tabs Take 10,000 Units by mouth daily.       Diagnostic Studies: Dg Abd 1 View  Result Date: 02/08/2019 CLINICAL DATA:  Ureteral calculus, history of lithotripsy. EXAM: ABDOMEN - 1 VIEW COMPARISON:  Multiple prior abdominal radiographs, most recently 01/09/2019, CT abdomen pelvis September 08, 2017 FINDINGS: No residual calcification is seen over the right kidney however there has been progressive accumulation of calcifications within the right hemipelvis inferior to the right SI joint which is in the expected location of the distal right ureter. Findings may reflect multiple distal ureteral calculi versus superimposed vascular calcification. Several phleboliths are unchanged from prior exams. No calcifications seen over the course of the left urinary tract. Bowel gas pattern is unremarkable. Degenerative changes noted in the spine, SI joints and hips. IMPRESSION: Progressive accumulation of calcifications within the right hemipelvis inferior to the right SI joint which may reflect multiple distal ureteral calculi versus superimposed vascular calcification. Electronically Signed   By: Lovena Le M.D.   On: 02/08/2019 22:08   US Renal  Result Date:  02/08/2019 CLINICAL DATA:  83 year old male with kidney stones. EXAM: RENAL / URINARY TRACT ULTRASOUND COMPLETE COMPARISON:  Renal ultrasound dated 10/27/2017 FINDINGS: Right Kidney: Renal measurements: 10.8 x 5.8 x 5.7 cm = volume: 188 mL. Normal echogenicity. No hydronephrosis or shadowing stone. Left Kidney: Renal measurements: 11.6 x 6.1 x 5.6 cm = volume: 209 mL. There is a 6 mm echogenic focus in the interpolar left kidney, likely a nonobstructing stone. There is a 1.5 x 1.4 x 1.8 cm interpolar cyst. Probable mild caliectasis of the inferior pole collecting system. Bladder: Appears normal for degree of bladder distention. IMPRESSION: 1. Unremarkable right kidney. 2. A 6 mm nonobstructing left renal calculus. 3. Mild left renal inferior pole caliectasis. Electronically Signed   By: Anner Crete M.D.   On: 02/08/2019 19:23   Dg C-arm 1-60 Min-no Report  Result Date:  03/07/2019 Fluoroscopy was utilized by the requesting physician.  No radiographic interpretation.    Disposition: Discharge disposition: 01-Home or Self Care       Discharge Instructions    Discontinue IV   Complete by: As directed       Follow-up Information    [facility] [place] On 03/24/2019.   Why: 11:15 Contact information: [address], [address] [place] [id number]           Signed: [name] 03/09/2019, 1:37 PM

## 2019-03-10 ENCOUNTER — Other Ambulatory Visit: Payer: Self-pay

## 2019-03-10 LAB — SURGICAL PATHOLOGY

## 2019-03-24 ENCOUNTER — Other Ambulatory Visit (HOSPITAL_COMMUNITY): Payer: Self-pay | Admitting: Urology

## 2019-03-24 ENCOUNTER — Other Ambulatory Visit: Payer: Self-pay

## 2019-03-24 ENCOUNTER — Other Ambulatory Visit (HOSPITAL_COMMUNITY)
Admission: RE | Admit: 2019-03-24 | Discharge: 2019-03-24 | Disposition: A | Discharge status: Home / Self Care | Payer: Medicare Other | Source: Ambulatory Visit | Attending: Urology | Admitting: Urology

## 2019-03-24 ENCOUNTER — Other Ambulatory Visit: Payer: Self-pay | Admitting: Urology

## 2019-03-24 ENCOUNTER — Ambulatory Visit (INDEPENDENT_AMBULATORY_CARE_PROVIDER_SITE_OTHER): Payer: Medicare Other | Admitting: Urology

## 2019-03-24 ENCOUNTER — Ambulatory Visit (HOSPITAL_COMMUNITY)
Admission: RE | Admit: 2019-03-24 | Discharge: 2019-03-24 | Disposition: A | Discharge status: Home / Self Care | Payer: Medicare Other | Source: Ambulatory Visit | Attending: Urology | Admitting: Urology

## 2019-03-24 DIAGNOSIS — R1031 Right lower quadrant pain: Secondary | ICD-10-CM

## 2019-03-24 DIAGNOSIS — N201 Calculus of ureter: Secondary | ICD-10-CM | POA: Diagnosis not present

## 2019-03-24 DIAGNOSIS — R310 Gross hematuria: Secondary | ICD-10-CM | POA: Insufficient documentation

## 2019-03-24 DIAGNOSIS — N401 Enlarged prostate with lower urinary tract symptoms: Secondary | ICD-10-CM

## 2019-03-25 LAB — URINE CULTURE: Culture: NO GROWTH

## 2019-06-02 ENCOUNTER — Telehealth: Payer: Self-pay | Admitting: Urology

## 2019-06-02 NOTE — Telephone Encounter (Signed)
Pt called, states he is having pain after surgery. Reports not as bad as last time.

## 2019-06-02 NOTE — Telephone Encounter (Signed)
His surgery was 3 months ago so we would need to see him to assess his symptoms.  I am not sure when we would be able to see him up here, so he may need to come to [facility].

## 2019-06-06 ENCOUNTER — Encounter: Payer: Self-pay | Admitting: Urology

## 2019-06-08 ENCOUNTER — Ambulatory Visit: Payer: Medicare Other | Attending: Internal Medicine

## 2019-06-08 ENCOUNTER — Other Ambulatory Visit: Payer: Self-pay

## 2019-06-08 DIAGNOSIS — Z20822 Contact with and (suspected) exposure to covid-19: Secondary | ICD-10-CM

## 2019-06-09 LAB — NOVEL CORONAVIRUS, NAA: SARS-CoV-2, NAA: NOT DETECTED

## 2019-07-21 ENCOUNTER — Ambulatory Visit: Payer: Medicare Other | Admitting: Urology

## 2019-08-23 ENCOUNTER — Ambulatory Visit (HOSPITAL_COMMUNITY)
Admission: RE | Admit: 2019-08-23 | Discharge: 2019-08-23 | Disposition: A | Discharge status: Home / Self Care | Payer: Medicare Other | Source: Ambulatory Visit | Attending: Family Medicine | Admitting: Family Medicine

## 2019-08-23 ENCOUNTER — Other Ambulatory Visit: Payer: Self-pay

## 2019-08-23 ENCOUNTER — Other Ambulatory Visit (HOSPITAL_COMMUNITY): Payer: Self-pay | Admitting: Family Medicine

## 2019-08-23 DIAGNOSIS — M79671 Pain in right foot: Secondary | ICD-10-CM

## 2019-09-01 ENCOUNTER — Ambulatory Visit: Payer: Medicare Other | Admitting: Urology

## 2019-09-21 NOTE — Progress Notes (Signed)
Subjective:  1. Hematuria, unspecified type   2. Benign localized prostatic hyperplasia with lower urinary tract symptoms (LUTS)   3. Personal history of urinary calculi   4. Weak urinary stream      [name] returns in f/u for his history of BPH with BOO and a TURP on 03/07/19 along with right URS for a ureteral stone.   He had some post op pain but a CT in 10/20 showed no obstruction and only small renal stones.  IPSS    Row Name 09/22/19 1200         International Prostate Symptom Score   How often have you had the sensation of not emptying your bladder?  Not at All     How often have you had to urinate less than every two hours?  Not at All     How often have you found you stopped and started again several times when you urinated?  Almost always     How often have you found it difficult to postpone urination?  Less than half the time     How often have you had a weak urinary stream?  Almost always     How often have you had to strain to start urination?  Not at All     How many times did you typically get up at night to urinate?  1 Time     Total IPSS Score  13       Quality of Life due to urinary symptoms   If you were to spend the rest of your life with your urinary condition just the way it is now how would you feel about that?  Mixed        He has not had further hematuria since October.   He has had no flank pain.  He has minimal incontinence.   He is please with his results but does have a reduced stream with intermittency.  His stream was very good immediately after the procedure. .   ROS:  ROS:  A complete review of systems was performed.  All systems are negative except for pertinent findings as noted.   Review of Systems  HENT: Positive for congestion.   Musculoskeletal: Positive for back pain and joint pain.  Endo/Heme/Allergies: Bruises/bleeds easily.    Allergies  Allergen Reactions  . Clarithromycin Anaphylaxis    REACTION: UNKNOWN REACTION  . Codeine  Nausea And Vomiting  . Erythromycin Anaphylaxis  . Sulfa Antibiotics Anaphylaxis    Outpatient Encounter Medications as of 09/22/2019  Medication Sig Note  . aspirin (ASPIRIN LOW DOSE) 81 MG tablet Take 81 mg by mouth daily.     Marland Kitchen atorvastatin (LIPITOR) 20 MG tablet Take 20 mg by mouth at bedtime.     . carvedilol (COREG) 12.5 MG tablet Take 6.25 mg by mouth 2 (two) times daily with a meal.   . Cholecalciferol (VITAMIN D3) 50 MCG (2000 UT) TABS Take 10,000 Units by mouth daily.  02/28/2019: ON HOLD   . FIBER PO Take 2 capsules by mouth daily as needed (constipation).   . fluorometholone (FML) 0.1 % ophthalmic suspension SMARTSIG:1 Drop(s) In Eye(s) 2-4 Times Daily PRN   . loperamide (IMODIUM A-D) 2 MG tablet Take 0.5 tablets (1 mg total) by mouth at bedtime. (Patient taking differently: Take 1 mg by mouth as needed for diarrhea or loose stools. )   . metFORMIN (GLUCOPHAGE) 500 MG tablet Take 500 mg by mouth 2 (two) times daily with a meal.    .  ondansetron (ZOFRAN) 4 MG tablet Take 4 mg by mouth every 8 (eight) hours as needed for nausea or vomiting.    . promethazine (PHENERGAN) 25 MG tablet Take 25 mg by mouth every 6 (six) hours as needed for nausea or vomiting.   . traMADol (ULTRAM) 50 MG tablet Take 50 mg by mouth every 6 (six) hours as needed (pain).    No facility-administered encounter medications on file as of 09/22/2019.    Past Medical History:  Diagnosis Date  . Arteriosclerotic cardiovascular disease (ASCVD)    Coronary artery bypass graft surgery in 12/98; DES to the RCA SVG in 07/2002; restenosis of the saphenous vein graft stent and renal intervention in 2006; 70% first diagonal present; calf in 11/2007-total obstruction of the saphenous vein graft to the first marginal  . Benign prostatic hypertrophy   . Cerebrovascular disease    60% bilateral internal carotid artery stenosis in 2012  . Coronary artery disease    4 stents  . Diverticulosis   . DJD (degenerative joint  disease)   . Erectile dysfunction   . GERD (gastroesophageal reflux disease)   . History of blood transfusion   . History of kidney stones   . Hyperlipidemia    Lipid profile in 12/2009:104, 91, 36, 50  . Hypertension   . Migraine headache   . Right bundle branch block   . Shortness of breath   . Thrombocytopenia (HCC)    platelets of 104 in 03/2009  . Upper gastrointestinal hemorrhage 123456    Helicobacter pylori positive; subsequent laparoscopic fundoplication-2009    Past Surgical History:  Procedure Laterality Date  . CARDIAC CATHETERIZATION     4 cardiac stents  . CATARACT EXTRACTION W/PHACO Right 07/25/2012   Procedure: CATARACT EXTRACTION PHACO AND INTRAOCULAR LENS PLACEMENT (IOC);  Surgeon: Tonny Branch, MD;  Location: [facility];  Service: Ophthalmology;  Laterality: Right;  CDE:23.31  . CATARACT EXTRACTION W/PHACO Left 08/18/2012   Procedure: CATARACT EXTRACTION PHACO AND INTRAOCULAR LENS PLACEMENT (IOC);  Surgeon: Tonny Branch, MD;  Location: [facility];  Service: Ophthalmology;  Laterality: Left;  CDE: 16.07  . COLONOSCOPY N/A 08/01/2014   Procedure: COLONOSCOPY;  Surgeon: Rogene Houston, MD;  Location: [facility];  Service: Endoscopy;  Laterality: N/A;  125 - moved to 10:30 - Ann to notify pt  . CORONARY ARTERY BYPASS GRAFT  1998   . CYSTOSCOPY/URETEROSCOPY/HOLMIUM LASER/STENT PLACEMENT Right 11/12/2017   Procedure: CYSTOSCOPY/RIGHT RETROGRADE PYELOGRAM/RIGHT URETEROSCOPY/HOLMIUM LASER LITHOTRIPSY RIGHT URETERAL CALCULUS/RIGHT URETERAL STENT PLACEMENT;  Surgeon: [name], MD;  Location: [facility];  Service: Urology;  Laterality: Right;  right ureteral calculus given to patient's wife per Dr. [name]  . CYSTOSCOPY/URETEROSCOPY/HOLMIUM LASER/STENT PLACEMENT Right 03/07/2019   Procedure: CYSTOSCOPY RIGHT RETROGRADE RIGHT URETEROSCOPY;  Surgeon: [name], MD;  Location: [facility];  Service: Urology;  Laterality: Right;  . EXTRACORPOREAL SHOCK WAVE LITHOTRIPSY Right 09/13/2017   Procedure:  RIGHT EXTRACORPOREAL SHOCK WAVE LITHOTRIPSY (ESWL);  Surgeon: Lucas Mallow, MD;  Location: [facility];  Service: Urology;  Laterality: Right;  . HERNIA REPAIR     right inguinal  . LAPAROSCOPIC NISSEN FUNDOPLICATION  0000000, [id number]    revision in 2009 along with repair of paraesophagel hernia   . PARAESOPHAGEAL HERNIA REPAIR    . TRANSURETHRAL RESECTION OF PROSTATE N/A 03/07/2019   Procedure: TRANSURETHRAL RESECTION OF THE PROSTATE (TURP);  Surgeon: [name], MD;  Location: [facility];  Service: Urology;  Laterality: N/A;    Social History   Socioeconomic History  .  Marital status: Married    Spouse name: Not on file  . Number of children: Not on file  . Years of education: Not on file  . Highest education level: Not on file  Occupational History  . Occupation: Full time    Employer: RETIRED  Tobacco Use  . Smoking status: Never Smoker  . Smokeless tobacco: Never Used  Substance and Sexual Activity  . Alcohol use: No    Alcohol/week: 0.0 standard drinks  . Drug use: No  . Sexual activity: Yes    Birth control/protection: None  Other Topics Concern  . Not on file  Social History Narrative  . Not on file   Social Determinants of Health   Financial Resource Strain:   . Difficulty of Paying Living Expenses:   Food Insecurity:   . Worried About Charity fundraiser in the Last Year:   . Arboriculturist in the Last Year:   Transportation Needs:   . Film/video editor (Medical):   Marland Kitchen Lack of Transportation (Non-Medical):   Physical Activity:   . Days of Exercise per Week:   . Minutes of Exercise per Session:   Stress:   . Feeling of Stress :   Social Connections:   . Frequency of Communication with Friends and Family:   . Frequency of Social Gatherings with Friends and Family:   . Attends Religious Services:   . Active Member of Clubs or Organizations:   . Attends Archivist Meetings:   Marland Kitchen Marital Status:   Intimate Partner Violence:   . Fear of Current or  Ex-Partner:   . Emotionally Abused:   Marland Kitchen Physically Abused:   . Sexually Abused:     Family History  Problem Relation Age of Onset  . CAD Father        Objective: Vitals:   09/22/19 1136  BP: 137/70  Pulse: 65  Temp: 97.9 F (36.6 C)     Physical Exam  Lab Results:  Results for orders placed or performed in visit on 09/22/19 (from the past 24 hour(s))  POCT urinalysis dipstick     Status: Abnormal   Collection Time: 09/22/19 11:50 AM  Result Value Ref Range   Color, UA yellow    Clarity, UA     Glucose, UA Negative Negative   Bilirubin, UA neg    Ketones, UA neg    Spec Grav, UA 1.020 1.010 - 1.025   Blood, UA neg    pH, UA 5.0 5.0 - 8.0   Protein, UA Positive (A) Negative   Urobilinogen, UA 0.2 0.2 or 1.0 E.U./dL   Nitrite, UA neg    Leukocytes, UA Negative Negative   Appearance clear    Odor      BMET No results for input(s): NA, K, CL, CO2, GLUCOSE, BUN, CREATININE, CALCIUM in the last 72 hours. PSA No results found for: PSA No results found for: TESTOSTERONE    Studies/Results: No results found.    Assessment & Plan: History of stones.  He has had no further flank pain or hematuria.  BPH with BOO.  He is doing better s/p TURP but his stream has weakened and he has intermittency.  I will have him return for a flowrate and PVR in a few weeks to assess for a possible stricture.    No orders of the defined types were placed in this encounter.    Orders Placed This Encounter  Procedures  . POCT urinalysis dipstick  Return in about 4 weeks (around 10/20/2019) for For a flowrate and PVR. Marland Kitchen   CC: [name], MD      [name] 09/22/2019

## 2019-09-22 ENCOUNTER — Encounter: Payer: Self-pay | Admitting: Urology

## 2019-09-22 ENCOUNTER — Ambulatory Visit (INDEPENDENT_AMBULATORY_CARE_PROVIDER_SITE_OTHER): Payer: Medicare Other | Admitting: Urology

## 2019-09-22 ENCOUNTER — Other Ambulatory Visit: Payer: Self-pay

## 2019-09-22 VITALS — BP 137/70 | HR 65 | Temp 97.9°F | Ht 72.0 in | Wt 181.0 lb

## 2019-09-22 DIAGNOSIS — Z87442 Personal history of urinary calculi: Secondary | ICD-10-CM | POA: Diagnosis not present

## 2019-09-22 DIAGNOSIS — R3912 Poor urinary stream: Secondary | ICD-10-CM | POA: Diagnosis not present

## 2019-09-22 DIAGNOSIS — N401 Enlarged prostate with lower urinary tract symptoms: Secondary | ICD-10-CM | POA: Diagnosis not present

## 2019-09-22 DIAGNOSIS — R319 Hematuria, unspecified: Secondary | ICD-10-CM | POA: Diagnosis not present

## 2019-09-22 LAB — POCT URINALYSIS DIPSTICK
Bilirubin, UA: NEGATIVE
Blood, UA: NEGATIVE
Glucose, UA: NEGATIVE
Ketones, UA: NEGATIVE
Leukocytes, UA: NEGATIVE
Nitrite, UA: NEGATIVE
Protein, UA: POSITIVE — AB
Spec Grav, UA: 1.02 (ref 1.010–1.025)
Urobilinogen, UA: 0.2 E.U./dL
pH, UA: 5 (ref 5.0–8.0)

## 2019-11-03 ENCOUNTER — Encounter: Payer: Self-pay | Admitting: Urology

## 2019-11-03 ENCOUNTER — Other Ambulatory Visit: Payer: Self-pay

## 2019-11-03 ENCOUNTER — Ambulatory Visit (INDEPENDENT_AMBULATORY_CARE_PROVIDER_SITE_OTHER): Payer: Medicare Other | Admitting: Urology

## 2019-11-03 VITALS — BP 125/64 | HR 82 | Temp 98.1°F | Ht 72.0 in | Wt 181.0 lb

## 2019-11-03 DIAGNOSIS — R3912 Poor urinary stream: Secondary | ICD-10-CM | POA: Diagnosis not present

## 2019-11-03 DIAGNOSIS — N401 Enlarged prostate with lower urinary tract symptoms: Secondary | ICD-10-CM | POA: Diagnosis not present

## 2019-11-03 LAB — BLADDER SCAN AMB NON-IMAGING: Scan Result: 25.8

## 2019-11-03 MED ORDER — CIPROFLOXACIN HCL 500 MG PO TABS
500.0000 mg | ORAL_TABLET | Freq: Once | ORAL | Status: AC
  Administered 2019-11-03: 500 mg via ORAL

## 2019-11-03 NOTE — Progress Notes (Signed)
   11/03/19  CC: urinary hesitancy  HPI: Mr Lassonde is a 84yo here for followup for BPH. He continue to have urinary frequency, hesitancy, and intermittent weak stream  Blood pressure 125/64, pulse 82, temperature 98.1 F (36.7 C), height 6' (1.829 m), weight 181 lb (82.1 kg). NED. A&Ox3.   No respiratory distress   Abd soft, NT, ND Normal phallus with bilateral descended testicles  Cystoscopy Procedure Note  Patient identification was confirmed, informed consent was obtained, and patient was prepped using Betadine solution.  Lidocaine jelly was administered per urethral meatus.     Pre-Procedure: - Inspection reveals a normal caliber ureteral meatus.  Procedure: The flexible cystoscope was introduced without difficulty - No urethral strictures/lesions are present. - Enlarged prostate: obstructing tissue at bladder neck - Tight bladder neck - Bilateral ureteral orifices identified - Bladder mucosa  reveals no ulcers, tumors, or lesions - No bladder stones - No trabeculation   Post-Procedure: - Patient tolerated the procedure well  Assessment/ Plan: BPH with LUTS, weak stream We dsicussed the management includign repeating TURP for bladder neck obstruction and the patient wishes to proceed. Risks/benefits/alternaitves discussed   No follow-ups on file.  Nicolette Bang, MD

## 2019-11-03 NOTE — Progress Notes (Signed)
Urological Symptom Review  Patient is experiencing the following symptoms: none   Review of Systems  Gastrointestinal (upper)  : Negative for upper GI symptoms  Gastrointestinal (lower) : Negative for lower GI symptoms  Constitutional : Negative for symptoms  Skin: Negative for skin symptoms  Eyes: Negative for eye symptoms  Ear/Nose/Throat : Negative for Ear/Nose/Throat symptoms  Hematologic/Lymphatic: Negative for Hematologic/Lymphatic symptoms  Cardiovascular : Negative for cardiovascular symptoms  Respiratory : Negative for respiratory symptoms  Endocrine: Negative for endocrine symptoms  Musculoskeletal: Negative for musculoskeletal symptoms  Neurological: Negative for neurological symptoms  Psychologic: Negative for psychiatric symptoms   Uroflow  Peak Flow: 51ml Average Flow: 19ml Voided Volume: 468ml Voiding Time: 56sec Flow Time: 45sec Time to Peak Flow: 9sec  PVR Volume: 25.48ml

## 2019-11-03 NOTE — Patient Instructions (Signed)

## 2019-11-28 ENCOUNTER — Other Ambulatory Visit: Payer: Self-pay

## 2019-11-28 ENCOUNTER — Ambulatory Visit (INDEPENDENT_AMBULATORY_CARE_PROVIDER_SITE_OTHER): Payer: Medicare Other | Admitting: Student

## 2019-11-28 ENCOUNTER — Encounter: Payer: Self-pay | Admitting: Student

## 2019-11-28 VITALS — BP 130/78 | HR 76 | Ht 72.0 in | Wt 184.0 lb

## 2019-11-28 DIAGNOSIS — I739 Peripheral vascular disease, unspecified: Secondary | ICD-10-CM | POA: Diagnosis not present

## 2019-11-28 DIAGNOSIS — I10 Essential (primary) hypertension: Secondary | ICD-10-CM

## 2019-11-28 DIAGNOSIS — N401 Enlarged prostate with lower urinary tract symptoms: Secondary | ICD-10-CM

## 2019-11-28 DIAGNOSIS — E785 Hyperlipidemia, unspecified: Secondary | ICD-10-CM | POA: Diagnosis not present

## 2019-11-28 DIAGNOSIS — I2510 Atherosclerotic heart disease of native coronary artery without angina pectoris: Secondary | ICD-10-CM | POA: Diagnosis not present

## 2019-11-28 NOTE — Patient Instructions (Signed)
Medication Instructions:  Your physician recommends that you continue on your current medications as directed. Please refer to the Current Medication list given to you today.  *If you need a refill on your cardiac medications before your next appointment, please call your pharmacy*   Lab Work: NONE   If you have labs (blood work) drawn today and your tests are completely normal, you will receive your results only by:  [place] (if you have MyChart) OR  A paper copy in the mail If you have any lab test that is abnormal or we need to change your treatment, we will call you to review the results.   Testing/Procedures: Your physician has requested that you have an ankle brachial index (ABI). During this test an ultrasound and blood pressure cuff are used to evaluate the arteries that supply the arms and legs with blood. Allow thirty minutes for this exam. There are no restrictions or special instructions.     Follow-Up: At [facility], you and your health needs are our priority.  As part of our continuing mission to provide you with exceptional heart care, we have created designated Provider Care Teams.  These Care Teams include your primary Cardiologist (physician) and Advanced Practice Providers (APPs -  Physician Assistants and Nurse Practitioners) who all work together to provide you with the care you need, when you need it.  We recommend signing up for the patient portal called "MyChart".  Sign up information is provided on this After Visit Summary.  MyChart is used to connect with patients for Virtual Visits (Telemedicine).  Patients are able to view lab/test results, encounter notes, upcoming appointments, etc.  Non-urgent messages can be sent to your provider as well.   To learn more about what you can do with MyChart, go to [email or web address].    Your next appointment:   1 year(s)  The format for your next appointment:   In Person  Provider:   Kate Sable, MD   Other Instructions Thank you for choosing [facility]!

## 2019-11-28 NOTE — Progress Notes (Signed)
Cardiology Office Note    Date:  11/28/2019   ID:  Jorge Bishop, DOB 1936-05-20, MRN [id number]  PCP:  [name], MD  Cardiologist: Kate Sable, MD    Chief Complaint  Patient presents with  . Follow-up    Annual Visit    History of Present Illness:    Jorge Bishop is a 84 y.o. adult with past medical history of CAD (s/p CABG in 1998 with DES to [place] in 2004, restenosis by cath in 2009 with medical management recommended, low-risk NST in 2016), HTN, HLD, BPH and DJD who presents to the office today for annual follow-up.   He was last examined by myself in 12/2018 and denied any recent chest pain, palpitations or dyspnea at that time. He was walking 2 to 3 miles per day without anginal symptoms. He was previously volunteering with cardiac rehab on a weekly basis but was unable to do so at that time given the pandemic. He was continued on ASA 81mg  daily, Coreg 6.25mg  BID, and Atorvastatin 20mg  daily.   In talking with the patient today, he reports doing well from a cardiac perspective and denies any recent chest pain or dyspnea on exertion. He was previously helping with cardiac rehab but has been unable to given worsening back pain and feeling unsteady on his feet. He has been walking around his house and in the yard but has not been walking around the neighborhood as he was previously walking 2 to 3 miles a day. He denies any recent orthopnea, PND or lower extremity edema.  He does experience a pain and cramping along his left leg which can occur at rest or with activity but is typically worse when sitting. He is unaware of any precipitating factors. Does notice intermittent discoloration along his left foot. Symptoms have been occurring for several months and K+ was within a normal range when checked by the [place] in 10/2019.  He is being followed by Urology for BPH and mentions a possible upcoming TURP procedure.    Past Medical History:  Diagnosis Date  .  Arteriosclerotic cardiovascular disease (ASCVD)    Coronary artery bypass graft surgery in 12/98; DES to the RCA SVG in 07/2002; restenosis of the saphenous vein graft stent and renal intervention in 2006; 70% first diagonal present; calf in 11/2007-total obstruction of the saphenous vein graft to the first marginal  . Benign prostatic hypertrophy   . Cerebrovascular disease    60% bilateral internal carotid artery stenosis in 2012  . Coronary artery disease    4 stents  . Diverticulosis   . DJD (degenerative joint disease)   . Erectile dysfunction   . GERD (gastroesophageal reflux disease)   . History of blood transfusion   . History of kidney stones   . Hyperlipidemia    Lipid profile in 12/2009:104, 91, 36, 50  . Hypertension   . Migraine headache   . Right bundle branch block   . Shortness of breath   . Thrombocytopenia (HCC)    platelets of 104 in 03/2009  . Upper gastrointestinal hemorrhage 32/20    Helicobacter pylori positive; subsequent laparoscopic fundoplication-2009    Past Surgical History:  Procedure Laterality Date  . CARDIAC CATHETERIZATION     4 cardiac stents  . CATARACT EXTRACTION W/PHACO Right 07/25/2012   Procedure: CATARACT EXTRACTION PHACO AND INTRAOCULAR LENS PLACEMENT (IOC);  Surgeon: Tonny Branch, MD;  Location: [facility];  Service: Ophthalmology;  Laterality: Right;  CDE:23.31  . CATARACT EXTRACTION W/PHACO  Left 08/18/2012   Procedure: CATARACT EXTRACTION PHACO AND INTRAOCULAR LENS PLACEMENT (IOC);  Surgeon: Tonny Branch, MD;  Location: [facility];  Service: Ophthalmology;  Laterality: Left;  CDE: 16.07  . COLONOSCOPY N/A 08/01/2014   Procedure: COLONOSCOPY;  Surgeon: Rogene Houston, MD;  Location: [facility];  Service: Endoscopy;  Laterality: N/A;  125 - moved to 10:30 - Ann to notify pt  . CORONARY ARTERY BYPASS GRAFT  1998   . CYSTOSCOPY/URETEROSCOPY/HOLMIUM LASER/STENT PLACEMENT Right 11/12/2017   Procedure: CYSTOSCOPY/RIGHT RETROGRADE PYELOGRAM/RIGHT  URETEROSCOPY/HOLMIUM LASER LITHOTRIPSY RIGHT URETERAL CALCULUS/RIGHT URETERAL STENT PLACEMENT;  Surgeon: [name], MD;  Location: [facility];  Service: Urology;  Laterality: Right;  right ureteral calculus given to patient's wife per Dr. [name]  . CYSTOSCOPY/URETEROSCOPY/HOLMIUM LASER/STENT PLACEMENT Right 03/07/2019   Procedure: CYSTOSCOPY RIGHT RETROGRADE RIGHT URETEROSCOPY;  Surgeon: [name], MD;  Location: [facility];  Service: Urology;  Laterality: Right;  . EXTRACORPOREAL SHOCK WAVE LITHOTRIPSY Right 09/13/2017   Procedure: RIGHT EXTRACORPOREAL SHOCK WAVE LITHOTRIPSY (ESWL);  Surgeon: Lucas Mallow, MD;  Location: [facility];  Service: Urology;  Laterality: Right;  . HERNIA REPAIR     right inguinal  . LAPAROSCOPIC NISSEN FUNDOPLICATION  3295, 1884    revision in 2009 along with repair of paraesophagel hernia   . PARAESOPHAGEAL HERNIA REPAIR    . TRANSURETHRAL RESECTION OF PROSTATE N/A 03/07/2019   Procedure: TRANSURETHRAL RESECTION OF THE PROSTATE (TURP);  Surgeon: [name], MD;  Location: [facility];  Service: Urology;  Laterality: N/A;    Current Medications: Outpatient Medications Prior to Visit  Medication Sig Dispense Refill  . aspirin (ASPIRIN LOW DOSE) 81 MG tablet Take 81 mg by mouth daily.      Marland Kitchen atorvastatin (LIPITOR) 20 MG tablet Take 20 mg by mouth at bedtime.      . carvedilol (COREG) 12.5 MG tablet Take 6.25 mg by mouth 2 (two) times daily with a meal.    . Cholecalciferol (VITAMIN D3) 50 MCG (2000 UT) TABS Take 10,000 Units by mouth daily.     Marland Kitchen FIBER PO Take 2 capsules by mouth daily as needed (constipation).    . fluorometholone (FML) 0.1 % ophthalmic suspension SMARTSIG:1 Drop(s) In Eye(s) 2-4 Times Daily PRN    . ondansetron (ZOFRAN) 4 MG tablet Take 4 mg by mouth every 8 (eight) hours as needed for nausea or vomiting.     . penicillin v potassium (VEETID) 500 MG tablet TAKE 2 TABLETS BY MOUTH NOW THEN TAKE 1 TABLET 4 TIMES DAILY UNTIL GONE    . promethazine (PHENERGAN)  25 MG tablet Take 25 mg by mouth every 6 (six) hours as needed for nausea or vomiting.    . traMADol (ULTRAM) 50 MG tablet Take 50 mg by mouth every 6 (six) hours as needed (pain).    Marland Kitchen loperamide (IMODIUM A-D) 2 MG tablet Take 0.5 tablets (1 mg total) by mouth at bedtime. (Patient taking differently: Take 1 mg by mouth as needed for diarrhea or loose stools. ) 1 tablet 0  . metFORMIN (GLUCOPHAGE) 500 MG tablet Take 500 mg by mouth 2 (two) times daily with a meal.      No facility-administered medications prior to visit.     Allergies:   Clarithromycin, Codeine, Erythromycin, and Sulfa antibiotics   Social History   Socioeconomic History  . Marital status: Married    Spouse name: Not on file  . Number of children: Not on file  . Years of education: Not on file  .  Highest education level: Not on file  Occupational History  . Occupation: Full time    Employer: RETIRED  Tobacco Use  . Smoking status: Never Smoker  . Smokeless tobacco: Never Used  Vaping Use  . Vaping Use: Never used  Substance and Sexual Activity  . Alcohol use: No    Alcohol/week: 0.0 standard drinks  . Drug use: No  . Sexual activity: Yes    Birth control/protection: None  Other Topics Concern  . Not on file  Social History Narrative  . Not on file   Social Determinants of Health   Financial Resource Strain:   . Difficulty of Paying Living Expenses:   Food Insecurity:   . Worried About Charity fundraiser in the Last Year:   . Arboriculturist in the Last Year:   Transportation Needs:   . Film/video editor (Medical):   Marland Kitchen Lack of Transportation (Non-Medical):   Physical Activity:   . Days of Exercise per Week:   . Minutes of Exercise per Session:   Stress:   . Feeling of Stress :   Social Connections:   . Frequency of Communication with Friends and Family:   . Frequency of Social Gatherings with Friends and Family:   . Attends Religious Services:   . Active Member of Clubs or Organizations:     . Attends Archivist Meetings:   Marland Kitchen Marital Status:      Family History:  The patient's family history includes CAD in his father.   Review of Systems:   Please see the history of present illness.     General:  No chills, fever, night sweats or weight changes. Positive for back pain.  Cardiovascular:  No chest pain, dyspnea on exertion, edema, orthopnea, palpitations, paroxysmal nocturnal dyspnea. Dermatological: No rash, lesions/masses Respiratory: No cough, dyspnea Urologic: No hematuria, Positive for dysuria. Abdominal:   No nausea, vomiting, diarrhea, bright red blood per rectum, melena, or hematemesis Neurologic:  No visual changes, wkns, changes in mental status. All other systems reviewed and are otherwise negative except as noted above.   Physical Exam:    VS:  BP 130/78   Pulse 76   Ht 6' (1.829 m)   Wt 184 lb (83.5 kg)   SpO2 97%   BMI 24.95 kg/m    General: Well developed, well nourished,adult appearing in no acute distress. Head: Normocephalic, atraumatic, sclera non-icteric.  Neck: No carotid bruits. JVD not elevated.  Lungs: Respirations regular and unlabored, without wheezes or rales.  Heart: Regular rate and rhythm. No S3 or S4.  No murmur, no rubs, or gallops appreciated. Abdomen: Soft, non-tender, non-distended. No obvious abdominal masses. Msk:  Strength and tone appear normal for age. No obvious joint deformities or effusions. Extremities: No clubbing or cyanosis. No lower extremity edema.  Distal pedal pulses are 2+ bilaterally. Neuro: Alert and oriented X 3. Moves all extremities spontaneously. No focal deficits noted. Psych:  Responds to questions appropriately with a normal affect. Skin: No rashes or lesions noted  Wt Readings from Last 3 Encounters:  11/28/19 184 lb (83.5 kg)  11/03/19 181 lb (82.1 kg)  09/22/19 181 lb (82.1 kg)    Studies/Labs Reviewed:   EKG:  EKG is ordered today.  The ekg ordered today demonstrates NSR, HR 72 with  known RBBB and old inferior infarct pattern. No acute ST abnormalities when compared to prior tracings.    Recent Labs: 03/02/2019: BUN 23; Creatinine, Ser 0.98; Hemoglobin 15.4; Platelets 146; Potassium  4.7; Sodium 139   Lipid Panel    Component Value Date/Time   CHOL 125 03/02/2019 1039   TRIG 68 03/02/2019 1039   HDL 36 (L) 03/02/2019 1039   CHOLHDL 3.5 03/02/2019 1039   VLDL 14 03/02/2019 1039   [place] 75 03/02/2019 1039    Additional studies/ records that were reviewed today include:   NST: 11/2014  The study is normal.  This is a low risk study.  Nuclear stress EF: 63%.   Echocardiogram: 08/2017 Study Conclusions   - Left ventricle: The cavity size was normal. Wall thickness was  increased in a pattern of mild LVH. Systolic function was normal.  The estimated ejection fraction was in the range of 55% to 60%.  Wall motion was normal; there were no regional wall motion  abnormalities. Doppler parameters are consistent with abnormal  left ventricular relaxation (grade 1 diastolic dysfunction).  Doppler parameters are consistent with high ventricular filling  pressure.  - Aortic valve: Moderately calcified annulus. Trileaflet;  moderately thickened leaflets. There was mild regurgitation.  Valve area (VTI): 2.6 cm^2. Valve area (Vmax): 2.25 cm^2. Valve  area (Vmean): 1.81 cm^2.  - Mitral valve: Moderately calcified annulus. Mildly thickened  leaflets .  - Left atrium: The atrium was mildly to moderately dilated.  - Technically adequate study.   Assessment:    1. Coronary artery disease involving native coronary artery of native heart without angina pectoris   2. Essential hypertension   3. Hyperlipidemia LDL goal <70   4. Claudication in peripheral vascular disease ([place])   5. Benign localized prostatic hyperplasia with lower urinary tract symptoms (LUTS)      Plan:   In order of problems listed above:  1. CAD -  He is s/p CABG in  1998 with DES to [place] in 2004 and restenosis by cath in 2009 with medical management recommended. Most recent ischemic evaluation was a low-risk NST in 2016. - He denies any recent chest pain or dyspnea on exertion. He was previously walking 2 to 3 miles on a daily basis but has been unable to do so recently given his back pain. He is trying to gradually increase his physical activity. - Continue current medication regimen with ASA 81 mg daily, Atorvastatin 20 mg daily and Coreg 6.25 mg twice daily.   2. HTN - BP is well controlled at 130/78 during today's visit. Continue current medication regimen with Coreg 6.25 mg twice daily.  3. HLD - This has been followed by the [facility] and most recent [facility] in 10/2019 showed LDL at 71.  LFT's were within normal limits. Continue current regimen for now with Atorvastatin 20 mg daily.  4. Leg Pain concerning for Claudication - He describes leg pain which is most notable along his left lower extremity and this can occur at rest or with activity but is more notable when sitting. Feels different from his prior neuropathy. He does report noting color changes along his left foot. Will plan to obtain ABI's and lower extremity dopplers to rule out PAD given his multiple risk factors.   5. BPH - He has been followed by Urology and mentions a possible upcoming TURP procedure. He underwent this in 78/6767 with no complications were noted at that time. Given his overall stable cardiac status and no recent anginal symptoms, would not anticipate further cardiac testing at this time if he does end up requiring a repeat TURP.   Medication Adjustments/Labs and Tests Ordered: Current medicines are reviewed at length with  the patient today.  Concerns regarding medicines are outlined above.  Medication changes, Labs and Tests ordered today are listed in the Patient Instructions below. Patient Instructions  Medication Instructions:  Your physician recommends that you continue on  your current medications as directed. Please refer to the Current Medication list given to you today.  *If you need a refill on your cardiac medications before your next appointment, please call your pharmacy*   Lab Work: NONE   If you have labs (blood work) drawn today and your tests are completely normal, you will receive your results only by: Marland Kitchen MyChart Message (if you have MyChart) OR . A paper copy in the mail If you have any lab test that is abnormal or we need to change your treatment, we will call you to review the results.   Testing/Procedures: Your physician has requested that you have an ankle brachial index (ABI). During this test an ultrasound and blood pressure cuff are used to evaluate the arteries that supply the arms and legs with blood. Allow thirty minutes for this exam. There are no restrictions or special instructions.     Follow-Up: At [facility], you and your health needs are our priority.  As part of our continuing mission to provide you with exceptional heart care, we have created designated Provider Care Teams.  These Care Teams include your primary Cardiologist (physician) and Advanced Practice Providers (APPs -  Physician Assistants and Nurse Practitioners) who all work together to provide you with the care you need, when you need it.  We recommend signing up for the patient portal called "MyChart".  Sign up information is provided on this After Visit Summary.  MyChart is used to connect with patients for Virtual Visits (Telemedicine).  Patients are able to view lab/test results, encounter notes, upcoming appointments, etc.  Non-urgent messages can be sent to your provider as well.   To learn more about what you can do with MyChart, go to [email or web address].    Your next appointment:   1 year(s)  The format for your next appointment:   In Person  Provider:   Kate Sable, MD   Other Instructions Thank you for choosing [facility]!     Signed, Erma Heritage, PA-C  11/28/2019 5:18 PM    [facility] [address] [place], [place] [address] Phone: [phone] Fax: [phone]

## 2019-12-06 ENCOUNTER — Other Ambulatory Visit: Payer: Self-pay | Admitting: Student

## 2019-12-06 DIAGNOSIS — I739 Peripheral vascular disease, unspecified: Secondary | ICD-10-CM

## 2019-12-28 ENCOUNTER — Ambulatory Visit (INDEPENDENT_AMBULATORY_CARE_PROVIDER_SITE_OTHER): Payer: Medicare Other

## 2019-12-28 DIAGNOSIS — I739 Peripheral vascular disease, unspecified: Secondary | ICD-10-CM

## 2020-01-30 DIAGNOSIS — M542 Cervicalgia: Secondary | ICD-10-CM | POA: Insufficient documentation

## 2020-01-30 DIAGNOSIS — M4726 Other spondylosis with radiculopathy, lumbar region: Secondary | ICD-10-CM | POA: Insufficient documentation

## 2020-08-13 DIAGNOSIS — R42 Dizziness and giddiness: Secondary | ICD-10-CM | POA: Insufficient documentation

## 2020-10-09 LAB — HM DIABETES EYE EXAM

## 2020-12-12 ENCOUNTER — Other Ambulatory Visit: Payer: Self-pay

## 2020-12-12 ENCOUNTER — Encounter: Payer: Self-pay | Admitting: Student

## 2020-12-12 ENCOUNTER — Ambulatory Visit (INDEPENDENT_AMBULATORY_CARE_PROVIDER_SITE_OTHER): Payer: Medicare Other | Admitting: Student

## 2020-12-12 VITALS — BP 144/80 | HR 79 | Ht 72.0 in | Wt 178.0 lb

## 2020-12-12 DIAGNOSIS — R079 Chest pain, unspecified: Secondary | ICD-10-CM

## 2020-12-12 DIAGNOSIS — I10 Essential (primary) hypertension: Secondary | ICD-10-CM

## 2020-12-12 DIAGNOSIS — E785 Hyperlipidemia, unspecified: Secondary | ICD-10-CM

## 2020-12-12 DIAGNOSIS — I6523 Occlusion and stenosis of bilateral carotid arteries: Secondary | ICD-10-CM | POA: Diagnosis not present

## 2020-12-12 DIAGNOSIS — I25118 Atherosclerotic heart disease of native coronary artery with other forms of angina pectoris: Secondary | ICD-10-CM | POA: Diagnosis not present

## 2020-12-12 MED ORDER — PANTOPRAZOLE SODIUM 40 MG PO TBEC: 40.0000 mg | DELAYED_RELEASE_TABLET | Freq: Every day | ORAL | 11 refills | Status: DC

## 2020-12-12 MED ORDER — NITROGLYCERIN 0.4 MG SL SUBL: 0.4000 mg | SUBLINGUAL_TABLET | SUBLINGUAL | 3 refills | Status: DC | PRN

## 2020-12-12 NOTE — Progress Notes (Signed)
Cardiology Office Note    Date:  12/12/2020   ID:  Jorge Bishop, Jorge Bishop [birth year], MRN [id number]  PCP:  Lindell Spar, MD  Cardiologist: Previously Dr. Bronson Ing --> Needs to switch to new MD  Chief Complaint  Patient presents with   Follow-up    Annual Visit; Recent chest discomfort    History of Present Illness:    Jorge Bishop is a 85 y.o. male with past medical history of CAD (s/p CABG in 1998, DES to [place] in 2004, restenosis by cath in 2009 with medical management recommended, low-risk NST in 2016), HTN, HLD, BPH and DJD who presents to the office today for annual follow-up.   He was last examined by myself in 11/2019 and denied any recent anginal symptoms. He was previously walking 2-3 miles each day but had recently been unable to do so given worsening back pain. He was continued on his current cardiac medications including ASA, Atorvastatin and Coreg. He did report pain along his lower extremities and ABI's were recommended for further evaluation and showed no evidence of significant lower extremity arterial disease.   In talking with the patient today, he reports more progressive dyspnea on exertion and fatigue for the past 4-6 weeks. Symptoms are intermittent as he has recently been walking the trail at [place] without anginal symptoms but says his joints limit him. Starting earlier this week, he developed discomfort along her epigastric/sternal region which has been worse with food consumption but has lingered for several days. Not worse with exertion. Feels different from his prior angina. Says he was previously on PPI therapy several years ago but no longer takes. Has not utilized OTC antiacids or SL NTG.    Past Medical History:  Diagnosis Date   Arteriosclerotic cardiovascular disease (ASCVD)    Coronary artery bypass graft surgery in 12/98; DES to the RCA SVG in 07/2002; restenosis of the saphenous vein graft stent and renal intervention in 2006; 70% first  diagonal present; calf in 11/2007-total obstruction of the saphenous vein graft to the first marginal   Benign prostatic hypertrophy    Cerebrovascular disease    60% bilateral internal carotid artery stenosis in 2012   Coronary artery disease    4 stents   Diverticulosis    DJD (degenerative joint disease)    Erectile dysfunction    GERD (gastroesophageal reflux disease)    History of blood transfusion    History of kidney stones    Hyperlipidemia    Lipid profile in 12/2009:104, 91, 36, 50   Hypertension    Migraine headache    Right bundle branch block    Shortness of breath    Thrombocytopenia (HCC)    platelets of 104 in 03/2009   Upper gastrointestinal hemorrhage 98/33    Helicobacter pylori positive; subsequent laparoscopic fundoplication-2009    Past Surgical History:  Procedure Laterality Date   CARDIAC CATHETERIZATION     4 cardiac stents   CATARACT EXTRACTION W/PHACO Right 07/25/2012   Procedure: CATARACT EXTRACTION PHACO AND INTRAOCULAR LENS PLACEMENT ([place]);  Surgeon: Tonny Branch, MD;  Location: [facility];  Service: Ophthalmology;  Laterality: Right;  CDE:23.31   CATARACT EXTRACTION W/PHACO Left 08/18/2012   Procedure: CATARACT EXTRACTION PHACO AND INTRAOCULAR LENS PLACEMENT ([facility]);  Surgeon: Tonny Branch, MD;  Location: [facility];  Service: Ophthalmology;  Laterality: Left;  CDE: 16.07   COLONOSCOPY N/A 08/01/2014   Procedure: COLONOSCOPY;  Surgeon: Rogene Houston, MD;  Location: [facility];  Service: Endoscopy;  Laterality: N/A;  125 - moved to 10:30 - Ann to notify pt   CORONARY ARTERY BYPASS GRAFT  1998    CYSTOSCOPY/URETEROSCOPY/HOLMIUM LASER/STENT PLACEMENT Right 11/12/2017   Procedure: CYSTOSCOPY/RIGHT RETROGRADE PYELOGRAM/RIGHT URETEROSCOPY/HOLMIUM LASER LITHOTRIPSY RIGHT URETERAL CALCULUS/RIGHT URETERAL STENT PLACEMENT;  Surgeon: [name], MD;  Location: [facility];  Service: Urology;  Laterality: Right;  right ureteral calculus given to patient's wife per Dr. [name]    CYSTOSCOPY/URETEROSCOPY/HOLMIUM LASER/STENT PLACEMENT Right 03/07/2019   Procedure: CYSTOSCOPY RIGHT RETROGRADE RIGHT URETEROSCOPY;  Surgeon: [name], MD;  Location: [facility];  Service: Urology;  Laterality: Right;   EXTRACORPOREAL SHOCK WAVE LITHOTRIPSY Right 09/13/2017   Procedure: RIGHT EXTRACORPOREAL SHOCK WAVE LITHOTRIPSY (ESWL);  Surgeon: Lucas Mallow, MD;  Location: [facility];  Service: Urology;  Laterality: Right;   HERNIA REPAIR     right inguinal   LAPAROSCOPIC NISSEN FUNDOPLICATION  0086, 7619    revision in 2009 along with repair of paraesophagel hernia    PARAESOPHAGEAL HERNIA REPAIR     TRANSURETHRAL RESECTION OF PROSTATE N/A 03/07/2019   Procedure: TRANSURETHRAL RESECTION OF THE PROSTATE (TURP);  Surgeon: [name], MD;  Location: [facility];  Service: Urology;  Laterality: N/A;    Current Medications: Outpatient Medications Prior to Visit  Medication Sig Dispense Refill   aspirin 81 MG tablet Take 81 mg by mouth daily.       atorvastatin (LIPITOR) 20 MG tablet Take 20 mg by mouth at bedtime.       carvedilol (COREG) 12.5 MG tablet Take 6.25 mg by mouth 2 (two) times daily with a meal.     Cholecalciferol (VITAMIN D3) 50 MCG (2000 UT) TABS Take 10,000 Units by mouth daily.      FIBER PO Take 2 capsules by mouth daily as needed (constipation).     fluorometholone (FML) 0.1 % ophthalmic suspension SMARTSIG:1 Drop(s) In Eye(s) 2-4 Times Daily PRN     ondansetron (ZOFRAN) 4 MG tablet Take 4 mg by mouth every 8 (eight) hours as needed for nausea or vomiting.      promethazine (PHENERGAN) 25 MG tablet Take 25 mg by mouth every 6 (six) hours as needed for nausea or vomiting.     SYNTHROID 50 MCG tablet Take 50 mcg by mouth daily.     traMADol (ULTRAM) 50 MG tablet Take 50 mg by mouth every 6 (six) hours as needed (pain).     penicillin v potassium (VEETID) 500 MG tablet TAKE 2 TABLETS BY MOUTH NOW THEN TAKE 1 TABLET 4 TIMES DAILY UNTIL GONE     No facility-administered medications  prior to visit.     Allergies:   Clarithromycin, Codeine, Erythromycin, and Sulfa antibiotics   Social History   Socioeconomic History   Marital status: Married    Spouse name: Not on file   Number of children: Not on file   Years of education: Not on file   Highest education level: Not on file  Occupational History   Occupation: Full time    Employer: RETIRED  Tobacco Use   Smoking status: Never   Smokeless tobacco: Never  Vaping Use   Vaping Use: Never used  Substance and Sexual Activity   Alcohol use: No    Alcohol/week: 0.0 standard drinks   Drug use: No   Sexual activity: Yes    Birth control/protection: None  Other Topics Concern   Not on file  Social History Narrative   Not on file   Social Determinants of Radio broadcast assistant  Strain: Not on file  Food Insecurity: Not on file  Transportation Needs: Not on file  Physical Activity: Not on file  Stress: Not on file  Social Connections: Not on file     Family History:  The patient's family history includes CAD in his father.   Review of Systems:    Please see the history of present illness.     All other systems reviewed and are otherwise negative except as noted above.   Physical Exam:    VS:  BP (!) 144/80   Pulse 79   Ht 6' (1.829 m)   Wt 178 lb (80.7 kg)   SpO2 98%   BMI 24.14 kg/m    General: Well developed, well nourished,adult appearing in no acute distress. Head: Normocephalic, atraumatic. Neck: No carotid bruits. JVD not elevated.  Lungs: Respirations regular and unlabored, without wheezes or rales.  Heart: Regular rate and rhythm. No S3 or S4.  No murmur, no rubs, or gallops appreciated. Abdomen: Appears non-distended. No obvious abdominal masses. Msk:  Strength and tone appear normal for age. No obvious joint deformities or effusions. Extremities: No clubbing or cyanosis. No pitting edema.  Distal pedal pulses are 2+ bilaterally. Neuro: Alert and oriented X 3. Moves all  extremities spontaneously. No focal deficits noted. Psych:  Responds to questions appropriately with a normal affect. Skin: No rashes or lesions noted  Wt Readings from Last 3 Encounters:  12/12/20 178 lb (80.7 kg)  11/28/19 184 lb (83.5 kg)  11/03/19 181 lb (82.1 kg)     Studies/Labs Reviewed:   EKG:  EKG is ordered today.  The ekg ordered today demonstrates NSR, HR 75 with LAD and known RBBB.   Recent Labs: No results found for requested labs within last 8760 hours.   Lipid Panel    Component Value Date/Time   CHOL 125 03/02/2019 1039   TRIG 68 03/02/2019 1039   HDL 36 (L) 03/02/2019 1039   CHOLHDL 3.5 03/02/2019 1039   VLDL 14 03/02/2019 1039   [place] 75 03/02/2019 1039    Additional studies/ records that were reviewed today include:   NST: 11/2014 The study is normal. This is a low risk study. Nuclear stress EF: 63%.   Echocardiogram: 08/2017 Study Conclusions   - Left ventricle: The cavity size was normal. Wall thickness was    increased in a pattern of mild LVH. Systolic function was normal.    The estimated ejection fraction was in the range of 55% to 60%.    Wall motion was normal; there were no regional wall motion    abnormalities. Doppler parameters are consistent with abnormal    left ventricular relaxation (grade 1 diastolic dysfunction).    Doppler parameters are consistent with high ventricular filling    pressure.  - Aortic valve: Moderately calcified annulus. Trileaflet;    moderately thickened leaflets. There was mild regurgitation.    Valve area (VTI): 2.6 cm^2. Valve area (Vmax): 2.25 cm^2. Valve    area (Vmean): 1.81 cm^2.  - Mitral valve: Moderately calcified annulus. Mildly thickened    leaflets .  - Left atrium: The atrium was mildly to moderately dilated.  - Technically adequate study.    Assessment:    1. Coronary artery disease involving native coronary artery of native heart with other form of angina pectoris (HCC)   2. Chest  pain, unspecified type   3. Essential hypertension   4. Hyperlipidemia LDL goal <70   5. Bilateral carotid artery stenosis  Plan:   In order of problems listed above:  1. CAD/Chest Pain - He is s/p CABG in 1998 with DES to [place] in 2004 and restenosis by cath in 2009 with medical management recommended. Most recent ischemic evaluation was a low-risk NST in 2016. - His chest pain seems most consistent with a GI etiology given its constant nature and triggered by food consumption. His dyspnea on exertion and fatigue are more concerning for angina. He reports having recent labs by his PCP within the past 2 weeks and was informed everything was normal. Reviewed with the patient and will plan to obtain a Lexiscan Myoview for ischemic evaluation. If overall reassuring, would recommend he follow-up with GI as he previously underwent fundoplication. Will start Protonix and provide an updated Rx for SL NTG. He is aware to proceed to the ED if symptoms progress or are not relieved with SL NTG x3. Continue ASA, Atorvastatin and Coreg.   2. HTN - His BP is elevated at 144/80 during today's visit. Continue Coreg 6.25mg  BID for now and will continue to follow. If remains above goal, could further titrate Coreg or add an ARB.   3. HLD - Followed by the [facility] and his LDL was at 71 in 10/2019. He remains on Atorvastatin 20mg  daily. Will request most recent records from his PCP.    4. Carotid Artery Stenosis - Previously followed by his PCP and he reports having a copy of his records and prior dopplers showed approximately 60% stenosis bilaterally. Will order repeat carotid dopplers.  - He remains on ASA 81mg  daily and Atorvastatin 20mg  daily.    Medication Adjustments/Labs and Tests Ordered: Current medicines are reviewed at length with the patient today.  Concerns regarding medicines are outlined above.  Medication changes, Labs and Tests ordered today are listed in the Patient Instructions  below. Patient Instructions  Medication Instructions:  Your physician has recommended you make the following change in your medication:  START Protonixs 20 mg tablets daily Nitrostat 0.4 mg tablet SBLG  *If you need a refill on your cardiac medications before your next appointment, please call your pharmacy*   Lab Work: None If you have labs (blood work) drawn today and your tests are completely normal, you will receive your results only by: [place] (if you have MyChart) OR A paper copy in the mail If you have any lab test that is abnormal or we need to change your treatment, we will call you to review the results.   Testing/Procedures: Your physician has requested that you have a lexiscan myoview. For further information please visit [email or web address]. Please follow instruction sheet, as given.  Your physician has requested that you have a carotid duplex. This test is an ultrasound of the carotid arteries in your neck. It looks at blood flow through these arteries that supply the brain with blood. Allow one hour for this exam. There are no restrictions or special instructions.    Follow-Up: At [facility], you and your health needs are our priority.  As part of our continuing mission to provide you with exceptional heart care, we have created designated Provider Care Teams.  These Care Teams include your primary Cardiologist (physician) and Advanced Practice Providers (APPs -  Physician Assistants and Nurse Practitioners) who all work together to provide you with the care you need, when you need it.  We recommend signing up for the patient portal called "MyChart".  Sign up information is provided on this After Visit Summary.  MyChart is  used to connect with patients for Virtual Visits (Telemedicine).  Patients are able to view lab/test results, encounter notes, upcoming appointments, etc.  Non-urgent messages can be sent to your provider as well.   To learn more about what  you can do with MyChart, go to [email or web address].    Your next appointment:   4-6 week(s)  The format for your next appointment:   In Person  Provider:   Bernerd Pho, PA-C or APP   Other Instructions     Signed, Erma Heritage, PA-C  12/12/2020 7:29 PM    [facility] [address] [place], [place] [address] Phone: [phone] Fax: [phone]

## 2020-12-12 NOTE — Patient Instructions (Addendum)
Medication Instructions:  Your physician has recommended you make the following change in your medication:  START Protonixs 20 mg tablets daily Nitrostat 0.4 mg tablet SBLG  *If you need a refill on your cardiac medications before your next appointment, please call your pharmacy*   Lab Work: None If you have labs (blood work) drawn today and your tests are completely normal, you will receive your results only by: [place] (if you have MyChart) OR A paper copy in the mail If you have any lab test that is abnormal or we need to change your treatment, we will call you to review the results.   Testing/Procedures: Your physician has requested that you have a lexiscan myoview. For further information please visit [email or web address]. Please follow instruction sheet, as given.  Your physician has requested that you have a carotid duplex. This test is an ultrasound of the carotid arteries in your neck. It looks at blood flow through these arteries that supply the brain with blood. Allow one hour for this exam. There are no restrictions or special instructions.    Follow-Up: At [facility], you and your health needs are our priority.  As part of our continuing mission to provide you with exceptional heart care, we have created designated Provider Care Teams.  These Care Teams include your primary Cardiologist (physician) and Advanced Practice Providers (APPs -  Physician Assistants and Nurse Practitioners) who all work together to provide you with the care you need, when you need it.  We recommend signing up for the patient portal called "MyChart".  Sign up information is provided on this After Visit Summary.  MyChart is used to connect with patients for Virtual Visits (Telemedicine).  Patients are able to view lab/test results, encounter notes, upcoming appointments, etc.  Non-urgent messages can be sent to your provider as well.   To learn more about what you can do with MyChart, go to  [email or web address].    Your next appointment:   4-6 week(s)  The format for your next appointment:   In Person  Provider:   Bernerd Pho, PA-C or APP   Other Instructions

## 2020-12-13 MED ORDER — PANTOPRAZOLE SODIUM 20 MG PO TBEC: 20.0000 mg | DELAYED_RELEASE_TABLET | Freq: Every day | ORAL | 3 refills | Status: DC

## 2020-12-13 NOTE — Addendum Note (Signed)
Addended by: Berlinda Last on: 12/13/2020 07:23 AM   Modules accepted: Orders

## 2020-12-18 ENCOUNTER — Ambulatory Visit: Payer: Medicare Other | Admitting: Internal Medicine

## 2020-12-26 ENCOUNTER — Other Ambulatory Visit: Payer: Self-pay

## 2020-12-26 ENCOUNTER — Encounter (HOSPITAL_COMMUNITY)
Admission: RE | Admit: 2020-12-26 | Discharge: 2020-12-26 | Disposition: A | Discharge status: Home / Self Care | Payer: Medicare Other | Source: Ambulatory Visit | Attending: Student | Admitting: Student

## 2020-12-26 ENCOUNTER — Ambulatory Visit (HOSPITAL_COMMUNITY)
Admission: RE | Admit: 2020-12-26 | Discharge: 2020-12-26 | Disposition: A | Discharge status: Home / Self Care | Payer: Medicare Other | Source: Ambulatory Visit | Attending: Student | Admitting: Student

## 2020-12-26 DIAGNOSIS — R079 Chest pain, unspecified: Secondary | ICD-10-CM

## 2020-12-26 MED ORDER — TECHNETIUM TC 99M TETROFOSMIN IV KIT
10.0000 | PACK | Freq: Once | INTRAVENOUS | Status: AC | PRN
  Administered 2020-12-26: 11 via INTRAVENOUS

## 2020-12-26 MED ORDER — REGADENOSON 0.4 MG/5ML IV SOLN
INTRAVENOUS | Status: AC
  Administered 2020-12-26: 0.4 mg via INTRAVENOUS
  Filled 2020-12-26: qty 5

## 2020-12-26 MED ORDER — SODIUM CHLORIDE FLUSH 0.9 % IV SOLN
INTRAVENOUS | Status: AC
  Administered 2020-12-26: 10 mL via INTRAVENOUS
  Filled 2020-12-26: qty 10

## 2020-12-26 MED ORDER — TECHNETIUM TC 99M TETROFOSMIN IV KIT
30.0000 | PACK | Freq: Once | INTRAVENOUS | Status: AC | PRN
  Administered 2020-12-26: 30 via INTRAVENOUS

## 2020-12-27 LAB — NM MYOCAR MULTI W/SPECT W/WALL MOTION / EF
LV dias vol: 78 mL (ref 62–150)
LV sys vol: 33 mL (ref 21–61)
Peak HR: 88 {beats}/min
RATE: 0.31
Rest HR: 65 {beats}/min
SDS: 3
SRS: 0
SSS: 3
TID: 1.06

## 2020-12-31 ENCOUNTER — Telehealth: Payer: Self-pay

## 2020-12-31 NOTE — Telephone Encounter (Signed)
Pt received vm yesterday afternoon and walked into office for results. Pt verbalized understanding.

## 2020-12-31 NOTE — Telephone Encounter (Signed)
-----   Message from Erma Heritage, [place] sent at 12/27/2020  5:17 PM EDT ----- Kermit Balo news! Please let the patient know that his stress test showed evidence of his prior blockages (infarction) but no current ischemia or new blockages. Pumping function of the heart was normal. Overall, a low risk study. Given his reassuring study, I would recommend that he follow-up with GI as previously reviewed at the time of his office visit.

## 2021-01-03 ENCOUNTER — Ambulatory Visit: Payer: Medicare Other | Admitting: Student

## 2021-01-15 ENCOUNTER — Ambulatory Visit (INDEPENDENT_AMBULATORY_CARE_PROVIDER_SITE_OTHER): Payer: Medicare Other | Admitting: Internal Medicine

## 2021-01-15 ENCOUNTER — Other Ambulatory Visit: Payer: Self-pay

## 2021-01-15 ENCOUNTER — Encounter: Payer: Self-pay | Admitting: Internal Medicine

## 2021-01-15 VITALS — BP 144/78 | HR 77 | Temp 98.2°F | Resp 18 | Ht 72.0 in | Wt 175.8 lb

## 2021-01-15 DIAGNOSIS — K449 Diaphragmatic hernia without obstruction or gangrene: Secondary | ICD-10-CM | POA: Insufficient documentation

## 2021-01-15 DIAGNOSIS — N401 Enlarged prostate with lower urinary tract symptoms: Secondary | ICD-10-CM

## 2021-01-15 DIAGNOSIS — I10 Essential (primary) hypertension: Secondary | ICD-10-CM

## 2021-01-15 DIAGNOSIS — E559 Vitamin D deficiency, unspecified: Secondary | ICD-10-CM | POA: Insufficient documentation

## 2021-01-15 DIAGNOSIS — I6523 Occlusion and stenosis of bilateral carotid arteries: Secondary | ICD-10-CM

## 2021-01-15 DIAGNOSIS — E039 Hypothyroidism, unspecified: Secondary | ICD-10-CM

## 2021-01-15 DIAGNOSIS — Z951 Presence of aortocoronary bypass graft: Secondary | ICD-10-CM | POA: Insufficient documentation

## 2021-01-15 DIAGNOSIS — I6529 Occlusion and stenosis of unspecified carotid artery: Secondary | ICD-10-CM | POA: Insufficient documentation

## 2021-01-15 DIAGNOSIS — Z7689 Persons encountering health services in other specified circumstances: Secondary | ICD-10-CM

## 2021-01-15 DIAGNOSIS — M48062 Spinal stenosis, lumbar region with neurogenic claudication: Secondary | ICD-10-CM

## 2021-01-15 DIAGNOSIS — I2510 Atherosclerotic heart disease of native coronary artery without angina pectoris: Secondary | ICD-10-CM

## 2021-01-15 DIAGNOSIS — R269 Unspecified abnormalities of gait and mobility: Secondary | ICD-10-CM

## 2021-01-15 DIAGNOSIS — M48061 Spinal stenosis, lumbar region without neurogenic claudication: Secondary | ICD-10-CM | POA: Insufficient documentation

## 2021-01-15 DIAGNOSIS — M8580 Other specified disorders of bone density and structure, unspecified site: Secondary | ICD-10-CM | POA: Insufficient documentation

## 2021-01-15 DIAGNOSIS — K219 Gastro-esophageal reflux disease without esophagitis: Secondary | ICD-10-CM | POA: Diagnosis not present

## 2021-01-15 DIAGNOSIS — C4490 Unspecified malignant neoplasm of skin, unspecified: Secondary | ICD-10-CM | POA: Insufficient documentation

## 2021-01-15 DIAGNOSIS — Z961 Presence of intraocular lens: Secondary | ICD-10-CM | POA: Insufficient documentation

## 2021-01-15 DIAGNOSIS — R296 Repeated falls: Secondary | ICD-10-CM

## 2021-01-15 DIAGNOSIS — M25551 Pain in right hip: Secondary | ICD-10-CM | POA: Insufficient documentation

## 2021-01-15 DIAGNOSIS — I4510 Unspecified right bundle-branch block: Secondary | ICD-10-CM | POA: Insufficient documentation

## 2021-01-15 DIAGNOSIS — E7800 Pure hypercholesterolemia, unspecified: Secondary | ICD-10-CM

## 2021-01-15 DIAGNOSIS — M25552 Pain in left hip: Secondary | ICD-10-CM | POA: Insufficient documentation

## 2021-01-15 HISTORY — DX: Diaphragmatic hernia without obstruction or gangrene: K44.9

## 2021-01-15 NOTE — Assessment & Plan Note (Signed)
On Aspirin and statin

## 2021-01-15 NOTE — Assessment & Plan Note (Signed)
S/p lumbar laminectomy in 2019, has been having difficulty walking and gait imbalance Follows up with Spine surgery, gets steroid injections Has had 3 falls in the last 6 months Referred to PT for strength/gait training

## 2021-01-15 NOTE — Assessment & Plan Note (Signed)
On Levothyroxine 50 mcg QD Check TSH and free T4

## 2021-01-15 NOTE — Patient Instructions (Addendum)
Please stop taking Metformin.  Please continue taking other medications as prescribed.  Please get fasting blood tests done within a week.

## 2021-01-15 NOTE — Assessment & Plan Note (Signed)
Care established History and medications reviewed with the patient 

## 2021-01-15 NOTE — Assessment & Plan Note (Signed)
On Pantoprazole 

## 2021-01-15 NOTE — Assessment & Plan Note (Signed)
On statin Check lipid profile 

## 2021-01-15 NOTE — Assessment & Plan Note (Signed)
CABG in 12/98; DES to the RCA SVG in 07/2002; restenosis SVG stent and re-intervention in 2006; 70% residual D1; cath in 11/2007: TO SVG M1  Followed by Cardiology On Aspirin, statin and Coreg Denies any anginal symptoms currently 

## 2021-01-15 NOTE — Assessment & Plan Note (Signed)
BP Readings from Last 1 Encounters:  01/15/21 (!) 144/78   Overall well-controlled with Coreg, considering his age 85 for compliance with the medications Advised DASH diet and moderate exercise/walking as tolerated

## 2021-01-15 NOTE — Assessment & Plan Note (Signed)
Has had prostate surgery, followed by Urology Was on Tamsulosin in the past

## 2021-01-15 NOTE — Progress Notes (Signed)
New Patient Office Visit  Subjective:  Patient ID: Jorge Bishop, male    DOB: June 06, 1936  Age: 85 y.o. MRN: [id number]  CC:  Chief Complaint  Patient presents with   New Patient (Initial Visit)    New patient was seeing dr Cindie Laroche has been really unsteady on his feet since a surgery in 2019     HPI Jorge Bishop is an 85 year old male with past medical history of CAD s/p CABG, hypertension, HLD, prediabetes, hypothyroidism, GERD, lumbar spinal stenosis s/p lumbar laminectomy and BPH who presents for establishing care.  CAD s/p CABG and hypertension: BP is well-controlled. Takes medications regularly. Patient denies headache, dizziness, chest pain, dyspnea or palpitations.  He follows up with cardiology.  He is on Coreg, aspirin and atorvastatin.  S/p lumbar laminectomy: Around 2019.  He has been having gait problems since then.  He has had 3 falls in the last 6 months.  He also has bilateral LE numbness and tingling, which is intermittent.  He also reports history of peripheral neuropathy.  He follows up with spine surgery for steroid injections.  BPH: Followed by urology.  Was on tamsulosin and Rapaflo in the past.  Has had TURP in the past.  Denies any dysuria or hematuria, but complains of weak urinary stream.  He is up-to-date with COVID vaccines.  He also follows up with [facility].    Past Medical History:  Diagnosis Date   Arteriosclerotic cardiovascular disease (ASCVD)    Coronary artery bypass graft surgery in 12/98; DES to the RCA SVG in 07/2002; restenosis of the saphenous vein graft stent and renal intervention in 2006; 70% first diagonal present; calf in 11/2007-total obstruction of the saphenous vein graft to the first marginal   Benign prostatic hypertrophy    Cerebrovascular disease    60% bilateral internal carotid artery stenosis in 2012   Coronary artery disease    4 stents   Diaphragmatic hernia 01/15/2021   Diverticulosis    DJD (degenerative joint disease)     Erectile dysfunction    GERD (gastroesophageal reflux disease)    History of blood transfusion    History of kidney stones    Hyperlipidemia    Lipid profile in 12/2009:104, 91, 36, 50   Hypertension    Kidney stone 09/08/2017   Migraine headache    Right bundle branch block    Right ureteral stone 03/08/2019   Shortness of breath    Thrombocytopenia (HCC)    platelets of 104 in 03/2009   Upper gastrointestinal hemorrhage 96/22    Helicobacter pylori positive; subsequent laparoscopic fundoplication-2009    Past Surgical History:  Procedure Laterality Date   CARDIAC CATHETERIZATION     4 cardiac stents   CATARACT EXTRACTION W/PHACO Right 07/25/2012   Procedure: CATARACT EXTRACTION PHACO AND INTRAOCULAR LENS PLACEMENT ([place]);  Surgeon: Tonny Branch, MD;  [facility]: [facility];  Service: Ophthalmology;  Laterality: Right;  CDE:23.31   CATARACT EXTRACTION W/PHACO Left 08/18/2012   Procedure: CATARACT EXTRACTION PHACO AND INTRAOCULAR LENS PLACEMENT ([facility]);  Surgeon: Tonny Branch, MD;  Location: [facility];  Service: Ophthalmology;  Laterality: Left;  CDE: 16.07   COLONOSCOPY N/A 08/01/2014   Procedure: COLONOSCOPY;  Surgeon: Rogene Houston, MD;  [facility];  Service: Endoscopy;  Laterality: N/A;  125 - moved to 10:30 - Ann to notify pt   CORONARY ARTERY BYPASS GRAFT  1998    CYSTOSCOPY/URETEROSCOPY/HOLMIUM LASER/STENT PLACEMENT Right 11/12/2017   Procedure: CYSTOSCOPY/RIGHT RETROGRADE PYELOGRAM/RIGHT URETEROSCOPY/HOLMIUM LASER LITHOTRIPSY  RIGHT URETERAL CALCULUS/RIGHT URETERAL STENT PLACEMENT;  Surgeon: [name], MD;  Location: [facility];  Service: Urology;  Laterality: Right;  right ureteral calculus given to patient's wife per Dr. [name]   CYSTOSCOPY/URETEROSCOPY/HOLMIUM LASER/STENT PLACEMENT Right 03/07/2019   Procedure: CYSTOSCOPY RIGHT RETROGRADE RIGHT URETEROSCOPY;  Surgeon: [name], MD;  Location: [facility];  Service: Urology;  Laterality: Right;   EXTRACORPOREAL SHOCK WAVE LITHOTRIPSY Right  09/13/2017   Procedure: RIGHT EXTRACORPOREAL SHOCK WAVE LITHOTRIPSY (ESWL);  Surgeon: Lucas Mallow, MD;  Location: [facility];  Service: Urology;  Laterality: Right;   HERNIA REPAIR     right inguinal   LAPAROSCOPIC NISSEN FUNDOPLICATION  9038, 3338    revision in 2009 along with repair of paraesophagel hernia    PARAESOPHAGEAL HERNIA REPAIR     TRANSURETHRAL RESECTION OF PROSTATE N/A 03/07/2019   Procedure: TRANSURETHRAL RESECTION OF THE PROSTATE (TURP);  Surgeon: [name], MD;  [facility];  Service: Urology;  Laterality: N/A;    Family History  Problem Relation Age of Onset   CAD Father     Social History   Socioeconomic History   Marital status: Married    Spouse name: Not on file   Number of children: Not on file   Years of education: Not on file   Highest education level: Not on file  Occupational History   Occupation: Full time    Employer: RETIRED  Tobacco Use   Smoking status: Never   Smokeless tobacco: Never  Vaping Use   Vaping Use: Never used  Substance and Sexual Activity   Alcohol use: No    Alcohol/week: 0.0 standard drinks   Drug use: No   Sexual activity: Yes    Birth control/protection: None  Other Topics Concern   Not on file  Social History Narrative   Not on file   Social Determinants of Health   Financial Resource Strain: Not on file  Food Insecurity: Not on file  Transportation Needs: Not on file  Physical Activity: Not on file  Stress: Not on file  Social Connections: Not on file  Intimate Partner Violence: Not on file    ROS Review of Systems  Constitutional:  Negative for chills and fever.  HENT:  Negative for congestion and sore throat.   Eyes:  Negative for pain and discharge.  Respiratory:  Negative for cough and shortness of breath.   Cardiovascular:  Negative for chest pain and palpitations.  Gastrointestinal:  Negative for constipation, diarrhea, nausea and vomiting.  Endocrine: Negative for polydipsia and polyuria.   Genitourinary:  Negative for dysuria and hematuria.  Musculoskeletal:  Positive for arthralgias, back pain and gait problem. Negative for neck pain and neck stiffness.  Skin:  Negative for rash.  Neurological:  Negative for dizziness, weakness, numbness and headaches.  Psychiatric/Behavioral:  Negative for agitation and behavioral problems.    Objective:   Today's Vitals: BP (!) 144/78 (BP Location: Right Arm, Patient Position: Sitting, Cuff Size: Normal)   Pulse 77   Temp 98.2 F (36.8 C) (Oral)   Resp 18   Ht 6' (1.829 m)   Wt 175 lb 12.8 oz (79.7 kg)   SpO2 98%   BMI 23.84 kg/m   Physical Exam Vitals reviewed.  Constitutional:      General: He is not in acute distress.    Appearance: He is not diaphoretic.  HENT:     Head: Normocephalic and atraumatic.     Nose: Nose normal.     Mouth/Throat:  Mouth: Mucous membranes are moist.  Eyes:     General: No scleral icterus.    Extraocular Movements: Extraocular movements intact.  Cardiovascular:     Rate and Rhythm: Normal rate and regular rhythm.     Pulses: Normal pulses.     Heart sounds: Normal heart sounds. No murmur heard. Pulmonary:     Breath sounds: Normal breath sounds. No wheezing or rales.  Abdominal:     Palpations: Abdomen is soft.     Tenderness: There is no abdominal tenderness.  Musculoskeletal:     Cervical back: Neck supple. No tenderness.     Right lower leg: No edema.     Left lower leg: No edema.  Skin:    General: Skin is warm and dry.  Neurological:     General: No focal deficit present.     Mental Status: He is alert and oriented to person, place, and time.  Psychiatric:        Mood and Affect: Mood normal.        Behavior: Behavior normal.    Assessment & Plan:   Problem List Items Addressed This Visit       Cardiovascular and Mediastinum   Hypertension    BP Readings from Last 1 Encounters:  01/15/21 (!) 144/78  Overall well-controlled with Coreg, considering his  age 74 for compliance with the medications Advised DASH diet and moderate exercise/walking as tolerated       Relevant Orders   CMP14+EGFR   Lipid Profile   CAD (coronary artery disease)    CABG in 12/98; DES to the RCA SVG in 07/2002; restenosis SVG stent and re-intervention in 2006; 70% residual D1; cath in 11/2007: TO SVG M1  Followed by Cardiology On Aspirin, statin and Coreg Denies any anginal symptoms currently       Asymptomatic carotid artery stenosis    On Aspirin and statin         Digestive   Gastroesophageal reflux disease    On Pantoprazole         Endocrine   Hypothyroidism    On Levothyroxine 50 mcg QD Check TSH and free T4       Relevant Orders   TSH + free T4     Genitourinary   Benign localized prostatic hyperplasia with lower urinary tract symptoms (LUTS)    Has had prostate surgery, followed by Urology Was on Tamsulosin in the past         Other   Hyperlipidemia    On statin Check lipid profile       Relevant Orders   Lipid Profile   History of coronary artery bypass graft   Spinal stenosis of lumbar region    S/p lumbar laminectomy in 2019, has been having difficulty walking and gait imbalance Follows up with Spine surgery, gets steroid injections Has had 3 falls in the last 6 months Referred to PT for strength/gait training       Relevant Orders   Ambulatory referral to Physical Therapy   Encounter to establish care - Primary    Care established History and medications reviewed with the patient       Relevant Orders   CMP14+EGFR   Other Visit Diagnoses     Gait difficulty       Relevant Orders   Ambulatory referral to Physical Therapy   Recurrent falls       Relevant Orders   Ambulatory referral to Physical Therapy       Outpatient  Encounter Medications as of 01/15/2021  Medication Sig   aspirin 81 MG tablet Take 81 mg by mouth daily.     atorvastatin (LIPITOR) 20 MG tablet Take 20 mg by mouth at  bedtime.     carvedilol (COREG) 12.5 MG tablet Take 6.25 mg by mouth 2 (two) times daily with a meal.   Cholecalciferol (VITAMIN D3) 50 MCG (2000 UT) TABS Take 10,000 Units by mouth daily.    FIBER PO Take 2 capsules by mouth daily as needed (constipation).   fluorometholone (FML) 0.1 % ophthalmic suspension SMARTSIG:1 Drop(s) In Eye(s) 2-4 Times Daily PRN   nitroGLYCERIN (NITROSTAT) 0.4 MG SL tablet Place 1 tablet (0.4 mg total) under the tongue every 5 (five) minutes as needed for chest pain.   ondansetron (ZOFRAN) 4 MG tablet Take 4 mg by mouth every 8 (eight) hours as needed for nausea or vomiting.    pantoprazole (PROTONIX) 20 MG tablet Take 1 tablet (20 mg total) by mouth daily.   promethazine (PHENERGAN) 25 MG tablet Take 25 mg by mouth every 6 (six) hours as needed for nausea or vomiting.   SYNTHROID 50 MCG tablet Take 50 mcg by mouth daily.   traMADol (ULTRAM) 50 MG tablet Take 50 mg by mouth every 6 (six) hours as needed (pain).   No facility-administered encounter medications on file as of 01/15/2021.    Follow-up: Return in about 4 months (around 05/17/2021) for HTN and hypothyrodism.   Lindell Spar, MD

## 2021-01-17 LAB — LIPID PANEL
Chol/HDL Ratio: 3.3 ratio (ref 0.0–5.0)
Cholesterol, Total: 126 mg/dL (ref 100–199)
HDL: 38 mg/dL — ABNORMAL LOW (ref >39)
LDL Chol Calc (NIH): 76 mg/dL (ref 0–99)
Triglycerides: 53 mg/dL (ref 0–149)
VLDL Cholesterol Cal: 12 mg/dL (ref 5–40)

## 2021-01-17 LAB — CMP14+EGFR
ALT: 15 IU/L (ref 0–44)
AST: 19 IU/L (ref 0–40)
Albumin/Globulin Ratio: 2 (ref 1.2–2.2)
Albumin: 4.2 g/dL (ref 3.6–4.6)
Alkaline Phosphatase: 54 IU/L (ref 44–121)
BUN/Creatinine Ratio: 22 (ref 10–24)
BUN: 23 mg/dL (ref 8–27)
Bilirubin Total: 0.7 mg/dL (ref 0.0–1.2)
CO2: 22 mmol/L (ref 20–29)
Calcium: 9.2 mg/dL (ref 8.6–10.2)
Chloride: 104 mmol/L (ref 96–106)
Creatinine, Ser: 1.05 mg/dL (ref 0.76–1.27)
Globulin, Total: 2.1 g/dL (ref 1.5–4.5)
Glucose: 110 mg/dL — ABNORMAL HIGH (ref 65–99)
Potassium: 4.6 mmol/L (ref 3.5–5.2)
Sodium: 141 mmol/L (ref 134–144)
Total Protein: 6.3 g/dL (ref 6.0–8.5)
eGFR: 70 mL/min/{1.73_m2} (ref >59)

## 2021-01-17 LAB — TSH+FREE T4
Free T4: 1.66 ng/dL (ref 0.82–1.77)
TSH: 1.77 u[IU]/mL (ref 0.450–4.500)

## 2021-01-21 ENCOUNTER — Encounter: Payer: Self-pay | Admitting: *Deleted

## 2021-01-27 ENCOUNTER — Telehealth: Payer: Self-pay

## 2021-01-27 NOTE — Telephone Encounter (Signed)
Please have him call us when he is running low and we will send in 90 day supply refills

## 2021-01-27 NOTE — Telephone Encounter (Signed)
Patient stopped by for future reference 90 day supply instead of 30 day supply to [facility]

## 2021-01-27 NOTE — Telephone Encounter (Signed)
Contacted patient.

## 2021-01-30 ENCOUNTER — Ambulatory Visit (INDEPENDENT_AMBULATORY_CARE_PROVIDER_SITE_OTHER): Payer: Medicare Other

## 2021-01-30 ENCOUNTER — Other Ambulatory Visit: Payer: Self-pay

## 2021-01-30 DIAGNOSIS — I6523 Occlusion and stenosis of bilateral carotid arteries: Secondary | ICD-10-CM | POA: Diagnosis not present

## 2021-01-31 ENCOUNTER — Telehealth: Payer: Self-pay | Admitting: Student

## 2021-01-31 NOTE — Telephone Encounter (Signed)
Patient got all the results from [facility] and he is happy with them , if you need to speak to him call him on his cell phone

## 2021-02-03 NOTE — Telephone Encounter (Signed)
Noted  

## 2021-02-05 ENCOUNTER — Ambulatory Visit (HOSPITAL_COMMUNITY): Payer: Medicare Other | Attending: Internal Medicine | Admitting: Physical Therapy

## 2021-02-05 ENCOUNTER — Other Ambulatory Visit: Payer: Self-pay

## 2021-02-05 DIAGNOSIS — M6281 Muscle weakness (generalized): Secondary | ICD-10-CM | POA: Diagnosis present

## 2021-02-05 DIAGNOSIS — M5441 Lumbago with sciatica, right side: Secondary | ICD-10-CM | POA: Insufficient documentation

## 2021-02-05 DIAGNOSIS — G8929 Other chronic pain: Secondary | ICD-10-CM | POA: Diagnosis present

## 2021-02-05 DIAGNOSIS — R296 Repeated falls: Secondary | ICD-10-CM

## 2021-02-05 NOTE — Therapy (Signed)
[facility] [address] [place], [place], [address] Phone: [phone]   Fax:  [phone]  Physical Therapy Evaluation  Patient Details  Name: Jorge Bishop MRN: [id number] Date of Birth: [birth year] Referring Provider (PT): Ihor Dow   Encounter Date: 02/05/2021   PT End of Session - 02/05/21 1528     Visit Number 1    Number of Visits 8    Date for PT Re-Evaluation 03/07/21    Authorization Type Medicare    PT Start Time 1450    PT Stop Time 1525    PT Time Calculation (min) 35 min    Equipment Utilized During Treatment Gait belt    Activity Tolerance Patient tolerated treatment well    Behavior During Therapy [facility] for tasks assessed/performed             Past Medical History:  Diagnosis Date   Arteriosclerotic cardiovascular disease (ASCVD)    Coronary artery bypass graft surgery in 12/98; DES to the RCA SVG in 07/2002; restenosis of the saphenous vein graft stent and renal intervention in 2006; 70% first diagonal present; calf in 11/2007-total obstruction of the saphenous vein graft to the first marginal   Benign prostatic hypertrophy    Cerebrovascular disease    60% bilateral internal carotid artery stenosis in 2012   Coronary artery disease    4 stents   Diaphragmatic hernia 01/15/2021   Diverticulosis    DJD (degenerative joint disease)    Erectile dysfunction    GERD (gastroesophageal reflux disease)    History of blood transfusion    History of kidney stones    Hyperlipidemia    Lipid profile in 12/2009:104, 91, 36, 50   Hypertension    Kidney stone 09/08/2017   Migraine headache    Right bundle branch block    Right ureteral stone 03/08/2019   Shortness of breath    Thrombocytopenia (HCC)    platelets of 104 in 03/2009   Upper gastrointestinal hemorrhage [id number]    Helicobacter pylori positive; subsequent laparoscopic fundoplication-2009    Past Surgical History:  Procedure Laterality Date   CARDIAC  CATHETERIZATION     4 cardiac stents   CATARACT EXTRACTION W/PHACO Right 07/25/2012   Procedure: CATARACT EXTRACTION PHACO AND INTRAOCULAR LENS PLACEMENT ([place]);  Surgeon: Tonny Branch, MD;  Location: [facility];  Service: Ophthalmology;  Laterality: Right;  CDE:23.31   CATARACT EXTRACTION W/PHACO Left 08/18/2012   Procedure: CATARACT EXTRACTION PHACO AND INTRAOCULAR LENS PLACEMENT ([facility]);  Surgeon: Tonny Branch, MD;  Location: [facility];  Service: Ophthalmology;  Laterality: Left;  CDE: 16.07   COLONOSCOPY N/A 08/01/2014   Procedure: COLONOSCOPY;  Surgeon: Rogene Houston, MD;  [facility];  Service: Endoscopy;  Laterality: N/A;  125 - moved to 10:30 - Ann to notify pt   CORONARY ARTERY BYPASS GRAFT  1998    CYSTOSCOPY/URETEROSCOPY/HOLMIUM LASER/STENT PLACEMENT Right 11/12/2017   Procedure: CYSTOSCOPY/RIGHT RETROGRADE PYELOGRAM/RIGHT URETEROSCOPY/HOLMIUM LASER LITHOTRIPSY RIGHT URETERAL CALCULUS/RIGHT URETERAL STENT PLACEMENT;  Surgeon: [name], MD;  Location: [facility];  Service: Urology;  Laterality: Right;  right ureteral calculus given to patient's wife per Dr. [name]   CYSTOSCOPY/URETEROSCOPY/HOLMIUM LASER/STENT PLACEMENT Right 03/07/2019   Procedure: CYSTOSCOPY RIGHT RETROGRADE RIGHT URETEROSCOPY;  Surgeon: [name], MD;  [facility];  Service: Urology;  Laterality: Right;   EXTRACORPOREAL SHOCK WAVE LITHOTRIPSY Right 09/13/2017   Procedure: RIGHT EXTRACORPOREAL SHOCK WAVE LITHOTRIPSY (ESWL);  Surgeon: Lucas Mallow, MD;  Location[facility];  Service:  Urology;  Laterality: Right;   HERNIA REPAIR     right inguinal   LAPAROSCOPIC NISSEN FUNDOPLICATION  0000000, [id number]    revision in 2009 along with repair of paraesophagel hernia    PARAESOPHAGEAL HERNIA REPAIR     TRANSURETHRAL RESECTION OF PROSTATE N/A 03/07/2019   Procedure: TRANSURETHRAL RESECTION OF THE PROSTATE (TURP);  Surgeon: [name], MD;  [facility];  Service: Urology;  Laterality: N/A;    There were no vitals filed for  this visit.    Subjective Assessment - 02/05/21 1500     Subjective Jorge Bishop states that he had surgery on his back in August 2019 and has had trouble ever since. He states that his main concern is that he can not get up from the floor. He has difficulty with steps and is unable to carry something and walk at the same time.    Pertinent History CABG, back surgery, TURP    Patient Stated Goals To be able to get up from the floor.  To be able to go up and down steps, To be able to carry something and walk.    Currently in Pain? No/denies   Worst pain is riding a riding mower :  7/10   Pain Location Back    Pain Descriptors / Indicators Aching    Pain Radiating Towards pain will radiate down the pt RT leg to his toes.    Pain Onset More than a month ago    Pain Frequency Intermittent    Aggravating Factors  riding on riding mower.                [facility] PT Assessment - 02/05/21 0001       Assessment   Medical Diagnosis Spinal stenosis    Referring Provider (PT) Ihor Dow    Prior Therapy years ago      Precautions   Precautions Fall      Restrictions   Weight Bearing Restrictions No      Balance Screen   Has the patient fallen in the past 6 months --   to many to count   Has the patient had a decrease in activity level because of a fear of falling?  Yes    Is the patient reluctant to leave their home because of a fear of falling?  No      Home Environment   Living Environment Private residence    Type of [place] to enter    Home Layout Laundry or work area in basement      Prior Function   Level of Independence Independent      Cognition   Overall Cognitive Status Within Functional Limits for tasks assessed      Observation/Other Assessments   Focus on Therapeutic Outcomes (FOTO)  54; 46% affected      Functional Tests   Functional tests Single leg stance;Sit to Stand      Single Leg Stance   Comments 6" on RT; LT 8'      Sit to  Stand   Comments unable to rise without using his hands.      Posture/Postural Control   Posture Comments flat back; extension limited to 15 degrees      ROM / Strength   AROM / PROM / Strength Strength      Strength   Strength Assessment Site Hip;Knee;Ankle    Right/Left Hip Right;Left    Right Hip Flexion 5/5  Right Hip Extension 4/5    Right Hip ABduction 4/5    Left Hip Flexion 5/5    Left Hip Extension 4/5    Left Hip ABduction 4+/5    Right/Left Knee Right;Left    Right Knee Extension 5/5    Left Knee Extension 5/5    Right/Left Ankle Right;Left    Right Ankle Dorsiflexion 3+/5    Left Ankle Dorsiflexion 3+/5                        Objective measurements completed on examination: See above findings.       [facility] Adult PT Treatment/Exercise - 02/05/21 0001       Exercises   Exercises Knee/Hip      Knee/Hip Exercises: Standing   Functional Squat 10 reps      Knee/Hip Exercises: Seated   Other Seated Knee/Hip Exercises toe raises x 10      Knee/Hip Exercises: Supine   Bridges 10 reps                    PT Education - 02/05/21 1528     Education Details HEP    Person(s) Educated Patient    Methods Explanation;Handout    Comprehension Verbalized understanding              PT Short Term Goals - 02/05/21 1627       PT SHORT TERM GOAL #1   Title Pt will be independent with HEP to improve core and LE strength    Time 2    Period Weeks    Status New    Target Date 02/19/21               PT Long Term Goals - 02/05/21 1628       PT LONG TERM GOAL #1   Title Pt will be independent with advanced HEP for core stabilization to be able to come sit to stand without UE assist.    Time 4    Period Weeks    Status New    Target Date 03/05/21      PT LONG TERM GOAL #2   Title Pt to be able to single leg stance on both Rt and Lt LE for at least 15 seconds to allow pt to state that he has not fallen in the past week.     Time 4    Period Weeks    Status New      PT LONG TERM GOAL #3   Title PT to be able to I get from floor to a chair for recovery strategies if pt falls and there is nobody home.    Time 4    Period Weeks    Status New                    Plan - 02/05/21 1530     Clinical Impression Statement Jorge Bishop is an 85 yo male who has spinal stenosis.  He had a laminectomy in 2019 and states he has not ever been the same.  He went to his MD due to falling and having difficulty standing up, his goal is to be able to get up off the floor on his own. He has currently been referred to skilled PT.  Evaluation demonstrates decreased balance, decreased strength and Pain.  Jorge Bishop will benefit from skilled PT to address these problems and decrease his falls to reduce the risk  of injury.    Personal Factors and Comorbidities Age;Comorbidity 3+    Comorbidities vertigo, spinal stenosis, back surgery    Examination-Activity Limitations Carry;Lift;Transfers;Stairs;Squat    Examination-Participation Restrictions Cleaning;Yard Work    Stability/Clinical Decision Making Stable/Uncomplicated    Clinical Decision Making Moderate    Rehab Potential Good    PT Frequency 2x / week    PT Duration 4 weeks    PT Treatment/Interventions Balance training;Therapeutic exercise;Neuromuscular re-education;Stair training;Gait training    PT Next Visit Plan Progress balance, instruct in knee to chest exercise, begin sit to stand from raised bed at a height pt is ableto complete I,    PT Home Exercise Plan functional squat, bridge, Toe raise sitting             Patient will benefit from skilled therapeutic intervention in order to improve the following deficits and impairments:  Abnormal gait, Decreased balance, Decreased range of motion, Decreased strength, Pain, Postural dysfunction  Visit Diagnosis: Repeated falls - Plan: PT plan of care cert/re-cert  Chronic bilateral low back pain with right-sided  sciatica - Plan: PT plan of care cert/re-cert  Muscle weakness (generalized) - Plan: PT plan of care cert/re-cert     Problem List Patient Active Problem List   Diagnosis Date Noted   Pain of both hip joints 01/15/2021   History of coronary artery bypass graft 01/15/2021   Asymptomatic carotid artery stenosis 01/15/2021   Bilateral pseudophakia 01/15/2021   Gastroesophageal reflux disease 01/15/2021   Osteopenia 01/15/2021   Right bundle branch block 01/15/2021   Skin cancer 01/15/2021   Spinal stenosis of lumbar region 01/15/2021   Vitamin D deficiency 01/15/2021   Hypothyroidism 01/15/2021   Encounter to establish care 01/15/2021   Benign localized prostatic hyperplasia with lower urinary tract symptoms (LUTS) 03/07/2019   Protein-calorie malnutrition, severe 09/11/2017   Hyperlipidemia    Cerebrovascular disease    Thrombocytopenia (HCC)    Erectile dysfunction    CAD (coronary artery disease)    Hypertension 01/16/2009   Rayetta Humphrey, PT CLT [phone]  02/05/2021, 4:32 PM  Jorge Bishop, [place], [address] Phone: [phone]   Fax:  [phone]  Name: Jorge Bishop MRN: [id number] Date of Birth: 07-04-1935

## 2021-02-10 ENCOUNTER — Encounter (HOSPITAL_COMMUNITY): Payer: Self-pay | Admitting: Physical Therapy

## 2021-02-10 ENCOUNTER — Other Ambulatory Visit: Payer: Self-pay

## 2021-02-10 ENCOUNTER — Ambulatory Visit (HOSPITAL_COMMUNITY): Payer: Medicare Other | Admitting: Physical Therapy

## 2021-02-10 DIAGNOSIS — M5441 Lumbago with sciatica, right side: Secondary | ICD-10-CM

## 2021-02-10 DIAGNOSIS — R296 Repeated falls: Secondary | ICD-10-CM | POA: Diagnosis not present

## 2021-02-10 DIAGNOSIS — G8929 Other chronic pain: Secondary | ICD-10-CM

## 2021-02-10 DIAGNOSIS — M6281 Muscle weakness (generalized): Secondary | ICD-10-CM

## 2021-02-10 NOTE — Therapy (Signed)
[facility] [address] [place], [place], [address] Phone: [phone]   Fax:  [phone]  Physical Therapy Treatment  Patient Details  Name: Jorge Bishop MRN: [id number] Date of Birth: 11-May-1936 Referring Provider (PT): Ihor Dow   Encounter Date: 02/10/2021   PT End of Session - 02/10/21 1106     Visit Number 2    Number of Visits 8    Date for PT Re-Evaluation 03/07/21    Authorization Type Medicare    PT Start Time [id number]    PT Stop Time 1125    PT Time Calculation (min) 40 min    Equipment Utilized During Treatment Gait belt    Activity Tolerance Patient tolerated treatment well    Behavior During Therapy [facility] for tasks assessed/performed             Past Medical History:  Diagnosis Date   Arteriosclerotic cardiovascular disease (ASCVD)    Coronary artery bypass graft surgery in 12/98; DES to the RCA SVG in 07/2002; restenosis of the saphenous vein graft stent and renal intervention in 2006; 70% first diagonal present; calf in 11/2007-total obstruction of the saphenous vein graft to the first marginal   Benign prostatic hypertrophy    Cerebrovascular disease    60% bilateral internal carotid artery stenosis in 2012   Coronary artery disease    4 stents   Diaphragmatic hernia 01/15/2021   Diverticulosis    DJD (degenerative joint disease)    Erectile dysfunction    GERD (gastroesophageal reflux disease)    History of blood transfusion    History of kidney stones    Hyperlipidemia    Lipid profile in 12/2009:104, 91, 36, 50   Hypertension    Kidney stone 09/08/2017   Migraine headache    Right bundle branch block    Right ureteral stone 03/08/2019   Shortness of breath    Thrombocytopenia (HCC)    platelets of 104 in 03/2009   Upper gastrointestinal hemorrhage [id number]    Helicobacter pylori positive; subsequent laparoscopic fundoplication-2009    Past Surgical History:  Procedure Laterality Date   CARDIAC  CATHETERIZATION     4 cardiac stents   CATARACT EXTRACTION W/PHACO Right 07/25/2012   Procedure: CATARACT EXTRACTION PHACO AND INTRAOCULAR LENS PLACEMENT ([place]);  Surgeon: Tonny Branch, MD;  Location: [facility];  Service: Ophthalmology;  Laterality: Right;  CDE:23.31   CATARACT EXTRACTION W/PHACO Left 08/18/2012   Procedure: CATARACT EXTRACTION PHACO AND INTRAOCULAR LENS PLACEMENT ([facility]);  Surgeon: Tonny Branch, MD;  Location: [facility];  Service: Ophthalmology;  Laterality: Left;  CDE: 16.07   COLONOSCOPY N/A 08/01/2014   Procedure: COLONOSCOPY;  Surgeon: Rogene Houston, MD;  Location: [facility];  Service: Endoscopy;  Laterality: N/A;  125 - moved to 10:30 - Ann to notify pt   CORONARY ARTERY BYPASS GRAFT  1998    CYSTOSCOPY/URETEROSCOPY/HOLMIUM LASER/STENT PLACEMENT Right 11/12/2017   Procedure: CYSTOSCOPY/RIGHT RETROGRADE PYELOGRAM/RIGHT URETEROSCOPY/HOLMIUM LASER LITHOTRIPSY RIGHT URETERAL CALCULUS/RIGHT URETERAL STENT PLACEMENT;  Surgeon: [name], MD;  Location: [facility];  Service: Urology;  Laterality: Right;  right ureteral calculus given to patient's wife per Dr. [name]   CYSTOSCOPY/URETEROSCOPY/HOLMIUM LASER/STENT PLACEMENT Right 03/07/2019   Procedure: CYSTOSCOPY RIGHT RETROGRADE RIGHT URETEROSCOPY;  Surgeon: [name], MD;  [facility];  Service: Urology;  Laterality: Right;   EXTRACORPOREAL SHOCK WAVE LITHOTRIPSY Right 09/13/2017   Procedure: RIGHT EXTRACORPOREAL SHOCK WAVE LITHOTRIPSY (ESWL);  Surgeon: Lucas Mallow, MD;  Location: [facility];  Service:  Urology;  Laterality: Right;   HERNIA REPAIR     right inguinal   LAPAROSCOPIC NISSEN FUNDOPLICATION  0000000, [id number]    revision in 2009 along with repair of paraesophagel hernia    PARAESOPHAGEAL HERNIA REPAIR     TRANSURETHRAL RESECTION OF PROSTATE N/A 03/07/2019   Procedure: TRANSURETHRAL RESECTION OF THE PROSTATE (TURP);  Surgeon: [name], MD;  [facility];  Service: Urology;  Laterality: N/A;    There were no vitals filed for  this visit.   Subjective Assessment - 02/10/21 1046     Subjective Pt states that he was sore from the exercises that he was given.    Pertinent History CABG, back surgery, TURP    Patient Stated Goals To be able to get up from the floor.  To be able to go up and down steps, To be able to carry something and walk.    Currently in Pain? No/denies    Pain Onset More than a month ago                               [facility] Adult PT Treatment/Exercise - 02/10/21 0001       Exercises   Exercises Knee/Hip      Knee/Hip Exercises: Stretches   Other Knee/Hip Stretches knee to chest 30" x 3      Knee/Hip Exercises: Standing   Functional Squat 15 reps      Knee/Hip Exercises: Seated   Other Seated Knee/Hip Exercises scapular retraction; cervical retraction      Knee/Hip Exercises: Supine   Bridges 10 reps                 Balance Exercises - 02/10/21 0001       Balance Exercises: Standing   Tandem Stance Eyes open;5 reps   turning head   SLS Eyes open;3 reps    Toe Raise 10 reps    Sit to Stand Standard surface    Other Standing Exercises wall reach x 10; standing against the wall flexion B do not let back go away from wall x 10    Other Standing Exercises Comments sitting scapular and cervical retraction x 10                 PT Short Term Goals - 02/10/21 1059       PT SHORT TERM GOAL #1   Title Pt will be independent with HEP to improve core and LE strength    Time 2    Period Weeks    Status On-going    Target Date 02/19/21               PT Long Term Goals - 02/10/21 1100       PT LONG TERM GOAL #1   Title Pt will be independent with advanced HEP for core stabilization to be able to come sit to stand without UE assist.    Time 4    Period Weeks    Status On-going      PT LONG TERM GOAL #2   Title Pt to be able to single leg stance on both Rt and Lt LE for at least 15 seconds to allow pt to state that he has not fallen in  the past week.    Time 4    Period Weeks    Status On-going      PT LONG TERM GOAL #3   Title  PT to be able to I get from floor to a chair for recovery strategies if pt falls and there is nobody home.    Time 4    Period Weeks    Status On-going                   Plan - 02/10/21 1107     Clinical Impression Statement Reviewed evaluatoin and goals with pt.  Began core strengthening and balance exercises.    Personal Factors and Comorbidities Age;Comorbidity 3+    Comorbidities vertigo, spinal stenosis, back surgery    Examination-Activity Limitations Carry;Lift;Transfers;Stairs;Squat    Examination-Participation Restrictions Cleaning;Yard Work    Stability/Clinical Decision Making Stable/Uncomplicated    Rehab Potential Good    PT Frequency 2x / week    PT Duration 4 weeks    PT Treatment/Interventions Balance training;Therapeutic exercise;Neuromuscular re-education;Stair training;Gait training    PT Next Visit Plan Progress balance, and core strengthening    PT Home Exercise Plan functional squat, bridge, Toe raise sitting; 8/29:  wall arch , tandem stance knee to chest, scapular and cervical retraction.             Patient will benefit from skilled therapeutic intervention in order to improve the following deficits and impairments:  Abnormal gait, Decreased balance, Decreased range of motion, Decreased strength, Pain, Postural dysfunction  Visit Diagnosis: Repeated falls  Chronic bilateral low back pain with right-sided sciatica  Muscle weakness (generalized)     Problem List Patient Active Problem List   Diagnosis Date Noted   Pain of both hip joints 01/15/2021   History of coronary artery bypass graft 01/15/2021   Asymptomatic carotid artery stenosis 01/15/2021   Bilateral pseudophakia 01/15/2021   Gastroesophageal reflux disease 01/15/2021   Osteopenia 01/15/2021   Right bundle branch block 01/15/2021   Skin cancer 01/15/2021   Spinal stenosis of  lumbar region 01/15/2021   Vitamin D deficiency 01/15/2021   Hypothyroidism 01/15/2021   Encounter to establish care 01/15/2021   Benign localized prostatic hyperplasia with lower urinary tract symptoms (LUTS) 03/07/2019   Protein-calorie malnutrition, severe 09/11/2017   Hyperlipidemia    Cerebrovascular disease    Thrombocytopenia (HCC)    Erectile dysfunction    CAD (coronary artery disease)    Hypertension 01/16/2009   Rayetta Humphrey, PT CLT [phone]  02/10/2021, 11:31 AM  [facility] [address] [place], [place], [address] Phone: [phone]   Fax:  [phone]  Name: Jorge Bishop MRN: [id number] Date of Birth: [birth year]

## 2021-02-12 ENCOUNTER — Other Ambulatory Visit: Payer: Self-pay

## 2021-02-12 ENCOUNTER — Ambulatory Visit (HOSPITAL_COMMUNITY): Payer: Medicare Other | Admitting: Physical Therapy

## 2021-02-12 DIAGNOSIS — G8929 Other chronic pain: Secondary | ICD-10-CM

## 2021-02-12 DIAGNOSIS — M6281 Muscle weakness (generalized): Secondary | ICD-10-CM

## 2021-02-12 DIAGNOSIS — R296 Repeated falls: Secondary | ICD-10-CM | POA: Diagnosis not present

## 2021-02-12 NOTE — Therapy (Signed)
[facility] [address] [place], [place], [address] Phone: [phone]   Fax:  [phone]  Physical Therapy Treatment  Patient Details  Name: Jorge Bishop MRN: [id number] Date of Birth: November 22, 1935 Referring Provider (PT): Ihor Dow   Encounter Date: 02/12/2021   PT End of Session - 02/12/21 1223     Visit Number 3    Number of Visits 8    Date for PT Re-Evaluation 03/07/21    Authorization Type Medicare    PT Start Time 1138    PT Stop Time 1220    PT Time Calculation (min) 42 min    Equipment Utilized During Treatment Gait belt    Activity Tolerance Patient tolerated treatment well    Behavior During Therapy [facility] for tasks assessed/performed             Past Medical History:  Diagnosis Date   Arteriosclerotic cardiovascular disease (ASCVD)    Coronary artery bypass graft surgery in 12/98; DES to the RCA SVG in 07/2002; restenosis of the saphenous vein graft stent and renal intervention in 2006; 70% first diagonal present; calf in 11/2007-total obstruction of the saphenous vein graft to the first marginal   Benign prostatic hypertrophy    Cerebrovascular disease    60% bilateral internal carotid artery stenosis in 2012   Coronary artery disease    4 stents   Diaphragmatic hernia 01/15/2021   Diverticulosis    DJD (degenerative joint disease)    Erectile dysfunction    GERD (gastroesophageal reflux disease)    History of blood transfusion    History of kidney stones    Hyperlipidemia    Lipid profile in 12/2009:104, 91, 36, 50   Hypertension    Kidney stone 09/08/2017   Migraine headache    Right bundle branch block    Right ureteral stone 03/08/2019   Shortness of breath    Thrombocytopenia (HCC)    platelets of 104 in 03/2009   Upper gastrointestinal hemorrhage 123456    Helicobacter pylori positive; subsequent laparoscopic fundoplication-2009    Past Surgical History:  Procedure Laterality Date   CARDIAC  CATHETERIZATION     4 cardiac stents   CATARACT EXTRACTION W/PHACO Right 07/25/2012   Procedure: CATARACT EXTRACTION PHACO AND INTRAOCULAR LENS PLACEMENT ([place]);  Surgeon: Tonny Branch, MD;  Location: [facility];  Service: Ophthalmology;  Laterality: Right;  CDE:23.31   CATARACT EXTRACTION W/PHACO Left 08/18/2012   Procedure: CATARACT EXTRACTION PHACO AND INTRAOCULAR LENS PLACEMENT ([facility]);  Surgeon: Tonny Branch, MD;  Location: [facility];  Service: Ophthalmology;  Laterality: Left;  CDE: 16.07   COLONOSCOPY N/A 08/01/2014   Procedure: COLONOSCOPY;  Surgeon: Rogene Houston, MD;  [facility];  Service: Endoscopy;  Laterality: N/A;  125 - moved to 10:30 - Ann to notify pt   CORONARY ARTERY BYPASS GRAFT  1998    CYSTOSCOPY/URETEROSCOPY/HOLMIUM LASER/STENT PLACEMENT Right 11/12/2017   Procedure: CYSTOSCOPY/RIGHT RETROGRADE PYELOGRAM/RIGHT URETEROSCOPY/HOLMIUM LASER LITHOTRIPSY RIGHT URETERAL CALCULUS/RIGHT URETERAL STENT PLACEMENT;  Surgeon: [name], MD;  Location: [facility];  Service: Urology;  Laterality: Right;  right ureteral calculus given to patient's wife per Dr. [name]   CYSTOSCOPY/URETEROSCOPY/HOLMIUM LASER/STENT PLACEMENT Right 03/07/2019   Procedure: CYSTOSCOPY RIGHT RETROGRADE RIGHT URETEROSCOPY;  Surgeon: [name], MD;  [facility];  Service: Urology;  Laterality: Right;   EXTRACORPOREAL SHOCK WAVE LITHOTRIPSY Right 09/13/2017   Procedure: RIGHT EXTRACORPOREAL SHOCK WAVE LITHOTRIPSY (ESWL);  Surgeon: Lucas Mallow, MD;  [facility];  Service:  Urology;  Laterality: Right;   HERNIA REPAIR     right inguinal   LAPAROSCOPIC NISSEN FUNDOPLICATION  0000000, [id number]    revision in 2009 along with repair of paraesophagel hernia    PARAESOPHAGEAL HERNIA REPAIR     TRANSURETHRAL RESECTION OF PROSTATE N/A 03/07/2019   Procedure: TRANSURETHRAL RESECTION OF THE PROSTATE (TURP);  Surgeon: [name], MD;  [facility];  Service: Urology;  Laterality: N/A;    There were no vitals filed for  this visit.   Subjective Assessment - 02/12/21 1141     Subjective pt states he is feeling "kinda sluggish" and his back is hurting today.  States he done most of his exercises at home.    Currently in Pain? Yes    Pain Score 5     Pain Location Back    Pain Orientation Mid    Pain Descriptors / Indicators Aching                               [facility] Adult PT Treatment/Exercise - 02/12/21 0001       Knee/Hip Exercises: Stretches   Other Knee/Hip Stretches knee to chest 30" x 2      Knee/Hip Exercises: Standing   Forward Lunges Both;10 reps    Forward Lunges Limitations onto 4" step no UE asisst working on stability    Functional Squat 15 reps    Functional Squat Limitations cues for posturing      Knee/Hip Exercises: Seated   Other Seated Knee/Hip Exercises scapular retraction; cervical retraction review 15 reps    Sit to Sand 10 reps;without UE support   21 inches from ground     Knee/Hip Exercises: Supine   Bridges 15 reps    Straight Leg Raises Both;10 reps    Straight Leg Raises Limitations with core stab    Other Supine Knee/Hip Exercises abdominal isometrics with proper breathing and 5" holds 10X                      PT Short Term Goals - 02/10/21 1059       PT SHORT TERM GOAL #1   Title Pt will be independent with HEP to improve core and LE strength    Time 2    Period Weeks    Status On-going    Target Date 02/19/21               PT Long Term Goals - 02/10/21 1100       PT LONG TERM GOAL #1   Title Pt will be independent with advanced HEP for core stabilization to be able to come sit to stand without UE assist.    Time 4    Period Weeks    Status On-going      PT LONG TERM GOAL #2   Title Pt to be able to single leg stance on both Rt and Lt LE for at least 15 seconds to allow pt to state that he has not fallen in the past week.    Time 4    Period Weeks    Status On-going      PT LONG TERM GOAL #3   Title PT  to be able to I get from floor to a chair for recovery strategies if pt falls and there is nobody home.    Time 4    Period Weeks    Status On-going  Plan - 02/12/21 1226     Clinical Impression Statement Pt with increased pain today stating it may be from bridging.  Pt demonstrated high lift with pain in end range.  Instructed to complete in  ROM and focus more on glute contraction and completing exercise slowly/controlled.    Began abdominal stabilization with instructions on proper breathing techniques.  Lunging also added working on stability and glute strength.  PT reported reduced pain at end of session today.    Personal Factors and Comorbidities Age;Comorbidity 3+    Comorbidities vertigo, spinal stenosis, back surgery    Examination-Activity Limitations Carry;Lift;Transfers;Stairs;Squat    Examination-Participation Restrictions Cleaning;Yard Work    Stability/Clinical Decision Making Stable/Uncomplicated    Rehab Potential Good    PT Frequency 2x / week    PT Duration 4 weeks    PT Treatment/Interventions Balance training;Therapeutic exercise;Neuromuscular re-education;Stair training;Gait training    PT Next Visit Plan Progress balance, and core strengthening    PT Home Exercise Plan functional squat, bridge, Toe raise sitting; 8/29:  wall arch , tandem stance knee to chest scapular and cervical retraction.             Patient will benefit from skilled therapeutic intervention in order to improve the following deficits and impairments:  Abnormal gait, Decreased balance, Decreased range of motion, Decreased strength, Pain, Postural dysfunction  Visit Diagnosis: Repeated falls  Chronic bilateral low back pain with right-sided sciatica  Muscle weakness (generalized)     Problem List Patient Active Problem List   Diagnosis Date Noted   Pain of both hip joints 01/15/2021   History of coronary artery bypass graft 01/15/2021   Asymptomatic  carotid artery stenosis 01/15/2021   Bilateral pseudophakia 01/15/2021   Gastroesophageal reflux disease 01/15/2021   Osteopenia 01/15/2021   Right bundle branch block 01/15/2021   Skin cancer 01/15/2021   Spinal stenosis of lumbar region 01/15/2021   Vitamin D deficiency 01/15/2021   Hypothyroidism 01/15/2021   Encounter to establish care 01/15/2021   Benign localized prostatic hyperplasia with lower urinary tract symptoms (LUTS) 03/07/2019   Protein-calorie malnutrition, severe 09/11/2017   Hyperlipidemia    Cerebrovascular disease    Thrombocytopenia (HCC)    Erectile dysfunction    CAD (coronary artery disease)    Hypertension 01/16/2009   Teena Irani, PTA/CLT [phone]  Teena Irani 02/12/2021, 12:27 PM  [facility] [address] [place], [place], [address] Phone: [phone]   Fax:  [phone]  Name: Jorge Bishop MRN: [id number] Date of Birth: 08/26/1935

## 2021-02-14 ENCOUNTER — Encounter (HOSPITAL_COMMUNITY): Payer: Medicare Other | Admitting: Physical Therapy

## 2021-02-16 ENCOUNTER — Ambulatory Visit (INDEPENDENT_AMBULATORY_CARE_PROVIDER_SITE_OTHER): Payer: Medicare Other | Admitting: *Deleted

## 2021-02-16 ENCOUNTER — Other Ambulatory Visit: Payer: Self-pay

## 2021-02-16 DIAGNOSIS — Z0000 Encounter for general adult medical examination without abnormal findings: Secondary | ICD-10-CM

## 2021-02-16 NOTE — Progress Notes (Signed)
Subjective:   Jorge Bishop is a 85 y.o. male who presents for Medicare Annual/Subsequent preventive examination. I connected with  Jorge Bishop on 02/16/21 by an audio  enabled telemedicine application and verified that I am speaking with the correct person using two identifiers.   I discussed the limitations, risks, security and privacy concerns of performing an evaluation and management service by telephone and the availability of in person appointments. I also discussed with the patient that there may be a patient responsible charge related to this service. The patient expressed understanding and verbally consented to this telephonic visit.  Review of Systems           Objective:    There were no vitals filed for this visit. There is no height or weight on file to calculate BMI.  Advanced Directives 02/05/2021 03/07/2019 03/07/2019 03/03/2019 11/10/2017 09/13/2017 09/08/2017  Does Patient Have a Medical Advance Directive? No Yes Yes Yes Yes Yes No  Type of Advance Directive - [place];Living will [place];Living will [place];Living will [place];Living will [place];Living will -  Does patient want to make changes to medical advance directive? - Yes (Inpatient - patient defers changing a medical advance directive at this time - Information given) No - Patient declined - - No - Patient declined -  Copy of [place] in Chart? - - No - copy requested - Yes No - copy requested -  Would patient like information on creating a medical advance directive? No - Patient declined - - - - No - Patient declined No - Patient declined  Pre-existing out of facility DNR order (yellow form or pink MOST form) - - - - - - -    Current Medications (verified) Outpatient Encounter Medications as of 02/16/2021  Medication Sig   aspirin 81 MG tablet Take 81 mg by mouth daily.     atorvastatin  (LIPITOR) 20 MG tablet Take 20 mg by mouth at bedtime.     carvedilol (COREG) 12.5 MG tablet Take 6.25 mg by mouth 2 (two) times daily with a meal.   Cholecalciferol (VITAMIN D3) 50 MCG (2000 UT) TABS Take 10,000 Units by mouth daily.    FIBER PO Take 2 capsules by mouth daily as needed (constipation).   fluorometholone (FML) 0.1 % ophthalmic suspension SMARTSIG:1 Drop(s) In Eye(s) 2-4 Times Daily PRN   nitroGLYCERIN (NITROSTAT) 0.4 MG SL tablet Place 1 tablet (0.4 mg total) under the tongue every 5 (five) minutes as needed for chest pain.   ondansetron (ZOFRAN) 4 MG tablet Take 4 mg by mouth every 8 (eight) hours as needed for nausea or vomiting.    pantoprazole (PROTONIX) 20 MG tablet Take 1 tablet (20 mg total) by mouth daily.   promethazine (PHENERGAN) 25 MG tablet Take 25 mg by mouth every 6 (six) hours as needed for nausea or vomiting.   SYNTHROID 50 MCG tablet Take 50 mcg by mouth daily.   traMADol (ULTRAM) 50 MG tablet Take 50 mg by mouth every 6 (six) hours as needed (pain).   No facility-administered encounter medications on file as of 02/16/2021.    Allergies (verified) Clarithromycin, Codeine, Erythromycin, and Sulfa antibiotics   History: Past Medical History:  Diagnosis Date   Arteriosclerotic cardiovascular disease (ASCVD)    Coronary artery bypass graft surgery in 12/98; DES to the RCA SVG in 07/2002; restenosis of the saphenous vein graft stent and renal intervention in  2006; 70% first diagonal present; calf in 11/2007-total obstruction of the saphenous vein graft to the first marginal   Benign prostatic hypertrophy    Cerebrovascular disease    60% bilateral internal carotid artery stenosis in 2012   Coronary artery disease    4 stents   Diaphragmatic hernia 01/15/2021   Diverticulosis    DJD (degenerative joint disease)    Erectile dysfunction    GERD (gastroesophageal reflux disease)    History of blood transfusion    History of kidney stones    Hyperlipidemia     Lipid profile in 12/2009:104, 91, 36, 50   Hypertension    Kidney stone 09/08/2017   Migraine headache    Right bundle branch block    Right ureteral stone 03/08/2019   Shortness of breath    Thrombocytopenia (HCC)    platelets of 104 in 03/2009   Upper gastrointestinal hemorrhage [id number]    Helicobacter pylori positive; subsequent laparoscopic fundoplication-2009   Past Surgical History:  Procedure Laterality Date   CARDIAC CATHETERIZATION     4 cardiac stents   CATARACT EXTRACTION W/PHACO Right 07/25/2012   Procedure: CATARACT EXTRACTION PHACO AND INTRAOCULAR LENS PLACEMENT ([place]);  Surgeon: Tonny Branch, MD;  Location: [facility];  Service: Ophthalmology;  Laterality: Right;  CDE:23.31   CATARACT EXTRACTION W/PHACO Left 08/18/2012   Procedure: CATARACT EXTRACTION PHACO AND INTRAOCULAR LENS PLACEMENT ([facility]);  Surgeon: Tonny Branch, MD;  Location: [facility];  Service: Ophthalmology;  Laterality: Left;  CDE: 16.07   COLONOSCOPY N/A 08/01/2014   Procedure: COLONOSCOPY;  Surgeon: Rogene Houston, MD;  Location: [facility];  Service: Endoscopy;  Laterality: N/A;  125 - moved to 10:30 - Ann to notify pt   CORONARY ARTERY BYPASS GRAFT  1998    CYSTOSCOPY/URETEROSCOPY/HOLMIUM LASER/STENT PLACEMENT Right 11/12/2017   Procedure: CYSTOSCOPY/RIGHT RETROGRADE PYELOGRAM/RIGHT URETEROSCOPY/HOLMIUM LASER LITHOTRIPSY RIGHT URETERAL CALCULUS/RIGHT URETERAL STENT PLACEMENT;  Surgeon: [name], MD;  Location: [facility];  Service: Urology;  Laterality: Right;  right ureteral calculus given to patient's wife per Dr. [name]   CYSTOSCOPY/URETEROSCOPY/HOLMIUM LASER/STENT PLACEMENT Right 03/07/2019   Procedure: CYSTOSCOPY RIGHT RETROGRADE RIGHT URETEROSCOPY;  Surgeon: [name], MD;  Location: [facility];  Service: Urology;  Laterality: Right;   EXTRACORPOREAL SHOCK WAVE LITHOTRIPSY Right 09/13/2017   Procedure: RIGHT EXTRACORPOREAL SHOCK WAVE LITHOTRIPSY (ESWL);  Surgeon: Lucas Mallow, MD;  Location: [facility];  Service: Urology;   Laterality: Right;   HERNIA REPAIR     right inguinal   LAPAROSCOPIC NISSEN FUNDOPLICATION  0000000, [id number]    revision in 2009 along with repair of paraesophagel hernia    PARAESOPHAGEAL HERNIA REPAIR     TRANSURETHRAL RESECTION OF PROSTATE N/A 03/07/2019   Procedure: TRANSURETHRAL RESECTION OF THE PROSTATE (TURP);  Surgeon: [name], MD;  Location: [facility];  Service: Urology;  Laterality: N/A;   Family History  Problem Relation Age of Onset   CAD Father    Social History   Socioeconomic History   Marital status: Married    Spouse name: Not on file   Number of children: Not on file   Years of education: Not on file   Highest education level: Not on file  Occupational History   Occupation: Full time    Employer: RETIRED  Tobacco Use   Smoking status: Never   Smokeless tobacco: Never  Vaping Use   Vaping Use: Never used  Substance and Sexual Activity   Alcohol use: No    Alcohol/week: 0.0 standard drinks   Drug  use: No   Sexual activity: Yes    Birth control/protection: None  Other Topics Concern   Not on file  Social History Narrative   Not on file   Social Determinants of Health   Financial Resource Strain: Not on file  Food Insecurity: Not on file  Transportation Needs: Not on file  Physical Activity: Not on file  Stress: Not on file  Social Connections: Not on file    Tobacco Counseling Counseling given: Not Answered   Clinical Intake:                 Diabetic?no         Activities of Daily Living In your present state of health, do you have any difficulty performing the following activities: 01/15/2021  Hearing? N  Vision? N  Difficulty concentrating or making decisions? N  Walking or climbing stairs? Y  Dressing or bathing? N  Doing errands, shopping? N  Some recent data might be hidden    Patient Care Team: Lindell Spar, MD as PCP - General (Internal Medicine)  Indicate any recent Medical Services you may have received from  other than [facility] providers in the past year (date may be approximate).     Assessment:   This is a routine wellness examination for [place].  Hearing/Vision screen No results found.  Dietary issues and exercise activities discussed:     Goals Addressed   None   Depression Screen PHQ 2/9 Scores 01/15/2021  PHQ - 2 Score 0    Fall Risk Fall Risk  01/15/2021  Falls in the past year? 1  Number falls in past yr: 1  Injury with Fall? 0  Risk for fall due to : History of fall(s);Impaired balance/gait  Follow up Falls evaluation completed;Education provided;Falls prevention discussed;Follow up appointment    FALL RISK PREVENTION PERTAINING TO THE HOME:  Any stairs in or around the home? Yes  If so, are there any without handrails? Yes  Home free of loose throw rugs in walkways, pet beds, electrical cords, etc? Yes  Adequate lighting in your home to reduce risk of falls? Yes   ASSISTIVE DEVICES UTILIZED TO PREVENT FALLS:  Life alert? No  Use of a cane, walker or w/c? Yes  Grab bars in the bathroom? Yes  Shower chair or bench in shower? No  Elevated toilet seat or a handicapped toilet? Yes   TIMED UP AND GO:  Was the test performed? No .  Length of time to ambulate 10 feet: NA sec.     Cognitive Function:        Immunizations Immunization History  Administered Date(s) Administered   Fluad Quad(high Dose 65+) 03/08/2019   Influenza Split 02/14/2016   Influenza, High Dose Seasonal PF 03/15/2014, 03/15/2018   Influenza-Unspecified 03/08/2019, 03/06/2020   Moderna SARS-COV2 Booster Vaccination 10/17/2020   Moderna Sars-Covid-2 Vaccination 07/10/2019, 08/08/2019, 04/17/2020   Pneumococcal Conjugate-13 10/18/2015   Pneumococcal Polysaccharide-23 02/18/2017   Td (Adult) 08/28/2014   Tdap 08/28/2014   Zoster Recombinat (Shingrix) 02/18/2017, 04/27/2017    TDAP status: Up to date  Flu Vaccine status: Due, Education has been provided regarding the importance of this  vaccine. Advised may receive this vaccine at local pharmacy or Health Dept. Aware to provide a copy of the vaccination record if obtained from local pharmacy or Health Dept. Verbalized acceptance and understanding.  Pneumococcal vaccine status: Up to date  Covid-19 vaccine status: Completed vaccines  Qualifies for Shingles Vaccine? Yes   Zostavax completed Yes  Shingrix Completed?: Yes  Screening Tests Health Maintenance  Topic Date Due   INFLUENZA VACCINE  01/13/2021   COVID-19 Vaccine (5 - Booster for Moderna series) 02/17/2021   TETANUS/TDAP  08/27/2024   PNA vac Low Risk Adult  Completed   Zoster Vaccines- Shingrix  Completed   HPV VACCINES  Aged Out   FOOT EXAM  Discontinued   HEMOGLOBIN A1C  Discontinued   OPHTHALMOLOGY EXAM  Discontinued   URINE MICROALBUMIN  Discontinued    Health Maintenance  Health Maintenance Due  Topic Date Due   INFLUENZA VACCINE  01/13/2021   COVID-19 Vaccine (5 - Booster for Moderna series) 02/17/2021    Colorectal cancer screening: No longer required.   Lung Cancer Screening: (Low Dose CT Chest recommended if Age 83-80 years, 30 pack-year currently smoking OR have quit w/in 15years.) does not qualify.   Lung Cancer Screening Referral: NA  Additional Screening:  Hepatitis C Screening: does not qualify; Completed   Vision Screening: Recommended annual ophthalmology exams for early detection of glaucoma and other disorders of the eye. Is the patient up to date with their annual eye exam?  Yes  Who is the provider or what is the name of the office in which the patient attends annual eye exams? Completed If pt is not established with a provider, would they like to be referred to a provider to establish care? No .   Dental Screening: Recommended annual dental exams for proper oral hygiene  Community Resource Referral / Chronic Care Management: CRR required this visit?  No   CCM required this visit?  No      Plan:     I have  personally reviewed and noted the following in the patient's chart:   Medical and social history Use of alcohol, tobacco or illicit drugs  Current medications and supplements including opioid prescriptions. Patient is not currently taking opioid prescriptions. Functional ability and status Nutritional status Physical activity Advanced directives List of other physicians Hospitalizations, surgeries, and ER visits in previous 12 months Vitals Screenings to include cognitive, depression, and falls Referrals and appointments  In addition, I have reviewed and discussed with patient certain preventive protocols, quality metrics, and best practice recommendations. A written personalized care plan for preventive services as well as general preventive health recommendations were provided to patient.     Shelda Altes, CMA   02/16/2021   Nurse Notes: This was a telehealth visit. The patient was at home. The Provider was at home and was Ihor Dow, MD.

## 2021-02-16 NOTE — Patient Instructions (Signed)
[name] , Thank you for taking time to come for your Medicare Wellness Visit. I appreciate your ongoing commitment to your health goals. Please review the following plan we discussed and let me know if I can assist you in the future.   Screening recommendations/referrals: Recommended yearly ophthalmology/optometry visit for glaucoma screening and checkup Recommended yearly dental visit for hygiene and checkup  Vaccinations: Influenza vaccine: Due now Pneumococcal vaccine: Complete Tdap vaccine: Complete Shingles vaccine: Complete    Advanced directives: Copy Requested  Conditions/risks identified: Hypertension  Next appointment: 1 year  Preventive Care 83 Years and Older, Male Preventive care refers to lifestyle choices and visits with your health care provider that can promote health and wellness. What does preventive care include? A yearly physical exam. This is also called an annual well check. Dental exams once or twice a year. Routine eye exams. Ask your health care provider how often you should have your eyes checked. Personal lifestyle choices, including: Daily care of your teeth and gums. Regular physical activity. Eating a healthy diet. Avoiding tobacco and drug use. Limiting alcohol use. Practicing safe sex. Taking low doses of aspirin every day. Taking vitamin and mineral supplements as recommended by your health care provider. What happens during an annual well check? The services and screenings done by your health care provider during your annual well check will depend on your age, overall health, lifestyle risk factors, and family history of disease. Counseling  Your health care provider may ask you questions about your: Alcohol use. Tobacco use. Drug use. Emotional well-being. Home and relationship well-being. Sexual activity. Eating habits. History of falls. Memory and ability to understand (cognition). Work and work Statistician. Screening  You may  have the following tests or measurements: Height, weight, and BMI. Blood pressure. Lipid and cholesterol levels. These may be checked every 5 years, or more frequently if you are over 83 years old. Skin check. Lung cancer screening. You may have this screening every year starting at age 836 if you have a 30-pack-year history of smoking and currently smoke or have quit within the past 15 years. Fecal occult blood test (FOBT) of the stool. You may have this test every year starting at age 83. Flexible sigmoidoscopy or colonoscopy. You may have a sigmoidoscopy every 5 years or a colonoscopy every 10 years starting at age 83. Prostate cancer screening. Recommendations will vary depending on your family history and other risks. Hepatitis C blood test. Hepatitis B blood test. Sexually transmitted disease (STD) testing. Diabetes screening. This is done by checking your blood sugar (glucose) after you have not eaten for a while (fasting). You may have this done every 1-3 years. Abdominal aortic aneurysm (AAA) screening. You may need this if you are a current or former smoker. Osteoporosis. You may be screened starting at age 83 if you are at high risk. Talk with your health care provider about your test results, treatment options, and if necessary, the need for more tests. Vaccines  Your health care provider may recommend certain vaccines, such as: Influenza vaccine. This is recommended every year. Tetanus, diphtheria, and acellular pertussis (Tdap, Td) vaccine. You may need a Td booster every 10 years. Zoster vaccine. You may need this after age 83. Pneumococcal 13-valent conjugate (PCV13) vaccine. One dose is recommended after age 83. Pneumococcal polysaccharide (PPSV23) vaccine. One dose is recommended after age 83. Talk to your health care provider about which screenings and vaccines you need and how often you need them. This information is not  intended to replace advice given to you by your  health care provider. Make sure you discuss any questions you have with your health care provider. Document Released: 06/28/2015 Document Revised: 02/19/2016 Document Reviewed: 04/02/2015 Elsevier Interactive Patient Education  2017 [place] Prevention in the Home Falls can cause injuries. They can happen to people of all ages. There are many things you can do to make your home safe and to help prevent falls. What can I do on the outside of my home? Regularly fix the edges of walkways and driveways and fix any cracks. Remove anything that might make you trip as you walk through a door, such as a raised step or threshold. Trim any bushes or trees on the path to your home. Use bright outdoor lighting. Clear any walking paths of anything that might make someone trip, such as rocks or tools. Regularly check to see if handrails are loose or broken. Make sure that both sides of any steps have handrails. Any raised decks and porches should have guardrails on the edges. Have any leaves, snow, or ice cleared regularly. Use sand or salt on walking paths during winter. Clean up any spills in your garage right away. This includes oil or grease spills. What can I do in the bathroom? Use night lights. Install grab bars by the toilet and in the tub and shower. Do not use towel bars as grab bars. Use non-skid mats or decals in the tub or shower. If you need to sit down in the shower, use a plastic, non-slip stool. Keep the floor dry. Clean up any water that spills on the floor as soon as it happens. Remove soap buildup in the tub or shower regularly. Attach bath mats securely with double-sided non-slip rug tape. Do not have throw rugs and other things on the floor that can make you trip. What can I do in the bedroom? Use night lights. Make sure that you have a light by your bed that is easy to reach. Do not use any sheets or blankets that are too big for your bed. They should not hang down  onto the floor. Have a firm chair that has side arms. You can use this for support while you get dressed. Do not have throw rugs and other things on the floor that can make you trip. What can I do in the kitchen? Clean up any spills right away. Avoid walking on wet floors. Keep items that you use a lot in easy-to-reach places. If you need to reach something above you, use a strong step stool that has a grab bar. Keep electrical cords out of the way. Do not use floor polish or wax that makes floors slippery. If you must use wax, use non-skid floor wax. Do not have throw rugs and other things on the floor that can make you trip. What can I do with my stairs? Do not leave any items on the stairs. Make sure that there are handrails on both sides of the stairs and use them. Fix handrails that are broken or loose. Make sure that handrails are as long as the stairways. Check any carpeting to make sure that it is firmly attached to the stairs. Fix any carpet that is loose or worn. Avoid having throw rugs at the top or bottom of the stairs. If you do have throw rugs, attach them to the floor with carpet tape. Make sure that you have a light switch at the top of the stairs  and the bottom of the stairs. If you do not have them, ask someone to add them for you. What else can I do to help prevent falls? Wear shoes that: Do not have high heels. Have rubber bottoms. Are comfortable and fit you well. Are closed at the toe. Do not wear sandals. If you use a stepladder: Make sure that it is fully opened. Do not climb a closed stepladder. Make sure that both sides of the stepladder are locked into place. Ask someone to hold it for you, if possible. Clearly mark and make sure that you can see: Any grab bars or handrails. First and last steps. Where the edge of each step is. Use tools that help you move around (mobility aids) if they are needed. These include: Canes. Walkers. Scooters. Crutches. Turn  on the lights when you go into a dark area. Replace any light bulbs as soon as they burn out. Set up your furniture so you have a clear path. Avoid moving your furniture around. If any of your floors are uneven, fix them. If there are any pets around you, be aware of where they are. Review your medicines with your doctor. Some medicines can make you feel dizzy. This can increase your chance of falling. Ask your doctor what other things that you can do to help prevent falls. This information is not intended to replace advice given to you by your health care provider. Make sure you discuss any questions you have with your health care provider. Document Released: 03/28/2009 Document Revised: 11/07/2015 Document Reviewed: 07/06/2014 Elsevier Interactive Patient Education  2017 [employer].

## 2021-02-19 ENCOUNTER — Other Ambulatory Visit: Payer: Self-pay

## 2021-02-19 ENCOUNTER — Ambulatory Visit (HOSPITAL_COMMUNITY): Payer: Medicare Other | Attending: Internal Medicine | Admitting: Physical Therapy

## 2021-02-19 DIAGNOSIS — R296 Repeated falls: Secondary | ICD-10-CM

## 2021-02-19 DIAGNOSIS — M6281 Muscle weakness (generalized): Secondary | ICD-10-CM | POA: Insufficient documentation

## 2021-02-19 DIAGNOSIS — M5441 Lumbago with sciatica, right side: Secondary | ICD-10-CM | POA: Diagnosis present

## 2021-02-19 DIAGNOSIS — G8929 Other chronic pain: Secondary | ICD-10-CM

## 2021-02-19 NOTE — Therapy (Signed)
[facility] [address] [place], [place], [address] Phone: [phone]   Fax:  [phone]  Physical Therapy Treatment  Patient Details  Name: SALEH BLOODSAW MRN: [id number] Date of Birth: 1935-12-18 Referring Provider (PT): Ihor Dow   Encounter Date: 02/19/2021   PT End of Session - 02/19/21 1703     Visit Number 4    Number of Visits 8    Date for PT Re-Evaluation 03/07/21    Authorization Type Medicare    PT Start Time 1618    PT Stop Time 1705    PT Time Calculation (min) 47 min    Equipment Utilized During Treatment Gait belt    Activity Tolerance Patient tolerated treatment well    Behavior During Therapy [facility] for tasks assessed/performed             Past Medical History:  Diagnosis Date   Arteriosclerotic cardiovascular disease (ASCVD)    Coronary artery bypass graft surgery in 12/98; DES to the RCA SVG in 07/2002; restenosis of the saphenous vein graft stent and renal intervention in 2006; 70% first diagonal present; calf in 11/2007-total obstruction of the saphenous vein graft to the first marginal   Benign prostatic hypertrophy    Cerebrovascular disease    60% bilateral internal carotid artery stenosis in 2012   Coronary artery disease    4 stents   Diaphragmatic hernia 01/15/2021   Diverticulosis    DJD (degenerative joint disease)    Erectile dysfunction    GERD (gastroesophageal reflux disease)    History of blood transfusion    History of kidney stones    Hyperlipidemia    Lipid profile in 12/2009:104, 91, 36, 50   Hypertension    Kidney stone 09/08/2017   Migraine headache    Right bundle branch block    Right ureteral stone 03/08/2019   Shortness of breath    Thrombocytopenia (HCC)    platelets of 104 in 03/2009   Upper gastrointestinal hemorrhage [id number]    Helicobacter pylori positive; subsequent laparoscopic fundoplication-2009    Past Surgical History:  Procedure Laterality Date   CARDIAC  CATHETERIZATION     4 cardiac stents   CATARACT EXTRACTION W/PHACO Right 07/25/2012   Procedure: CATARACT EXTRACTION PHACO AND INTRAOCULAR LENS PLACEMENT ([place]);  Surgeon: Tonny Branch, MD;  Location: [facility];  Service: Ophthalmology;  Laterality: Right;  CDE:23.31   CATARACT EXTRACTION W/PHACO Left 08/18/2012   Procedure: CATARACT EXTRACTION PHACO AND INTRAOCULAR LENS PLACEMENT ([facility]);  Surgeon: Tonny Branch, MD;  Location: [facility];  Service: Ophthalmology;  Laterality: Left;  CDE: 16.07   COLONOSCOPY N/A 08/01/2014   Procedure: COLONOSCOPY;  Surgeon: Rogene Houston, MD;  [facility];  Service: Endoscopy;  Laterality: N/A;  125 - moved to 10:30 - Ann to notify pt   CORONARY ARTERY BYPASS GRAFT  1998    CYSTOSCOPY/URETEROSCOPY/HOLMIUM LASER/STENT PLACEMENT Right 11/12/2017   Procedure: CYSTOSCOPY/RIGHT RETROGRADE PYELOGRAM/RIGHT URETEROSCOPY/HOLMIUM LASER LITHOTRIPSY RIGHT URETERAL CALCULUS/RIGHT URETERAL STENT PLACEMENT;  Surgeon: [name], MD;  Location: [facility];  Service: Urology;  Laterality: Right;  right ureteral calculus given to patient's wife per Dr. [name]   CYSTOSCOPY/URETEROSCOPY/HOLMIUM LASER/STENT PLACEMENT Right 03/07/2019   Procedure: CYSTOSCOPY RIGHT RETROGRADE RIGHT URETEROSCOPY;  Surgeon: [name], MD;  [facility]: [facility];  Service: Urology;  Laterality: Right;   EXTRACORPOREAL SHOCK WAVE LITHOTRIPSY Right 09/13/2017   Procedure: RIGHT EXTRACORPOREAL SHOCK WAVE LITHOTRIPSY (ESWL);  Surgeon: Lucas Mallow, MD;  Location: [facility];  Service:  Urology;  Laterality: Right;   HERNIA REPAIR     right inguinal   LAPAROSCOPIC NISSEN FUNDOPLICATION  0000000, [id number]    revision in 2009 along with repair of paraesophagel hernia    PARAESOPHAGEAL HERNIA REPAIR     TRANSURETHRAL RESECTION OF PROSTATE N/A 03/07/2019   Procedure: TRANSURETHRAL RESECTION OF THE PROSTATE (TURP);  Surgeon: [name], MD;  Location[facility];  Service: Urology;  Laterality: N/A;    There were no vitals filed for  this visit.   Subjective Assessment - 02/19/21 1623     Subjective Pt reports no pain but states his calves are sore.  STates his vertigo is flared up today.    Currently in Pain? No/denies    Pain Score 0-No pain                               [facility] Adult PT Treatment/Exercise - 02/19/21 0001       Knee/Hip Exercises: Standing   Forward Lunges Both;15 reps    Forward Lunges Limitations onto 4" step no UE asisst working on stability    Lateral Step Up Both;10 reps;Hand Hold: 1;Step Height: 4"    Forward Step Up Both;10 reps;Hand Hold: 1;Step Height: 4"    Functional Squat 15 reps    Functional Squat Limitations cues for posturing    Other Standing Knee Exercises GTB postural 3:  retractions, rows, extensions 10X each      Knee/Hip Exercises: Seated   Sit to Sand without UE support;5 reps   from standard chair                Balance Exercises - 02/19/21 0001       Balance Exercises: Standing   Tandem Stance Eyes open;5 reps    Other Standing Exercises vectors 5X5" with 1 HHA             Upper Extremity Functional Index Score :   /80     PT Short Term Goals - 02/10/21 1059       PT SHORT TERM GOAL #1   Title Pt will be independent with HEP to improve core and LE strength    Time 2    Period Weeks    Status On-going    Target Date 02/19/21               PT Long Term Goals - 02/10/21 1100       PT LONG TERM GOAL #1   Title Pt will be independent with advanced HEP for core stabilization to be able to come sit to stand without UE assist.    Time 4    Period Weeks    Status On-going      PT LONG TERM GOAL #2   Title Pt to be able to single leg stance on both Rt and Lt LE for at least 15 seconds to allow pt to state that he has not fallen in the past week.    Time 4    Period Weeks    Status On-going      PT LONG TERM GOAL #3   Title PT to be able to I get from floor to a chair for recovery strategies if pt falls and there  is nobody home.    Time 4    Period Weeks    Status On-going  Plan - 02/19/21 1703     Clinical Impression Statement Focused on LE strength and stability today.  Able to complete squats in bars without UE assist and progressed to standard height chair for sit to stands.  Pt with one LOB but able to self correct.  Began standing postural strenghtening to engage core and improve posture.  Began lateral step ups and vectors today wtih noted challenge due to quad weakness.  Pt required cues with all actvitieis to maintain upright posturing and for proper holds.    Personal Factors and Comorbidities Age;Comorbidity 3+    Comorbidities vertigo, spinal stenosis, back surgery    Examination-Activity Limitations Carry;Lift;Transfers;Stairs;Squat    Examination-Participation Restrictions Cleaning;Yard Work    Stability/Clinical Decision Making Stable/Uncomplicated    Rehab Potential Good    PT Frequency 2x / week    PT Duration 4 weeks    PT Treatment/Interventions Balance training;Therapeutic exercise;Neuromuscular re-education;Stair training;Gait training    PT Next Visit Plan Progress balance, and core strengthening.  Update HEP to include green theraband postural exercises.    PT Home Exercise Plan functional squat, bridge, Toe raise sitting; 8/29:  wall arch , tandem stance knee to chest scapular and cervical retraction.             Patient will benefit from skilled therapeutic intervention in order to improve the following deficits and impairments:  Abnormal gait, Decreased balance, Decreased range of motion, Decreased strength, Pain, Postural dysfunction  Visit Diagnosis: Repeated falls  Chronic bilateral low back pain with right-sided sciatica     Problem List Patient Active Problem List   Diagnosis Date Noted   Pain of both hip joints 01/15/2021   History of coronary artery bypass graft 01/15/2021   Asymptomatic carotid artery stenosis 01/15/2021    Bilateral pseudophakia 01/15/2021   Gastroesophageal reflux disease 01/15/2021   Osteopenia 01/15/2021   Right bundle branch block 01/15/2021   Skin cancer 01/15/2021   Spinal stenosis of lumbar region 01/15/2021   Vitamin D deficiency 01/15/2021   Hypothyroidism 01/15/2021   Encounter to establish care 01/15/2021   Benign localized prostatic hyperplasia with lower urinary tract symptoms (LUTS) 03/07/2019   Protein-calorie malnutrition, severe 09/11/2017   Hyperlipidemia    Cerebrovascular disease    Thrombocytopenia (HCC)    Erectile dysfunction    CAD (coronary artery disease)    Hypertension 01/16/2009   Teena Irani, PTA/CLT [phone]  Teena Irani, PTA 02/19/2021, 5:16 PM  [facility] [address] [place], [place], [address] Phone: [phone]   Fax:  [phone]  Name: RAEBURN TOKUDA MRN: [id number] Date of Birth: 1936/05/28

## 2021-02-20 ENCOUNTER — Ambulatory Visit (HOSPITAL_COMMUNITY): Payer: Medicare Other

## 2021-02-20 ENCOUNTER — Encounter (HOSPITAL_COMMUNITY): Payer: Self-pay

## 2021-02-20 DIAGNOSIS — R296 Repeated falls: Secondary | ICD-10-CM

## 2021-02-20 DIAGNOSIS — G8929 Other chronic pain: Secondary | ICD-10-CM

## 2021-02-20 DIAGNOSIS — M6281 Muscle weakness (generalized): Secondary | ICD-10-CM

## 2021-02-20 DIAGNOSIS — M5441 Lumbago with sciatica, right side: Secondary | ICD-10-CM

## 2021-02-20 NOTE — Therapy (Signed)
[facility] [address] [place], [place], [address] Phone: [phone]   Fax:  [phone]  Physical Therapy Treatment  Patient Details  Name: Jorge Bishop MRN: [id number] Date of Birth: [birth year] Referring Provider (PT): Ihor Dow   Encounter Date: 02/20/2021   PT End of Session - 02/20/21 1128     Visit Number 5    Number of Visits 8    Date for PT Re-Evaluation 03/07/21    Authorization Type Medicare    PT Start Time 1120    PT Stop Time 1202    PT Time Calculation (min) 42 min    Activity Tolerance Patient tolerated treatment well    Behavior During Therapy [facility] for tasks assessed/performed             Past Medical History:  Diagnosis Date   Arteriosclerotic cardiovascular disease (ASCVD)    Coronary artery bypass graft surgery in 12/98; DES to the RCA SVG in 07/2002; restenosis of the saphenous vein graft stent and renal intervention in 2006; 70% first diagonal present; calf in 11/2007-total obstruction of the saphenous vein graft to the first marginal   Benign prostatic hypertrophy    Cerebrovascular disease    60% bilateral internal carotid artery stenosis in 2012   Coronary artery disease    4 stents   Diaphragmatic hernia 01/15/2021   Diverticulosis    DJD (degenerative joint disease)    Erectile dysfunction    GERD (gastroesophageal reflux disease)    History of blood transfusion    History of kidney stones    Hyperlipidemia    Lipid profile in 12/2009:104, 91, 36, 50   Hypertension    Kidney stone 09/08/2017   Migraine headache    Right bundle branch block    Right ureteral stone 03/08/2019   Shortness of breath    Thrombocytopenia (HCC)    platelets of 104 in 03/2009   Upper gastrointestinal hemorrhage 123456    Helicobacter pylori positive; subsequent laparoscopic fundoplication-2009    Past Surgical History:  Procedure Laterality Date   CARDIAC CATHETERIZATION     4 cardiac stents   CATARACT  EXTRACTION W/PHACO Right 07/25/2012   Procedure: CATARACT EXTRACTION PHACO AND INTRAOCULAR LENS PLACEMENT ([place]);  Surgeon: Tonny Branch, MD;  Location: [facility];  Service: Ophthalmology;  Laterality: Right;  CDE:23.31   CATARACT EXTRACTION W/PHACO Left 08/18/2012   Procedure: CATARACT EXTRACTION PHACO AND INTRAOCULAR LENS PLACEMENT ([facility]);  Surgeon: Tonny Branch, MD;  Location: [facility];  Service: Ophthalmology;  Laterality: Left;  CDE: 16.07   COLONOSCOPY N/A 08/01/2014   Procedure: COLONOSCOPY;  Surgeon: Rogene Houston, MD;  [facility];  Service: Endoscopy;  Laterality: N/A;  125 - moved to 10:30 - Ann to notify pt   CORONARY ARTERY BYPASS GRAFT  1998    CYSTOSCOPY/URETEROSCOPY/HOLMIUM LASER/STENT PLACEMENT Right 11/12/2017   Procedure: CYSTOSCOPY/RIGHT RETROGRADE PYELOGRAM/RIGHT URETEROSCOPY/HOLMIUM LASER LITHOTRIPSY RIGHT URETERAL CALCULUS/RIGHT URETERAL STENT PLACEMENT;  Surgeon: [name], MD;  Location: [facility];  Service: Urology;  Laterality: Right;  right ureteral calculus given to patient's wife per Dr. [name]   CYSTOSCOPY/URETEROSCOPY/HOLMIUM LASER/STENT PLACEMENT Right 03/07/2019   Procedure: CYSTOSCOPY RIGHT RETROGRADE RIGHT URETEROSCOPY;  Surgeon: [name], MD;  [facility];  Service: Urology;  Laterality: Right;   EXTRACORPOREAL SHOCK WAVE LITHOTRIPSY Right 09/13/2017   Procedure: RIGHT EXTRACORPOREAL SHOCK WAVE LITHOTRIPSY (ESWL);  Surgeon: Lucas Mallow, MD;  [facility];  Service: Urology;  Laterality: Right;   HERNIA REPAIR  right inguinal   LAPAROSCOPIC NISSEN FUNDOPLICATION  0000000, [id number]    revision in 2009 along with repair of paraesophagel hernia    PARAESOPHAGEAL HERNIA REPAIR     TRANSURETHRAL RESECTION OF PROSTATE N/A 03/07/2019   Procedure: TRANSURETHRAL RESECTION OF THE PROSTATE (TURP);  Surgeon: [name], MD;  [facility];  Service: Urology;  Laterality: N/A;    There were no vitals filed for this visit.   Subjective Assessment - 02/20/21  1127     Subjective Pt stated he is feeling good today, no reports of pain just general soreness.    Patient Stated Goals To be able to get up from the floor.  To be able to go up and down steps, To be able to carry something and walk.    Currently in Pain? No/denies                               [facility] Adult PT Treatment/Exercise - 02/20/21 0001       Knee/Hip Exercises: Standing   Forward Lunges Both;10 reps    Forward Lunges Limitations on floor    Functional Squat 2 sets;10 reps    Functional Squat Limitations cueing for form and mechanics    SLS with Vectors 3x5" BLE 1 HHA    Other Standing Knee Exercises GTB postural 3:  retractions, rows, extensions 15X each      Knee/Hip Exercises: Seated   Sit to Sand 2 sets;5 reps;without UE support   standard height and elevated height     Knee/Hip Exercises: Prone   Other Prone Exercises Quadruped UE/LE 5x 5"; quadruped: hip flexion then extend    Other Prone Exercises tall kneeling x 2 min                 Balance Exercises - 02/20/21 0001       Balance Exercises: Standing   Tandem Stance Eyes open;3 reps;30 secs    SLS Eyes open;3 reps    SLS with Vectors 5 reps;Intermittent upper extremity assist   5" holds   Partial Tandem Stance Eyes open;5 reps   head turns   Sit to Stand Standard surface                  PT Short Term Goals - 02/10/21 1059       PT SHORT TERM GOAL #1   Title Pt will be independent with HEP to improve core and LE strength    Time 2    Period Weeks    Status On-going    Target Date 02/19/21               PT Long Term Goals - 02/10/21 1100       PT LONG TERM GOAL #1   Title Pt will be independent with advanced HEP for core stabilization to be able to come sit to stand without UE assist.    Time 4    Period Weeks    Status On-going      PT LONG TERM GOAL #2   Title Pt to be able to single leg stance on both Rt and Lt LE for at least 15 seconds to allow  pt to state that he has not fallen in the past week.    Time 4    Period Weeks    Status On-going      PT LONG TERM GOAL #3   Title PT to  be able to I get from floor to a chair for recovery strategies if pt falls and there is nobody home.    Time 4    Period Weeks    Status On-going                   Plan - 02/20/21 1255     Clinical Impression Statement Added quadruped transitioning to tall knee to address floor to standing personal goal.  Quadruped UE/LE challengening due to core and LE weakness though no reports of pain.  Slow transitions to prevent dizziness.  Added postural strengthening with theraband to HEP, pt able to demonstrate appropriate form.  Balance activities difficult, required intermittent HHA and cueing for core engangement and posture to assist.    Personal Factors and Comorbidities Age;Comorbidity 3+    Comorbidities vertigo, spinal stenosis, back surgery    Examination-Activity Limitations Carry;Lift;Transfers;Stairs;Squat    Examination-Participation Restrictions Cleaning;Yard Work    Stability/Clinical Decision Making Stable/Uncomplicated    Clinical Decision Making Moderate    Rehab Potential Good    PT Frequency 2x / week    PT Duration 4 weeks    PT Treatment/Interventions Balance training;Therapeutic exercise;Neuromuscular re-education;Stair training;Gait training    PT Next Visit Plan Progress balance, and core strengthening.  Work on transition floor to standing.    PT Home Exercise Plan functional squat, bridge, Toe raise sitting; 8/29:  wall arch , tandem stance knee to chest scapular and cervical retraction.; 02/20/21: GTB postural strengthening    Consulted and Agree with Plan of Care Patient             Patient will benefit from skilled therapeutic intervention in order to improve the following deficits and impairments:  Abnormal gait, Decreased balance, Decreased range of motion, Decreased strength, Pain, Postural dysfunction  Visit  Diagnosis: Repeated falls  Chronic bilateral low back pain with right-sided sciatica  Muscle weakness (generalized)     Problem List Patient Active Problem List   Diagnosis Date Noted   Pain of both hip joints 01/15/2021   History of coronary artery bypass graft 01/15/2021   Asymptomatic carotid artery stenosis 01/15/2021   Bilateral pseudophakia 01/15/2021   Gastroesophageal reflux disease 01/15/2021   Osteopenia 01/15/2021   Right bundle branch block 01/15/2021   Skin cancer 01/15/2021   Spinal stenosis of lumbar region 01/15/2021   Vitamin D deficiency 01/15/2021   Hypothyroidism 01/15/2021   Encounter to establish care 01/15/2021   Benign localized prostatic hyperplasia with lower urinary tract symptoms (LUTS) 03/07/2019   Protein-calorie malnutrition, severe 09/11/2017   Hyperlipidemia    Cerebrovascular disease    Thrombocytopenia (HCC)    Erectile dysfunction    CAD (coronary artery disease)    Hypertension 01/16/2009   Ihor Austin, LPTA/CLT; CBIS [phone]  Aldona Lento, PTA 02/20/2021, 1:20 PM  [facility] [address] [place], [place], [address] Phone: [phone]   Fax:  [phone]  Name: CLERANCE UZZLE MRN: [id number] Date of Birth: Oct 07, 1935

## 2021-02-24 ENCOUNTER — Encounter (HOSPITAL_COMMUNITY): Payer: Medicare Other

## 2021-02-25 ENCOUNTER — Encounter (HOSPITAL_COMMUNITY): Payer: Self-pay | Admitting: Physical Therapy

## 2021-02-25 ENCOUNTER — Ambulatory Visit (HOSPITAL_COMMUNITY): Payer: Medicare Other | Admitting: Physical Therapy

## 2021-02-25 ENCOUNTER — Other Ambulatory Visit: Payer: Self-pay

## 2021-02-25 DIAGNOSIS — G8929 Other chronic pain: Secondary | ICD-10-CM

## 2021-02-25 DIAGNOSIS — M6281 Muscle weakness (generalized): Secondary | ICD-10-CM

## 2021-02-25 DIAGNOSIS — R296 Repeated falls: Secondary | ICD-10-CM | POA: Diagnosis not present

## 2021-02-25 DIAGNOSIS — M5441 Lumbago with sciatica, right side: Secondary | ICD-10-CM

## 2021-02-25 NOTE — Therapy (Signed)
[facility] [address] [place], [place], [address] Phone: [phone]   Fax:  [phone]  Physical Therapy Treatment  Patient Details  Name: Jorge Bishop MRN: [id number] Date of Birth: August 24, 1935 Referring Provider (PT): Ihor Dow   Encounter Date: 02/25/2021   PT End of Session - 02/25/21 1049     Visit Number 6    Number of Visits 8    Date for PT Re-Evaluation 03/07/21    Authorization Type Medicare    PT Start Time 1052   patient waiting outside and delayed start   PT Stop Time 1125    PT Time Calculation (min) 33 min    Activity Tolerance Patient tolerated treatment well    Behavior During Therapy [facility] for tasks assessed/performed             Past Medical History:  Diagnosis Date   Arteriosclerotic cardiovascular disease (ASCVD)    Coronary artery bypass graft surgery in 12/98; DES to the RCA SVG in 07/2002; restenosis of the saphenous vein graft stent and renal intervention in 2006; 70% first diagonal present; calf in 11/2007-total obstruction of the saphenous vein graft to the first marginal   Benign prostatic hypertrophy    Cerebrovascular disease    60% bilateral internal carotid artery stenosis in 2012   Coronary artery disease    4 stents   Diaphragmatic hernia 01/15/2021   Diverticulosis    DJD (degenerative joint disease)    Erectile dysfunction    GERD (gastroesophageal reflux disease)    History of blood transfusion    History of kidney stones    Hyperlipidemia    Lipid profile in 12/2009:104, 91, 36, 50   Hypertension    Kidney stone 09/08/2017   Migraine headache    Right bundle branch block    Right ureteral stone 03/08/2019   Shortness of breath    Thrombocytopenia (HCC)    platelets of 104 in 03/2009   Upper gastrointestinal hemorrhage [id number]    Helicobacter pylori positive; subsequent laparoscopic fundoplication-2009    Past Surgical History:  Procedure Laterality Date   CARDIAC  CATHETERIZATION     4 cardiac stents   CATARACT EXTRACTION W/PHACO Right 07/25/2012   Procedure: CATARACT EXTRACTION PHACO AND INTRAOCULAR LENS PLACEMENT ([place]);  Surgeon: Tonny Branch, MD;  Location: [facility];  Service: Ophthalmology;  Laterality: Right;  CDE:23.31   CATARACT EXTRACTION W/PHACO Left 08/18/2012   Procedure: CATARACT EXTRACTION PHACO AND INTRAOCULAR LENS PLACEMENT ([facility]);  Surgeon: Tonny Branch, MD;  Location: [facility];  Service: Ophthalmology;  Laterality: Left;  CDE: 16.07   COLONOSCOPY N/A 08/01/2014   Procedure: COLONOSCOPY;  Surgeon: Rogene Houston, MD;  [facility];  Service: Endoscopy;  Laterality: N/A;  125 - moved to 10:30 - Ann to notify pt   CORONARY ARTERY BYPASS GRAFT  1998    CYSTOSCOPY/URETEROSCOPY/HOLMIUM LASER/STENT PLACEMENT Right 11/12/2017   Procedure: CYSTOSCOPY/RIGHT RETROGRADE PYELOGRAM/RIGHT URETEROSCOPY/HOLMIUM LASER LITHOTRIPSY RIGHT URETERAL CALCULUS/RIGHT URETERAL STENT PLACEMENT;  Surgeon: [name], MD;  Location: [facility];  Service: Urology;  Laterality: Right;  right ureteral calculus given to patient's wife per Dr. [name]   CYSTOSCOPY/URETEROSCOPY/HOLMIUM LASER/STENT PLACEMENT Right 03/07/2019   Procedure: CYSTOSCOPY RIGHT RETROGRADE RIGHT URETEROSCOPY;  Surgeon: [name], MD;  Location: [facility];  Service: Urology;  Laterality: Right;   EXTRACORPOREAL SHOCK WAVE LITHOTRIPSY Right 09/13/2017   Procedure: RIGHT EXTRACORPOREAL SHOCK WAVE LITHOTRIPSY (ESWL);  Surgeon: Lucas Mallow, MD;  Location: [facility];  Service: Urology;  Laterality: Right;   HERNIA REPAIR     right inguinal   LAPAROSCOPIC NISSEN FUNDOPLICATION  0000000, [id number]    revision in 2009 along with repair of paraesophagel hernia    PARAESOPHAGEAL HERNIA REPAIR     TRANSURETHRAL RESECTION OF PROSTATE N/A 03/07/2019   Procedure: TRANSURETHRAL RESECTION OF THE PROSTATE (TURP);  Surgeon: [name], MD;  Location: [facility];  Service: Urology;  Laterality: N/A;    There were no vitals filed for  this visit.   Subjective Assessment - 02/25/21 1052     Subjective States that he had a long weekend going up and down a ladder. States he felt weak but had no falls.    Patient Stated Goals To be able to get up from the floor.  To be able to go up and down steps, To be able to carry something and walk.    Currently in Pain? No/denies                [facility] PT Assessment - 02/25/21 0001       Assessment   Medical Diagnosis Spinal stenosis    Referring Provider (PT) Ihor Dow                           [facility] Adult PT Treatment/Exercise - 02/25/21 0001       Knee/Hip Exercises: Seated   Other Seated Knee/Hip Exercises lumbar flexion and extension in seated 2 x10 5" holds, SIDE FLEXION 3x5 5" holds B    Sit to Sand 3 sets;10 reps   from regular surface - cues to reach forward     Knee/Hip Exercises: Prone   Other Prone Exercises quadruped alt arm taps x10 B                       PT Short Term Goals - 02/10/21 1059       PT SHORT TERM GOAL #1   Title Pt will be independent with HEP to improve core and LE strength    Time 2    Period Weeks    Status On-going    Target Date 02/19/21               PT Long Term Goals - 02/10/21 1100       PT LONG TERM GOAL #1   Title Pt will be independent with advanced HEP for core stabilization to be able to come sit to stand without UE assist.    Time 4    Period Weeks    Status On-going      PT LONG TERM GOAL #2   Title Pt to be able to single leg stance on both Rt and Lt LE for at least 15 seconds to allow pt to state that he has not fallen in the past week.    Time 4    Period Weeks    Status On-going      PT LONG TERM GOAL #3   Title PT to be able to I get from floor to a chair for recovery strategies if pt falls and there is nobody home.    Time 4    Period Weeks    Status On-going                   Plan - 02/25/21 1052     Clinical Impression Statement Soreness noted  in lumbar spine, added lumbar stretches which were tolerated  well. Added these to HEP. Patient able to stand up from seated position from regular chair height with cues to reach forward and breathing out on the way up. On third set patient's very fatigue and needed min assist to stand up. Will continue with current POC as tolerated.    Personal Factors and Comorbidities Age;Comorbidity 3+    Comorbidities vertigo, spinal stenosis, back surgery    Examination-Activity Limitations Carry;Lift;Transfers;Stairs;Squat    Examination-Participation Restrictions Cleaning;Yard Work    Stability/Clinical Decision Making Stable/Uncomplicated    Rehab Potential Good    PT Frequency 2x / week    PT Duration 4 weeks    PT Treatment/Interventions Balance training;Therapeutic exercise;Neuromuscular re-education;Stair training;Gait training    PT Next Visit Plan Progress balance, and core strengthening.  Work on transition floor to standing.    PT Home Exercise Plan functional squat, bridge, Toe raise sitting; 8/29:  wall arch , tandem stance knee to chest scapular and cervical retraction.; 02/20/21: GTB postural strengthening; 9/13 lumbar felxion, side bending, STS with reaching, and extension    Consulted and Agree with Plan of Care Patient             Patient will benefit from skilled therapeutic intervention in order to improve the following deficits and impairments:  Abnormal gait, Decreased balance, Decreased range of motion, Decreased strength, Pain, Postural dysfunction  Visit Diagnosis: Repeated falls  Chronic bilateral low back pain with right-sided sciatica  Muscle weakness (generalized)     Problem List Patient Active Problem List   Diagnosis Date Noted   Pain of both hip joints 01/15/2021   History of coronary artery bypass graft 01/15/2021   Asymptomatic carotid artery stenosis 01/15/2021   Bilateral pseudophakia 01/15/2021   Gastroesophageal reflux disease 01/15/2021   Osteopenia  01/15/2021   Right bundle branch block 01/15/2021   Skin cancer 01/15/2021   Spinal stenosis of lumbar region 01/15/2021   Vitamin D deficiency 01/15/2021   Hypothyroidism 01/15/2021   Encounter to establish care 01/15/2021   Benign localized prostatic hyperplasia with lower urinary tract symptoms (LUTS) 03/07/2019   Protein-calorie malnutrition, severe 09/11/2017   Hyperlipidemia    Cerebrovascular disease    Thrombocytopenia (HCC)    Erectile dysfunction    CAD (coronary artery disease)    Hypertension 01/16/2009    11:25 AM, 02/25/21 Jerene Pitch, DPT Physical Therapy with [facility]  [phone] office   [facility] [address] [place], [place], [address] Phone: [phone]   Fax:  [phone]  Name: Jorge Bishop MRN: [id number] Date of Birth: 1936-01-19

## 2021-02-26 ENCOUNTER — Telehealth: Payer: Self-pay | Admitting: Internal Medicine

## 2021-02-26 NOTE — Telephone Encounter (Signed)
Opened in Error.

## 2021-02-27 ENCOUNTER — Ambulatory Visit (HOSPITAL_COMMUNITY): Payer: Medicare Other

## 2021-02-27 ENCOUNTER — Encounter (HOSPITAL_COMMUNITY): Payer: Self-pay

## 2021-02-27 ENCOUNTER — Ambulatory Visit (INDEPENDENT_AMBULATORY_CARE_PROVIDER_SITE_OTHER): Payer: Medicare Other

## 2021-02-27 ENCOUNTER — Other Ambulatory Visit: Payer: Self-pay

## 2021-02-27 DIAGNOSIS — R296 Repeated falls: Secondary | ICD-10-CM

## 2021-02-27 DIAGNOSIS — G8929 Other chronic pain: Secondary | ICD-10-CM

## 2021-02-27 DIAGNOSIS — Z23 Encounter for immunization: Secondary | ICD-10-CM

## 2021-02-27 DIAGNOSIS — M6281 Muscle weakness (generalized): Secondary | ICD-10-CM

## 2021-02-27 NOTE — Therapy (Signed)
[facility] [address] [place], [place], [address] Phone: [phone]   Fax:  [phone]  Physical Therapy Treatment  Patient Details  Name: Jorge Bishop MRN: [id number] Date of Birth: Nov 05, 1935 Referring Provider (PT): Ihor Dow   Encounter Date: 02/27/2021   PT End of Session - 02/27/21 1501     Visit Number 7    Number of Visits 8    Date for PT Re-Evaluation 03/07/21    Authorization Type Medicare    PT Start Time 1450    PT Stop Time [id number]    PT Time Calculation (min) 40 min    Equipment Utilized During Treatment --    Activity Tolerance Patient tolerated treatment well;Patient limited by fatigue    Behavior During Therapy [facility] for tasks assessed/performed             Past Medical History:  Diagnosis Date   Arteriosclerotic cardiovascular disease (ASCVD)    Coronary artery bypass graft surgery in 12/98; DES to the RCA SVG in 07/2002; restenosis of the saphenous vein graft stent and renal intervention in 2006; 70% first diagonal present; calf in 11/2007-total obstruction of the saphenous vein graft to the first marginal   Benign prostatic hypertrophy    Cerebrovascular disease    60% bilateral internal carotid artery stenosis in 2012   Coronary artery disease    4 stents   Diaphragmatic hernia 01/15/2021   Diverticulosis    DJD (degenerative joint disease)    Erectile dysfunction    GERD (gastroesophageal reflux disease)    History of blood transfusion    History of kidney stones    Hyperlipidemia    Lipid profile in 12/2009:104, 91, 36, 50   Hypertension    Kidney stone 09/08/2017   Migraine headache    Right bundle branch block    Right ureteral stone 03/08/2019   Shortness of breath    Thrombocytopenia (HCC)    platelets of 104 in 03/2009   Upper gastrointestinal hemorrhage [id number]    Helicobacter pylori positive; subsequent laparoscopic fundoplication-2009    Past Surgical History:  Procedure Laterality  Date   CARDIAC CATHETERIZATION     4 cardiac stents   CATARACT EXTRACTION W/PHACO Right 07/25/2012   Procedure: CATARACT EXTRACTION PHACO AND INTRAOCULAR LENS PLACEMENT ([place]);  Surgeon: Tonny Branch, MD;  Location: [facility];  Service: Ophthalmology;  Laterality: Right;  CDE:23.31   CATARACT EXTRACTION W/PHACO Left 08/18/2012   Procedure: CATARACT EXTRACTION PHACO AND INTRAOCULAR LENS PLACEMENT ([facility]);  Surgeon: Tonny Branch, MD;  Location: [facility];  Service: Ophthalmology;  Laterality: Left;  CDE: 16.07   COLONOSCOPY N/A 08/01/2014   Procedure: COLONOSCOPY;  Surgeon: Rogene Houston, MD;  [facility];  Service: Endoscopy;  Laterality: N/A;  125 - moved to 10:30 - Ann to notify pt   CORONARY ARTERY BYPASS GRAFT  1998    CYSTOSCOPY/URETEROSCOPY/HOLMIUM LASER/STENT PLACEMENT Right 11/12/2017   Procedure: CYSTOSCOPY/RIGHT RETROGRADE PYELOGRAM/RIGHT URETEROSCOPY/HOLMIUM LASER LITHOTRIPSY RIGHT URETERAL CALCULUS/RIGHT URETERAL STENT PLACEMENT;  Surgeon: [name], MD;  Location: [facility];  Service: Urology;  Laterality: Right;  right ureteral calculus given to patient's wife per Dr. [name]   CYSTOSCOPY/URETEROSCOPY/HOLMIUM LASER/STENT PLACEMENT Right 03/07/2019   Procedure: CYSTOSCOPY RIGHT RETROGRADE RIGHT URETEROSCOPY;  Surgeon: [name], MD;  [facility];  Service: Urology;  Laterality: Right;   EXTRACORPOREAL SHOCK WAVE LITHOTRIPSY Right 09/13/2017   Procedure: RIGHT EXTRACORPOREAL SHOCK WAVE LITHOTRIPSY (ESWL);  Surgeon: Lucas Mallow, MD;  [facility];  Service: Urology;  Laterality: Right;   HERNIA REPAIR     right inguinal   LAPAROSCOPIC NISSEN FUNDOPLICATION  0000000, [id number]    revision in 2009 along with repair of paraesophagel hernia    PARAESOPHAGEAL HERNIA REPAIR     TRANSURETHRAL RESECTION OF PROSTATE N/A 03/07/2019   Procedure: TRANSURETHRAL RESECTION OF THE PROSTATE (TURP);  Surgeon: [name], MD;  [facility];  Service: Urology;  Laterality: N/A;    There were no  vitals filed for this visit.   Subjective Assessment - 02/27/21 1458     Subjective Reports he was on a ladder cutting bushes for an hour and a half yesterday, sore following but was successful.  He trimmed while sidestepping driveway for [id number]    Pertinent History CABG, back surgery, TURP    Patient Stated Goals To be able to get up from the floor.  To be able to go up and down steps, To be able to carry something and walk.    Currently in Pain? No/denies                               [facility] Adult PT Treatment/Exercise - 02/27/21 0001       Transfers   Transfers Floor to Transfer    Floor to Transfer 5: Supervision    Floor to Transfer Details Verbal cues for technique    Number of Reps --   5 reps leading with each foot   Transfer Cueing increased difficulty leading with Rt LE vs Lt LE, required HHA to assist floor to standing      Knee/Hip Exercises: Standing   Functional Squat 2 sets;10 reps    Functional Squat Limitations 2nd set NBOS      Knee/Hip Exercises: Seated   Sit to Sand without UE support;10 reps;2 sets   difficult from standard height, cueing for forward lean     Knee/Hip Exercises: Prone   Other Prone Exercises quadruped UE/LE 2 set 5 reps 5" holds, increased difficulty with Rt LE    Other Prone Exercises tall kneeling throw/catch orange ball                       PT Short Term Goals - 02/10/21 1059       PT SHORT TERM GOAL #1   Title Pt will be independent with HEP to improve core and LE strength    Time 2    Period Weeks    Status On-going    Target Date 02/19/21               PT Long Term Goals - 02/10/21 1100       PT LONG TERM GOAL #1   Title Pt will be independent with advanced HEP for core stabilization to be able to come sit to stand without UE assist.    Time 4    Period Weeks    Status On-going      PT LONG TERM GOAL #2   Title Pt to be able to single leg stance on both Rt and Lt LE for at least 15  seconds to allow pt to state that he has not fallen in the past week.    Time 4    Period Weeks    Status On-going      PT LONG TERM GOAL #3   Title PT to be able to I get from floor to  a chair for recovery strategies if pt falls and there is nobody home.    Time 4    Period Weeks    Status On-going                   Plan - 02/27/21 1541     Clinical Impression Statement Continued functional strengthening to assist with floor to standing, pt able to complete though required HHA and increased difficulty noted wiht Rt LE today.  Pt limited by fatigue following transfer training and increased difficulty with standard height sit to stand, increased ease following cueing for forward reaching.  No reports of pain through session, was limited by soreness.    Personal Factors and Comorbidities Age;Comorbidity 3+    Comorbidities vertigo, spinal stenosis, back surgery    Examination-Activity Limitations Carry;Lift;Transfers;Stairs;Squat    Examination-Participation Restrictions Cleaning;Yard Work    Stability/Clinical Decision Making Stable/Uncomplicated    Clinical Decision Making Moderate    Rehab Potential Good    PT Frequency 2x / week    PT Duration 4 weeks    PT Treatment/Interventions Balance training;Therapeutic exercise;Neuromuscular re-education;Stair training;Gait training    PT Next Visit Plan Reassessment next session.  Progress balance, and core strengthening.  Work on transition floor to standing.    PT Home Exercise Plan functional squat, bridge, Toe raise sitting; 8/29:  wall arch , tandem stance knee to chest scapular and cervical retraction.; 02/20/21: GTB postural strengthening; 9/13 lumbar felxion, side bending, STS with reaching, and extension    Consulted and Agree with Plan of Care Patient             Patient will benefit from skilled therapeutic intervention in order to improve the following deficits and impairments:  Abnormal gait, Decreased balance,  Decreased range of motion, Decreased strength, Pain, Postural dysfunction  Visit Diagnosis: Repeated falls  Chronic bilateral low back pain with right-sided sciatica  Muscle weakness (generalized)     Problem List Patient Active Problem List   Diagnosis Date Noted   Pain of both hip joints 01/15/2021   History of coronary artery bypass graft 01/15/2021   Asymptomatic carotid artery stenosis 01/15/2021   Bilateral pseudophakia 01/15/2021   Gastroesophageal reflux disease 01/15/2021   Osteopenia 01/15/2021   Right bundle branch block 01/15/2021   Skin cancer 01/15/2021   Spinal stenosis of lumbar region 01/15/2021   Vitamin D deficiency 01/15/2021   Hypothyroidism 01/15/2021   Encounter to establish care 01/15/2021   Benign localized prostatic hyperplasia with lower urinary tract symptoms (LUTS) 03/07/2019   Protein-calorie malnutrition, severe 09/11/2017   Hyperlipidemia    Cerebrovascular disease    Thrombocytopenia (HCC)    Erectile dysfunction    CAD (coronary artery disease)    Hypertension 01/16/2009   Ihor Austin, LPTA/CLT; CBIS [phone]  Aldona Lento, PTA 02/27/2021, 3:45 PM  [facility] [address] [place], [place], [address] Phone: [phone]   Fax:  [phone]  Name: Jorge Bishop MRN: [id number] Date of Birth: Jan 14, 1936

## 2021-03-03 ENCOUNTER — Other Ambulatory Visit: Payer: Self-pay

## 2021-03-03 ENCOUNTER — Ambulatory Visit (HOSPITAL_COMMUNITY): Payer: Medicare Other | Admitting: Physical Therapy

## 2021-03-03 ENCOUNTER — Encounter (HOSPITAL_COMMUNITY): Payer: Self-pay | Admitting: Physical Therapy

## 2021-03-03 DIAGNOSIS — R296 Repeated falls: Secondary | ICD-10-CM | POA: Diagnosis not present

## 2021-03-03 DIAGNOSIS — G8929 Other chronic pain: Secondary | ICD-10-CM

## 2021-03-03 DIAGNOSIS — M5441 Lumbago with sciatica, right side: Secondary | ICD-10-CM

## 2021-03-03 DIAGNOSIS — M6281 Muscle weakness (generalized): Secondary | ICD-10-CM

## 2021-03-03 NOTE — Therapy (Signed)
[facility] [address] [place], [place], [address] Phone: [phone]   Fax:  [phone]  Physical Therapy Treatment and Progress Note and RECERT  Patient Details  Name: Jorge Bishop MRN: [id number] Date of Birth: 18-Oct-1935 Referring Provider (PT): Ihor Dow   Progress Note Reporting Period 02/05/21 to 03/03/21  See note below for Objective Data and Assessment of Progress/Goals.      Encounter Date: 03/03/2021   PT End of Session - 03/03/21 1359     Visit Number 8    Number of Visits 16    Date for PT Re-Evaluation 03/31/21    Authorization Type Medicare    PT Start Time 1400    PT Stop Time 1440    PT Time Calculation (min) 40 min    Activity Tolerance Patient tolerated treatment well;Patient limited by fatigue    Behavior During Therapy [facility] for tasks assessed/performed             Past Medical History:  Diagnosis Date   Arteriosclerotic cardiovascular disease (ASCVD)    Coronary artery bypass graft surgery in 12/98; DES to the RCA SVG in 07/2002; restenosis of the saphenous vein graft stent and renal intervention in 2006; 70% first diagonal present; calf in 11/2007-total obstruction of the saphenous vein graft to the first marginal   Benign prostatic hypertrophy    Cerebrovascular disease    60% bilateral internal carotid artery stenosis in 2012   Coronary artery disease    4 stents   Diaphragmatic hernia 01/15/2021   Diverticulosis    DJD (degenerative joint disease)    Erectile dysfunction    GERD (gastroesophageal reflux disease)    History of blood transfusion    History of kidney stones    Hyperlipidemia    Lipid profile in 12/2009:104, 91, 36, 50   Hypertension    Kidney stone 09/08/2017   Migraine headache    Right bundle branch block    Right ureteral stone 03/08/2019   Shortness of breath    Thrombocytopenia (HCC)    platelets of 104 in 03/2009   Upper gastrointestinal hemorrhage 58/09     Helicobacter pylori positive; subsequent laparoscopic fundoplication-2009    Past Surgical History:  Procedure Laterality Date   CARDIAC CATHETERIZATION     4 cardiac stents   CATARACT EXTRACTION W/PHACO Right 07/25/2012   Procedure: CATARACT EXTRACTION PHACO AND INTRAOCULAR LENS PLACEMENT ([place]);  Surgeon: Tonny Branch, MD;  [facility]: [facility];  Service: Ophthalmology;  Laterality: Right;  CDE:23.31   CATARACT EXTRACTION W/PHACO Left 08/18/2012   Procedure: CATARACT EXTRACTION PHACO AND INTRAOCULAR LENS PLACEMENT ([facility]);  Surgeon: Tonny Branch, MD;  Location: [facility];  Service: Ophthalmology;  Laterality: Left;  CDE: 16.07   COLONOSCOPY N/A 08/01/2014   Procedure: COLONOSCOPY;  Surgeon: Rogene Houston, MD;  [facility];  Service: Endoscopy;  Laterality: N/A;  125 - moved to 10:30 - Ann to notify pt   CORONARY ARTERY BYPASS GRAFT  1998    CYSTOSCOPY/URETEROSCOPY/HOLMIUM LASER/STENT PLACEMENT Right 11/12/2017   Procedure: CYSTOSCOPY/RIGHT RETROGRADE PYELOGRAM/RIGHT URETEROSCOPY/HOLMIUM LASER LITHOTRIPSY RIGHT URETERAL CALCULUS/RIGHT URETERAL STENT PLACEMENT;  Surgeon: [name], MD;  Location: [facility];  Service: Urology;  Laterality: Right;  right ureteral calculus given to patient's wife per Dr. [name]   CYSTOSCOPY/URETEROSCOPY/HOLMIUM LASER/STENT PLACEMENT Right 03/07/2019   Procedure: CYSTOSCOPY RIGHT RETROGRADE RIGHT URETEROSCOPY;  Surgeon: [name], MD;  [facility];  Service: Urology;  Laterality: Right;   EXTRACORPOREAL SHOCK WAVE LITHOTRIPSY Right 09/13/2017  Procedure: RIGHT EXTRACORPOREAL SHOCK WAVE LITHOTRIPSY (ESWL);  Surgeon: Lucas Mallow, MD;  [facility];  Service: Urology;  Laterality: Right;   HERNIA REPAIR     right inguinal   LAPAROSCOPIC NISSEN FUNDOPLICATION  5361, 4431    revision in 2009 along with repair of paraesophagel hernia    PARAESOPHAGEAL HERNIA REPAIR     TRANSURETHRAL RESECTION OF PROSTATE N/A 03/07/2019   Procedure: TRANSURETHRAL RESECTION OF  THE PROSTATE (TURP);  Surgeon: [name], MD;  [facility];  Service: Urology;  Laterality: N/A;    There were no vitals filed for this visit.   Subjective Assessment - 03/03/21 1402     Subjective States he does his exercises daily but gets through about half of his exercise each day.    Pertinent History CABG, back surgery, TURP    Patient Stated Goals To be able to get up from the floor.  To be able to go up and down steps, To be able to carry something and walk.    Currently in Pain? Yes    Pain Score 8     Pain Location Back    Pain Descriptors / Indicators Aching                [facility] PT Assessment - 03/03/21 0001       Assessment   Medical Diagnosis Spinal stenosis    Referring Provider (PT) Ihor Dow      Observation/Other Assessments   Focus on Therapeutic Outcomes (FOTO)  87% function, predicted 60% function      Transfers   Transfers Floor to Transfer;Stand to Sit;Sit to Stand    Sit to Stand 6: Modified independent (Device/Increase time);Without upper extremity assist    Stand to Sit 6: Modified independent (Device/Increase time);Without upper extremity assist    Floor to Transfer 6: Modified independent (Device/Increase time)    Transfer Cueing able to trouble shoot wiht right leg in front      Balance   Balance Assessed Yes      Static Standing Balance   Static Standing - Level of Assistance 6: Modified independent (Device/Increase time)    Static Standing Balance -  Activities  Single Leg Stance - Left Leg;Single Leg Stance - Right Leg    Static Standing - Comment/# of Minutes 5 seconds on either leg, feet together eyes closed - moderate sway but 15 seconds easily      Standardized Balance Assessment   Standardized Balance Assessment Dynamic Gait Index      Dynamic Gait Index   Level Surface Normal    Change in Gait Speed Normal    Gait with Horizontal Head Turns Mild Impairment    Gait with Vertical Head Turns Mild Impairment    Gait and  Pivot Turn Mild Impairment    Step Over Obstacle Normal    Step Around Obstacles Normal    Steps Moderate Impairment    Total Score 19                           [facility] Adult PT Treatment/Exercise - 03/03/21 0001       Knee/Hip Exercises: Standing   Forward Step Up 5 reps;4 sets;Hand Hold: 1   7" step   Other Standing Knee Exercises SLS with min assist with one UE x3 30" holds      Knee/Hip Exercises: Seated   Sit to Sand 5 reps;with UE support;3 sets  PT Short Term Goals - 03/03/21 1416       PT SHORT TERM GOAL #1   Title Pt will be independent with HEP to improve core and LE strength    Time 2    Period Weeks    Status Achieved    Target Date 02/19/21               PT Long Term Goals - 03/03/21 1409       PT LONG TERM GOAL #1   Title Pt will be independent with advanced HEP for core stabilization to be able to come sit to stand without UE assist.    Time 4    Period Weeks    Status Achieved      PT LONG TERM GOAL #2   Title Pt to be able to single leg stance on both Rt and Lt LE for at least 15 seconds to allow pt to state that he has not fallen in the past week.    Baseline reports last fall was last Friday - he tried to get up too quickly from supine and he hit the couch- 5 seconds on either leg    Time 4    Period Weeks    Status On-going      PT LONG TERM GOAL #3   Title PT to be able to I get from floor to a chair for recovery strategies if pt falls and there is nobody home.    Time 4    Period Weeks    Status Achieved      PT LONG TERM GOAL #4   Title Patient will score 24/24 on DGI to demonstrate improved dynamic balance    Time 4    Period Weeks    Status New    Target Date 03/31/21                   Plan - 03/03/21 1427     Personal Factors and Comorbidities Age;Comorbidity 3+    Comorbidities vertigo, spinal stenosis, back surgery    Examination-Activity Limitations  Carry;Lift;Transfers;Stairs;Squat    Examination-Participation Restrictions Cleaning;Yard Work    Stability/Clinical Decision Making Stable/Uncomplicated    Rehab Potential Good    PT Frequency 2x / week    PT Duration 4 weeks    PT Treatment/Interventions Balance training;Therapeutic exercise;Neuromuscular re-education;Stair training;Gait training    PT Next Visit Plan continue with core strengthening and balance exercises- dynamic and static and eyes open/closed.    PT Home Exercise Plan functional squat, bridge, Toe raise sitting; 8/29:  wall arch , tandem stance knee to chest scapular and cervical retraction.; 02/20/21: GTB postural strengthening; 9/13 lumbar felxion, side bending, STS with reaching, and extension; 9/19 SLS    Consulted and Agree with Plan of Care Patient             Patient will benefit from skilled therapeutic intervention in order to improve the following deficits and impairments:  Abnormal gait, Decreased balance, Decreased range of motion, Decreased strength, Pain, Postural dysfunction  Visit Diagnosis: Repeated falls  Chronic bilateral low back pain with right-sided sciatica  Muscle weakness (generalized)     Problem List Patient Active Problem List   Diagnosis Date Noted   Pain of both hip joints 01/15/2021   History of coronary artery bypass graft 01/15/2021   Asymptomatic carotid artery stenosis 01/15/2021   Bilateral pseudophakia 01/15/2021   Gastroesophageal reflux disease 01/15/2021   Osteopenia 01/15/2021   Right bundle branch block  01/15/2021   Skin cancer 01/15/2021   Spinal stenosis of lumbar region 01/15/2021   Vitamin D deficiency 01/15/2021   Hypothyroidism 01/15/2021   Encounter to establish care 01/15/2021   Benign localized prostatic hyperplasia with lower urinary tract symptoms (LUTS) 03/07/2019   Protein-calorie malnutrition, severe 09/11/2017   Hyperlipidemia    Cerebrovascular disease    Thrombocytopenia (HCC)    Erectile  dysfunction    CAD (coronary artery disease)    Hypertension 01/16/2009  2:39 PM, 03/03/21 Jerene Pitch, DPT Physical Therapy with [facility]  [phone] office   [facility] [address] [place], [place], [address] Phone: [phone]   Fax:  [phone]  Name: AREND BAHL MRN: [id number] Date of Birth: 1935/07/12

## 2021-03-05 ENCOUNTER — Ambulatory Visit (HOSPITAL_COMMUNITY): Payer: Medicare Other | Admitting: Physical Therapy

## 2021-03-05 ENCOUNTER — Other Ambulatory Visit: Payer: Self-pay

## 2021-03-05 DIAGNOSIS — R296 Repeated falls: Secondary | ICD-10-CM | POA: Diagnosis not present

## 2021-03-05 DIAGNOSIS — G8929 Other chronic pain: Secondary | ICD-10-CM

## 2021-03-05 DIAGNOSIS — M5441 Lumbago with sciatica, right side: Secondary | ICD-10-CM

## 2021-03-05 DIAGNOSIS — M6281 Muscle weakness (generalized): Secondary | ICD-10-CM

## 2021-03-05 NOTE — Therapy (Signed)
[facility] [place], [place], [address] Phone: [phone]   Fax:  [phone]  Physical Therapy Treatment  Patient Details  Name: Jorge Bishop MRN: [id number] Date of Birth: [birth year] Referring Provider (PT): Ihor Dow   Encounter Date: 03/05/2021   PT End of Session - 03/05/21 1702     Visit Number 9    Number of Visits 16    Date for PT Re-Evaluation 03/31/21    Authorization Type Medicare    Progress Note Due on Visit 18    PT Start Time 1620    PT Stop Time 1702    PT Time Calculation (min) 42 min    Activity Tolerance Patient tolerated treatment well;Patient limited by fatigue    Behavior During Therapy [facility] for tasks assessed/performed             Past Medical History:  Diagnosis Date   Arteriosclerotic cardiovascular disease (ASCVD)    Coronary artery bypass graft surgery in 12/98; DES to the RCA SVG in 07/2002; restenosis of the saphenous vein graft stent and renal intervention in 2006; 70% first diagonal present; calf in 11/2007-total obstruction of the saphenous vein graft to the first marginal   Benign prostatic hypertrophy    Cerebrovascular disease    60% bilateral internal carotid artery stenosis in 2012   Coronary artery disease    4 stents   Diaphragmatic hernia 01/15/2021   Diverticulosis    DJD (degenerative joint disease)    Erectile dysfunction    GERD (gastroesophageal reflux disease)    History of blood transfusion    History of kidney stones    Hyperlipidemia    Lipid profile in 12/2009:104, 91, 36, 50   Hypertension    Kidney stone 09/08/2017   Migraine headache    Right bundle branch block    Right ureteral stone 03/08/2019   Shortness of breath    Thrombocytopenia (HCC)    platelets of 104 in 03/2009   Upper gastrointestinal hemorrhage 72/09    Helicobacter pylori positive; subsequent laparoscopic fundoplication-2009    Past Surgical History:  Procedure Laterality Date    CARDIAC CATHETERIZATION     4 cardiac stents   CATARACT EXTRACTION W/PHACO Right 07/25/2012   Procedure: CATARACT EXTRACTION PHACO AND INTRAOCULAR LENS PLACEMENT ([place]);  Surgeon: Tonny Branch, MD;  [facility]: [facility];  Service: Ophthalmology;  Laterality: Right;  CDE:23.31   CATARACT EXTRACTION W/PHACO Left 08/18/2012   Procedure: CATARACT EXTRACTION PHACO AND INTRAOCULAR LENS PLACEMENT ([facility]);  Surgeon: Tonny Branch, MD;  Location: [facility];  Service: Ophthalmology;  Laterality: Left;  CDE: 16.07   COLONOSCOPY N/A 08/01/2014   Procedure: COLONOSCOPY;  Surgeon: Rogene Houston, MD;  [facility];  Service: Endoscopy;  Laterality: N/A;  125 - moved to 10:30 - Ann to notify pt   CORONARY ARTERY BYPASS GRAFT  1998    CYSTOSCOPY/URETEROSCOPY/HOLMIUM LASER/STENT PLACEMENT Right 11/12/2017   Procedure: CYSTOSCOPY/RIGHT RETROGRADE PYELOGRAM/RIGHT URETEROSCOPY/HOLMIUM LASER LITHOTRIPSY RIGHT URETERAL CALCULUS/RIGHT URETERAL STENT PLACEMENT;  Surgeon: [name], MD;  Location: [facility];  Service: Urology;  Laterality: Right;  right ureteral calculus given to patient's wife per Dr. [name]   CYSTOSCOPY/URETEROSCOPY/HOLMIUM LASER/STENT PLACEMENT Right 03/07/2019   Procedure: CYSTOSCOPY RIGHT RETROGRADE RIGHT URETEROSCOPY;  Surgeon: [name], MD;  [facility];  Service: Urology;  Laterality: Right;   EXTRACORPOREAL SHOCK WAVE LITHOTRIPSY Right 09/13/2017   Procedure: RIGHT EXTRACORPOREAL SHOCK WAVE LITHOTRIPSY (ESWL);  Surgeon: Lucas Mallow, MD;  Location: [facility];  Service: Urology;  Laterality: Right;   HERNIA REPAIR     right inguinal   LAPAROSCOPIC NISSEN FUNDOPLICATION  8676, 1950    revision in 2009 along with repair of paraesophagel hernia    PARAESOPHAGEAL HERNIA REPAIR     TRANSURETHRAL RESECTION OF PROSTATE N/A 03/07/2019   Procedure: TRANSURETHRAL RESECTION OF THE PROSTATE (TURP);  Surgeon: [name], MD;  [facility];  Service: Urology;  Laterality: N/A;    There were no vitals  filed for this visit.   Subjective Assessment - 03/05/21 1621     Subjective pt states his most limiting things are getting out of chair and his balance.  States he is doing well today overall but the humidity is making it more difficult to breathe with his mask on.    Currently in Pain? No/denies                               [facility] Adult PT Treatment/Exercise - 03/05/21 0001       Knee/Hip Exercises: Standing   Forward Lunges Both;2 sets;10 reps    Forward Lunges Limitations onto BOSU intermittent HHA    Functional Squat 2 sets;5 reps    Functional Squat Limitations on BOSU ball with intermittent HHA    SLS with Vectors 5x5" BLE 1 HHA    Other Standing Knee Exercises retro, tandem and sidestepping 3RT each    Other Standing Knee Exercises pulleys with 5pl resistance fwd/backward 4RT and laterals 3PL 5RT each way      Knee/Hip Exercises: Seated   Sit to Sand 10 reps;without UE support                       PT Short Term Goals - 03/03/21 1416       PT SHORT TERM GOAL #1   Title Pt will be independent with HEP to improve core and LE strength    Time 2    Period Weeks    Status Achieved    Target Date 02/19/21               PT Long Term Goals - 03/03/21 1409       PT LONG TERM GOAL #1   Title Pt will be independent with advanced HEP for core stabilization to be able to come sit to stand without UE assist.    Time 4    Period Weeks    Status Achieved      PT LONG TERM GOAL #2   Title Pt to be able to single leg stance on both Rt and Lt LE for at least 15 seconds to allow pt to state that he has not fallen in the past week.    Baseline reports last fall was last Friday - he tried to get up too quickly from supine and he hit the couch- 5 seconds on either leg    Time 4    Period Weeks    Status On-going      PT LONG TERM GOAL #3   Title PT to be able to I get from floor to a chair for recovery strategies if pt falls and there is  nobody home.    Time 4    Period Weeks    Status Achieved      PT LONG TERM GOAL #4   Title Patient will score 24/24 on DGI to demonstrate improved dynamic balance  Time 4    Period Weeks    Status New    Target Date 03/31/21                   Plan - 03/05/21 1700     Clinical Impression Statement Progressed challenges today with strength and balance.  Pt able to complete full set of sit to stands without UE assist and squats using BOSU.  Pt required cues for form and keeping weight over heels with both activities.  Lunges also progressed on BOSU with ability to maintain stability without UE assist.  Tandem gait added with challenge noted and initially unable to complete without looking down and unable to maintain stability.  Able to improve on 3rd RT maintaining forward gaze and stability.  Retro and sidestepping completed with good form and control.  Added resisted walking forward/retro and sidestepping with cues for control and noted challenge.  Rt leading laterals most challenging to control    Personal Factors and Comorbidities Age;Comorbidity 3+    Comorbidities vertigo, spinal stenosis, back surgery    Examination-Activity Limitations Carry;Lift;Transfers;Stairs;Squat    Examination-Participation Restrictions Cleaning;Yard Work    Stability/Clinical Decision Making Stable/Uncomplicated    Rehab Potential Good    PT Frequency 2x / week    PT Duration 4 weeks    PT Treatment/Interventions Balance training;Therapeutic exercise;Neuromuscular re-education;Stair training;Gait training    PT Next Visit Plan continue with core strengthening and balance exercises- dynamic and static and eyes open/closed.    PT Home Exercise Plan functional squat, bridge, Toe raise sitting; 8/29:  wall arch , tandem stance knee to chest scapular and cervical retraction.; 02/20/21: GTB postural strengthening; 9/13 lumbar felxion, side bending, STS with reaching, and extension; 9/19 SLS    Consulted  and Agree with Plan of Care Patient             Patient will benefit from skilled therapeutic intervention in order to improve the following deficits and impairments:  Abnormal gait, Decreased balance, Decreased range of motion, Decreased strength, Pain, Postural dysfunction  Visit Diagnosis: Repeated falls  Chronic bilateral low back pain with right-sided sciatica  Muscle weakness (generalized)     Problem List Patient Active Problem List   Diagnosis Date Noted   Pain of both hip joints 01/15/2021   History of coronary artery bypass graft 01/15/2021   Asymptomatic carotid artery stenosis 01/15/2021   Bilateral pseudophakia 01/15/2021   Gastroesophageal reflux disease 01/15/2021   Osteopenia 01/15/2021   Right bundle branch block 01/15/2021   Skin cancer 01/15/2021   Spinal stenosis of lumbar region 01/15/2021   Vitamin D deficiency 01/15/2021   Hypothyroidism 01/15/2021   Encounter to establish care 01/15/2021   Benign localized prostatic hyperplasia with lower urinary tract symptoms (LUTS) 03/07/2019   Protein-calorie malnutrition, severe 09/11/2017   Hyperlipidemia    Cerebrovascular disease    Thrombocytopenia (HCC)    Erectile dysfunction    CAD (coronary artery disease)    Hypertension 01/16/2009   Teena Irani, PTA/CLT [phone]  Teena Irani, PTA 03/05/2021, 5:05 PM  [facility] [address] [place], [place], [address] Phone: [phone]   Fax:  [phone]  Name: Jorge Bishop MRN: [id number] Date of Birth: 10/26/1935

## 2021-03-10 ENCOUNTER — Other Ambulatory Visit: Payer: Self-pay

## 2021-03-10 ENCOUNTER — Ambulatory Visit (HOSPITAL_COMMUNITY): Payer: Medicare Other | Admitting: Physical Therapy

## 2021-03-10 DIAGNOSIS — R296 Repeated falls: Secondary | ICD-10-CM

## 2021-03-10 DIAGNOSIS — M6281 Muscle weakness (generalized): Secondary | ICD-10-CM

## 2021-03-10 DIAGNOSIS — G8929 Other chronic pain: Secondary | ICD-10-CM

## 2021-03-10 NOTE — Therapy (Signed)
[place], [place], [address] Phone: [phone]   Fax:  [phone]  Physical Therapy Treatment  Patient Details  Name: Jorge Bishop MRN: [id number] Date of Birth: Dec 30, 1935 Referring Provider (PT): Ihor Dow   Encounter Date: 03/10/2021   PT End of Session - 03/10/21 1233     Visit Number 10    Number of Visits 16    Date for PT Re-Evaluation 03/31/21    Authorization Type Medicare    Progress Note Due on Visit 18    PT Start Time 1134    PT Stop Time 1215    PT Time Calculation (min) 41 min    Activity Tolerance Patient tolerated treatment well;Patient limited by fatigue    Behavior During Therapy [facility] for tasks assessed/performed             Past Medical History:  Diagnosis Date   Arteriosclerotic cardiovascular disease (ASCVD)    Coronary artery bypass graft surgery in 12/98; DES to the RCA SVG in 07/2002; restenosis of the saphenous vein graft stent and renal intervention in 2006; 70% first diagonal present; calf in 11/2007-total obstruction of the saphenous vein graft to the first marginal   Benign prostatic hypertrophy    Cerebrovascular disease    60% bilateral internal carotid artery stenosis in 2012   Coronary artery disease    4 stents   Diaphragmatic hernia 01/15/2021   Diverticulosis    DJD (degenerative joint disease)    Erectile dysfunction    GERD (gastroesophageal reflux disease)    History of blood transfusion    History of kidney stones    Hyperlipidemia    Lipid profile in 12/2009:104, 91, 36, 50   Hypertension    Kidney stone 09/08/2017   Migraine headache    Right bundle branch block    Right ureteral stone 03/08/2019   Shortness of breath    Thrombocytopenia (HCC)    platelets of 104 in 03/2009   Upper gastrointestinal hemorrhage 02/11    Helicobacter pylori positive; subsequent laparoscopic fundoplication-2009    Past Surgical History:  Procedure Laterality Date    CARDIAC CATHETERIZATION     4 cardiac stents   CATARACT EXTRACTION W/PHACO Right 07/25/2012   Procedure: CATARACT EXTRACTION PHACO AND INTRAOCULAR LENS PLACEMENT ([place]);  Surgeon: Tonny Branch, MD;  [facility];  Service: Ophthalmology;  Laterality: Right;  CDE:23.31   CATARACT EXTRACTION W/PHACO Left 08/18/2012   Procedure: CATARACT EXTRACTION PHACO AND INTRAOCULAR LENS PLACEMENT ([facility]);  Surgeon: Tonny Branch, MD;  Location: [facility];  Service: Ophthalmology;  Laterality: Left;  CDE: 16.07   COLONOSCOPY N/A 08/01/2014   Procedure: COLONOSCOPY;  Surgeon: Rogene Houston, MD;  [facility];  Service: Endoscopy;  Laterality: N/A;  125 - moved to 10:30 - Ann to notify pt   CORONARY ARTERY BYPASS GRAFT  1998    CYSTOSCOPY/URETEROSCOPY/HOLMIUM LASER/STENT PLACEMENT Right 11/12/2017   Procedure: CYSTOSCOPY/RIGHT RETROGRADE PYELOGRAM/RIGHT URETEROSCOPY/HOLMIUM LASER LITHOTRIPSY RIGHT URETERAL CALCULUS/RIGHT URETERAL STENT PLACEMENT;  Surgeon: [name], MD;  Location: [facility];  Service: Urology;  Laterality: Right;  right ureteral calculus given to patient's wife per Dr. [name]   CYSTOSCOPY/URETEROSCOPY/HOLMIUM LASER/STENT PLACEMENT Right 03/07/2019   Procedure: CYSTOSCOPY RIGHT RETROGRADE RIGHT URETEROSCOPY;  Surgeon: [name], MD;  [facility];  Service: Urology;  Laterality: Right;   EXTRACORPOREAL SHOCK WAVE LITHOTRIPSY Right 09/13/2017   Procedure: RIGHT EXTRACORPOREAL SHOCK WAVE LITHOTRIPSY (ESWL);  Surgeon: Lucas Mallow, MD;  Location: [facility];  Service: Urology;  Laterality: Right;   HERNIA REPAIR     right inguinal   LAPAROSCOPIC NISSEN FUNDOPLICATION  1610, 9604    revision in 2009 along with repair of paraesophagel hernia    PARAESOPHAGEAL HERNIA REPAIR     TRANSURETHRAL RESECTION OF PROSTATE N/A 03/07/2019   Procedure: TRANSURETHRAL RESECTION OF THE PROSTATE (TURP);  Surgeon: [name], MD;  [facility];  Service: Urology;  Laterality: N/A;    There were no vitals  filed for this visit.   Subjective Assessment - 03/10/21 1156     Subjective Pt reports compliance with his exercises.  States his legs are little sore from working on his daughters bench.    Currently in Pain? No/denies                               [facility] Adult PT Treatment/Exercise - 03/10/21 0001       Knee/Hip Exercises: Machines for Strengthening   Cybex Leg Press 4 PL 2X10      Knee/Hip Exercises: Standing   Forward Lunges Both;2 sets;10 reps    Forward Lunges Limitations onto BOSU intermittent HHA    Lateral Step Up Both;Hand Hold: 1;Step Height: 6";2 sets    Functional Squat 10 reps    Functional Squat Limitations on BOSU ball with intermittent HHA    SLS with Vectors 5x5" BLE 1 HHA    Other Standing Knee Exercises retro, tandem and sidestepping 3RT each    Other Standing Knee Exercises pulleys with 5pl resistance fwd, 4pl backward , 4pl to Rt and  3PL  to Lt; 5RT each way      Knee/Hip Exercises: Seated   Sit to Sand 10 reps;without UE support                       PT Short Term Goals - 03/03/21 1416       PT SHORT TERM GOAL #1   Title Pt will be independent with HEP to improve core and LE strength    Time 2    Period Weeks    Status Achieved    Target Date 02/19/21               PT Long Term Goals - 03/03/21 1409       PT LONG TERM GOAL #1   Title Pt will be independent with advanced HEP for core stabilization to be able to come sit to stand without UE assist.    Time 4    Period Weeks    Status Achieved      PT LONG TERM GOAL #2   Title Pt to be able to single leg stance on both Rt and Lt LE for at least 15 seconds to allow pt to state that he has not fallen in the past week.    Baseline reports last fall was last Friday - he tried to get up too quickly from supine and he hit the couch- 5 seconds on either leg    Time 4    Period Weeks    Status On-going      PT LONG TERM GOAL #3   Title PT to be able to I get  from floor to a chair for recovery strategies if pt falls and there is nobody home.    Time 4    Period Weeks    Status Achieved  PT LONG TERM GOAL #4   Title Patient will score 24/24 on DGI to demonstrate improved dynamic balance    Time 4    Period Weeks    Status New    Target Date 03/31/21                   Plan - 03/10/21 1231     Clinical Impression Statement Began session with weighted step challenge.  Able to increase to 5Pl for lateral stepping to Rt and 4PL to the Lt as reduced eccentric control on return.  Continued with same weight forward ambulation but reduced to 4PL for retro due to eccentric control.  Continued with strengthening using BOSU with encouragement to reduce UE assist.  Cues to reduce forward trunk flexion with standing from chair without UE assist.  Added leg press to POC today and resumed repeated step ups using 4" step and UE assist.   Noted fatigue in LE's at end of session today    Personal Factors and Comorbidities Age;Comorbidity 3+    Comorbidities vertigo, spinal stenosis, back surgery    Examination-Activity Limitations Carry;Lift;Transfers;Stairs;Squat    Examination-Participation Restrictions Cleaning;Yard Work    Stability/Clinical Decision Making Stable/Uncomplicated    Rehab Potential Good    PT Frequency 2x / week    PT Duration 4 weeks    PT Treatment/Interventions Balance training;Therapeutic exercise;Neuromuscular re-education;Stair training;Gait training    PT Next Visit Plan continue with core strengthening and balance exercises- dynamic and static and eyes open/closed.  Progress to balance beam next session for lateral and forward challenges.    PT Home Exercise Plan functional squat, bridge, Toe raise sitting; 8/29:  wall arch , tandem stance knee to chest scapular and cervical retraction.; 02/20/21: GTB postural strengthening; 9/13 lumbar felxion, side bending, STS with reaching, and extension; 9/19 SLS    Consulted and Agree  with Plan of Care Patient             Patient will benefit from skilled therapeutic intervention in order to improve the following deficits and impairments:  Abnormal gait, Decreased balance, Decreased range of motion, Decreased strength, Pain, Postural dysfunction  Visit Diagnosis: Repeated falls  Chronic bilateral low back pain with right-sided sciatica  Muscle weakness (generalized)     Problem List Patient Active Problem List   Diagnosis Date Noted   Pain of both hip joints 01/15/2021   History of coronary artery bypass graft 01/15/2021   Asymptomatic carotid artery stenosis 01/15/2021   Bilateral pseudophakia 01/15/2021   Gastroesophageal reflux disease 01/15/2021   Osteopenia 01/15/2021   Right bundle branch block 01/15/2021   Skin cancer 01/15/2021   Spinal stenosis of lumbar region 01/15/2021   Vitamin D deficiency 01/15/2021   Hypothyroidism 01/15/2021   Encounter to establish care 01/15/2021   Benign localized prostatic hyperplasia with lower urinary tract symptoms (LUTS) 03/07/2019   Protein-calorie malnutrition, severe 09/11/2017   Hyperlipidemia    Cerebrovascular disease    Thrombocytopenia (HCC)    Erectile dysfunction    CAD (coronary artery disease)    Hypertension 01/16/2009   Teena Irani, PTA/CLT [phone]   Teena Irani, PTA 03/10/2021, 12:33 PM  [place] [address] [place], [place], [address] Phone: [phone]   Fax:  [phone]  Name: Jorge Bishop MRN: [id number] Date of Birth: Jun 11, 1936

## 2021-03-18 ENCOUNTER — Encounter (HOSPITAL_COMMUNITY): Payer: Medicare Other | Admitting: Physical Therapy

## 2021-03-20 ENCOUNTER — Other Ambulatory Visit: Payer: Self-pay

## 2021-03-20 ENCOUNTER — Ambulatory Visit (HOSPITAL_COMMUNITY): Payer: Medicare Other | Attending: Internal Medicine | Admitting: Physical Therapy

## 2021-03-20 DIAGNOSIS — M6281 Muscle weakness (generalized): Secondary | ICD-10-CM | POA: Insufficient documentation

## 2021-03-20 DIAGNOSIS — R296 Repeated falls: Secondary | ICD-10-CM | POA: Diagnosis not present

## 2021-03-20 DIAGNOSIS — M5441 Lumbago with sciatica, right side: Secondary | ICD-10-CM | POA: Diagnosis present

## 2021-03-20 DIAGNOSIS — G8929 Other chronic pain: Secondary | ICD-10-CM | POA: Diagnosis present

## 2021-03-20 NOTE — Therapy (Signed)
[place], [place], [address] Phone: [phone]   Fax:  [phone]  Physical Therapy Treatment  Patient Details  Name: Jorge Bishop MRN: [id number] Date of Birth: 1936/04/27 Referring Provider (PT): Ihor Dow  PHYSICAL THERAPY DISCHARGE SUMMARY  Visits from Start of Care: 11  Current functional level related to goals / functional outcomes: Able to come sit to stand I, able to get off the floor I    Remaining deficits: Pt improved but still has balance issues    Education / Equipment: HEP   Patient agrees to discharge. Patient goals were met. Patient is being discharged due to being pleased with the current functional level.   Encounter Date: 03/20/2021   PT End of Session - 03/20/21 1419     Visit Number 11    Number of Visits 11    Date for PT Re-Evaluation 03/31/21    Authorization Type Medicare    Progress Note Due on Visit 11    PT Start Time 1400    PT Stop Time 1430    PT Time Calculation (min) 30 min    Activity Tolerance Patient tolerated treatment well;Patient limited by fatigue    Behavior During Therapy [facility] for tasks assessed/performed             Past Medical History:  Diagnosis Date   Arteriosclerotic cardiovascular disease (ASCVD)    Coronary artery bypass graft surgery in 12/98; DES to the RCA SVG in 07/2002; restenosis of the saphenous vein graft stent and renal intervention in 2006; 70% first diagonal present; calf in 11/2007-total obstruction of the saphenous vein graft to the first marginal   Benign prostatic hypertrophy    Cerebrovascular disease    60% bilateral internal carotid artery stenosis in 2012   Coronary artery disease    4 stents   Diaphragmatic hernia 01/15/2021   Diverticulosis    DJD (degenerative joint disease)    Erectile dysfunction    GERD (gastroesophageal reflux disease)    History of blood transfusion    History of kidney stones    Hyperlipidemia     Lipid profile in 12/2009:104, 91, 36, 50   Hypertension    Kidney stone 09/08/2017   Migraine headache    Right bundle branch block    Right ureteral stone 03/08/2019   Shortness of breath    Thrombocytopenia (HCC)    platelets of 104 in 03/2009   Upper gastrointestinal hemorrhage 31/59    Helicobacter pylori positive; subsequent laparoscopic fundoplication-2009    Past Surgical History:  Procedure Laterality Date   CARDIAC CATHETERIZATION     4 cardiac stents   CATARACT EXTRACTION W/PHACO Right 07/25/2012   Procedure: CATARACT EXTRACTION PHACO AND INTRAOCULAR LENS PLACEMENT ([place]);  Surgeon: Tonny Branch, MD;  [facility]: [facility];  Service: Ophthalmology;  Laterality: Right;  CDE:23.31   CATARACT EXTRACTION W/PHACO Left 08/18/2012   Procedure: CATARACT EXTRACTION PHACO AND INTRAOCULAR LENS PLACEMENT ([facility]);  Surgeon: Tonny Branch, MD;  Location: [facility];  Service: Ophthalmology;  Laterality: Left;  CDE: 16.07   COLONOSCOPY N/A 08/01/2014   Procedure: COLONOSCOPY;  Surgeon: Rogene Houston, MD;  [facility];  Service: Endoscopy;  Laterality: N/A;  125 - moved to 10:30 - Ann to notify pt   CORONARY ARTERY BYPASS GRAFT  1998    CYSTOSCOPY/URETEROSCOPY/HOLMIUM LASER/STENT PLACEMENT Right 11/12/2017   Procedure: CYSTOSCOPY/RIGHT RETROGRADE PYELOGRAM/RIGHT URETEROSCOPY/HOLMIUM LASER LITHOTRIPSY RIGHT URETERAL CALCULUS/RIGHT URETERAL STENT PLACEMENT;  Surgeon: [name], MD;  Location: [facility];  Service: Urology;  Laterality: Right;  right ureteral calculus given to patient's wife per Dr. [name]   CYSTOSCOPY/URETEROSCOPY/HOLMIUM LASER/STENT PLACEMENT Right 03/07/2019   Procedure: CYSTOSCOPY RIGHT RETROGRADE RIGHT URETEROSCOPY;  Surgeon: [name], MD;  [facility];  Service: Urology;  Laterality: Right;   EXTRACORPOREAL SHOCK WAVE LITHOTRIPSY Right 09/13/2017   Procedure: RIGHT EXTRACORPOREAL SHOCK WAVE LITHOTRIPSY (ESWL);  Surgeon: Lucas Mallow, MD;  [facility];  Service:  Urology;  Laterality: Right;   HERNIA REPAIR     right inguinal   LAPAROSCOPIC NISSEN FUNDOPLICATION  5374, 8270    revision in 2009 along with repair of paraesophagel hernia    PARAESOPHAGEAL HERNIA REPAIR     TRANSURETHRAL RESECTION OF PROSTATE N/A 03/07/2019   Procedure: TRANSURETHRAL RESECTION OF THE PROSTATE (TURP);  Surgeon: [name], MD;  [facility];  Service: Urology;  Laterality: N/A;    There were no vitals filed for this visit.   Subjective Assessment - 03/20/21 1400     Subjective Pt states that he fell yesterday but was able to get up on his own    Patient Stated Goals To be able to get up from the floor.  To be able to go up and down steps, To be able to carry something and walk.                [facility] PT Assessment - 03/20/21 0001       Assessment   Medical Diagnosis Spinal stenosis    Referring Provider (PT) Ihor Dow    Prior Therapy years ago      Precautions   Precautions Fall      Restrictions   Weight Bearing Restrictions No      [place] residence    Type of [place] to enter    [employer] or work area in basement      Prior Function   Level of Independence Independent      Cognition   Overall Cognitive Status Within Functional Limits for tasks assessed      Observation/Other Assessments   Focus on Therapeutic Outcomes (FOTO)  87 was 54; 46% affected      Functional Tests   Functional tests Single leg stance;Sit to Stand      Single Leg Stance   Comments 10" B was 6" on RT; LT 8'      Sit to Stand   Comments 7 in 30", was unable to rise without using his hands.      Posture/Postural Control   Posture Comments flat back; extension limited to 15 degrees      Strength   Right Hip Flexion 5/5    Right Hip Extension 4+/5   was 4/5   Right Hip ABduction 4+/5   was 4/5   Left Hip Flexion 5/5    Left Hip Extension 4+/5   was 4/5   Left Hip ABduction 4+/5     Right Knee Extension 5/5    Left Knee Extension 5/5    Right Ankle Dorsiflexion 5/5   was 3+   Left Ankle Dorsiflexion 5/5   was 3+     Dynamic Gait Index   Level Surface Normal    Change in Gait Speed Normal    Gait with Horizontal Head Turns Normal    Gait with Vertical Head Turns Normal    Gait and Pivot Turn Normal  Step Over Obstacle Normal    Step Around Obstacles Normal    Steps Normal    Total Score 24                           [facility] Adult PT Treatment/Exercise - 03/20/21 0001       Knee/Hip Exercises: Standing   SLS x5 B max B 10"      Knee/Hip Exercises: Seated   Sit to Sand 10 reps      Knee/Hip Exercises: Prone   Hip Extension Strengthening;Both;10 reps                       PT Short Term Goals - 03/20/21 1419       PT SHORT TERM GOAL #1   Title Pt will be independent with HEP to improve core and LE strength    Time 2    Period Weeks    Status Achieved    Target Date 02/19/21               PT Long Term Goals - 03/20/21 1419       PT LONG TERM GOAL #1   Title Pt will be independent with advanced HEP for core stabilization to be able to come sit to stand without UE assist.    Time 4    Period Weeks    Status Achieved      PT LONG TERM GOAL #2   Title Pt to be able to single leg stance on both Rt and Lt LE for at least 15 seconds to allow pt to state that he has not fallen in the past week.    Baseline reports last fall was last Friday - he tried to get up too quickly from supine and he hit the couch- 5 seconds on either leg    Time 4    Period Weeks    Status On-going      PT LONG TERM GOAL #3   Title PT to be able to I get from floor to a chair for recovery strategies if pt falls and there is nobody home.    Time 4    Period Weeks    Status Achieved      PT LONG TERM GOAL #4   Title Patient will score 24/24 on DGI to demonstrate improved dynamic balance    Time 4    Period Weeks    Status Achieved                    Plan - 03/20/21 1426     Clinical Impression Statement Reassessed pt all goals have been met.  Pt is ready for discharge.    Personal Factors and Comorbidities Age;Comorbidity 3+    Comorbidities vertigo, spinal stenosis, back surgery    Examination-Activity Limitations Carry;Lift;Transfers;Stairs;Squat    Examination-Participation Restrictions Cleaning;Yard Work    Stability/Clinical Decision Making Stable/Uncomplicated    Rehab Potential Good    PT Frequency 2x / week    PT Duration 4 weeks    PT Treatment/Interventions Balance training;Therapeutic exercise;Neuromuscular re-education;Stair training;Gait training    PT Next Visit Plan continue with core strengthening and balance exercises- dynamic and static and eyes open/closed.  Progress to balance beam next session for lateral and forward challenges.    PT Home Exercise Plan functional squat, bridge, Toe raise sitting; 8/29:  wall arch , tandem stance knee to chest scapular and cervical  retraction.; 02/20/21: GTB postural strengthening; 9/13 lumbar felxion, side bending, STS with reaching, and extension; 9/19 SLS    Consulted and Agree with Plan of Care Patient             Patient will benefit from skilled therapeutic intervention in order to improve the following deficits and impairments:  Abnormal gait, Decreased balance, Decreased range of motion, Decreased strength, Pain, Postural dysfunction  Visit Diagnosis: Repeated falls  Chronic bilateral low back pain with right-sided sciatica  Muscle weakness (generalized)     Problem List Patient Active Problem List   Diagnosis Date Noted   Pain of both hip joints 01/15/2021   History of coronary artery bypass graft 01/15/2021   Asymptomatic carotid artery stenosis 01/15/2021   Bilateral pseudophakia 01/15/2021   Gastroesophageal reflux disease 01/15/2021   Osteopenia 01/15/2021   Right bundle branch block 01/15/2021   Skin cancer 01/15/2021    Spinal stenosis of lumbar region 01/15/2021   Vitamin D deficiency 01/15/2021   Hypothyroidism 01/15/2021   Encounter to establish care 01/15/2021   Benign localized prostatic hyperplasia with lower urinary tract symptoms (LUTS) 03/07/2019   Protein-calorie malnutrition, severe 09/11/2017   Hyperlipidemia    Cerebrovascular disease    Thrombocytopenia (HCC)    Erectile dysfunction    CAD (coronary artery disease)    Hypertension 01/16/2009  Rayetta Humphrey, PT CLT [phone]  03/20/2021, 2:32 PM  [place] [address] [place], [place], [address] Phone: [phone]   Fax:  [phone]  Name: MARQUELLE MUSGRAVE MRN: [id number] Date of Birth: 27-Nov-1935

## 2021-03-25 ENCOUNTER — Encounter (HOSPITAL_COMMUNITY): Payer: Medicare Other | Admitting: Physical Therapy

## 2021-03-27 ENCOUNTER — Encounter (HOSPITAL_COMMUNITY): Payer: Medicare Other | Admitting: Physical Therapy

## 2021-03-31 ENCOUNTER — Encounter (HOSPITAL_COMMUNITY): Payer: Medicare Other | Admitting: Physical Therapy

## 2021-04-02 ENCOUNTER — Ambulatory Visit (HOSPITAL_COMMUNITY): Payer: Medicare Other

## 2021-04-03 ENCOUNTER — Encounter (HOSPITAL_COMMUNITY): Payer: Medicare Other

## 2021-05-14 DIAGNOSIS — G8220 Paraplegia, unspecified: Secondary | ICD-10-CM | POA: Insufficient documentation

## 2021-05-20 ENCOUNTER — Encounter: Payer: Self-pay | Admitting: Neurology

## 2021-05-21 ENCOUNTER — Other Ambulatory Visit: Payer: Self-pay

## 2021-05-21 ENCOUNTER — Encounter: Payer: Self-pay | Admitting: Internal Medicine

## 2021-05-21 ENCOUNTER — Ambulatory Visit (INDEPENDENT_AMBULATORY_CARE_PROVIDER_SITE_OTHER): Payer: Medicare Other | Admitting: Internal Medicine

## 2021-05-21 VITALS — BP 138/78 | HR 80 | Resp 18 | Ht 72.0 in | Wt 181.1 lb

## 2021-05-21 DIAGNOSIS — K219 Gastro-esophageal reflux disease without esophagitis: Secondary | ICD-10-CM | POA: Diagnosis not present

## 2021-05-21 DIAGNOSIS — I10 Essential (primary) hypertension: Secondary | ICD-10-CM

## 2021-05-21 DIAGNOSIS — E039 Hypothyroidism, unspecified: Secondary | ICD-10-CM

## 2021-05-21 DIAGNOSIS — R7303 Prediabetes: Secondary | ICD-10-CM

## 2021-05-21 DIAGNOSIS — G609 Hereditary and idiopathic neuropathy, unspecified: Secondary | ICD-10-CM | POA: Diagnosis not present

## 2021-05-21 MED ORDER — GABAPENTIN 100 MG PO CAPS: 100.0000 mg | ORAL_CAPSULE | Freq: Every day | ORAL | 0 refills | Status: DC

## 2021-05-21 NOTE — Assessment & Plan Note (Addendum)
Started Gabapentin 100 mg qHS, plan to increase dose as tolerated Advised to take Vit B12 1000 mcg QD Going to see Neurology

## 2021-05-21 NOTE — Patient Instructions (Signed)
Please start taking Gabapentin for neuropathy.  Please start taking Vitamin B12 1000 mcg everyday.  Please continue taking other medications as prescribed.

## 2021-05-21 NOTE — Assessment & Plan Note (Signed)
On Levothyroxine 50 mcg QD Last TSH from [place] [facility] wnl Check TSH and free T4

## 2021-05-21 NOTE — Progress Notes (Signed)
Established Patient Office Visit  Subjective:  Patient ID: Jorge Bishop, male    DOB: [birth year]  Age: 85 y.o. MRN: [id number]  CC:  Chief Complaint  Patient presents with   Follow-up    4 month follow up wanted to discuss peripheral neuropathy back surgeon cant do anything pt keeps falling due to this is unsteady when he stands     HPI Jorge Bishop is a 85 y.o. male with past medical history of CAD s/p CABG, hypertension, HLD, prediabetes, hypothyroidism, GERD, lumbar spinal stenosis s/p lumbar laminectomy and BPH who presents for f/u of his chronic medical conditions.  CAD s/p CABG and hypertension: BP is well-controlled. Takes medications regularly. Patient denies headache, dizziness, chest pain, dyspnea or palpitations.  He follows up with cardiology.  He is on Coreg, aspirin and atorvastatin.  S/p lumbar laminectomy: Around 2019.  He has been having gait problems since then.  He has had recurrent falls in the last 6 months.  He also has bilateral LE numbness and tingling, which is intermittent.  He also reports history of peripheral neuropathy. He had visit with Spine surgeon and has been referred to Neurology. He has not been on any medications for neuropathy yet. He has tried PT with no benefit.  He has brought blood tests from [place], which have been reviewed - stable.  Past Medical History:  Diagnosis Date   Arteriosclerotic cardiovascular disease (ASCVD)    Coronary artery bypass graft surgery in 12/98; DES to the RCA SVG in 07/2002; restenosis of the saphenous vein graft stent and renal intervention in 2006; 70% first diagonal present; calf in 11/2007-total obstruction of the saphenous vein graft to the first marginal   Benign prostatic hypertrophy    Cerebrovascular disease    60% bilateral internal carotid artery stenosis in 2012   Coronary artery disease    4 stents   Diaphragmatic hernia 01/15/2021   Diverticulosis    DJD (degenerative joint disease)    Erectile  dysfunction    GERD (gastroesophageal reflux disease)    History of blood transfusion    History of kidney stones    Hyperlipidemia    Lipid profile in 12/2009:104, 91, 36, 50   Hypertension    Kidney stone 09/08/2017   Migraine headache    Right bundle branch block    Right ureteral stone 03/08/2019   Shortness of breath    Thrombocytopenia (HCC)    platelets of 104 in 03/2009   Upper gastrointestinal hemorrhage 15/17    Helicobacter pylori positive; subsequent laparoscopic fundoplication-2009    Past Surgical History:  Procedure Laterality Date   CARDIAC CATHETERIZATION     4 cardiac stents   CATARACT EXTRACTION W/PHACO Right 07/25/2012   Procedure: CATARACT EXTRACTION PHACO AND INTRAOCULAR LENS PLACEMENT ([place]);  Surgeon: Tonny Branch, MD;  Location: [facility];  Service: Ophthalmology;  Laterality: Right;  CDE:23.31   CATARACT EXTRACTION W/PHACO Left 08/18/2012   Procedure: CATARACT EXTRACTION PHACO AND INTRAOCULAR LENS PLACEMENT ([facility]);  Surgeon: Tonny Branch, MD;  Location: [facility];  Service: Ophthalmology;  Laterality: Left;  CDE: 16.07   COLONOSCOPY N/A 08/01/2014   Procedure: COLONOSCOPY;  Surgeon: Rogene Houston, MD;  Location: [facility];  Service: Endoscopy;  Laterality: N/A;  125 - moved to 10:30 - Ann to notify pt   CORONARY ARTERY BYPASS GRAFT  1998    CYSTOSCOPY/URETEROSCOPY/HOLMIUM LASER/STENT PLACEMENT Right 11/12/2017   Procedure: CYSTOSCOPY/RIGHT RETROGRADE PYELOGRAM/RIGHT URETEROSCOPY/HOLMIUM LASER LITHOTRIPSY RIGHT URETERAL CALCULUS/RIGHT URETERAL STENT PLACEMENT;  Surgeon:  [name], MD;  Location: [facility];  Service: Urology;  Laterality: Right;  right ureteral calculus given to patient's wife per Dr. [name]   CYSTOSCOPY/URETEROSCOPY/HOLMIUM LASER/STENT PLACEMENT Right 03/07/2019   Procedure: CYSTOSCOPY RIGHT RETROGRADE RIGHT URETEROSCOPY;  Surgeon: [name], MD;  Location: [facility];  Service: Urology;  Laterality: Right;   EXTRACORPOREAL SHOCK WAVE LITHOTRIPSY Right 09/13/2017    Procedure: RIGHT EXTRACORPOREAL SHOCK WAVE LITHOTRIPSY (ESWL);  Surgeon: Lucas Mallow, MD;  Location: [facility];  Service: Urology;  Laterality: Right;   HERNIA REPAIR     right inguinal   LAPAROSCOPIC NISSEN FUNDOPLICATION  7510, 2585    revision in 2009 along with repair of paraesophagel hernia    PARAESOPHAGEAL HERNIA REPAIR     TRANSURETHRAL RESECTION OF PROSTATE N/A 03/07/2019   Procedure: TRANSURETHRAL RESECTION OF THE PROSTATE (TURP);  Surgeon: [name], MD;  Location: [facility];  Service: Urology;  Laterality: N/A;    Family History  Problem Relation Age of Onset   CAD Father     Social History   Socioeconomic History   Marital status: Married    Spouse name: Not on file   Number of children: Not on file   Years of education: Not on file   Highest education level: Not on file  Occupational History   Occupation: Full time    Employer: RETIRED  Tobacco Use   Smoking status: Never   Smokeless tobacco: Never  Vaping Use   Vaping Use: Never used  Substance and Sexual Activity   Alcohol use: No    Alcohol/week: 0.0 standard drinks   Drug use: No   Sexual activity: Yes    Birth control/protection: None  Other Topics Concern   Not on file  Social History Narrative   Not on file   Social Determinants of Health   Financial Resource Strain: Low Risk    Difficulty of Paying Living Expenses: Not hard at all  Food Insecurity: No Food Insecurity   Worried About Charity fundraiser in the Last Year: Never true   [place] in the Last Year: Never true  Transportation Needs: No Transportation Needs   Lack of Transportation (Medical): No   Lack of Transportation (Non-Medical): No  Physical Activity: Sufficiently Active   Days of Exercise per Week: 5 days   Minutes of Exercise per Session: 60 min  Stress: No Stress Concern Present   Feeling of Stress : Not at all  Social Connections: Moderately Isolated   Frequency of Communication with Friends and Family:  More than three times a week   Frequency of Social Gatherings with Friends and Family: More than three times a week   Attends Religious Services: Never   Marine scientist or Organizations: No   Attends Music therapist: Never   Marital Status: Married  Human resources officer Violence: Not At Risk   Fear of Current or Ex-Partner: No   Emotionally Abused: No   Physically Abused: No   Sexually Abused: No    Outpatient Medications Prior to Visit  Medication Sig Dispense Refill   aspirin 81 MG tablet Take 81 mg by mouth daily.       atorvastatin (LIPITOR) 20 MG tablet Take 20 mg by mouth at bedtime.       carvedilol (COREG) 12.5 MG tablet Take 6.25 mg by mouth 2 (two) times daily with a meal.     Cholecalciferol (VITAMIN D3) 50 MCG (2000 UT) TABS Take 10,000 Units by mouth  daily.      FIBER PO Take 2 capsules by mouth daily as needed (constipation).     fluorometholone (FML) 0.1 % ophthalmic suspension SMARTSIG:1 Drop(s) In Eye(s) 2-4 Times Daily PRN     ondansetron (ZOFRAN) 4 MG tablet Take 4 mg by mouth every 8 (eight) hours as needed for nausea or vomiting.      pantoprazole (PROTONIX) 20 MG tablet Take 1 tablet (20 mg total) by mouth daily. 90 tablet 3   promethazine (PHENERGAN) 25 MG tablet Take 25 mg by mouth every 6 (six) hours as needed for nausea or vomiting.     SYNTHROID 50 MCG tablet Take 50 mcg by mouth daily.     traMADol (ULTRAM) 50 MG tablet Take 50 mg by mouth every 6 (six) hours as needed (pain).     nitroGLYCERIN (NITROSTAT) 0.4 MG SL tablet Place 1 tablet (0.4 mg total) under the tongue every 5 (five) minutes as needed for chest pain. 90 tablet 3   No facility-administered medications prior to visit.    Allergies  Allergen Reactions   Clarithromycin Anaphylaxis    REACTION: UNKNOWN REACTION   Codeine Nausea And Vomiting   Erythromycin Anaphylaxis   Sulfa Antibiotics Anaphylaxis   Trimethoprim     ROS Review of Systems  Constitutional:  Negative  for chills and fever.  HENT:  Negative for congestion and sore throat.   Eyes:  Negative for pain and discharge.  Respiratory:  Negative for cough and shortness of breath.   Cardiovascular:  Negative for chest pain and palpitations.  Gastrointestinal:  Negative for constipation, diarrhea, nausea and vomiting.  Endocrine: Negative for polydipsia and polyuria.  Genitourinary:  Negative for dysuria and hematuria.  Musculoskeletal:  Positive for arthralgias, back pain and gait problem. Negative for neck pain and neck stiffness.  Skin:  Negative for rash.  Neurological:  Negative for dizziness, weakness, numbness and headaches.  Psychiatric/Behavioral:  Negative for agitation and behavioral problems.      Objective:    Physical Exam Vitals reviewed.  Constitutional:      General: He is not in acute distress.    Appearance: He is not diaphoretic.  HENT:     Head: Normocephalic and atraumatic.     Nose: Nose normal.     Mouth/Throat:     Mouth: Mucous membranes are moist.  Eyes:     General: No scleral icterus.    Extraocular Movements: Extraocular movements intact.  Cardiovascular:     Rate and Rhythm: Normal rate and regular rhythm.     Pulses: Normal pulses.     Heart sounds: Normal heart sounds. No murmur heard. Pulmonary:     Breath sounds: Normal breath sounds. No wheezing or rales.  Abdominal:     Palpations: Abdomen is soft.     Tenderness: There is no abdominal tenderness.  Musculoskeletal:     Cervical back: Neck supple. No tenderness.     Right lower leg: No edema.     Left lower leg: No edema.  Skin:    General: Skin is warm and dry.  Neurological:     General: No focal deficit present.     Mental Status: He is alert and oriented to person, place, and time.     Sensory: Sensory deficit (B/l feet) present.     Motor: Weakness (B/l LE) present.     Gait: Gait abnormal.  Psychiatric:        Mood and Affect: Mood normal.  Behavior: Behavior normal.    BP  138/78 (BP Location: Left Arm, Patient Position: Sitting, Cuff Size: Normal)   Pulse 80   Resp 18   Ht 6' (1.829 m)   Wt 181 lb 1.3 oz (82.1 kg)   SpO2 98%   BMI 24.56 kg/m  Wt Readings from Last 3 Encounters:  05/21/21 181 lb 1.3 oz (82.1 kg)  01/15/21 175 lb 12.8 oz (79.7 kg)  12/12/20 178 lb (80.7 kg)    Lab Results  Component Value Date   TSH 1.770 01/16/2021   Lab Results  Component Value Date   WBC 5.3 03/02/2019   HGB 15.4 03/02/2019   HCT 48.8 03/02/2019   MCV 93.3 03/02/2019   PLT 146 (L) 03/02/2019   Lab Results  Component Value Date   NA 141 01/16/2021   K 4.6 01/16/2021   CO2 22 01/16/2021   GLUCOSE 110 (H) 01/16/2021   BUN 23 01/16/2021   CREATININE 1.05 01/16/2021   BILITOT 0.7 01/16/2021   ALKPHOS 54 01/16/2021   AST 19 01/16/2021   ALT 15 01/16/2021   PROT 6.3 01/16/2021   ALBUMIN 4.2 01/16/2021   CALCIUM 9.2 01/16/2021   ANIONGAP 6 03/02/2019   EGFR 70 01/16/2021   Lab Results  Component Value Date   CHOL 126 01/16/2021   Lab Results  Component Value Date   HDL 38 (L) 01/16/2021   Lab Results  Component Value Date   LDLCALC 76 01/16/2021   Lab Results  Component Value Date   TRIG 53 01/16/2021   Lab Results  Component Value Date   CHOLHDL 3.3 01/16/2021   Lab Results  Component Value Date   HGBA1C 5.7 (H) 03/02/2019      Assessment & Plan:   Problem List Items Addressed This Visit       Cardiovascular and Mediastinum   Hypertension    BP Readings from Last 1 Encounters:  05/21/21 138/78  Overall well-controlled with Coreg, considering his age 33 for compliance with the medications Advised DASH diet and moderate exercise/walking as tolerated        Digestive   Gastroesophageal reflux disease    On Pantoprazole        Endocrine   Hypothyroidism    On Levothyroxine 50 mcg QD Last TSH from [facility] wnl Check TSH and free T4      Relevant Orders   TSH + free T4     Nervous and Auditory    Idiopathic peripheral neuropathy - Primary    Started Gabapentin 100 mg qHS, plan to increase dose as tolerated Advised to take Vit B12 1000 mcg QD Going to see Neurology      Relevant Medications   gabapentin (NEURONTIN) 100 MG capsule   Other Relevant Orders   CMP14+EGFR   CBC with Differential/Platelet   Other Visit Diagnoses     Prediabetes       Relevant Orders   Hemoglobin A1c       Meds ordered this encounter  Medications   gabapentin (NEURONTIN) 100 MG capsule    Sig: Take 1 capsule (100 mg total) by mouth at bedtime.    Dispense:  90 capsule    Refill:  0    Follow-up: No follow-ups on file.    Lindell Spar, MD

## 2021-05-21 NOTE — Assessment & Plan Note (Signed)
BP Readings from Last 1 Encounters:  05/21/21 138/78   Overall well-controlled with Coreg, considering his age 85 for compliance with the medications Advised DASH diet and moderate exercise/walking as tolerated

## 2021-05-21 NOTE — Assessment & Plan Note (Signed)
On Pantoprazole 

## 2021-05-21 NOTE — Addendum Note (Signed)
Addended byIhor Dow on: 05/21/2021 02:15 PM   Modules accepted: Orders

## 2021-05-30 ENCOUNTER — Other Ambulatory Visit: Payer: Self-pay | Admitting: Internal Medicine

## 2021-05-30 DIAGNOSIS — G609 Hereditary and idiopathic neuropathy, unspecified: Secondary | ICD-10-CM

## 2021-05-30 MED ORDER — GABAPENTIN 300 MG PO CAPS: 300.0000 mg | ORAL_CAPSULE | Freq: Every day | ORAL | 1 refills | Status: DC

## 2021-08-01 ENCOUNTER — Ambulatory Visit: Payer: Medicare Other | Admitting: Neurology

## 2021-08-08 ENCOUNTER — Other Ambulatory Visit: Payer: Self-pay

## 2021-08-08 ENCOUNTER — Ambulatory Visit (INDEPENDENT_AMBULATORY_CARE_PROVIDER_SITE_OTHER): Payer: Medicare Other | Admitting: Neurology

## 2021-08-08 ENCOUNTER — Encounter: Payer: Self-pay | Admitting: Neurology

## 2021-08-08 ENCOUNTER — Other Ambulatory Visit (INDEPENDENT_AMBULATORY_CARE_PROVIDER_SITE_OTHER): Payer: Medicare Other

## 2021-08-08 VITALS — BP 143/78 | HR 79 | Resp 18 | Ht 72.0 in | Wt 180.0 lb

## 2021-08-08 DIAGNOSIS — R2681 Unsteadiness on feet: Secondary | ICD-10-CM | POA: Diagnosis not present

## 2021-08-08 DIAGNOSIS — G609 Hereditary and idiopathic neuropathy, unspecified: Secondary | ICD-10-CM | POA: Diagnosis not present

## 2021-08-08 LAB — B12 AND FOLATE PANEL
Folate: 16 ng/mL (ref >5.9)
Vitamin B-12: 898 pg/mL (ref 211–911)

## 2021-08-08 NOTE — Patient Instructions (Addendum)
Check labs  Start physical therapy for leg strengthening  Recommend that you use a cane at all times and also consider a walker, especially on uneven ground    Your provider has requested that you have labwork completed today. Please go to [facility] ([id number]) on the second floor of this building before leaving the office today. You do not need to check in. If you are not called within 15 minutes please check with the front desk.

## 2021-08-08 NOTE — Progress Notes (Signed)
[facility] Clinic Note - Initial Visit   Date: 08/08/21  Jorge Bishop MRN: [id number] DOB: 1935/08/13   Dear Dr. Kathyrn Sheriff:  Thank you for your kind referral of Jorge Bishop for consultation of bilateral leg weakness. Although his history is well known to you, please allow Jorge Bishop to reiterate it for the purpose of our medical record. The patient was accompanied to the clinic by self.    History of Present Illness: Jorge Bishop is a 86 y.o. right-handed male with CAD s/p CABG, and PCI, BPH, ED, GERD, hyperlipidemia, hypertension, hypothyroidism, s/p lumbar surgery, and prediabetes presenting for evaluation of bilateral leg weakness and neuropathy.   He has a long history of bilateral weakness, numbness, and tingling in the legs that started in early 1990s.  Symptoms have slowly been progressive.  He underwent lumbar surgery, but reports no change in symptoms.  He has been on epidural steroid injection for back pain in the past. He complains of inability of getting up from a squatting position or a low chair.  He also complains of tingling and numbness in the feet and legs up to the knees.  He has has numbness involving the fingertips.  Symptoms are constant, but some times he notices it less.  He was given gabapentin 300mg  TID, but stopped it due to nausea.  He has a prescription for gabapentin 100mg , which he has not started. He has imbalance, walks unassisted and has fallen three times in the past year.  He has completed PT for gait which provided short-term improvement, but nothing which has alleviated his symptoms.  Mother had neuropathy who developed it in her 67s.  No history chemotherapy exposure.    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  He lives with wife in a two level home.     Out-side paper records, electronic medical record, and images have been reviewed where available and summarized as:  MRI cervical spine wo contrast  01/26/2019: Cervical spine spondylosis most notable at C3-C4 with a central disc protrusion causing mild central canal stenosis and moderate to severe left neural foraminal narrowing.   MRI lumbar spine wo contrast 12/09/2018: 1. Mild lumbar spine spondylosis as described above without significant interval change compared with 10/04/2017.    Lab Results  Component Value Date   HGBA1C 5.7 (H) 03/02/2019   Lab Results  Component Value Date   TSH 1.770 01/16/2021    Past Medical History:  Diagnosis Date   Arteriosclerotic cardiovascular disease (ASCVD)    Coronary artery bypass graft surgery in 12/98; DES to the RCA SVG in 07/2002; restenosis of the saphenous vein graft stent and renal intervention in 2006; 70% first diagonal present; calf in 11/2007-total obstruction of the saphenous vein graft to the first marginal   Benign prostatic hypertrophy    Cerebrovascular disease    60% bilateral internal carotid artery stenosis in 2012   Coronary artery disease    4 stents   Diaphragmatic hernia 01/15/2021   Diverticulosis    DJD (degenerative joint disease)    Erectile dysfunction    GERD (gastroesophageal reflux disease)    History of blood transfusion    History of kidney stones    Hyperlipidemia    Lipid profile in 12/2009:104, 91, 36, 50   Hypertension    Kidney stone 09/08/2017   Migraine headache    Right bundle branch block    Right ureteral stone 03/08/2019   Shortness of breath  Thrombocytopenia (HCC)    platelets of 104 in 03/2009   Upper gastrointestinal hemorrhage 93/26    Helicobacter pylori positive; subsequent laparoscopic fundoplication-2009    Past Surgical History:  Procedure Laterality Date   CARDIAC CATHETERIZATION     4 cardiac stents   CATARACT EXTRACTION W/PHACO Right 07/25/2012   Procedure: CATARACT EXTRACTION PHACO AND INTRAOCULAR LENS PLACEMENT ([facility]);  Surgeon: Tonny Branch, MD;  Location: [facility];  Service: Ophthalmology;  Laterality: Right;  CDE:23.31    CATARACT EXTRACTION W/PHACO Left 08/18/2012   Procedure: CATARACT EXTRACTION PHACO AND INTRAOCULAR LENS PLACEMENT (I[facility]);  Surgeon: Tonny Branch, MD;  Location: [facility];  Service: Ophthalmology;  Laterality: Left;  CDE: 16.07   COLONOSCOPY N/A 08/01/2014   Procedure: COLONOSCOPY;  Surgeon: Rogene Houston, MD;  [facility];  Service: Endoscopy;  Laterality: N/A;  125 - moved to 10:30 - Ann to notify pt   CORONARY ARTERY BYPASS GRAFT  1998    CYSTOSCOPY/URETEROSCOPY/HOLMIUM LASER/STENT PLACEMENT Right 11/12/2017   Procedure: CYSTOSCOPY/RIGHT RETROGRADE PYELOGRAM/RIGHT URETEROSCOPY/HOLMIUM LASER LITHOTRIPSY RIGHT URETERAL CALCULUS/RIGHT URETERAL STENT PLACEMENT;  Surgeon: [name], MD;  Location: [facility];  Service: Urology;  Laterality: Right;  right ureteral calculus given to patient's wife per Dr. [name]   CYSTOSCOPY/URETEROSCOPY/HOLMIUM LASER/STENT PLACEMENT Right 03/07/2019   Procedure: CYSTOSCOPY RIGHT RETROGRADE RIGHT URETEROSCOPY;  Surgeon: [name], MD;  [facility]: [facility];  Service: Urology;  Laterality: Right;   EXTRACORPOREAL SHOCK WAVE LITHOTRIPSY Right 09/13/2017   Procedure: RIGHT EXTRACORPOREAL SHOCK WAVE LITHOTRIPSY (ESWL);  Surgeon: Lucas Mallow, MD;  [facility]: [facility];  Service: Urology;  Laterality: Right;   HERNIA REPAIR     right inguinal   LAPAROSCOPIC NISSEN FUNDOPLICATION  7124, 5809    revision in 2009 along with repair of paraesophagel hernia    PARAESOPHAGEAL HERNIA REPAIR     TRANSURETHRAL RESECTION OF PROSTATE N/A 03/07/2019   Procedure: TRANSURETHRAL RESECTION OF THE PROSTATE (TURP);  Surgeon: [name], MD;  [facility];  Service: Urology;  Laterality: N/A;     Medications:  Outpatient Encounter Medications as of 08/08/2021  Medication Sig   aspirin 81 MG tablet Take 81 mg by mouth daily.     atorvastatin (LIPITOR) 20 MG tablet Take 20 mg by mouth at bedtime.     carvedilol (COREG) 12.5 MG tablet Take 6.25 mg by mouth 2 (two) times daily with a  meal.   Cholecalciferol (VITAMIN D3) 50 MCG (2000 UT) TABS Take 10,000 Units by mouth daily.    cyanocobalamin 1000 MCG tablet Take 1,000 mcg by mouth daily.   FIBER PO Take 2 capsules by mouth daily as needed (constipation).   fluorometholone (FML) 0.1 % ophthalmic suspension SMARTSIG:1 Drop(s) In Eye(s) 2-4 Times Daily PRN   ondansetron (ZOFRAN) 4 MG tablet Take 4 mg by mouth every 8 (eight) hours as needed for nausea or vomiting.    pantoprazole (PROTONIX) 20 MG tablet Take 1 tablet (20 mg total) by mouth daily.   promethazine (PHENERGAN) 25 MG tablet Take 25 mg by mouth every 6 (six) hours as needed for nausea or vomiting.   SYNTHROID 50 MCG tablet Take 50 mcg by mouth daily.   gabapentin (NEURONTIN) 300 MG capsule Take 1 capsule (300 mg total) by mouth at bedtime. (Patient not taking: Reported on 08/08/2021)   nitroGLYCERIN (NITROSTAT) 0.4 MG SL tablet Place 1 tablet (0.4 mg total) under the tongue every 5 (five) minutes as needed for chest pain. (Patient not taking: Reported on 08/08/2021)   traMADol (  ULTRAM) 50 MG tablet Take 50 mg by mouth every 6 (six) hours as needed (pain).   No facility-administered encounter medications on file as of 08/08/2021.    Allergies:  Allergies  Allergen Reactions   Bactrim [Sulfamethoxazole-Trimethoprim] Anaphylaxis   Clarithromycin Anaphylaxis    REACTION: UNKNOWN REACTION   Codeine Nausea And Vomiting   Erythromycin Anaphylaxis   Sulfa Antibiotics Anaphylaxis   Trimethoprim     Family History: Family History  Problem Relation Age of Onset   CAD Father     Social History: Social History   Tobacco Use   Smoking status: Never   Smokeless tobacco: Never  Vaping Use   Vaping Use: Never used  Substance Use Topics   Alcohol use: No    Alcohol/week: 0.0 standard drinks   Drug use: No   Social History   Social History Narrative   Right handed   Drinks caffeine   Two story home    Vital Signs:  BP (!) 143/78    Pulse 79    Resp 18     Ht 6' (1.829 m)    Wt 180 lb (81.6 kg)    SpO2 99%    BMI 24.41 kg/m    Neurological Exam: MENTAL STATUS including orientation to time, place, person, recent and remote memory, attention span and concentration, language, and fund of knowledge is normal.  Speech is not dysarthric.  CRANIAL NERVES: II:  No visual field defects.    III-IV-VI: Pupils equal round and reactive to light.  Normal conjugate, extra-ocular eye movements in all directions of gaze.  No nystagmus.  No ptosis.   V:  Normal facial sensation.    VII:  Normal facial symmetry and movements.   VIII:  Normal hearing and vestibular function.   IX-X:  Normal palatal movement.   XI:  Normal shoulder shrug and head rotation.   XII:  Normal tongue strength and range of motion, no deviation or fasciculation.  MOTOR:  No atrophy, fasciculations or abnormal movements.  No pronator drift.   Upper Extremity:  Right  Left  Deltoid  5/5   5/5   Biceps  5/5   5/5   Triceps  5/5   5/5   Wrist extensors  5/5   5/5   Wrist flexors  5/5   5/5   Finger extensors  5/5   5/5   Finger flexors  5/5   5/5   Dorsal interossei  5/5   5/5   Abductor pollicis  5/5   5/5   Tone (Ashworth scale)  0  0   Lower Extremity:  Right  Left  Hip flexors  5/5   5/5   Hip extensors  5/5   5/5   Knee flexors  5/5   5/5   Knee extensors  5/5   5/5   Dorsiflexors  5/5   5/5   Plantarflexors  5/5   5/5   Toe extensors  5-/5   5-/5   Toe flexors  4/5   4/5   Tone (Ashworth scale)  0  0   MSRs:  Right        Left                  brachioradialis 2+  2+  biceps 2+  2+  triceps 2+  2+  patellar 1+  1+  ankle jerk 0  0  Hoffman no  no  plantar response down  down   SENSORY:  Vibration is  absent at the knees and ankles, intact at the MCP.  Temperature and pin prick reduced in a gradient pattern below the knees.  Rhomberg sign is present.  COORDINATION/GAIT: Normal finger-to- nose-finger. Intact rapid alternating movements bilaterally.  Unable to  rise from a chair without using arms.  Gait wide based, mild unsteadiness, unassisted.  Stressed gait intact.  Unable to perform tandem gait.   IMPRESSION: Longstanding history of peripheral neuropathy following a stocking-glove distribution, most likely idiopathic/age-related.  He does not have any risk factors for neuropathy.  His neurological examination shows a distal predominant large fiber peripheral neuropathy. I had extensive discussion with the patient regarding the pathogenesis, etiology, management, and natural course of neuropathy. Neuropathy tends to be slowly progressive, especially if a treatable etiology is not identified.  I would like to test for treatable causes of neuropathy. I discussed that in the vast majority of cases, despite checking for reversible causes, we are unable to find the underlying etiology and management is symptomatic.     PLAN/RECOMMENDATIONS:  Check vitamin B12, folate, copper, SPEP with IFE Refer to out-patient PT for leg strengthening NCS/EMG declined OK to start gabapentin 100mg  at bedtime for tingling, he was made aware that medications will not alleviate "numbness" or imbalance Strongly urged patient to use a cane at all times and a walker for uneven ground Fall precautions discussed  Return to clinic as needed  Total time spent reviewing records, interview, history/exam, documentation, and coordination of care on day of encounter:  60 min    Thank you for allowing me to participate in patient's care.  If I can answer any additional questions, I would be pleased to do so.    Sincerely,    Oswaldo Cueto K. Posey Pronto, DO

## 2021-08-12 LAB — PROTEIN ELECTROPHORESIS, SERUM
Abnormal Protein Band1: 0.6 g/dL — ABNORMAL HIGH
Albumin ELP: 4.2 g/dL (ref 3.8–4.8)
Alpha 1: 0.3 g/dL (ref 0.2–0.3)
Alpha 2: 0.8 g/dL (ref 0.5–0.9)
Beta 2: 0.4 g/dL (ref 0.2–0.5)
Beta Globulin: 0.4 g/dL (ref 0.4–0.6)
Gamma Globulin: 1.1 g/dL (ref 0.8–1.7)
Total Protein: 7.1 g/dL (ref 6.1–8.1)

## 2021-08-12 LAB — COPPER, SERUM: Copper: 90 ug/dL (ref 70–175)

## 2021-08-12 LAB — IMMUNOFIXATION ELECTROPHORESIS
IgG (Immunoglobin G), Serum: 1189 mg/dL (ref 600–1540)
IgM, Serum: 77 mg/dL (ref 50–300)
Immunofix Electr Int: DETECTED
Immunoglobulin A: 176 mg/dL (ref 70–320)

## 2021-08-13 ENCOUNTER — Telehealth: Payer: Self-pay

## 2021-08-13 DIAGNOSIS — D472 Monoclonal gammopathy: Secondary | ICD-10-CM

## 2021-08-13 NOTE — Telephone Encounter (Signed)
Referral sent to [facility] as requested my patient.  ?

## 2021-09-03 ENCOUNTER — Other Ambulatory Visit: Payer: Self-pay

## 2021-09-03 ENCOUNTER — Ambulatory Visit (HOSPITAL_COMMUNITY)
Admission: RE | Admit: 2021-09-03 | Discharge: 2021-09-03 | Disposition: A | Discharge status: Home / Self Care | Payer: Medicare Other | Source: Ambulatory Visit | Attending: Hematology | Admitting: Hematology

## 2021-09-03 ENCOUNTER — Inpatient Hospital Stay (HOSPITAL_COMMUNITY): Payer: Medicare Other | Attending: Hematology | Admitting: Hematology

## 2021-09-03 ENCOUNTER — Inpatient Hospital Stay (HOSPITAL_COMMUNITY): Payer: Medicare Other

## 2021-09-03 DIAGNOSIS — G629 Polyneuropathy, unspecified: Secondary | ICD-10-CM | POA: Insufficient documentation

## 2021-09-03 DIAGNOSIS — Z79899 Other long term (current) drug therapy: Secondary | ICD-10-CM | POA: Insufficient documentation

## 2021-09-03 DIAGNOSIS — R42 Dizziness and giddiness: Secondary | ICD-10-CM | POA: Diagnosis not present

## 2021-09-03 DIAGNOSIS — D472 Monoclonal gammopathy: Secondary | ICD-10-CM | POA: Insufficient documentation

## 2021-09-03 LAB — COMPREHENSIVE METABOLIC PANEL
ALT: 21 U/L (ref 0–44)
AST: 22 U/L (ref 15–41)
Albumin: 4.1 g/dL (ref 3.5–5.0)
Alkaline Phosphatase: 67 U/L (ref 38–126)
Anion gap: 9 (ref 5–15)
BUN: 27 mg/dL — ABNORMAL HIGH (ref 8–23)
CO2: 27 mmol/L (ref 22–32)
Calcium: 9.2 mg/dL (ref 8.9–10.3)
Chloride: 103 mmol/L (ref 98–111)
Creatinine, Ser: 1.04 mg/dL (ref 0.61–1.24)
GFR, Estimated: >60 mL/min (ref >60)
Glucose, Bld: 118 mg/dL — ABNORMAL HIGH (ref 70–99)
Potassium: 4.4 mmol/L (ref 3.5–5.1)
Sodium: 139 mmol/L (ref 135–145)
Total Bilirubin: 1 mg/dL (ref 0.3–1.2)
Total Protein: 7.2 g/dL (ref 6.5–8.1)

## 2021-09-03 LAB — CBC WITH DIFFERENTIAL/PLATELET
Abs Immature Granulocytes: 0.01 10*3/uL (ref 0.00–0.07)
Basophils Absolute: 0 10*3/uL (ref 0.0–0.1)
Basophils Relative: 1 %
Eosinophils Absolute: 0.1 10*3/uL (ref 0.0–0.5)
Eosinophils Relative: 1 %
HCT: 45.3 % (ref 39.0–52.0)
Hemoglobin: 14.5 g/dL (ref 13.0–17.0)
Immature Granulocytes: 0 %
Lymphocytes Relative: 22 %
Lymphs Abs: 1.6 10*3/uL (ref 0.7–4.0)
MCH: 30.2 pg (ref 26.0–34.0)
MCHC: 32 g/dL (ref 30.0–36.0)
MCV: 94.4 fL (ref 80.0–100.0)
Monocytes Absolute: 0.6 10*3/uL (ref 0.1–1.0)
Monocytes Relative: 9 %
Neutro Abs: 4.7 10*3/uL (ref 1.7–7.7)
Neutrophils Relative %: 67 %
Platelets: 151 10*3/uL (ref 150–400)
RBC: 4.8 MIL/uL (ref 4.22–5.81)
RDW: 13 % (ref 11.5–15.5)
WBC: 7 10*3/uL (ref 4.0–10.5)
nRBC: 0 % (ref 0.0–0.2)

## 2021-09-03 LAB — LACTATE DEHYDROGENASE: LDH: 178 U/L (ref 98–192)

## 2021-09-03 NOTE — Progress Notes (Signed)
? ?[facility] ?[address] ?[place], [place] [address] ? ? ?CLINIC:  ?Medical Oncology/Hematology ? ?Patient Care Team: ?Lindell Spar, MD as PCP - General (Internal Medicine) ?Derek Jack, MD as Medical Oncologist (Hematology) ? ?CHIEF COMPLAINTS/PURPOSE OF CONSULTATION:  ?Evaluation of MGUS ? ?HISTORY OF PRESENTING ILLNESS:  ?Jorge Bishop 86 y.o. male is here because of evaluation of MGUS, at the request of Dr. Posey Pronto. ? ?Today he reports feeling well. He has a history of neuropathy in his legs bilaterally over the past 4 years. He has history of vertigo which has caused 3 falls, and he takes Zofran and Phenergan prn for nausea. He reports easy bruising over the past 8 years on his hands and arms, and he denies easy bruising on his legs. He has lost 30 lbs over the past 2 years. He has a history of basal and squamous cell skin cancers. He denies history of CVA and MI. He has a history of kidney stones. He reports a history of thrombocytopenia.  ? ?He currently lives at home with his wife. Prior to retirement he was in the [employer]. He was exposed to agent orange in [place]. 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 He denies exposure to radiation. He denies smoking history. His mother had lung cancer, and he denies family history of myeloma or MGUS.  ? ?MEDICAL HISTORY:  ?Past Medical History:  ?Diagnosis Date  ? Arteriosclerotic cardiovascular disease (ASCVD)   ? Coronary artery bypass graft surgery in 12/98; DES to the RCA SVG in 07/2002; restenosis of the saphenous vein graft stent and renal intervention in 2006; 70% first diagonal present; calf in 11/2007-total obstruction of the saphenous vein graft to the first marginal  ? Benign prostatic hypertrophy   ? Cerebrovascular disease   ? 60% bilateral internal carotid artery stenosis in 2012  ? Coronary artery disease   ? 4 stents  ? Diaphragmatic hernia 01/15/2021  ? Diverticulosis   ? DJD  (degenerative joint disease)   ? Erectile dysfunction   ? GERD (gastroesophageal reflux disease)   ? History of blood transfusion   ? History of kidney stones   ? Hyperlipidemia   ? Lipid profile in 12/2009:104, 91, 36, 50  ? Hypertension   ? Kidney stone 09/08/2017  ? Migraine headache   ? Right bundle branch block   ? Right ureteral stone 03/08/2019  ? Shortness of breath   ? Thrombocytopenia ([place])   ? platelets of 104 in 03/2009  ? Upper gastrointestinal hemorrhage 10/09  ?  Helicobacter pylori positive; subsequent laparoscopic fundoplication-2009  ? ? ?SURGICAL HISTORY: ?Past Surgical History:  ?Procedure Laterality Date  ? CARDIAC CATHETERIZATION    ? 4 cardiac stents  ? CATARACT EXTRACTION W/PHACO Right 07/25/2012  ? Procedure: CATARACT EXTRACTION PHACO AND INTRAOCULAR LENS PLACEMENT ([facility]);  Surgeon: Tonny Branch, MD;  Location: [facility];  Service: Ophthalmology;  Laterality: Right;  CDE:23.31  ? CATARACT EXTRACTION W/PHACO Left 08/18/2012  ? Procedure: CATARACT EXTRACTION PHACO AND INTRAOCULAR LENS PLACEMENT (IOC);  Surgeon: Tonny Branch, MD;  Location: [facility];  Service: Ophthalmology;  Laterality: Left;  CDE: 16.07  ? COLONOSCOPY N/A 08/01/2014  ? Procedure: COLONOSCOPY;  Surgeon: Rogene Houston, MD;  [facility];  Service: Endoscopy;  Laterality: N/A;  125 - moved to 10:30 - Ann to notify pt  ? CORONARY ARTERY BYPASS GRAFT  1998   ? CYSTOSCOPY/URETEROSCOPY/HOLMIUM LASER/STENT PLACEMENT Right 11/12/2017  ?  Procedure: CYSTOSCOPY/RIGHT RETROGRADE PYELOGRAM/RIGHT URETEROSCOPY/HOLMIUM LASER LITHOTRIPSY RIGHT URETERAL CALCULUS/RIGHT URETERAL STENT PLACEMENT;  Surgeon: [name], MD;  Location: [facility];  Service: Urology;  Laterality: Right;  right ureteral calculus given to patient's wife per Dr. [name]  ? CYSTOSCOPY/URETEROSCOPY/HOLMIUM LASER/STENT PLACEMENT Right 03/07/2019  ? Procedure: CYSTOSCOPY RIGHT RETROGRADE RIGHT URETEROSCOPY;  Surgeon: [name], MD;  [facility];  Service: Urology;  Laterality:  Right;  ? EXTRACORPOREAL SHOCK WAVE LITHOTRIPSY Right 09/13/2017  ? Procedure: RIGHT EXTRACORPOREAL SHOCK WAVE LITHOTRIPSY (ESWL);  Surgeon: Lucas Mallow, MD;  [facility]: [facility];  Service: Urology;  Laterality: Right;  ? HERNIA REPAIR    ? right inguinal  ? LAPAROSCOPIC NISSEN FUNDOPLICATION  8841, 6606   ? revision in 2009 along with repair of paraesophagel hernia   ? PARAESOPHAGEAL HERNIA REPAIR    ? TRANSURETHRAL RESECTION OF PROSTATE N/A 03/07/2019  ? Procedure: TRANSURETHRAL RESECTION OF THE PROSTATE (TURP);  Surgeon: [name], MD;  [facility];  Service: Urology;  Laterality: N/A;  ? ? ?SOCIAL HISTORY: ?Social History  ? ?Socioeconomic History  ? Marital status: Married  ?  Spouse name: Not on file  ? Number of children: Not on file  ? Years of education: Not on file  ? Highest education level: Not on file  ?Occupational History  ? Occupation: Full time  ?  Employer: RETIRED  ?Tobacco Use  ? Smoking status: Never  ? Smokeless tobacco: Never  ?Vaping Use  ? Vaping Use: Never used  ?Substance and Sexual Activity  ? Alcohol use: No  ?  Alcohol/week: 0.0 standard drinks  ? Drug use: No  ? Sexual activity: Yes  ?  Birth control/protection: None  ?Other Topics Concern  ? Not on file  ?Social History Narrative  ? Right handed  ? Drinks caffeine  ? Two story home  ? ?Social Determinants of Health  ? ?Financial Resource Strain: Low Risk   ? Difficulty of Paying Living Expenses: Not hard at all  ?Food Insecurity: No Food Insecurity  ? Worried About Charity fundraiser in the Last Year: Never true  ? Ran Out of Food in the Last Year: Never true  ?Transportation Needs: No Transportation Needs  ? Lack of Transportation (Medical): No  ? Lack of Transportation (Non-Medical): No  ?Physical Activity: Sufficiently Active  ? Days of Exercise per Week: 5 days  ? Minutes of Exercise per Session: 60 min  ?Stress: No Stress Concern Present  ? Feeling of Stress : Not at all  ?Social Connections: Moderately Isolated  ?  Frequency of Communication with Friends and Family: More than three times a week  ? Frequency of Social Gatherings with Friends and Family: More than three times a week  ? Attends Religious Services: Never  ? Active Member of Clubs or Organizations: No  ? Attends Archivist Meetings: Never  ? Marital Status: Married  ?Intimate Partner Violence: Not At Risk  ? Fear of Current or Ex-Partner: No  ? Emotionally Abused: No  ? Physically Abused: No  ? Sexually Abused: No  ? ? ?FAMILY HISTORY: ?Family History  ?Problem Relation Age of Onset  ? CAD Father   ? ? ?ALLERGIES:  is allergic to bactrim [sulfamethoxazole-trimethoprim], clarithromycin, codeine, erythromycin, sulfa antibiotics, and trimethoprim. ? ?MEDICATIONS:  ?Current Outpatient Medications  ?Medication Sig Dispense Refill  ? aspirin 81 MG tablet Take 81 mg by mouth daily.      ? atorvastatin (LIPITOR) 20 MG tablet Take 20 mg by mouth  at bedtime.      ? carvedilol (COREG) 12.5 MG tablet Take 6.25 mg by mouth 2 (two) times daily with a meal.    ? Cholecalciferol (VITAMIN D3) 50 MCG (2000 UT) TABS Take 10,000 Units by mouth daily.     ? cyanocobalamin 1000 MCG tablet Take 1,000 mcg by mouth daily.    ? FIBER PO Take 2 capsules by mouth daily as needed (constipation).    ? fluorometholone (FML) 0.1 % ophthalmic suspension SMARTSIG:1 Drop(s) In Eye(s) 2-4 Times Daily PRN    ? ondansetron (ZOFRAN) 4 MG tablet Take 4 mg by mouth every 8 (eight) hours as needed for nausea or vomiting.     ? pantoprazole (PROTONIX) 20 MG tablet Take 1 tablet (20 mg total) by mouth daily. 90 tablet 3  ? promethazine (PHENERGAN) 25 MG tablet Take 25 mg by mouth every 6 (six) hours as needed for nausea or vomiting.    ? SYNTHROID 50 MCG tablet Take 50 mcg by mouth daily.    ? traMADol (ULTRAM) 50 MG tablet Take 50 mg by mouth every 6 (six) hours as needed (pain).    ? nitroGLYCERIN (NITROSTAT) 0.4 MG SL tablet Place 1 tablet (0.4 mg total) under the tongue every 5 (five) minutes  as needed for chest pain. (Patient not taking: Reported on 08/08/2021) 90 tablet 3  ? ?No current facility-administered medications for this visit.  ? ? ?REVIEW OF SYSTEMS:   ?Review of Systems  ?Constitutional:  Po

## 2021-09-03 NOTE — Patient Instructions (Addendum)
[place] at [facility] ?Discharge Instructions ? ?You were seen and examined today by Dr. Delton Coombes. Dr. Delton Coombes is a hematologist, meaning that he specializes in blood abnormalities. Dr. Delton Coombes discussed your past medical history, family history of cancers/blood conditions and the events that led to you being here today. ? ?You were referred to Dr. Delton Coombes by Dr. Posey Pronto due to an abnormal protein presence on your recent blood work. This is often associated with MGUS (Monoclonal Gammopathy of Unknown Significance) or Multiple Myeloma. MGUS is a non-cancerous condition that placed you at a higher risk for developing multiple myeloma, which is a blood cancer. MGUS and Myeloma are both associated with Agent Orange exposure. ? ?Dr. Delton Coombes has recommended additional lab work today. Dr. Delton Coombes has also recommended a skeletal survey. This is to ensure that the abnormal protein has not impacted your bones at all. ? ?Please follow-up at the [facility] as scheduled. ? ? ?Thank you for choosing [place] at [facility] to provide your oncology and hematology care.  To afford each patient quality time with our provider, please arrive at least 15 minutes before your scheduled appointment time.  ? ?If you have a lab appointment with the [facility] please come in thru the Main Entrance and check in at the main information desk. ? ?You need to re-schedule your appointment should you arrive 10 or more minutes late.  We strive to give you quality time with our providers, and arriving late affects you and other patients whose appointments are after yours.  Also, if you no show three or more times for appointments you may be dismissed from the clinic at the providers discretion.     ?Again, thank you for choosing [facility].  Our hope is that these requests will decrease the amount of time that you wait before being seen by our physicians.        ?_____________________________________________________________ ? ?Should you have questions after your visit to [facility], please contact our office at [phone] and follow the prompts.  Our office hours are 8:00 a.m. and 4:30 p.m. Monday - Friday.  Please note that voicemails left after 4:00 p.m. may not be returned until the following business day.  We are closed weekends and major holidays.  You do have access to a nurse 24-7, just call the main number to the clinic [phone] and do not press any options, hold on the line and a nurse will answer the phone.   ? ?For prescription refill requests, have your pharmacy contact our office and allow 72 hours.   ? ?Due to Covid, you will need to wear a mask upon entering the hospital. If you do not have a mask, a mask will be given to you at the Main Entrance upon arrival. For doctor visits, patients may have 1 support person age 3 or older with them. For treatment visits, patients can not have anyone with them due to social distancing guidelines and our immunocompromised population.  ? ? ? ?

## 2021-09-05 LAB — BETA 2 MICROGLOBULIN, SERUM: Beta-2 Microglobulin: 2.6 mg/L — ABNORMAL HIGH (ref 0.6–2.4)

## 2021-09-08 LAB — KAPPA/LAMBDA LIGHT CHAINS
Kappa free light chain: 39.9 mg/L — ABNORMAL HIGH (ref 3.3–19.4)
Kappa, lambda light chain ratio: 2.71 — ABNORMAL HIGH (ref 0.26–1.65)
Lambda free light chains: 14.7 mg/L (ref 5.7–26.3)

## 2021-09-09 LAB — IMMUNOFIXATION ELECTROPHORESIS
IgA: 180 mg/dL (ref 61–437)
IgG (Immunoglobin G), Serum: 1135 mg/dL (ref 603–1613)
IgM (Immunoglobulin M), Srm: 79 mg/dL (ref 15–143)
Total Protein ELP: 7 g/dL (ref 6.0–8.5)

## 2021-09-12 ENCOUNTER — Telehealth: Payer: Self-pay | Admitting: Internal Medicine

## 2021-09-12 NOTE — Telephone Encounter (Signed)
Patient needs refill on  ONDANSETRON TAB 4 MG  ?  ?Also wants frequency changed to 1 tab daily  ? ? ? ?[facility]  ? ? ?

## 2021-09-15 ENCOUNTER — Other Ambulatory Visit: Payer: Self-pay | Admitting: Internal Medicine

## 2021-09-15 ENCOUNTER — Telehealth: Payer: Self-pay | Admitting: Student

## 2021-09-15 DIAGNOSIS — R110 Nausea: Secondary | ICD-10-CM

## 2021-09-15 MED ORDER — ONDANSETRON HCL 4 MG PO TABS: 4.0000 mg | ORAL_TABLET | Freq: Three times a day (TID) | ORAL | 1 refills | Status: DC | PRN

## 2021-09-15 NOTE — Telephone Encounter (Signed)
[name] came by just wanted to let you know that he has recent labs, xrays regarding his bone marrow.  He thought you would want to know  ?

## 2021-09-16 LAB — CMP14+EGFR
ALT: 18 IU/L (ref 0–44)
AST: 20 IU/L (ref 0–40)
Albumin/Globulin Ratio: 1.9 (ref 1.2–2.2)
Albumin: 4.4 g/dL (ref 3.6–4.6)
Alkaline Phosphatase: 83 IU/L (ref 44–121)
BUN/Creatinine Ratio: 18 (ref 10–24)
BUN: 18 mg/dL (ref 8–27)
Bilirubin Total: 0.8 mg/dL (ref 0.0–1.2)
CO2: 25 mmol/L (ref 20–29)
Calcium: 9.6 mg/dL (ref 8.6–10.2)
Chloride: 104 mmol/L (ref 96–106)
Creatinine, Ser: 1.01 mg/dL (ref 0.76–1.27)
Globulin, Total: 2.3 g/dL (ref 1.5–4.5)
Glucose: 111 mg/dL — ABNORMAL HIGH (ref 70–99)
Potassium: 5.4 mmol/L — ABNORMAL HIGH (ref 3.5–5.2)
Sodium: 141 mmol/L (ref 134–144)
Total Protein: 6.7 g/dL (ref 6.0–8.5)
eGFR: 73 mL/min/{1.73_m2} (ref >59)

## 2021-09-16 LAB — CBC WITH DIFFERENTIAL/PLATELET
Basophils Absolute: 0 10*3/uL (ref 0.0–0.2)
Basos: 0 %
EOS (ABSOLUTE): 0.2 10*3/uL (ref 0.0–0.4)
Eos: 2 %
Hematocrit: 46.4 % (ref 37.5–51.0)
Hemoglobin: 15.2 g/dL (ref 13.0–17.7)
Immature Grans (Abs): 0 10*3/uL (ref 0.0–0.1)
Immature Granulocytes: 0 %
Lymphocytes Absolute: 1.5 10*3/uL (ref 0.7–3.1)
Lymphs: 22 %
MCH: 29.2 pg (ref 26.6–33.0)
MCHC: 32.8 g/dL (ref 31.5–35.7)
MCV: 89 fL (ref 79–97)
Monocytes Absolute: 0.6 10*3/uL (ref 0.1–0.9)
Monocytes: 9 %
Neutrophils Absolute: 4.7 10*3/uL (ref 1.4–7.0)
Neutrophils: 67 %
Platelets: 139 10*3/uL — ABNORMAL LOW (ref 150–450)
RBC: 5.21 x10E6/uL (ref 4.14–5.80)
RDW: 12.4 % (ref 11.6–15.4)
WBC: 7 10*3/uL (ref 3.4–10.8)

## 2021-09-16 LAB — HEMOGLOBIN A1C
Est. average glucose Bld gHb Est-mCnc: 123 mg/dL
Hgb A1c MFr Bld: 5.9 % — ABNORMAL HIGH (ref 4.8–5.6)

## 2021-09-16 LAB — TSH+FREE T4
Free T4: 1.46 ng/dL (ref 0.82–1.77)
TSH: 3.05 u[IU]/mL (ref 0.450–4.500)

## 2021-09-17 NOTE — Telephone Encounter (Signed)
? ?  Please let the patient know I reviewed labs from his PCP and his hemoglobin is within a normal range at 15.2 and platelet count is stable at 139 K. Kidney function and liver function are within a normal range but his potassium is slightly elevated at 5.4. I suspect they will be calling him with the results as well but he is not listed as being on potassium segmentation but would confirm this. Would recommend limiting his intake of potassium rich foods such as bananas, tomatoes and leafy greens. ? ?Signed, ?Erma Heritage, PA-C ?09/17/2021, 10:12 AM ?Pager: [phone] ? ?

## 2021-09-18 NOTE — Telephone Encounter (Signed)
Spoke with pt and reviewed lab results. Pt states that he wanted the labs reviewed that Dr. Delton Coombes ordered. He wants to know how this impacts his heart. Pt states that he will see Dr. Delton Coombes on tomorrow and ask that he seen results to [place], PA-C.  ?

## 2021-09-23 ENCOUNTER — Inpatient Hospital Stay (HOSPITAL_COMMUNITY): Payer: Medicare Other | Attending: Hematology | Admitting: Hematology

## 2021-09-23 VITALS — BP 143/78 | HR 73 | Temp 97.1°F | Resp 18 | Ht 72.0 in | Wt 182.3 lb

## 2021-09-23 DIAGNOSIS — D472 Monoclonal gammopathy: Secondary | ICD-10-CM | POA: Insufficient documentation

## 2021-09-23 DIAGNOSIS — Z79899 Other long term (current) drug therapy: Secondary | ICD-10-CM | POA: Diagnosis not present

## 2021-09-23 DIAGNOSIS — G629 Polyneuropathy, unspecified: Secondary | ICD-10-CM | POA: Diagnosis not present

## 2021-09-23 MED ORDER — GABAPENTIN 100 MG PO CAPS: 100.0000 mg | ORAL_CAPSULE | Freq: Three times a day (TID) | ORAL | 5 refills | Status: DC

## 2021-09-23 NOTE — Patient Instructions (Signed)
[place] at [facility] ?Discharge Instructions ? ?You were seen and examined today by Dr. Delton Coombes. He reviewed your most recent labs and the light chains are slightly high. 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 We will keep a close follow up on your labs and also get a bone scan once a year to see if it is changing over to smoldering myeloma. Please keep follow up appointments as scheduled in 4 months. ? ? ?Thank you for choosing [place] at [facility] to provide your oncology and hematology care.  To afford each patient quality time with our provider, please arrive at least 15 minutes before your scheduled appointment time.  ? ?If you have a lab appointment with the [facility] please come in thru the Main Entrance and check in at the main information desk. ? ?You need to re-schedule your appointment should you arrive 10 or more minutes late.  We strive to give you quality time with our providers, and arriving late affects you and other patients whose appointments are after yours.  Also, if you no show three or more times for appointments you may be dismissed from the clinic at the providers discretion.     ?Again, thank you for choosing [facility].  Our hope is that these requests will decrease the amount of time that you wait before being seen by our physicians.       ?_____________________________________________________________ ? ?Should you have questions after your visit to [facility], please contact our office at [phone] and follow the prompts.  Our office hours are 8:00 a.m. and 4:30 p.m. Monday - Friday.  Please note that voicemails left after 4:00 p.m. may not be returned until the following business day.  We are closed weekends and major holidays.  You do have access to a nurse 24-7, just call the main number to the clinic [phone] and do not press any options, hold on the line and a nurse will answer the  phone.   ? ?For prescription refill requests, have your pharmacy contact our office and allow 72 hours.   ? ?Due to Covid, you will need to wear a mask upon entering the hospital. If you do not have a mask, a mask will be given to you at the Main Entrance upon arrival. For doctor visits, patients may have 1 support person age 48 or older with them. For treatment visits, patients can not have anyone with them due to social distancing guidelines and our immunocompromised population.  ? ?  ?

## 2021-09-23 NOTE — Progress Notes (Signed)
? ?Jorge Bishop ?[address] ?[place], [place] [address] ? ? ?CLINIC:  ?Medical Oncology/Hematology ? ?PCP:  ?Lindell Spar, MD ?[address] / [place] [address]  ?[phone] ? ?REASON FOR VISIT:  ?Follow-up for MGUS ? ?PRIOR THERAPY: none ? ?CURRENT THERAPY: under work-up ? ?INTERVAL HISTORY:  ?Jorge Bishop, a 86 y.o. male, returns for routine follow-up for his MGUS. Jorge Bishop was last seen on 09/03/2021. ? ?Today he reports feeling good. He was taking 300 mg Gabapentin TID for his neuropathy, but he was not able to tolerate it as it caused him to be off balance. He continues to have tingling in his feet for which he is walking with a cane to prevent falls.  ? ?REVIEW OF SYSTEMS:  ?Review of Systems  ?Constitutional:  Negative for appetite change and fatigue.  ?HENT:   Positive for trouble swallowing.   ?Respiratory:  Positive for shortness of breath.   ?Gastrointestinal:  Positive for constipation and vomiting.  ?Musculoskeletal:  Positive for arthralgias (4/10).  ?Neurological:  Positive for dizziness and numbness.  ?All other systems reviewed and are negative. ? ?PAST MEDICAL/SURGICAL HISTORY:  ?Past Medical History:  ?Diagnosis Date  ? Arteriosclerotic cardiovascular disease (ASCVD)   ? Coronary artery bypass graft surgery in 12/98; DES to the RCA SVG in 07/2002; restenosis of the saphenous vein graft stent and renal intervention in 2006; 70% first diagonal present; calf in 11/2007-total obstruction of the saphenous vein graft to the first marginal  ? Benign prostatic hypertrophy   ? Cerebrovascular disease   ? 60% bilateral internal carotid artery stenosis in 2012  ? Coronary artery disease   ? 4 stents  ? Diaphragmatic hernia 01/15/2021  ? Diverticulosis   ? DJD (degenerative joint disease)   ? Erectile dysfunction   ? GERD (gastroesophageal reflux disease)   ? History of blood transfusion   ? History of kidney stones   ? Hyperlipidemia   ? Lipid profile in 12/2009:104, 91, 36, 50  ?  Hypertension   ? Kidney stone 09/08/2017  ? Migraine headache   ? Right bundle branch block   ? Right ureteral stone 03/08/2019  ? Shortness of breath   ? Thrombocytopenia ([place])   ? platelets of 104 in 03/2009  ? Upper gastrointestinal hemorrhage 10/09  ?  Helicobacter pylori positive; subsequent laparoscopic fundoplication-2009  ? ?Past Surgical History:  ?Procedure Laterality Date  ? CARDIAC CATHETERIZATION    ? 4 cardiac stents  ? CATARACT EXTRACTION W/PHACO Right 07/25/2012  ? Procedure: CATARACT EXTRACTION PHACO AND INTRAOCULAR LENS PLACEMENT (IOC);  Surgeon: Tonny Branch, MD;  [facility];  Service: Ophthalmology;  Laterality: Right;  CDE:23.31  ? CATARACT EXTRACTION W/PHACO Left 08/18/2012  ? Procedure: CATARACT EXTRACTION PHACO AND INTRAOCULAR LENS PLACEMENT (IOC);  Surgeon: Tonny Branch, MD;  Location: [facility];  Service: Ophthalmology;  Laterality: Left;  CDE: 16.07  ? COLONOSCOPY N/A 08/01/2014  ? Procedure: COLONOSCOPY;  Surgeon: Rogene Houston, MD;  [facility];  Service: Endoscopy;  Laterality: N/A;  125 - moved to 10:30 - Ann to notify pt  ? CORONARY ARTERY BYPASS GRAFT  1998   ? CYSTOSCOPY/URETEROSCOPY/HOLMIUM LASER/STENT PLACEMENT Right 11/12/2017  ? Procedure: CYSTOSCOPY/RIGHT RETROGRADE PYELOGRAM/RIGHT URETEROSCOPY/HOLMIUM LASER LITHOTRIPSY RIGHT URETERAL CALCULUS/RIGHT URETERAL STENT PLACEMENT;  Surgeon: [name], MD;  Location: [facility];  Service: Urology;  Laterality: Right;  right ureteral calculus given to patient's wife per Dr. [name]  ? CYSTOSCOPY/URETEROSCOPY/HOLMIUM LASER/STENT PLACEMENT Right 03/07/2019  ? Procedure:  CYSTOSCOPY RIGHT RETROGRADE RIGHT URETEROSCOPY;  Surgeon: [name], MD;  [facility]: [facility];  Service: Urology;  Laterality: Right;  ? EXTRACORPOREAL SHOCK WAVE LITHOTRIPSY Right 09/13/2017  ? Procedure: RIGHT EXTRACORPOREAL SHOCK WAVE LITHOTRIPSY (ESWL);  Surgeon: Lucas Mallow, MD;  [facility]: [facility];  Service: Urology;  Laterality: Right;  ? HERNIA REPAIR    ?  right inguinal  ? LAPAROSCOPIC NISSEN FUNDOPLICATION  5993, 5701   ? revision in 2009 along with repair of paraesophagel hernia   ? PARAESOPHAGEAL HERNIA REPAIR    ? TRANSURETHRAL RESECTION OF PROSTATE N/A 03/07/2019  ? Procedure: TRANSURETHRAL RESECTION OF THE PROSTATE (TURP);  Surgeon: [name], MD;  [facility]: [facility];  Service: Urology;  Laterality: N/A;  ? ? ?SOCIAL HISTORY:  ?Social History  ? ?Socioeconomic History  ? Marital status: Married  ?  Spouse name: Not on file  ? Number of children: Not on file  ? Years of education: Not on file  ? Highest education level: Not on file  ?Occupational History  ? Occupation: Full time  ?  Employer: RETIRED  ?Tobacco Use  ? Smoking status: Never  ? Smokeless tobacco: Never  ?Vaping Use  ? Vaping Use: Never used  ?Substance and Sexual Activity  ? Alcohol use: No  ?  Alcohol/week: 0.0 standard drinks  ? Drug use: No  ? Sexual activity: Yes  ?  Birth control/protection: None  ?Other Topics Concern  ? Not on file  ?Social History Narrative  ? Right handed  ? Drinks caffeine  ? Two story home  ? ?Social Determinants of Health  ? ?Financial Resource Strain: Low Risk   ? Difficulty of Paying Living Expenses: Not hard at all  ?Food Insecurity: No Food Insecurity  ? Worried About Charity fundraiser in the Last Year: Never true  ? Ran Out of Food in the Last Year: Never true  ?Transportation Needs: No Transportation Needs  ? Lack of Transportation (Medical): No  ? Lack of Transportation (Non-Medical): No  ?Physical Activity: Sufficiently Active  ? Days of Exercise per Week: 5 days  ? Minutes of Exercise per Session: 60 min  ?Stress: No Stress Concern Present  ? Feeling of Stress : Not at all  ?Social Connections: Moderately Isolated  ? Frequency of Communication with Friends and Family: More than three times a week  ? Frequency of Social Gatherings with Friends and Family: More than three times a week  ? Attends Religious Services: Never  ? Active Member of Clubs or  Organizations: No  ? Attends Archivist Meetings: Never  ? Marital Status: Married  ?Intimate Partner Violence: Not At Risk  ? Fear of Current or Ex-Partner: No  ? Emotionally Abused: No  ? Physically Abused: No  ? Sexually Abused: No  ? ? ?FAMILY HISTORY:  ?Family History  ?Problem Relation Age of Onset  ? CAD Father   ? ? ?CURRENT MEDICATIONS:  ?Current Outpatient Medications  ?Medication Sig Dispense Refill  ? aspirin 81 MG tablet Take 81 mg by mouth daily.      ? atorvastatin (LIPITOR) 20 MG tablet Take 20 mg by mouth at bedtime.      ? carvedilol (COREG) 12.5 MG tablet Take 6.25 mg by mouth 2 (two) times daily with a meal.    ? Cholecalciferol (VITAMIN D3) 50 MCG (2000 UT) TABS Take 10,000 Units by mouth daily.     ? cyanocobalamin 1000 MCG tablet Take 1,000 mcg by mouth daily.    ?  FIBER PO Take 2 capsules by mouth daily as needed (constipation).    ? fluorometholone (FML) 0.1 % ophthalmic suspension SMARTSIG:1 Drop(s) In Eye(s) 2-4 Times Daily PRN    ? gabapentin (NEURONTIN) 100 MG capsule Take 1 capsule (100 mg total) by mouth 3 (three) times daily. 90 capsule 5  ? ondansetron (ZOFRAN) 4 MG tablet Take 1 tablet (4 mg total) by mouth every 8 (eight) hours as needed for nausea or vomiting. 30 tablet 1  ? pantoprazole (PROTONIX) 20 MG tablet Take 1 tablet (20 mg total) by mouth daily. 90 tablet 3  ? promethazine (PHENERGAN) 25 MG tablet Take 25 mg by mouth every 6 (six) hours as needed for nausea or vomiting.    ? SYNTHROID 50 MCG tablet Take 50 mcg by mouth daily.    ? traMADol (ULTRAM) 50 MG tablet Take 50 mg by mouth every 6 (six) hours as needed (pain).    ? nitroGLYCERIN (NITROSTAT) 0.4 MG SL tablet Place 1 tablet (0.4 mg total) under the tongue every 5 (five) minutes as needed for chest pain. (Patient not taking: Reported on 08/08/2021) 90 tablet 3  ? ?No current facility-administered medications for this visit.  ? ? ?ALLERGIES:  ?Allergies  ?Allergen Reactions  ? Bactrim  [Sulfamethoxazole-Trimethoprim] Anaphylaxis  ? Clarithromycin Anaphylaxis  ?  REACTION: UNKNOWN REACTION  ? Codeine Nausea And Vomiting  ? Erythromycin Anaphylaxis  ? Sulfa Antibiotics Anaphylaxis  ? Trimethoprim   ? ? ?PHYSICAL EXAM:  ?Performanc

## 2021-09-25 ENCOUNTER — Encounter: Payer: Self-pay | Admitting: Internal Medicine

## 2021-09-25 ENCOUNTER — Ambulatory Visit (INDEPENDENT_AMBULATORY_CARE_PROVIDER_SITE_OTHER): Payer: Medicare Other | Admitting: Internal Medicine

## 2021-09-25 VITALS — BP 132/68 | HR 76 | Ht 72.0 in | Wt 181.2 lb

## 2021-09-25 DIAGNOSIS — D472 Monoclonal gammopathy: Secondary | ICD-10-CM | POA: Diagnosis not present

## 2021-09-25 DIAGNOSIS — E039 Hypothyroidism, unspecified: Secondary | ICD-10-CM | POA: Diagnosis not present

## 2021-09-25 DIAGNOSIS — I2510 Atherosclerotic heart disease of native coronary artery without angina pectoris: Secondary | ICD-10-CM | POA: Diagnosis not present

## 2021-09-25 DIAGNOSIS — D692 Other nonthrombocytopenic purpura: Secondary | ICD-10-CM

## 2021-09-25 DIAGNOSIS — R110 Nausea: Secondary | ICD-10-CM

## 2021-09-25 DIAGNOSIS — G609 Hereditary and idiopathic neuropathy, unspecified: Secondary | ICD-10-CM

## 2021-09-25 MED ORDER — ONDANSETRON HCL 4 MG PO TABS: 4.0000 mg | ORAL_TABLET | Freq: Three times a day (TID) | ORAL | 1 refills | Status: DC | PRN

## 2021-09-25 NOTE — Progress Notes (Signed)
? ?Established Patient Office Visit ? ?Subjective:  ?Patient ID: Jorge Bishop, male    DOB: Aug 28, 1935  Age: 86 y.o. MRN: [id number] ? ?CC:  ?Chief Complaint  ?Patient presents with  ? Follow-up  ?  4 month follow up discuss gabapentin and blood work, recently diagnosed with cancer   ? ? ?HPI ?Jorge Bishop is a 86 y.o. male with past medical history of CAD s/p CABG, hypertension, HLD, prediabetes, hypothyroidism, MGUS, GERD, lumbar spinal stenosis s/p lumbar laminectomy and BPH who presents for f/u of his chronic medical conditions. ?  ?CAD s/p CABG and hypertension: BP is well-controlled. Takes medications regularly. Patient denies headache, dizziness, chest pain, dyspnea or palpitations.  He follows up with cardiology.  He is on Coreg, aspirin and atorvastatin. ? ?MGUS: He was recently diagnosed with MGUS, and had A skeletal survey done, which was benign.  Followed by oncology currently. ? ?Neuropathy: He was started on gabapentin, but he felt drowsiness and has stopped taking it.  He agrees to start taking it nightly and uptitrate it as tolerated. ? ?Hypothyroidism: Takes levothyroxine.  Denies any recent change in weight or appetite. ? ? ? ? ? ?Past Medical History:  ?Diagnosis Date  ? Arteriosclerotic cardiovascular disease (ASCVD)   ? Coronary artery bypass graft surgery in 12/98; DES to the RCA SVG in 07/2002; restenosis of the saphenous vein graft stent and renal intervention in 2006; 70% first diagonal present; calf in 11/2007-total obstruction of the saphenous vein graft to the first marginal  ? Benign prostatic hypertrophy   ? Cerebrovascular disease   ? 60% bilateral internal carotid artery stenosis in 2012  ? Coronary artery disease   ? 4 stents  ? Diaphragmatic hernia 01/15/2021  ? Diverticulosis   ? DJD (degenerative joint disease)   ? Erectile dysfunction   ? GERD (gastroesophageal reflux disease)   ? History of blood transfusion   ? History of kidney stones   ? Hyperlipidemia   ? Lipid profile in  12/2009:104, 91, 36, 50  ? Hypertension   ? Kidney stone 09/08/2017  ? Migraine headache   ? Right bundle branch block   ? Right ureteral stone 03/08/2019  ? Shortness of breath   ? Thrombocytopenia ([place])   ? platelets of 104 in 03/2009  ? Upper gastrointestinal hemorrhage 10/09  ?  Helicobacter pylori positive; subsequent laparoscopic fundoplication-2009  ? ? ?Past Surgical History:  ?Procedure Laterality Date  ? CARDIAC CATHETERIZATION    ? 4 cardiac stents  ? CATARACT EXTRACTION W/PHACO Right 07/25/2012  ? Procedure: CATARACT EXTRACTION PHACO AND INTRAOCULAR LENS PLACEMENT (IOC);  Surgeon: Tonny Branch, MD;  Location: [facility];  Service: Ophthalmology;  Laterality: Right;  CDE:23.31  ? CATARACT EXTRACTION W/PHACO Left 08/18/2012  ? Procedure: CATARACT EXTRACTION PHACO AND INTRAOCULAR LENS PLACEMENT (IOC);  Surgeon: Tonny Branch, MD;  [facility]: [facility];  Service: Ophthalmology;  Laterality: Left;  CDE: 16.07  ? COLONOSCOPY N/A 08/01/2014  ? Procedure: COLONOSCOPY;  Surgeon: Rogene Houston, MD;  [facility];  Service: Endoscopy;  Laterality: N/A;  125 - moved to 10:30 - Ann to notify pt  ? CORONARY ARTERY BYPASS GRAFT  1998   ? CYSTOSCOPY/URETEROSCOPY/HOLMIUM LASER/STENT PLACEMENT Right 11/12/2017  ? Procedure: CYSTOSCOPY/RIGHT RETROGRADE PYELOGRAM/RIGHT URETEROSCOPY/HOLMIUM LASER LITHOTRIPSY RIGHT URETERAL CALCULUS/RIGHT URETERAL STENT PLACEMENT;  Surgeon: [name], MD;  Location: [facility];  Service: Urology;  Laterality: Right;  right ureteral calculus given to patient's wife per Dr. [name]  ? CYSTOSCOPY/URETEROSCOPY/HOLMIUM LASER/STENT PLACEMENT Right 03/07/2019  ?  Procedure: CYSTOSCOPY RIGHT RETROGRADE RIGHT URETEROSCOPY;  Surgeon: [name], MD;  [facility];  Service: Urology;  Laterality: Right;  ? EXTRACORPOREAL SHOCK WAVE LITHOTRIPSY Right 09/13/2017  ? Procedure: RIGHT EXTRACORPOREAL SHOCK WAVE LITHOTRIPSY (ESWL);  Surgeon: Lucas Mallow, MD;  [facility];  Service: Urology;  Laterality:  Right;  ? HERNIA REPAIR    ? right inguinal  ? LAPAROSCOPIC NISSEN FUNDOPLICATION  3893, 7342   ? revision in 2009 along with repair of paraesophagel hernia   ? PARAESOPHAGEAL HERNIA REPAIR    ? TRANSURETHRAL RESECTION OF PROSTATE N/A 03/07/2019  ? Procedure: TRANSURETHRAL RESECTION OF THE PROSTATE (TURP);  Surgeon: [name], MD;  [facility];  Service: Urology;  Laterality: N/A;  ? ? ?Family History  ?Problem Relation Age of Onset  ? CAD Father   ? ? ?Social History  ? ?Socioeconomic History  ? Marital status: Married  ?  Spouse name: Not on file  ? Number of children: Not on file  ? Years of education: Not on file  ? Highest education level: Not on file  ?Occupational History  ? Occupation: Full time  ?  Employer: RETIRED  ?Tobacco Use  ? Smoking status: Never  ? Smokeless tobacco: Never  ?Vaping Use  ? Vaping Use: Never used  ?Substance and Sexual Activity  ? Alcohol use: No  ?  Alcohol/week: 0.0 standard drinks  ? Drug use: No  ? Sexual activity: Yes  ?  Birth control/protection: None  ?Other Topics Concern  ? Not on file  ?Social History Narrative  ? Right handed  ? Drinks caffeine  ? Two story home  ? ?Social Determinants of Health  ? ?Financial Resource Strain: Low Risk   ? Difficulty of Paying Living Expenses: Not hard at all  ?Food Insecurity: No Food Insecurity  ? Worried About Charity fundraiser in the Last Year: Never true  ? Ran Out of Food in the Last Year: Never true  ?Transportation Needs: No Transportation Needs  ? Lack of Transportation (Medical): No  ? Lack of Transportation (Non-Medical): No  ?Physical Activity: Sufficiently Active  ? Days of Exercise per Week: 5 days  ? Minutes of Exercise per Session: 60 min  ?Stress: No Stress Concern Present  ? Feeling of Stress : Not at all  ?Social Connections: Moderately Isolated  ? Frequency of Communication with Friends and Family: More than three times a week  ? Frequency of Social Gatherings with Friends and Family: More than three times a week   ? Attends Religious Services: Never  ? Active Member of Clubs or Organizations: No  ? Attends Archivist Meetings: Never  ? Marital Status: Married  ?Intimate Partner Violence: Not At Risk  ? Fear of Current or Ex-Partner: No  ? Emotionally Abused: No  ? Physically Abused: No  ? Sexually Abused: No  ? ? ?Outpatient Medications Prior to Visit  ?Medication Sig Dispense Refill  ? aspirin 81 MG tablet Take 81 mg by mouth daily.      ? atorvastatin (LIPITOR) 20 MG tablet Take 20 mg by mouth at bedtime.      ? carvedilol (COREG) 12.5 MG tablet Take 6.25 mg by mouth 2 (two) times daily with a meal.    ? Cholecalciferol (VITAMIN D3) 50 MCG (2000 UT) TABS Take 10,000 Units by mouth daily.     ? cyanocobalamin 1000 MCG tablet Take 1,000 mcg by mouth daily.    ? FIBER PO Take 2 capsules  by mouth daily as needed (constipation).    ? fluorometholone (FML) 0.1 % ophthalmic suspension SMARTSIG:1 Drop(s) In Eye(s) 2-4 Times Daily PRN    ? gabapentin (NEURONTIN) 100 MG capsule Take 1 capsule (100 mg total) by mouth 3 (three) times daily. 90 capsule 5  ? pantoprazole (PROTONIX) 20 MG tablet Take 1 tablet (20 mg total) by mouth daily. 90 tablet 3  ? promethazine (PHENERGAN) 25 MG tablet Take 25 mg by mouth every 6 (six) hours as needed for nausea or vomiting.    ? SYNTHROID 50 MCG tablet Take 50 mcg by mouth daily.    ? traMADol (ULTRAM) 50 MG tablet Take 50 mg by mouth every 6 (six) hours as needed (pain).    ? ondansetron (ZOFRAN) 4 MG tablet Take 1 tablet (4 mg total) by mouth every 8 (eight) hours as needed for nausea or vomiting. 30 tablet 1  ? nitroGLYCERIN (NITROSTAT) 0.4 MG SL tablet Place 1 tablet (0.4 mg total) under the tongue every 5 (five) minutes as needed for chest pain. (Patient not taking: Reported on 08/08/2021) 90 tablet 3  ? ?No facility-administered medications prior to visit.  ? ? ?Allergies  ?Allergen Reactions  ? Bactrim [Sulfamethoxazole-Trimethoprim] Anaphylaxis  ? Clarithromycin Anaphylaxis  ?   REACTION: UNKNOWN REACTION  ? Codeine Nausea And Vomiting  ? Erythromycin Anaphylaxis  ? Sulfa Antibiotics Anaphylaxis  ? Trimethoprim   ? ? ?ROS ?Review of Systems  ?Constitutional:  Negative for chills and feve

## 2021-09-25 NOTE — Assessment & Plan Note (Signed)
Followed by oncology 

## 2021-09-25 NOTE — Assessment & Plan Note (Signed)
Likely due to use of aspirin, reassured it being benign ?

## 2021-09-25 NOTE — Assessment & Plan Note (Signed)
CABG in 12/98; DES to the RCA SVG in 07/2002; restenosis SVG stent and re-intervention in 2006; 70% residual D1; cath in 11/2007: TO SVG M1  Followed by Cardiology On Aspirin, statin and Coreg Denies any anginal symptoms currently 

## 2021-09-25 NOTE — Assessment & Plan Note (Signed)
Started Gabapentin 100 mg qHS, plan to increase dose as tolerated ?Advised to take Vit B12 1000 mcg QD ?Has been evaluated by neurology ?

## 2021-09-25 NOTE — Assessment & Plan Note (Signed)
On Levothyroxine 50 mcg QD Last TSH - wnl 

## 2021-09-25 NOTE — Patient Instructions (Signed)
Please continue taking medications as prescribed.  Please continue to follow low salt diet and ambulate as tolerated. 

## 2021-12-09 NOTE — Progress Notes (Signed)
Cardiology Office Note    Date:  12/10/2021   ID:  Jorge Bishop, DOB 27-Feb-1936, MRN [id number]  PCP:  Lindell Spar, MD  Cardiologist: Previously Dr. Bronson Ing --> Needs to switch to new MD   Chief Complaint  Patient presents with   Follow-up    Annual Visit    History of Present Illness:    Jorge Bishop is a 86 y.o. male with past medical history of CAD (s/p CABG in 1998, DES to [place] in 2004, restenosis by cath in 2009 with medical management recommended, low-risk NST in 2016), HTN, HLD, BPH, MGUS and DJD who presents to the office today for annual follow-up.  He was last examined by myself in 11/2020 and reported more dyspnea on exertion and fatigue for the past 4 to 6 weeks but was able to walk on local trails at times without symptoms. He did report intermittent chest discomfort which was typically worse with food consumption. A follow-up Lexiscan Myoview was recommended for ischemic evaluation and he was restarted on Protonix 20 mg daily with plans to follow-up with GI if NST was reassuring. Stress test showed findings consistent with prior basal septal infarction but no evidence of ischemia and was a low-risk study.   In talking with the patient today, he reports his activity is more limited secondary to bilateral knee pain and neuropathy. He does not walk on trails as frequently but remains active around his home. He denies any exertional chest pain or dyspnea on exertion. Did have one episode of nocturnal chest pain a few months back but this was an isolated episode with no recurrence since. He denies any specific orthopnea, PND or pitting edema.  Past Medical History:  Diagnosis Date   Arteriosclerotic cardiovascular disease (ASCVD)    Coronary artery bypass graft surgery in 12/98; DES to the RCA SVG in 07/2002; restenosis of the saphenous vein graft stent and renal intervention in 2006; 70% first diagonal present; calf in 11/2007-total obstruction of the saphenous vein  graft to the first marginal   Benign prostatic hypertrophy    Cerebrovascular disease    60% bilateral internal carotid artery stenosis in 2012   Coronary artery disease    4 stents   Diaphragmatic hernia 01/15/2021   Diverticulosis    DJD (degenerative joint disease)    Erectile dysfunction    GERD (gastroesophageal reflux disease)    History of blood transfusion    History of kidney stones    Hyperlipidemia    Lipid profile in 12/2009:104, 91, 36, 50   Hypertension    Kidney stone 09/08/2017   Migraine headache    Right bundle branch block    Right ureteral stone 03/08/2019   Shortness of breath    Thrombocytopenia (HCC)    platelets of 104 in 03/2009   Upper gastrointestinal hemorrhage 69/48    Helicobacter pylori positive; subsequent laparoscopic fundoplication-2009    Past Surgical History:  Procedure Laterality Date   CARDIAC CATHETERIZATION     4 cardiac stents   CATARACT EXTRACTION W/PHACO Right 07/25/2012   Procedure: CATARACT EXTRACTION PHACO AND INTRAOCULAR LENS PLACEMENT ([place]);  Surgeon: Tonny Branch, MD;  Location: [facility];  Service: Ophthalmology;  Laterality: Right;  CDE:23.31   CATARACT EXTRACTION W/PHACO Left 08/18/2012   Procedure: CATARACT EXTRACTION PHACO AND INTRAOCULAR LENS PLACEMENT ([facility]);  Surgeon: Tonny Branch, MD;  Location: [facility];  Service: Ophthalmology;  Laterality: Left;  CDE: 16.07   COLONOSCOPY N/A 08/01/2014   Procedure: COLONOSCOPY;  Surgeon:  Rogene Houston, MD;  Location: [facility];  Service: Endoscopy;  Laterality: N/A;  125 - moved to 10:30 - Ann to notify pt   CORONARY ARTERY BYPASS GRAFT  1998    CYSTOSCOPY/URETEROSCOPY/HOLMIUM LASER/STENT PLACEMENT Right 11/12/2017   Procedure: CYSTOSCOPY/RIGHT RETROGRADE PYELOGRAM/RIGHT URETEROSCOPY/HOLMIUM LASER LITHOTRIPSY RIGHT URETERAL CALCULUS/RIGHT URETERAL STENT PLACEMENT;  Surgeon: [name], MD;  Location: [facility];  Service: Urology;  Laterality: Right;  right ureteral calculus given to patient's wife  per Dr. [name]   CYSTOSCOPY/URETEROSCOPY/HOLMIUM LASER/STENT PLACEMENT Right 03/07/2019   Procedure: CYSTOSCOPY RIGHT RETROGRADE RIGHT URETEROSCOPY;  Surgeon: [name], MD;  Location: [facility];  Service: Urology;  Laterality: Right;   EXTRACORPOREAL SHOCK WAVE LITHOTRIPSY Right 09/13/2017   Procedure: RIGHT EXTRACORPOREAL SHOCK WAVE LITHOTRIPSY (ESWL);  Surgeon: Lucas Mallow, MD;  Location: [facility];  Service: Urology;  Laterality: Right;   HERNIA REPAIR     right inguinal   LAPAROSCOPIC NISSEN FUNDOPLICATION  9562, 1308    revision in 2009 along with repair of paraesophagel hernia    PARAESOPHAGEAL HERNIA REPAIR     TRANSURETHRAL RESECTION OF PROSTATE N/A 03/07/2019   Procedure: TRANSURETHRAL RESECTION OF THE PROSTATE (TURP);  Surgeon: [name], MD;  Location: [facility];  Service: Urology;  Laterality: N/A;    Current Medications: Outpatient Medications Prior to Visit  Medication Sig Dispense Refill   aspirin 81 MG tablet Take 81 mg by mouth daily.       atorvastatin (LIPITOR) 20 MG tablet Take 20 mg by mouth at bedtime.       Cholecalciferol (VITAMIN D3) 50 MCG (2000 UT) TABS Take 10,000 Units by mouth daily.      cyanocobalamin 1000 MCG tablet Take 1,000 mcg by mouth daily.     FIBER PO Take 2 capsules by mouth daily as needed (constipation).     fluorometholone (FML) 0.1 % ophthalmic suspension SMARTSIG:1 Drop(s) In Eye(s) 2-4 Times Daily PRN     gabapentin (NEURONTIN) 100 MG capsule Take 1 capsule (100 mg total) by mouth 3 (three) times daily. 90 capsule 5   nitroGLYCERIN (NITROSTAT) 0.4 MG SL tablet Place 1 tablet (0.4 mg total) under the tongue every 5 (five) minutes as needed for chest pain. 90 tablet 3   ondansetron (ZOFRAN) 4 MG tablet Take 1 tablet (4 mg total) by mouth every 8 (eight) hours as needed for nausea or vomiting. 90 tablet 1   pantoprazole (PROTONIX) 20 MG tablet Take 1 tablet (20 mg total) by mouth daily. 90 tablet 3   promethazine (PHENERGAN) 25 MG tablet Take 25  mg by mouth every 6 (six) hours as needed for nausea or vomiting.     SYNTHROID 50 MCG tablet Take 50 mcg by mouth daily.     traMADol (ULTRAM) 50 MG tablet Take 50 mg by mouth every 6 (six) hours as needed (pain).     carvedilol (COREG) 12.5 MG tablet Take 6.25 mg by mouth 2 (two) times daily with a meal.     No facility-administered medications prior to visit.     Allergies:   Bactrim [sulfamethoxazole-trimethoprim], Clarithromycin, Codeine, Erythromycin, Sulfa antibiotics, and Trimethoprim   Social History   Socioeconomic History   Marital status: Married    Spouse name: Not on file   Number of children: Not on file   Years of education: Not on file   Highest education level: Not on file  Occupational History   Occupation: Full time    Employer: RETIRED  Tobacco Use   Smoking  status: Never   Smokeless tobacco: Never  Vaping Use   Vaping Use: Never used  Substance and Sexual Activity   Alcohol use: No    Alcohol/week: 0.0 standard drinks of alcohol   Drug use: No   Sexual activity: Yes    Birth control/protection: None  Other Topics Concern   Not on file  Social History Narrative   Right handed   Drinks caffeine   Two story home   Social Determinants of Health   Financial Resource Strain: Low Risk  (02/16/2021)   Overall Financial Resource Strain (CARDIA)    Difficulty of Paying Living Expenses: Not hard at all  Food Insecurity: No Food Insecurity (02/16/2021)   Hunger Vital Sign    Worried About Running Out of Food in the Last Year: Never true    [place] in the Last Year: Never true  Transportation Needs: No Transportation Needs (02/16/2021)   PRAPARE - Hydrologist (Medical): No    Lack of Transportation (Non-Medical): No  Physical Activity: Sufficiently Active (02/16/2021)   Exercise Vital Sign    Days of Exercise per Week: 5 days    Minutes of Exercise per Session: 60 min  Stress: No Stress Concern Present (02/16/2021)   [place]    Feeling of Stress : Not at all  Social Connections: Moderately Isolated (02/16/2021)   Social Connection and Isolation Panel [NHANES]    Frequency of Communication with Friends and Family: More than three times a week    Frequency of Social Gatherings with Friends and Family: More than three times a week    Attends Religious Services: Never    Marine scientist or Organizations: No    Attends Archivist Meetings: Never    Marital Status: Married     Family History:  The patient's family history includes CAD in his father.   Review of Systems:    Please see the history of present illness.     All other systems reviewed and are otherwise negative except as noted above.   Physical Exam:    VS:  BP 136/74   Pulse 72   Ht 6' (1.829 m)   Wt 185 lb (83.9 kg)   SpO2 97%   BMI 25.09 kg/m    General: Pleasant male appearing in no acute distress. Head: Normocephalic, atraumatic. Neck: No carotid bruits. JVD not elevated.  Lungs: Respirations regular and unlabored, without wheezes or rales.  Heart: Regular rate and rhythm. No S3 or S4.  No murmur, no rubs, or gallops appreciated. Abdomen: Appears non-distended. No obvious abdominal masses. Msk:  Strength and tone appear normal for age. No obvious joint deformities or effusions. Extremities: No clubbing or cyanosis. No pitting edema.  Distal pedal pulses are 2+ bilaterally. Neuro: Alert and oriented X 3. Moves all extremities spontaneously. No focal deficits noted. Psych:  Responds to questions appropriately with a normal affect. Skin: No rashes or lesions noted  Wt Readings from Last 3 Encounters:  12/10/21 185 lb (83.9 kg)  09/25/21 181 lb 3.2 oz (82.2 kg)  09/23/21 182 lb 4.8 oz (82.7 kg)     Studies/Labs Reviewed:   EKG:  EKG is ordered today. The ekg ordered today demonstrates NSR, HR 72 with known RBBB.   Recent Labs: 09/15/2021: ALT 18;  BUN 18; Creatinine, Ser 1.01; Hemoglobin 15.2; Platelets 139; Potassium 5.4; Sodium 141; TSH 3.050   Lipid Panel  Component Value Date/Time   CHOL 126 01/16/2021 1127   TRIG 53 01/16/2021 1127   HDL 38 (L) 01/16/2021 1127   CHOLHDL 3.3 01/16/2021 1127   CHOLHDL 3.5 03/02/2019 1039   VLDL 14 03/02/2019 1039   LDLCALC 76 01/16/2021 1127    Additional studies/ records that were reviewed today include:   Echocardiogram: 08/2017 Study Conclusions   - Left ventricle: The cavity size was normal. Wall thickness was    increased in a pattern of mild LVH. Systolic function was normal.    The estimated ejection fraction was in the range of 55% to 60%.    Wall motion was normal; there were no regional wall motion    abnormalities. Doppler parameters are consistent with abnormal    left ventricular relaxation (grade 1 diastolic dysfunction).    Doppler parameters are consistent with high ventricular filling    pressure.  - Aortic valve: Moderately calcified annulus. Trileaflet;    moderately thickened leaflets. There was mild regurgitation.    Valve area (VTI): 2.6 cm^2. Valve area (Vmax): 2.25 cm^2. Valve    area (Vmean): 1.81 cm^2.  - Mitral valve: Moderately calcified annulus. Mildly thickened    leaflets .  - Left atrium: The atrium was mildly to moderately dilated.  - Technically adequate study.   NST: 12/2020 There was no ST segment deviation noted during stress. Findings consistent with small prior basal septal myocardial infarction. This is a low risk study. The left ventricular ejection fraction is normal (55-65%).  Carotid Dopplers: 01/2021 Summary:  Right Carotid: Velocities in the right ICA are consistent with a 1-39%  stenosis.   Left Carotid: Velocities in the left ICA are consistent with a 1-39%  stenosis.   Vertebrals:  Bilateral vertebral arteries demonstrate antegrade flow.  Subclavians: Normal flow hemodynamics were seen in bilateral subclavian                arteries.   Assessment:    1. Coronary artery disease involving native coronary artery of native heart without angina pectoris   2. Essential hypertension   3. Hyperlipidemia LDL goal <70   4. MGUS (monoclonal gammopathy of unknown significance)      Plan:   In order of problems listed above:  1. CAD - He is s/p CABG in 1998 with DES to [place] in 2004 and restenosis by cath in 2009 with medical management recommended. He did have a NST in 12/2020 which showed no evidence of ischemia and was a low-risk study. He denies any recent anginal symptoms.  - Continue current medical therapy with ASA 81 mg daily, Atorvastatin 20 mg daily and Coreg 6.25 mg twice daily.  2. HTN - His BP is at 136/74 during today's visit. Continue current medication regimen with Coreg 6.25 mg twice daily.  3. HLD - FLP in 01/2021 showed total cholesterol 126, triglycerides 53, HDL 38 and LDL 76. Remains on Atorvastatin 20 mg daily. Previously on Zetia but wished to discontinue at prior visits. He is anticipating having repeat labs with his PCP in the next several months and if LDL continues to increase, may need to add back Zetia.   4. MGUS  - Being followed by Oncology with plans for continued surveillance at this time.   Medication Adjustments/Labs and Tests Ordered: Current medicines are reviewed at length with the patient today.  Concerns regarding medicines are outlined above.  Medication changes, Labs and Tests ordered today are listed in the Patient Instructions below. Patient Instructions  Medication Instructions:  Continue current medication regimen.   *If you need a refill on your cardiac medications before your next appointment, please call your pharmacy*   Testing/Procedures:  None.    Follow-Up: At [facility], you and your health needs are our priority.  As part of our continuing mission to provide you with exceptional heart care, we have created designated Provider Care Teams.   These Care Teams include your primary Cardiologist (physician) and Advanced Practice Providers (APPs -  Physician Assistants and Nurse Practitioners) who all work together to provide you with the care you need, when you need it.  We recommend signing up for the patient portal called "MyChart".  Sign up information is provided on this After Visit Summary.  MyChart is used to connect with patients for Virtual Visits (Telemedicine).  Patients are able to view lab/test results, encounter notes, upcoming appointments, etc.  Non-urgent messages can be sent to your provider as well.   To learn more about what you can do with MyChart, go to [email or web address].    Your next appointment:   1 year(s)  The format for your next appointment:   In Person  Provider:   With Bernerd Pho, PA or MD   Important Information About Sugar         Signed, Erma Heritage, PA-C  12/10/2021 1:36 PM    [facility] [address] [place], [place] [address] Phone: [phone] Fax: [phone]

## 2021-12-10 ENCOUNTER — Encounter: Payer: Self-pay | Admitting: Student

## 2021-12-10 ENCOUNTER — Ambulatory Visit (INDEPENDENT_AMBULATORY_CARE_PROVIDER_SITE_OTHER): Payer: Medicare Other | Admitting: Student

## 2021-12-10 VITALS — BP 136/74 | HR 72 | Ht 72.0 in | Wt 185.0 lb

## 2021-12-10 DIAGNOSIS — E785 Hyperlipidemia, unspecified: Secondary | ICD-10-CM

## 2021-12-10 DIAGNOSIS — I2510 Atherosclerotic heart disease of native coronary artery without angina pectoris: Secondary | ICD-10-CM | POA: Diagnosis not present

## 2021-12-10 DIAGNOSIS — D472 Monoclonal gammopathy: Secondary | ICD-10-CM | POA: Diagnosis not present

## 2021-12-10 DIAGNOSIS — I10 Essential (primary) hypertension: Secondary | ICD-10-CM

## 2021-12-10 MED ORDER — CARVEDILOL 6.25 MG PO TABS: 6.2500 mg | ORAL_TABLET | Freq: Two times a day (BID) | ORAL | 3 refills | Status: DC

## 2021-12-10 NOTE — Patient Instructions (Signed)
Medication Instructions:   Continue current medication regimen.   *If you need a refill on your cardiac medications before your next appointment, please call your pharmacy*   Testing/Procedures:  None.    Follow-Up: At [facility], you and your health needs are our priority.  As part of our continuing mission to provide you with exceptional heart care, we have created designated Provider Care Teams.  These Care Teams include your primary Cardiologist (physician) and Advanced Practice Providers (APPs -  Physician Assistants and Nurse Practitioners) who all work together to provide you with the care you need, when you need it.  We recommend signing up for the patient portal called "MyChart".  Sign up information is provided on this After Visit Summary.  MyChart is used to connect with patients for Virtual Visits (Telemedicine).  Patients are able to view lab/test results, encounter notes, upcoming appointments, etc.  Non-urgent messages can be sent to your provider as well.   To learn more about what you can do with MyChart, go to [email or web address].    Your next appointment:   1 year(s)  The format for your next appointment:   In Person  Provider:   With Bernerd Pho, PA or MD   Important Information About Sugar

## 2022-01-08 ENCOUNTER — Other Ambulatory Visit: Payer: Self-pay | Admitting: Student

## 2022-01-20 ENCOUNTER — Inpatient Hospital Stay: Payer: Medicare Other | Attending: Hematology

## 2022-01-20 DIAGNOSIS — D472 Monoclonal gammopathy: Secondary | ICD-10-CM | POA: Diagnosis not present

## 2022-01-20 DIAGNOSIS — Z79899 Other long term (current) drug therapy: Secondary | ICD-10-CM | POA: Insufficient documentation

## 2022-01-20 LAB — CBC WITH DIFFERENTIAL/PLATELET
Abs Immature Granulocytes: 0.01 10*3/uL (ref 0.00–0.07)
Basophils Absolute: 0 10*3/uL (ref 0.0–0.1)
Basophils Relative: 0 %
Eosinophils Absolute: 0.2 10*3/uL (ref 0.0–0.5)
Eosinophils Relative: 3 %
HCT: 46.8 % (ref 39.0–52.0)
Hemoglobin: 14.9 g/dL (ref 13.0–17.0)
Immature Granulocytes: 0 %
Lymphocytes Relative: 26 %
Lymphs Abs: 1.3 10*3/uL (ref 0.7–4.0)
MCH: 28.8 pg (ref 26.0–34.0)
MCHC: 31.8 g/dL (ref 30.0–36.0)
MCV: 90.3 fL (ref 80.0–100.0)
Monocytes Absolute: 0.6 10*3/uL (ref 0.1–1.0)
Monocytes Relative: 11 %
Neutro Abs: 3.1 10*3/uL (ref 1.7–7.7)
Neutrophils Relative %: 60 %
Platelets: 129 10*3/uL — ABNORMAL LOW (ref 150–400)
RBC: 5.18 MIL/uL (ref 4.22–5.81)
RDW: 13.3 % (ref 11.5–15.5)
WBC: 5.2 10*3/uL (ref 4.0–10.5)
nRBC: 0 % (ref 0.0–0.2)

## 2022-01-20 LAB — COMPREHENSIVE METABOLIC PANEL
ALT: 24 U/L (ref 0–44)
AST: 24 U/L (ref 15–41)
Albumin: 3.8 g/dL (ref 3.5–5.0)
Alkaline Phosphatase: 62 U/L (ref 38–126)
Anion gap: 4 — ABNORMAL LOW (ref 5–15)
BUN: 20 mg/dL (ref 8–23)
CO2: 29 mmol/L (ref 22–32)
Calcium: 9.3 mg/dL (ref 8.9–10.3)
Chloride: 106 mmol/L (ref 98–111)
Creatinine, Ser: 1.01 mg/dL (ref 0.61–1.24)
GFR, Estimated: >60 mL/min (ref >60)
Glucose, Bld: 112 mg/dL — ABNORMAL HIGH (ref 70–99)
Potassium: 4.8 mmol/L (ref 3.5–5.1)
Sodium: 139 mmol/L (ref 135–145)
Total Bilirubin: 1.1 mg/dL (ref 0.3–1.2)
Total Protein: 7.1 g/dL (ref 6.5–8.1)

## 2022-01-21 LAB — KAPPA/LAMBDA LIGHT CHAINS
Kappa free light chain: 43.6 mg/L — ABNORMAL HIGH (ref 3.3–19.4)
Kappa, lambda light chain ratio: 2.45 — ABNORMAL HIGH (ref 0.26–1.65)
Lambda free light chains: 17.8 mg/L (ref 5.7–26.3)

## 2022-01-22 LAB — PROTEIN ELECTROPHORESIS, SERUM
A/G Ratio: 1.3 (ref 0.7–1.7)
Albumin ELP: 3.7 g/dL (ref 2.9–4.4)
Alpha-1-Globulin: 0.2 g/dL (ref 0.0–0.4)
Alpha-2-Globulin: 0.7 g/dL (ref 0.4–1.0)
Beta Globulin: 0.9 g/dL (ref 0.7–1.3)
Gamma Globulin: 1.1 g/dL (ref 0.4–1.8)
Globulin, Total: 2.8 g/dL (ref 2.2–3.9)
M-Spike, %: 0.4 g/dL — ABNORMAL HIGH
Total Protein ELP: 6.5 g/dL (ref 6.0–8.5)

## 2022-01-27 ENCOUNTER — Inpatient Hospital Stay (HOSPITAL_BASED_OUTPATIENT_CLINIC_OR_DEPARTMENT_OTHER): Payer: Medicare Other | Admitting: Hematology

## 2022-01-27 VITALS — BP 133/79 | HR 85 | Temp 97.9°F | Resp 18 | Ht 72.0 in | Wt 183.0 lb

## 2022-01-27 DIAGNOSIS — D472 Monoclonal gammopathy: Secondary | ICD-10-CM | POA: Diagnosis not present

## 2022-01-27 NOTE — Progress Notes (Signed)
Jorge Bishop, [place] [address]   CLINIC:  Medical Oncology/Hematology  PCP:  Lindell Spar, MD [address] / [place] [address]  [phone]  REASON FOR VISIT:  Follow-up for MGUS  PRIOR THERAPY: none  CURRENT THERAPY: Active surveillance.  INTERVAL HISTORY:  [name], a 86 y.o. male, seen for follow-up of MGUS.  Denies any infections in the last 6 months.  No hospitalizations.  Reports some right arm throbbing on and off for last 1 to 2 weeks.  He is taking gabapentin 100 mg daily.  He cannot take more than that due to drowsiness.  REVIEW OF SYSTEMS:  Review of Systems  Constitutional:  Negative for appetite change and fatigue.  HENT:   Negative for trouble swallowing.   Respiratory:  Negative for shortness of breath.   Gastrointestinal:  Positive for nausea. Negative for constipation and vomiting.  Musculoskeletal:  Positive for arthralgias (4/10).  Neurological:  Positive for numbness. Negative for dizziness.  All other systems reviewed and are negative.   PAST MEDICAL/SURGICAL HISTORY:  Past Medical History:  Diagnosis Date   Arteriosclerotic cardiovascular disease (ASCVD)    Coronary artery bypass graft surgery in 12/98; DES to the RCA SVG in 07/2002; restenosis of the saphenous vein graft stent and renal intervention in 2006; 70% first diagonal present; calf in 11/2007-total obstruction of the saphenous vein graft to the first marginal   Benign prostatic hypertrophy    Cerebrovascular disease    60% bilateral internal carotid artery stenosis in 2012   Coronary artery disease    4 stents   Diaphragmatic hernia 01/15/2021   Diverticulosis    DJD (degenerative joint disease)    Erectile dysfunction    GERD (gastroesophageal reflux disease)    History of blood transfusion    History of kidney stones    Hyperlipidemia    Lipid profile in 12/2009:104, 91, 36, 50   Hypertension    Kidney stone 09/08/2017   Migraine  headache    Right bundle branch block    Right ureteral stone 03/08/2019   Shortness of breath    Thrombocytopenia (HCC)    platelets of 104 in 03/2009   Upper gastrointestinal hemorrhage 62/95    Helicobacter pylori positive; subsequent laparoscopic fundoplication-2009   Past Surgical History:  Procedure Laterality Date   CARDIAC CATHETERIZATION     4 cardiac stents   CATARACT EXTRACTION W/PHACO Right 07/25/2012   Procedure: CATARACT EXTRACTION PHACO AND INTRAOCULAR LENS PLACEMENT ([place]);  Surgeon: Tonny Branch, MD;  Location: [facility];  Service: Ophthalmology;  Laterality: Right;  CDE:23.31   CATARACT EXTRACTION W/PHACO Left 08/18/2012   Procedure: CATARACT EXTRACTION PHACO AND INTRAOCULAR LENS PLACEMENT ([facility]);  Surgeon: Tonny Branch, MD;  Location: [facility];  Service: Ophthalmology;  Laterality: Left;  CDE: 16.07   COLONOSCOPY N/A 08/01/2014   Procedure: COLONOSCOPY;  Surgeon: Rogene Houston, MD;  Location: [facility];  Service: Endoscopy;  Laterality: N/A;  125 - moved to 10:30 - Ann to notify pt   CORONARY ARTERY BYPASS GRAFT  1998    CYSTOSCOPY/URETEROSCOPY/HOLMIUM LASER/STENT PLACEMENT Right 11/12/2017   Procedure: CYSTOSCOPY/RIGHT RETROGRADE PYELOGRAM/RIGHT URETEROSCOPY/HOLMIUM LASER LITHOTRIPSY RIGHT URETERAL CALCULUS/RIGHT URETERAL STENT PLACEMENT;  Surgeon: [name], MD;  Location: [facility];  Service: Urology;  Laterality: Right;  right ureteral calculus given to patient's wife per Dr. [name]   CYSTOSCOPY/URETEROSCOPY/HOLMIUM LASER/STENT PLACEMENT Right 03/07/2019   Procedure: CYSTOSCOPY RIGHT RETROGRADE RIGHT URETEROSCOPY;  Surgeon: [name], MD;  Location: [facility];  Service: Urology;  Laterality: Right;   EXTRACORPOREAL SHOCK WAVE LITHOTRIPSY Right 09/13/2017   Procedure: RIGHT EXTRACORPOREAL SHOCK WAVE LITHOTRIPSY (ESWL);  Surgeon: Lucas Mallow, MD;  Location: [facility];  Service: Urology;  Laterality: Right;   HERNIA REPAIR     right inguinal   LAPAROSCOPIC NISSEN FUNDOPLICATION   1497, 0263    revision in 2009 along with repair of paraesophagel hernia    PARAESOPHAGEAL HERNIA REPAIR     TRANSURETHRAL RESECTION OF PROSTATE N/A 03/07/2019   Procedure: TRANSURETHRAL RESECTION OF THE PROSTATE (TURP);  Surgeon: [name], MD;  Location: [facility];  Service: Urology;  Laterality: N/A;    SOCIAL HISTORY:  Social History   Socioeconomic History   Marital status: Married    Spouse name: Not on file   Number of children: Not on file   Years of education: Not on file   Highest education level: Not on file  Occupational History   Occupation: Full time    Employer: RETIRED  Tobacco Use   Smoking status: Never   Smokeless tobacco: Never  Vaping Use   Vaping Use: Never used  Substance and Sexual Activity   Alcohol use: No    Alcohol/week: 0.0 standard drinks of alcohol   Drug use: No   Sexual activity: Yes    Birth control/protection: None  Other Topics Concern   Not on file  Social History Narrative   Right handed   Drinks caffeine   Two story home   Social Determinants of Health   Financial Resource Strain: Low Risk  (02/16/2021)   Overall Financial Resource Strain (CARDIA)    Difficulty of Paying Living Expenses: Not hard at all  Food Insecurity: No Food Insecurity (02/16/2021)   Hunger Vital Sign    Worried About Running Out of Food in the Last Year: Never true    [place] in the Last Year: Never true  Transportation Needs: No Transportation Needs (02/16/2021)   PRAPARE - Hydrologist (Medical): No    Lack of Transportation (Non-Medical): No  Physical Activity: Sufficiently Active (02/16/2021)   Exercise Vital Sign    Days of Exercise per Week: 5 days    Minutes of Exercise per Session: 60 min  Stress: No Stress Concern Present (02/16/2021)   [place]    Feeling of Stress : Not at all  Social Connections: Moderately Isolated (02/16/2021)   Social Connection  and Isolation Panel [NHANES]    Frequency of Communication with Friends and Family: More than three times a week    Frequency of Social Gatherings with Friends and Family: More than three times a week    Attends Religious Services: Never    Marine scientist or Organizations: No    Attends Archivist Meetings: Never    Marital Status: Married  Human resources officer Violence: Not At Risk (02/16/2021)   Humiliation, Afraid, Rape, and Kick questionnaire    Fear of Current or Ex-Partner: No    Emotionally Abused: No    Physically Abused: No    Sexually Abused: No    FAMILY HISTORY:  Family History  Problem Relation Age of Onset   CAD Father     CURRENT MEDICATIONS:  Current Outpatient Medications  Medication Sig Dispense Refill   aspirin 81 MG tablet Take 81 mg by mouth daily.       atorvastatin (LIPITOR) 20 MG tablet Take  20 mg by mouth at bedtime.       carvedilol (COREG) 6.25 MG tablet Take 1 tablet (6.25 mg total) by mouth 2 (two) times daily with a meal. 180 tablet 3   Cholecalciferol (VITAMIN D3) 50 MCG (2000 UT) TABS Take 10,000 Units by mouth daily.      cyanocobalamin 1000 MCG tablet Take 1,000 mcg by mouth daily.     FIBER PO Take 2 capsules by mouth daily as needed (constipation).     fluorometholone (FML) 0.1 % ophthalmic suspension SMARTSIG:1 Drop(s) In Eye(s) 2-4 Times Daily PRN     gabapentin (NEURONTIN) 100 MG capsule Take 1 capsule (100 mg total) by mouth 3 (three) times daily. 90 capsule 5   ondansetron (ZOFRAN) 4 MG tablet Take 1 tablet (4 mg total) by mouth every 8 (eight) hours as needed for nausea or vomiting. 90 tablet 1   pantoprazole (PROTONIX) 20 MG tablet TAKE 1 TABLET DAILY 90 tablet 0   promethazine (PHENERGAN) 25 MG tablet Take 25 mg by mouth every 6 (six) hours as needed for nausea or vomiting.     SYNTHROID 50 MCG tablet Take 50 mcg by mouth daily.     traMADol (ULTRAM) 50 MG tablet Take 50 mg by mouth every 6 (six) hours as needed (pain).      nitroGLYCERIN (NITROSTAT) 0.4 MG SL tablet Place 1 tablet (0.4 mg total) under the tongue every 5 (five) minutes as needed for chest pain. 90 tablet 3   No current facility-administered medications for this visit.    ALLERGIES:  Allergies  Allergen Reactions   Bactrim [Sulfamethoxazole-Trimethoprim] Anaphylaxis   Clarithromycin Anaphylaxis    REACTION: UNKNOWN REACTION   Codeine Nausea And Vomiting   Erythromycin Anaphylaxis   Sulfa Antibiotics Anaphylaxis   Trimethoprim     PHYSICAL EXAM:  Performance status (ECOG): 1 - Symptomatic but completely ambulatory  Vitals:   01/27/22 1421  BP: 133/79  Pulse: 85  Resp: 18  Temp: 97.9 F (36.6 C)  SpO2: 98%   Wt Readings from Last 3 Encounters:  01/27/22 183 lb (83 kg)  12/10/21 185 lb (83.9 kg)  09/25/21 181 lb 3.2 oz (82.2 kg)   Physical Exam Vitals reviewed.  Constitutional:      Appearance: Normal appearance.  Cardiovascular:     Rate and Rhythm: Regular rhythm.     Heart sounds: Normal heart sounds.  Pulmonary:     Breath sounds: Normal breath sounds.  Neurological:     Mental Status: He is alert.  Psychiatric:        Mood and Affect: Mood normal.        Behavior: Behavior normal.     LABORATORY DATA:  I have reviewed the labs as listed.     Latest Ref Rng & Units 01/20/2022    1:29 PM 09/15/2021   11:35 AM 09/03/2021    1:57 PM  CBC  WBC 4.0 - 10.5 K/uL 5.2  7.0  7.0   Hemoglobin 13.0 - 17.0 g/dL 14.9  15.2  14.5   Hematocrit 39.0 - 52.0 % 46.8  46.4  45.3   Platelets 150 - 400 K/uL 129  139  151       Latest Ref Rng & Units 01/20/2022    1:29 PM 09/15/2021   11:35 AM 09/03/2021    1:57 PM  CMP  Glucose 70 - 99 mg/dL 112  111  118   BUN 8 - 23 mg/dL '20  18  27   ' Creatinine  0.61 - 1.24 mg/dL 1.01  1.01  1.04   Sodium 135 - 145 mmol/L 139  141  139   Potassium 3.5 - 5.1 mmol/L 4.8  5.4  4.4   Chloride 98 - 111 mmol/L 106  104  103   CO2 22 - 32 mmol/L '29  25  27   ' Calcium 8.9 - 10.3 mg/dL 9.3  9.6  9.2    Total Protein 6.5 - 8.1 g/dL 7.1  6.7  7.2   Total Bilirubin 0.3 - 1.2 mg/dL 1.1  0.8  1.0   Alkaline Phos 38 - 126 U/L 62  83  67   AST 15 - 41 U/L '24  20  22   ' ALT 0 - 44 U/L '24  18  21       ' Component Value Date/Time   RBC 5.18 01/20/2022 1329   MCV 90.3 01/20/2022 1329   MCV 89 09/15/2021 1135   MCH 28.8 01/20/2022 1329   MCHC 31.8 01/20/2022 1329   RDW 13.3 01/20/2022 1329   RDW 12.4 09/15/2021 1135   LYMPHSABS 1.3 01/20/2022 1329   LYMPHSABS 1.5 09/15/2021 1135   MONOABS 0.6 01/20/2022 1329   EOSABS 0.2 01/20/2022 1329   EOSABS 0.2 09/15/2021 1135   BASOSABS 0.0 01/20/2022 1329   BASOSABS 0.0 09/15/2021 1135    DIAGNOSTIC IMAGING:  I have independently reviewed the scans and discussed with the patient. No results found.   ASSESSMENT:  MGUS: - He reported lower extremity neuropathy for 4 years with worsening balance problems.  He uses a cane to ambulate.  He also has vertigo which is chronic. - He was evaluated by Dr. Narda Amber for neuropathy.  SPEP showed 0.6 g of M spike on 08/08/2021.  F75, folic acid and copper were normal. - He reports that he lost about 30 pounds over the last 2 years due to decreased appetite. - He also reports easy bruising on the hands and forearms for the past 8 years.    Social/family history: - Lives at home with his wife.  He worked as a Psychologist, occupational at cardiac rehab until 2 years ago.  He is retired from [employer].  He was exposed to agent orange in [place].  He is a non-smoker.   PLAN:  IgG kappa MGUS: - We reviewed labs from 01/20/2022 which showed normal LFTs.  M spike is 0.4 g.  CBC was grossly normal with mild thrombocytopenia.  Free light chain ratio improved to 2.45 from 2.71 previously. - No "crab" features.  RTC 6 months with repeat labs and skeletal survey.   2.  Peripheral neuropathy: - Lower extremity neuropathy for the past few years. - Continue gabapentin 100 mg daily.  Any higher doses cause drowsiness.  Orders placed  this encounter:  Orders Placed This Encounter  Procedures   DG Bone Survey Met   CBC with Differential/Platelet   Comprehensive metabolic panel   Lactate dehydrogenase   Protein electrophoresis, serum   Kappa/lambda light chains      Derek Jack, MD [place] [phone]   I, Thana Ates, am acting as a scribe for Dr. Derek Jack.  I, Derek Jack MD, have reviewed the above documentation for accuracy and completeness, and I agree with the above.

## 2022-01-27 NOTE — Patient Instructions (Signed)
[place] at [facility] Discharge Instructions  You were seen and examined today by Dr. Delton Coombes.  Dr. Delton Coombes discussed your most recent lab work and all of your numbers have slightly improved since last visit.  Follow-up as scheduled in 6 months with labs and skeletal survey.    Thank you for choosing [place] at [facility] to provide your oncology and hematology care.  To afford each patient quality time with our provider, please arrive at least 15 minutes before your scheduled appointment time.   If you have a lab appointment with the [facility] please come in thru the Main Entrance and check in at the main information desk.  You need to re-schedule your appointment should you arrive 10 or more minutes late.  We strive to give you quality time with our providers, and arriving late affects you and other patients whose appointments are after yours.  Also, if you no show three or more times for appointments you may be dismissed from the clinic at the providers discretion.     Again, thank you for choosing [facility].  Our hope is that these requests will decrease the amount of time that you wait before being seen by our physicians.       _____________________________________________________________  Should you have questions after your visit to [facility], please contact our office at [phone] and follow the prompts.  Our office hours are 8:00 a.m. and 4:30 p.m. Monday - Friday.  Please note that voicemails left after 4:00 p.m. may not be returned until the following business day.  We are closed weekends and major holidays.  You do have access to a nurse 24-7, just call the main number to the clinic [phone] and do not press any options, hold on the line and a nurse will answer the phone.    For prescription refill requests, have your pharmacy contact our office and allow 72 hours.

## 2022-02-17 ENCOUNTER — Ambulatory Visit (INDEPENDENT_AMBULATORY_CARE_PROVIDER_SITE_OTHER): Payer: Medicare Other

## 2022-02-17 DIAGNOSIS — Z0000 Encounter for general adult medical examination without abnormal findings: Secondary | ICD-10-CM

## 2022-02-17 NOTE — Progress Notes (Signed)
Subjective:   Jorge Bishop is a 86 y.o. male who presents for Medicare Annual/Subsequent preventive examination. I connected with  Jorge Bishop on 02/17/22 by a audio enabled telemedicine application and verified that I am speaking with the correct person using two identifiers.  Patient Location: Home  Provider Location: Office/Clinic  I discussed the limitations of evaluation and management by telemedicine. The patient expressed understanding and agreed to proceed.   Review of Systems     Jorge Bishop , Thank you for taking time to come for your Medicare Wellness Visit. I appreciate your ongoing commitment to your health goals. Please review the following plan we discussed and let me know if I can assist you in the future.   These are the goals we discussed:  Goals      Patient Stated     Continue PT and do well        This is a list of the screening recommended for you and due dates:  Health Maintenance  Topic Date Due   COVID-19 Vaccine (5 - Moderna risk series) 05/29/2021   Flu Shot  01/13/2022   Tetanus Vaccine  08/27/2024   Pneumonia Vaccine  Completed   Zoster (Shingles) Vaccine  Completed   HPV Vaccine  Aged Out   Complete foot exam   Discontinued   Hemoglobin A1C  Discontinued   Eye exam for diabetics  Discontinued   Urine Protein Check  Discontinued          Objective:    There were no vitals filed for this visit. There is no height or weight on file to calculate BMI.     09/23/2021    1:42 PM 09/03/2021    1:29 PM 08/08/2021   11:01 AM 02/16/2021   11:15 AM 02/05/2021    3:28 PM 03/07/2019    6:15 PM 03/07/2019    9:04 AM  Advanced Directives  Does Patient Have a Medical Advance Directive? Yes Yes Yes Yes No Yes Yes  Type of Paramedic of [place];Living will [place];Living will  Healthcare Power of [place];Living will [place];Living will  Does patient want  to make changes to medical advance directive? No - Patient declined No - Patient declined  No - Patient declined  Yes (Inpatient - patient defers changing a medical advance directive at this time - Information given) No - Patient declined  Copy of [place] in Chart? No - copy requested No - copy requested     No - copy requested  Would patient like information on creating a medical advance directive?    No - Patient declined No - Patient declined      Current Medications (verified) Outpatient Encounter Medications as of 02/17/2022  Medication Sig   aspirin 81 MG tablet Take 81 mg by mouth daily.     atorvastatin (LIPITOR) 20 MG tablet Take 20 mg by mouth at bedtime.     carvedilol (COREG) 6.25 MG tablet Take 1 tablet (6.25 mg total) by mouth 2 (two) times daily with a meal.   Cholecalciferol (VITAMIN D3) 50 MCG (2000 UT) TABS Take 10,000 Units by mouth daily.    cyanocobalamin 1000 MCG tablet Take 1,000 mcg by mouth daily.   FIBER PO Take 2 capsules by mouth daily as needed (constipation).   fluorometholone (FML) 0.1 % ophthalmic suspension SMARTSIG:1 Drop(s) In Eye(s) 2-4 Times Daily PRN   gabapentin (NEURONTIN) 100  MG capsule Take 1 capsule (100 mg total) by mouth 3 (three) times daily.   nitroGLYCERIN (NITROSTAT) 0.4 MG SL tablet Place 1 tablet (0.4 mg total) under the tongue every 5 (five) minutes as needed for chest pain.   ondansetron (ZOFRAN) 4 MG tablet Take 1 tablet (4 mg total) by mouth every 8 (eight) hours as needed for nausea or vomiting.   pantoprazole (PROTONIX) 20 MG tablet TAKE 1 TABLET DAILY   promethazine (PHENERGAN) 25 MG tablet Take 25 mg by mouth every 6 (six) hours as needed for nausea or vomiting.   SYNTHROID 50 MCG tablet Take 50 mcg by mouth daily.   traMADol (ULTRAM) 50 MG tablet Take 50 mg by mouth every 6 (six) hours as needed (pain).   No facility-administered encounter medications on file as of 02/17/2022.    Allergies (verified) Bactrim  [sulfamethoxazole-trimethoprim], Clarithromycin, Codeine, Erythromycin, Sulfa antibiotics, and Trimethoprim   History: Past Medical History:  Diagnosis Date   Arteriosclerotic cardiovascular disease (ASCVD)    Coronary artery bypass graft surgery in 12/98; DES to the RCA SVG in 07/2002; restenosis of the saphenous vein graft stent and renal intervention in 2006; 70% first diagonal present; calf in 11/2007-total obstruction of the saphenous vein graft to the first marginal   Benign prostatic hypertrophy    Cerebrovascular disease    60% bilateral internal carotid artery stenosis in 2012   Coronary artery disease    4 stents   Diaphragmatic hernia 01/15/2021   Diverticulosis    DJD (degenerative joint disease)    Erectile dysfunction    GERD (gastroesophageal reflux disease)    History of blood transfusion    History of kidney stones    Hyperlipidemia    Lipid profile in 12/2009:104, 91, 36, 50   Hypertension    Kidney stone 09/08/2017   Migraine headache    Right bundle branch block    Right ureteral stone 03/08/2019   Shortness of breath    Thrombocytopenia (HCC)    platelets of 104 in 03/2009   Upper gastrointestinal hemorrhage 08/65    Helicobacter pylori positive; subsequent laparoscopic fundoplication-2009   Past Surgical History:  Procedure Laterality Date   CARDIAC CATHETERIZATION     4 cardiac stents   CATARACT EXTRACTION W/PHACO Right 07/25/2012   Procedure: CATARACT EXTRACTION PHACO AND INTRAOCULAR LENS PLACEMENT ([place]);  Surgeon: Tonny Branch, MD;  Location: [facility];  Service: Ophthalmology;  Laterality: Right;  CDE:23.31   CATARACT EXTRACTION W/PHACO Left 08/18/2012   Procedure: CATARACT EXTRACTION PHACO AND INTRAOCULAR LENS PLACEMENT ([facility]);  Surgeon: Tonny Branch, MD;  Location: [facility];  Service: Ophthalmology;  Laterality: Left;  CDE: 16.07   COLONOSCOPY N/A 08/01/2014   Procedure: COLONOSCOPY;  Surgeon: Rogene Houston, MD;  Location: [facility];  Service: Endoscopy;   Laterality: N/A;  125 - moved to 10:30 - Ann to notify pt   CORONARY ARTERY BYPASS GRAFT  1998    CYSTOSCOPY/URETEROSCOPY/HOLMIUM LASER/STENT PLACEMENT Right 11/12/2017   Procedure: CYSTOSCOPY/RIGHT RETROGRADE PYELOGRAM/RIGHT URETEROSCOPY/HOLMIUM LASER LITHOTRIPSY RIGHT URETERAL CALCULUS/RIGHT URETERAL STENT PLACEMENT;  Surgeon: [name], MD;  Location: [facility];  Service: Urology;  Laterality: Right;  right ureteral calculus given to patient's wife per Dr. [name]   CYSTOSCOPY/URETEROSCOPY/HOLMIUM LASER/STENT PLACEMENT Right 03/07/2019   Procedure: CYSTOSCOPY RIGHT RETROGRADE RIGHT URETEROSCOPY;  Surgeon: [name], MD;  Location: [facility];  Service: Urology;  Laterality: Right;   EXTRACORPOREAL SHOCK WAVE LITHOTRIPSY Right 09/13/2017   Procedure: RIGHT EXTRACORPOREAL SHOCK WAVE LITHOTRIPSY (ESWL);  Surgeon: Marton Redwood  III, MD;  Location: [facility];  Service: Urology;  Laterality: Right;   HERNIA REPAIR     right inguinal   LAPAROSCOPIC NISSEN FUNDOPLICATION  1448, 1856    revision in 2009 along with repair of paraesophagel hernia    PARAESOPHAGEAL HERNIA REPAIR     TRANSURETHRAL RESECTION OF PROSTATE N/A 03/07/2019   Procedure: TRANSURETHRAL RESECTION OF THE PROSTATE (TURP);  Surgeon: [name], MD;  Location: [facility];  Service: Urology;  Laterality: N/A;   Family History  Problem Relation Age of Onset   CAD Father    Social History   Socioeconomic History   Marital status: Married    Spouse name: Not on file   Number of children: Not on file   Years of education: Not on file   Highest education level: Not on file  Occupational History   Occupation: Full time    Employer: RETIRED  Tobacco Use   Smoking status: Never   Smokeless tobacco: Never  Vaping Use   Vaping Use: Never used  Substance and Sexual Activity   Alcohol use: No    Alcohol/week: 0.0 standard drinks of alcohol   Drug use: No   Sexual activity: Yes    Birth control/protection: None  Other Topics Concern   Not on  file  Social History Narrative   Right handed   Drinks caffeine   Two story home   Social Determinants of Health   Financial Resource Strain: Low Risk  (02/16/2021)   Overall Financial Resource Strain (CARDIA)    Difficulty of Paying Living Expenses: Not hard at all  Food Insecurity: No Food Insecurity (02/16/2021)   Hunger Vital Sign    Worried About Running Out of Food in the Last Year: Never true    [place] in the Last Year: Never true  Transportation Needs: No Transportation Needs (02/16/2021)   PRAPARE - Hydrologist (Medical): No    Lack of Transportation (Non-Medical): No  Physical Activity: Sufficiently Active (02/16/2021)   Exercise Vital Sign    Days of Exercise per Week: 5 days    Minutes of Exercise per Session: 60 min  Stress: No Stress Concern Present (02/16/2021)   [place]    Feeling of Stress : Not at all  Social Connections: Moderately Isolated (02/16/2021)   Social Connection and Isolation Panel [NHANES]    Frequency of Communication with Friends and Family: More than three times a week    Frequency of Social Gatherings with Friends and Family: More than three times a week    Attends Religious Services: Never    Marine scientist or Organizations: No    Attends Archivist Meetings: Never    Marital Status: Married    Tobacco Counseling Counseling given: Not Answered   Clinical Intake:                 Diabetic? No         Activities of Daily Living     No data to display           Patient Care Team: Lindell Spar, MD as PCP - General (Internal Medicine) Derek Jack, MD as Medical Oncologist (Hematology)  Indicate any recent Medical Services you may have received from other than [facility] providers in the past year (date may be approximate).     Assessment:   This is a routine wellness examination for  Jorge Bishop.  Hearing/Vision screen No results found.  Dietary issues and exercise activities discussed:     Goals Addressed   None   Depression Screen    09/25/2021    1:07 PM 05/21/2021    1:14 PM 02/16/2021   11:16 AM 02/16/2021   11:13 AM 01/15/2021    1:26 PM  PHQ 2/9 Scores  PHQ - 2 Score 0 0 0 0 0    Fall Risk    09/25/2021    1:07 PM 08/08/2021   11:01 AM 05/21/2021    1:14 PM 02/16/2021   11:16 AM 01/15/2021    1:25 PM  Fall Risk   Falls in the past year? 0 1 1 0 1  Number falls in past yr: 0 1 1 0 1  Injury with Fall? 0 0 1 0 0  Risk for fall due to : No Fall Risks  History of fall(s);Impaired balance/gait;Impaired mobility No Fall Risks History of fall(s);Impaired balance/gait  Follow up Falls evaluation completed  Falls evaluation completed;Falls prevention discussed Falls evaluation completed Falls evaluation completed;Education provided;Falls prevention discussed;Follow up appointment    FALL RISK PREVENTION PERTAINING TO THE HOME:  Any stairs in or around the home? Yes  If so, are there any without handrails? No  Home free of loose throw rugs in walkways, pet beds, electrical cords, etc? Yes  Adequate lighting in your home to reduce risk of falls? Yes   ASSISTIVE DEVICES UTILIZED TO PREVENT FALLS:  Life alert? No  Use of a cane, walker or w/c? Yes  Grab bars in the bathroom? Yes  Shower chair or bench in shower? No  Elevated toilet seat or a handicapped toilet? Yes    Cognitive Function:    02/16/2021   11:17 AM  MMSE - Mini Mental State Exam  Not completed: Unable to complete        02/16/2021   11:18 AM  6CIT Screen  What Year? 0 points  What month? 0 points  What time? 0 points  Count back from 20 0 points  Months in reverse 0 points  Repeat phrase 0 points  Total Score 0 points    Immunizations Immunization History  Administered Date(s) Administered   Fluad Quad(high Dose 65+) 03/08/2019, 02/27/2021   Influenza Split 02/14/2016   Influenza,  High Dose Seasonal PF 03/15/2014, 03/15/2018   Influenza-Unspecified 03/08/2019, 03/06/2020   Moderna Covid-19 Vaccine Bivalent Booster 21yr & up 04/03/2021   Moderna SARS-COV2 Booster Vaccination 10/17/2020   Moderna Sars-Covid-2 Vaccination 07/10/2019, 08/08/2019, 04/17/2020   Pneumococcal Conjugate-13 10/18/2015   Pneumococcal Polysaccharide-23 02/18/2017   Td (Adult) 08/28/2014   Tdap 08/28/2014   Zoster Recombinat (Shingrix) 02/18/2017, 04/27/2017    TDAP status: Up to date  Flu Vaccine status: Due, Education has been provided regarding the importance of this vaccine. Advised may receive this vaccine at local pharmacy or Health Dept. Aware to provide a copy of the vaccination record if obtained from local pharmacy or Health Dept. Verbalized acceptance and understanding.  Pneumococcal vaccine status: Up to date  Covid-19 vaccine status: Completed vaccines  Qualifies for Shingles Vaccine? Yes   Zostavax completed Yes   Shingrix Completed?: Yes  Screening Tests Health Maintenance  Topic Date Due   COVID-19 Vaccine (5 - Moderna risk series) 05/29/2021   INFLUENZA VACCINE  01/13/2022   TETANUS/TDAP  08/27/2024   Pneumonia Vaccine 86 Years old  Completed   Zoster Vaccines- Shingrix  Completed   HPV VACCINES  Aged Out   FOOT EXAM  Discontinued  HEMOGLOBIN A1C  Discontinued   OPHTHALMOLOGY EXAM  Discontinued   URINE MICROALBUMIN  Discontinued    Health Maintenance  Health Maintenance Due  Topic Date Due   COVID-19 Vaccine (5 - Moderna risk series) 05/29/2021   INFLUENZA VACCINE  01/13/2022    Colorectal cancer screening: No longer required.   Lung Cancer Screening: (Low Dose CT Chest recommended if Age 33-80 years, 30 pack-year currently smoking OR have quit w/in 15years.) does not qualify.     Additional Screening:  Hepatitis C Screening: does qualify; Completed   Vision Screening: Recommended annual ophthalmology exams for early detection of glaucoma and  other disorders of the eye. Is the patient up to date with their annual eye exam?  Yes  Who is the provider or what is the name of the office in which the patient attends annual eye exams? [facility]  Dental Screening: Recommended annual dental exams for proper oral hygiene  Community Resource Referral / Chronic Care Management: CRR required this visit?  No   CCM required this visit?  No      Plan:     I have personally reviewed and noted the following in the patient's chart:   Medical and social history Use of alcohol, tobacco or illicit drugs  Current medications and supplements including opioid prescriptions. Patient is not currently taking opioid prescriptions. Functional ability and status Nutritional status Physical activity Advanced directives List of other physicians Hospitalizations, surgeries, and ER visits in previous 12 months Vitals Screenings to include cognitive, depression, and falls Referrals and appointments  In addition, I have reviewed and discussed with patient certain preventive protocols, quality metrics, and best practice recommendations. A written personalized care plan for preventive services as well as general preventive health recommendations were provided to patient.     Johny Drilling, [place]   02/17/2022   Nurse Notes:  Mr. Henning , Thank you for taking time to come for your Medicare Wellness Visit. I appreciate your ongoing commitment to your health goals. Please review the following plan we discussed and let me know if I can assist you in the future.   These are the goals we discussed:  Goals      Patient Stated     Continue PT and do well        This is a list of the screening recommended for you and due dates:  Health Maintenance  Topic Date Due   COVID-19 Vaccine (5 - Moderna risk series) 05/29/2021   Flu Shot  01/13/2022   Tetanus Vaccine  08/27/2024   Pneumonia Vaccine  Completed   Zoster (Shingles) Vaccine  Completed    HPV Vaccine  Aged Out   Complete foot exam   Discontinued   Hemoglobin A1C  Discontinued   Eye exam for diabetics  Discontinued   Urine Protein Check  Discontinued

## 2022-02-17 NOTE — Patient Instructions (Signed)
[name] , Thank you for taking time to come for your Medicare Wellness Visit. I appreciate your ongoing commitment to your health goals. Please review the following plan we discussed and let me know if I can assist you in the future.   Screening recommendations/referrals: Recommended yearly ophthalmology/optometry visit for glaucoma screening and checkup Recommended yearly dental visit for hygiene and checkup  Vaccinations: Influenza vaccine: Due Pneumococcal vaccine: Completed Tdap vaccine: Completed Shingles vaccine: Completed    Advanced directives: Copy in chart  Conditions/risks identified: Falls, hypertension  Next appointment: 1 year  Preventive Care 84 Years and Older, Male Preventive care refers to lifestyle choices and visits with your health care provider that can promote health and wellness. What does preventive care include? A yearly physical exam. This is also called an annual well check. Dental exams once or twice a year. Routine eye exams. Ask your health care provider how often you should have your eyes checked. Personal lifestyle choices, including: Daily care of your teeth and gums. Regular physical activity. Eating a healthy diet. Avoiding tobacco and drug use. Limiting alcohol use. Practicing safe sex. Taking low doses of aspirin every day. Taking vitamin and mineral supplements as recommended by your health care provider. What happens during an annual well check? The services and screenings done by your health care provider during your annual well check will depend on your age, overall health, lifestyle risk factors, and family history of disease. Counseling  Your health care provider may ask you questions about your: Alcohol use. Tobacco use. Drug use. Emotional well-being. Home and relationship well-being. Sexual activity. Eating habits. History of falls. Memory and ability to understand (cognition). Work and work Statistician. Screening  You  may have the following tests or measurements: Height, weight, and BMI. Blood pressure. Lipid and cholesterol levels. These may be checked every 5 years, or more frequently if you are over 63 years old. Skin check. Lung cancer screening. You may have this screening every year starting at age 6 if you have a 30-pack-year history of smoking and currently smoke or have quit within the past 15 years. Fecal occult blood test (FOBT) of the stool. You may have this test every year starting at age 72. Flexible sigmoidoscopy or colonoscopy. You may have a sigmoidoscopy every 5 years or a colonoscopy every 10 years starting at age 35. Prostate cancer screening. Recommendations will vary depending on your family history and other risks. Hepatitis C blood test. Hepatitis B blood test. Sexually transmitted disease (STD) testing. Diabetes screening. This is done by checking your blood sugar (glucose) after you have not eaten for a while (fasting). You may have this done every 1-3 years. Abdominal aortic aneurysm (AAA) screening. You may need this if you are a current or former smoker. Osteoporosis. You may be screened starting at age 62 if you are at high risk. Talk with your health care provider about your test results, treatment options, and if necessary, the need for more tests. Vaccines  Your health care provider may recommend certain vaccines, such as: Influenza vaccine. This is recommended every year. Tetanus, diphtheria, and acellular pertussis (Tdap, Td) vaccine. You may need a Td booster every 10 years. Zoster vaccine. You may need this after age 46. Pneumococcal 13-valent conjugate (PCV13) vaccine. One dose is recommended after age 20. Pneumococcal polysaccharide (PPSV23) vaccine. One dose is recommended after age 98. Talk to your health care provider about which screenings and vaccines you need and how often you need them. This information is  not intended to replace advice given to you by your  health care provider. Make sure you discuss any questions you have with your health care provider. Document Released: 06/28/2015 Document Revised: 02/19/2016 Document Reviewed: 04/02/2015 Elsevier Interactive Patient Education  2017 [place] Prevention in the Home Falls can cause injuries. They can happen to people of all ages. There are many things you can do to make your home safe and to help prevent falls. What can I do on the outside of my home? Regularly fix the edges of walkways and driveways and fix any cracks. Remove anything that might make you trip as you walk through a door, such as a raised step or threshold. Trim any bushes or trees on the path to your home. Use bright outdoor lighting. Clear any walking paths of anything that might make someone trip, such as rocks or tools. Regularly check to see if handrails are loose or broken. Make sure that both sides of any steps have handrails. Any raised decks and porches should have guardrails on the edges. Have any leaves, snow, or ice cleared regularly. Use sand or salt on walking paths during winter. Clean up any spills in your garage right away. This includes oil or grease spills. What can I do in the bathroom? Use night lights. Install grab bars by the toilet and in the tub and shower. Do not use towel bars as grab bars. Use non-skid mats or decals in the tub or shower. If you need to sit down in the shower, use a plastic, non-slip stool. Keep the floor dry. Clean up any water that spills on the floor as soon as it happens. Remove soap buildup in the tub or shower regularly. Attach bath mats securely with double-sided non-slip rug tape. Do not have throw rugs and other things on the floor that can make you trip. What can I do in the bedroom? Use night lights. Make sure that you have a light by your bed that is easy to reach. Do not use any sheets or blankets that are too big for your bed. They should not hang down  onto the floor. Have a firm chair that has side arms. You can use this for support while you get dressed. Do not have throw rugs and other things on the floor that can make you trip. What can I do in the kitchen? Clean up any spills right away. Avoid walking on wet floors. Keep items that you use a lot in easy-to-reach places. If you need to reach something above you, use a strong step stool that has a grab bar. Keep electrical cords out of the way. Do not use floor polish or wax that makes floors slippery. If you must use wax, use non-skid floor wax. Do not have throw rugs and other things on the floor that can make you trip. What can I do with my stairs? Do not leave any items on the stairs. Make sure that there are handrails on both sides of the stairs and use them. Fix handrails that are broken or loose. Make sure that handrails are as long as the stairways. Check any carpeting to make sure that it is firmly attached to the stairs. Fix any carpet that is loose or worn. Avoid having throw rugs at the top or bottom of the stairs. If you do have throw rugs, attach them to the floor with carpet tape. Make sure that you have a light switch at the top of the  stairs and the bottom of the stairs. If you do not have them, ask someone to add them for you. What else can I do to help prevent falls? Wear shoes that: Do not have high heels. Have rubber bottoms. Are comfortable and fit you well. Are closed at the toe. Do not wear sandals. If you use a stepladder: Make sure that it is fully opened. Do not climb a closed stepladder. Make sure that both sides of the stepladder are locked into place. Ask someone to hold it for you, if possible. Clearly mark and make sure that you can see: Any grab bars or handrails. First and last steps. Where the edge of each step is. Use tools that help you move around (mobility aids) if they are needed. These include: Canes. Walkers. Scooters. Crutches. Turn  on the lights when you go into a dark area. Replace any light bulbs as soon as they burn out. Set up your furniture so you have a clear path. Avoid moving your furniture around. If any of your floors are uneven, fix them. If there are any pets around you, be aware of where they are. Review your medicines with your doctor. Some medicines can make you feel dizzy. This can increase your chance of falling. Ask your doctor what other things that you can do to help prevent falls. This information is not intended to replace advice given to you by your health care provider. Make sure you discuss any questions you have with your health care provider. Document Released: 03/28/2009 Document Revised: 11/07/2015 Document Reviewed: 07/06/2014 Elsevier Interactive Patient Education  2017 [employer].

## 2022-03-09 ENCOUNTER — Ambulatory Visit (INDEPENDENT_AMBULATORY_CARE_PROVIDER_SITE_OTHER): Payer: Medicare Other

## 2022-03-09 DIAGNOSIS — Z23 Encounter for immunization: Secondary | ICD-10-CM

## 2022-03-30 ENCOUNTER — Telehealth: Payer: Self-pay

## 2022-03-30 NOTE — Telephone Encounter (Signed)
Patient came by the office asked if Dr Posey Pronto will approve and send in gout medicine has discuss last visit.  Gout medicine patient request 3 refills   [place], [place]  [address] [place] [address]  Phone:  [phone]  Fax:  [phone]  DEA #:  --

## 2022-04-07 ENCOUNTER — Encounter: Payer: Self-pay | Admitting: Internal Medicine

## 2022-04-07 ENCOUNTER — Ambulatory Visit (INDEPENDENT_AMBULATORY_CARE_PROVIDER_SITE_OTHER): Payer: Medicare Other | Admitting: Internal Medicine

## 2022-04-07 VITALS — BP 122/68 | HR 67 | Resp 18 | Ht 72.0 in | Wt 183.2 lb

## 2022-04-07 DIAGNOSIS — M1A9XX0 Chronic gout, unspecified, without tophus (tophi): Secondary | ICD-10-CM

## 2022-04-07 DIAGNOSIS — Z7189 Other specified counseling: Secondary | ICD-10-CM | POA: Insufficient documentation

## 2022-04-07 DIAGNOSIS — K5909 Other constipation: Secondary | ICD-10-CM | POA: Insufficient documentation

## 2022-04-07 DIAGNOSIS — E039 Hypothyroidism, unspecified: Secondary | ICD-10-CM | POA: Diagnosis not present

## 2022-04-07 DIAGNOSIS — E782 Mixed hyperlipidemia: Secondary | ICD-10-CM | POA: Diagnosis not present

## 2022-04-07 DIAGNOSIS — I10 Essential (primary) hypertension: Secondary | ICD-10-CM

## 2022-04-07 DIAGNOSIS — G609 Hereditary and idiopathic neuropathy, unspecified: Secondary | ICD-10-CM

## 2022-04-07 DIAGNOSIS — K5904 Chronic idiopathic constipation: Secondary | ICD-10-CM

## 2022-04-07 DIAGNOSIS — I2510 Atherosclerotic heart disease of native coronary artery without angina pectoris: Secondary | ICD-10-CM | POA: Diagnosis not present

## 2022-04-07 DIAGNOSIS — D472 Monoclonal gammopathy: Secondary | ICD-10-CM

## 2022-04-07 MED ORDER — LUBIPROSTONE 24 MCG PO CAPS: 24.0000 ug | ORAL_CAPSULE | Freq: Every day | ORAL | 2 refills | Status: DC

## 2022-04-07 NOTE — Patient Instructions (Addendum)
Okay to take Gabapentin 200 mg instead of 100 mg at bedtime.  Please continue taking medications as prescribed.  Please continue to follow low salt diet and ambulate as tolerated.  Please take Amitiza as prescribed for constipation. Okay to take Colace as stool softener.

## 2022-04-08 LAB — CMP14+EGFR
ALT: 21 IU/L (ref 0–44)
AST: 21 IU/L (ref 0–40)
Albumin/Globulin Ratio: 1.7 (ref 1.2–2.2)
Albumin: 4.3 g/dL (ref 3.7–4.7)
Alkaline Phosphatase: 80 IU/L (ref 44–121)
BUN/Creatinine Ratio: 16 (ref 10–24)
BUN: 15 mg/dL (ref 8–27)
Bilirubin Total: 0.7 mg/dL (ref 0.0–1.2)
CO2: 24 mmol/L (ref 20–29)
Calcium: 9.2 mg/dL (ref 8.6–10.2)
Chloride: 103 mmol/L (ref 96–106)
Creatinine, Ser: 0.94 mg/dL (ref 0.76–1.27)
Globulin, Total: 2.6 g/dL (ref 1.5–4.5)
Glucose: 110 mg/dL — ABNORMAL HIGH (ref 70–99)
Potassium: 4.9 mmol/L (ref 3.5–5.2)
Sodium: 141 mmol/L (ref 134–144)
Total Protein: 6.9 g/dL (ref 6.0–8.5)
eGFR: 80 mL/min/{1.73_m2} (ref >59)

## 2022-04-08 LAB — LIPID PANEL
Chol/HDL Ratio: 4.5 ratio (ref 0.0–5.0)
Cholesterol, Total: 131 mg/dL (ref 100–199)
HDL: 29 mg/dL — ABNORMAL LOW (ref >39)
LDL Chol Calc (NIH): 77 mg/dL (ref 0–99)
Triglycerides: 143 mg/dL (ref 0–149)
VLDL Cholesterol Cal: 25 mg/dL (ref 5–40)

## 2022-04-08 LAB — TSH+FREE T4
Free T4: 1.22 ng/dL (ref 0.82–1.77)
TSH: 2.22 u[IU]/mL (ref 0.450–4.500)

## 2022-04-08 LAB — URIC ACID: Uric Acid: 5 mg/dL (ref 3.8–8.4)

## 2022-04-09 MED ORDER — LINACLOTIDE 145 MCG PO CAPS: 145.0000 ug | ORAL_CAPSULE | Freq: Every day | ORAL | 5 refills | Status: DC

## 2022-04-10 NOTE — Assessment & Plan Note (Signed)
On Gabapentin 100 mg BID, plan to increase dose as tolerated - advised to take Gabapentin 200 mg qHS and 100 mg QAM Advised to take Vit B12 1000 mcg QD Has been evaluated by neurology

## 2022-04-10 NOTE — Assessment & Plan Note (Signed)
CABG in 12/98; DES to the RCA SVG in 07/2002; restenosis SVG stent and re-intervention in 2006; 70% residual D1; cath in 11/2007: TO SVG M1  Followed by Cardiology On Aspirin, statin and Coreg Denies any anginal symptoms currently 

## 2022-04-10 NOTE — Assessment & Plan Note (Signed)
On Levothyroxine 50 mcg QD Last TSH - wnl 

## 2022-04-10 NOTE — Progress Notes (Signed)
Established Patient Office Visit  Subjective:  Patient ID: Jorge Bishop, male    DOB: 04-16-1936  Age: 86 y.o. MRN: [id number]  CC:  Chief Complaint  Patient presents with   Follow-up    Follow up CAD and hypothyroidism pt having trouble with neuropathy tingling from knees down     HPI Jorge Bishop is a 86 y.o. male with past medical history of CAD s/p CABG, hypertension, HLD, prediabetes, hypothyroidism, MGUS, GERD, lumbar spinal stenosis s/p lumbar laminectomy and BPH who presents for f/u of his chronic medical conditions.  CAD s/p CABG and hypertension: BP is well-controlled. Takes medications regularly. Patient denies headache, dizziness, chest pain, dyspnea or palpitations.  He follows up with cardiology.  He is on Coreg, aspirin and atorvastatin.   MGUS: He was diagnosed with MGUS, and had a skeletal survey done, which was benign.  Followed by oncology currently.  Neuropathy: He was started on gabapentin, but he felt drowsiness with 300 mg dose.  He was later placed on gabapentin 100 mg dose, which he has slowly up titrated to 100 mg twice daily.  He still complains of burning pain in his legs and feet.  Hypothyroidism: Takes levothyroxine.  Denies any recent change in weight or appetite.  Chronic constipation: He reports chronic constipation.  He denies any melena or hematochezia.  He has to strain hard while passing stool.  He has tried OTC stool softener-Dulcolax, MiraLAX, fiber supplement/Metamucil and probiotics without relief.  He has had to do manual disimpaction once as well.  Past Medical History:  Diagnosis Date   Arteriosclerotic cardiovascular disease (ASCVD)    Coronary artery bypass graft surgery in 12/98; DES to the RCA SVG in 07/2002; restenosis of the saphenous vein graft stent and renal intervention in 2006; 70% first diagonal present; calf in 11/2007-total obstruction of the saphenous vein graft to the first marginal   Benign prostatic hypertrophy     Cerebrovascular disease    60% bilateral internal carotid artery stenosis in 2012   Coronary artery disease    4 stents   Diaphragmatic hernia 01/15/2021   Diverticulosis    DJD (degenerative joint disease)    Erectile dysfunction    GERD (gastroesophageal reflux disease)    History of blood transfusion    History of kidney stones    Hyperlipidemia    Lipid profile in 12/2009:104, 91, 36, 50   Hypertension    Kidney stone 09/08/2017   Migraine headache    Right bundle branch block    Right ureteral stone 03/08/2019   Shortness of breath    Thrombocytopenia (HCC)    platelets of 104 in 03/2009   Upper gastrointestinal hemorrhage 78/29    Helicobacter pylori positive; subsequent laparoscopic fundoplication-2009    Past Surgical History:  Procedure Laterality Date   CARDIAC CATHETERIZATION     4 cardiac stents   CATARACT EXTRACTION W/PHACO Right 07/25/2012   Procedure: CATARACT EXTRACTION PHACO AND INTRAOCULAR LENS PLACEMENT ([place]);  Surgeon: Tonny Branch, MD;  Location: [facility];  Service: Ophthalmology;  Laterality: Right;  CDE:23.31   CATARACT EXTRACTION W/PHACO Left 08/18/2012   Procedure: CATARACT EXTRACTION PHACO AND INTRAOCULAR LENS PLACEMENT ([facility]);  Surgeon: Tonny Branch, MD;  Location: [facility];  Service: Ophthalmology;  Laterality: Left;  CDE: 16.07   COLONOSCOPY N/A 08/01/2014   Procedure: COLONOSCOPY;  Surgeon: Rogene Houston, MD;  Location: [facility];  Service: Endoscopy;  Laterality: N/A;  125 - moved to 10:30 - Ann to notify pt  CORONARY ARTERY BYPASS GRAFT  1998    CYSTOSCOPY/URETEROSCOPY/HOLMIUM LASER/STENT PLACEMENT Right 11/12/2017   Procedure: CYSTOSCOPY/RIGHT RETROGRADE PYELOGRAM/RIGHT URETEROSCOPY/HOLMIUM LASER LITHOTRIPSY RIGHT URETERAL CALCULUS/RIGHT URETERAL STENT PLACEMENT;  Surgeon: [name], MD;  Location: [facility];  Service: Urology;  Laterality: Right;  right ureteral calculus given to patient's wife per Dr. [name]   CYSTOSCOPY/URETEROSCOPY/HOLMIUM LASER/STENT  PLACEMENT Right 03/07/2019   Procedure: CYSTOSCOPY RIGHT RETROGRADE RIGHT URETEROSCOPY;  Surgeon: [name], MD;  Location: [facility];  Service: Urology;  Laterality: Right;   EXTRACORPOREAL SHOCK WAVE LITHOTRIPSY Right 09/13/2017   Procedure: RIGHT EXTRACORPOREAL SHOCK WAVE LITHOTRIPSY (ESWL);  Surgeon: Lucas Mallow, MD;  Location: [facility];  Service: Urology;  Laterality: Right;   HERNIA REPAIR     right inguinal   LAPAROSCOPIC NISSEN FUNDOPLICATION  2725, 3664    revision in 2009 along with repair of paraesophagel hernia    PARAESOPHAGEAL HERNIA REPAIR     TRANSURETHRAL RESECTION OF PROSTATE N/A 03/07/2019   Procedure: TRANSURETHRAL RESECTION OF THE PROSTATE (TURP);  Surgeon: [name], MD;  Location: [facility];  Service: Urology;  Laterality: N/A;    Family History  Problem Relation Age of Onset   CAD Father     Social History   Socioeconomic History   Marital status: Married    Spouse name: Not on file   Number of children: Not on file   Years of education: Not on file   Highest education level: Not on file  Occupational History   Occupation: Full time    Employer: RETIRED  Tobacco Use   Smoking status: Never   Smokeless tobacco: Never  Vaping Use   Vaping Use: Never used  Substance and Sexual Activity   Alcohol use: No    Alcohol/week: 0.0 standard drinks of alcohol   Drug use: No   Sexual activity: Yes    Birth control/protection: None  Other Topics Concern   Not on file  Social History Narrative   Right handed   Drinks caffeine   Two story home   Social Determinants of Health   Financial Resource Strain: Low Risk  (02/17/2022)   Overall Financial Resource Strain (CARDIA)    Difficulty of Paying Living Expenses: Not hard at all  Food Insecurity: No Food Insecurity (02/17/2022)   Hunger Vital Sign    Worried About Running Out of Food in the Last Year: Never true    [place] in the Last Year: Never true  Transportation Needs: No Transportation Needs  (02/17/2022)   PRAPARE - Hydrologist (Medical): No    Lack of Transportation (Non-Medical): No  Physical Activity: Sufficiently Active (02/17/2022)   Exercise Vital Sign    Days of Exercise per Week: 5 days    Minutes of Exercise per Session: 60 min  Stress: No Stress Concern Present (02/17/2022)   [employer]    Feeling of Stress : Not at all  Social Connections: Moderately Isolated (02/17/2022)   Social Connection and Isolation Panel [NHANES]    Frequency of Communication with Friends and Family: More than three times a week    Frequency of Social Gatherings with Friends and Family: More than three times a week    Attends Religious Services: Never    Marine scientist or Organizations: No    Attends Archivist Meetings: Never    Marital Status: Married  Human resources officer Violence: Unknown (02/17/2022)   Humiliation, Afraid, Rape,  and Kick questionnaire    Fear of Current or Ex-Partner: No    Emotionally Abused: Not on file    Physically Abused: No    Sexually Abused: No    Outpatient Medications Prior to Visit  Medication Sig Dispense Refill   aspirin 81 MG tablet Take 81 mg by mouth daily.       atorvastatin (LIPITOR) 20 MG tablet Take 20 mg by mouth at bedtime.       carvedilol (COREG) 6.25 MG tablet Take 1 tablet (6.25 mg total) by mouth 2 (two) times daily with a meal. 180 tablet 3   Cholecalciferol (VITAMIN D3) 50 MCG (2000 UT) TABS Take 10,000 Units by mouth daily.      cyanocobalamin 1000 MCG tablet Take 1,000 mcg by mouth daily.     FIBER PO Take 2 capsules by mouth daily as needed (constipation).     fluorometholone (FML) 0.1 % ophthalmic suspension SMARTSIG:1 Drop(s) In Eye(s) 2-4 Times Daily PRN     gabapentin (NEURONTIN) 100 MG capsule Take 1 capsule (100 mg total) by mouth 3 (three) times daily. 90 capsule 5   ondansetron (ZOFRAN) 4 MG tablet Take 1 tablet (4 mg total)  by mouth every 8 (eight) hours as needed for nausea or vomiting. 90 tablet 1   pantoprazole (PROTONIX) 20 MG tablet TAKE 1 TABLET DAILY 90 tablet 0   promethazine (PHENERGAN) 25 MG tablet Take 25 mg by mouth every 6 (six) hours as needed for nausea or vomiting.     SYNTHROID 50 MCG tablet Take 50 mcg by mouth daily.     traMADol (ULTRAM) 50 MG tablet Take 50 mg by mouth every 6 (six) hours as needed (pain).     nitroGLYCERIN (NITROSTAT) 0.4 MG SL tablet Place 1 tablet (0.4 mg total) under the tongue every 5 (five) minutes as needed for chest pain. 90 tablet 3   No facility-administered medications prior to visit.    Allergies  Allergen Reactions   Bactrim [Sulfamethoxazole-Trimethoprim] Anaphylaxis   Clarithromycin Anaphylaxis    REACTION: UNKNOWN REACTION   Codeine Nausea And Vomiting   Erythromycin Anaphylaxis   Sulfa Antibiotics Anaphylaxis   Trimethoprim     ROS Review of Systems  Constitutional:  Negative for chills and fever.  HENT:  Negative for congestion and sore throat.   Eyes:  Negative for pain and discharge.  Respiratory:  Negative for cough and shortness of breath.   Cardiovascular:  Negative for chest pain and palpitations.  Gastrointestinal:  Positive for constipation. Negative for diarrhea, nausea and vomiting.  Endocrine: Negative for polydipsia and polyuria.  Genitourinary:  Negative for dysuria and hematuria.  Musculoskeletal:  Positive for arthralgias, back pain and gait problem. Negative for neck pain and neck stiffness.  Skin:  Negative for rash.  Neurological:  Positive for numbness. Negative for dizziness, weakness and headaches.  Psychiatric/Behavioral:  Negative for agitation and behavioral problems.       Objective:    Physical Exam Vitals reviewed.  Constitutional:      General: He is not in acute distress.    Appearance: He is not diaphoretic.  HENT:     Head: Normocephalic and atraumatic.     Nose: Nose normal.     Mouth/Throat:      Mouth: Mucous membranes are moist.  Eyes:     General: No scleral icterus.    Extraocular Movements: Extraocular movements intact.  Cardiovascular:     Rate and Rhythm: Normal rate and regular rhythm.  Pulses: Normal pulses.     Heart sounds: Normal heart sounds. No murmur heard. Pulmonary:     Breath sounds: Normal breath sounds. No wheezing or rales.  Abdominal:     Palpations: Abdomen is soft.     Tenderness: There is no abdominal tenderness.  Musculoskeletal:     Cervical back: Neck supple. No tenderness.     Right lower leg: No edema.     Left lower leg: No edema.  Skin:    General: Skin is warm and dry.     Findings: Bruising (B/l UE) present.  Neurological:     General: No focal deficit present.     Mental Status: He is alert and oriented to person, place, and time.     Sensory: Sensory deficit (B/l feet) present.     Motor: Weakness (B/l LE) present.     Gait: Gait abnormal.  Psychiatric:        Mood and Affect: Mood normal.        Behavior: Behavior normal.     BP 122/68 (BP Location: Right Arm, Patient Position: Sitting, Cuff Size: Normal)   Pulse 67   Resp 18   Ht 6' (1.829 m)   Wt 183 lb 3.2 oz (83.1 kg)   SpO2 95%   BMI 24.85 kg/m  Wt Readings from Last 3 Encounters:  04/07/22 183 lb 3.2 oz (83.1 kg)  01/27/22 183 lb (83 kg)  12/10/21 185 lb (83.9 kg)    Lab Results  Component Value Date   TSH 2.220 04/07/2022   Lab Results  Component Value Date   WBC 5.2 01/20/2022   HGB 14.9 01/20/2022   HCT 46.8 01/20/2022   MCV 90.3 01/20/2022   PLT 129 (L) 01/20/2022   Lab Results  Component Value Date   NA 141 04/07/2022   K 4.9 04/07/2022   CO2 24 04/07/2022   GLUCOSE 110 (H) 04/07/2022   BUN 15 04/07/2022   CREATININE 0.94 04/07/2022   BILITOT 0.7 04/07/2022   ALKPHOS 80 04/07/2022   AST 21 04/07/2022   ALT 21 04/07/2022   PROT 6.9 04/07/2022   ALBUMIN 4.3 04/07/2022   CALCIUM 9.2 04/07/2022   ANIONGAP 4 (L) 01/20/2022   EGFR 80  04/07/2022   Lab Results  Component Value Date   CHOL 131 04/07/2022   Lab Results  Component Value Date   HDL 29 (L) 04/07/2022   Lab Results  Component Value Date   LDLCALC 77 04/07/2022   Lab Results  Component Value Date   TRIG 143 04/07/2022   Lab Results  Component Value Date   CHOLHDL 4.5 04/07/2022   Lab Results  Component Value Date   HGBA1C 5.9 (H) 09/15/2021      Assessment & Plan:   Problem List Items Addressed This Visit       Cardiovascular and Mediastinum   Hypertension - Primary    BP Readings from Last 1 Encounters:  04/07/22 122/68  Overall well-controlled with Coreg, considering his age 16 for compliance with the medications Advised DASH diet and moderate exercise/walking as tolerated      Relevant Orders   CMP14+EGFR (Completed)   Lipid Profile (Completed)   CAD (coronary artery disease)    CABG in 12/98; DES to the RCA SVG in 07/2002; restenosis SVG stent and re-intervention in 2006; 70% residual D1; cath in 11/2007: TO SVG M1  Followed by Cardiology On Aspirin, statin and Coreg Denies any anginal symptoms currently      Relevant  Orders   CMP14+EGFR (Completed)   Lipid Profile (Completed)     Digestive   Chronic constipation    Chronic idiopathic constipation Has tried multiple OTC laxatives and stool softeners without much relief Also takes prunes Will try Linzess Advised to maintain adequate hydration      Relevant Medications   linaclotide (LINZESS) 145 MCG CAPS capsule     Endocrine   Hypothyroidism    On Levothyroxine 50 mcg QD Last TSH - wnl      Relevant Orders   TSH + free T4 (Completed)     Nervous and Auditory   Idiopathic peripheral neuropathy    On Gabapentin 100 mg BID, plan to increase dose as tolerated - advised to take Gabapentin 200 mg qHS and 100 mg QAM Advised to take Vit B12 1000 mcg QD Has been evaluated by neurology        Musculoskeletal and Integument   Chronic gout involving toe  of right foot without tophus    Was evaluated by podiatry for chronic foot pain Was advised to get uric acid level checked for gout His symptoms are not consistent with gout currently, but will check uric acid level to rule out gout      Relevant Orders   Uric acid (Completed)     Other   Hyperlipidemia    On statin Check lipid profile      MGUS (monoclonal gammopathy of unknown significance)    Followed by oncology       Meds ordered this encounter  Medications   DISCONTD: lubiprostone (AMITIZA) 24 MCG capsule    Sig: Take 1 capsule (24 mcg total) by mouth daily with breakfast.    Dispense:  30 capsule    Refill:  2   linaclotide (LINZESS) 145 MCG CAPS capsule    Sig: Take 1 capsule (145 mcg total) by mouth daily before breakfast.    Dispense:  30 capsule    Refill:  5    Follow-up: Return in about 6 months (around 10/07/2022) for CAD and hypothyroidism.    Lindell Spar, MD

## 2022-04-10 NOTE — Assessment & Plan Note (Signed)
Chronic idiopathic constipation Has tried multiple OTC laxatives and stool softeners without much relief Also takes prunes Will try Linzess Advised to maintain adequate hydration

## 2022-04-10 NOTE — Assessment & Plan Note (Signed)
On statin Check lipid profile 

## 2022-04-10 NOTE — Assessment & Plan Note (Signed)
Was evaluated by podiatry for chronic foot pain Was advised to get uric acid level checked for gout His symptoms are not consistent with gout currently, but will check uric acid level to rule out gout

## 2022-04-10 NOTE — Assessment & Plan Note (Signed)
Followed by oncology 

## 2022-04-10 NOTE — Assessment & Plan Note (Addendum)
BP Readings from Last 1 Encounters:  04/07/22 122/68   Overall well-controlled with Coreg, considering his age 86 for compliance with the medications Advised DASH diet and moderate exercise/walking as tolerated

## 2022-04-13 ENCOUNTER — Other Ambulatory Visit: Payer: Self-pay | Admitting: Internal Medicine

## 2022-04-13 DIAGNOSIS — K5904 Chronic idiopathic constipation: Secondary | ICD-10-CM

## 2022-04-13 MED ORDER — LACTULOSE 20 GM/30ML PO SOLN: 30.0000 mL | Freq: Every day | ORAL | 1 refills | Status: DC | PRN

## 2022-05-25 ENCOUNTER — Telehealth: Payer: Self-pay | Admitting: Orthopedic Surgery

## 2022-05-25 NOTE — Telephone Encounter (Signed)
Patient presented to the office wanting to schedule an appointment for multiple things, but decided on bil knees.  He had surgery three years ago by Dr. Paralee Cancel at [facility].  Advised him that we need Dr. Aurea Graff notes, surgical notes and the x-rays on a CD for Dr. Amedeo Kinsman to review before we can schedule.  Patient understood.

## 2022-06-01 ENCOUNTER — Telehealth: Payer: Self-pay | Admitting: Internal Medicine

## 2022-06-01 ENCOUNTER — Other Ambulatory Visit: Payer: Self-pay

## 2022-06-01 DIAGNOSIS — K5904 Chronic idiopathic constipation: Secondary | ICD-10-CM

## 2022-06-01 MED ORDER — LACTULOSE 20 GM/30ML PO SOLN: 30.0000 mL | Freq: Every day | ORAL | 1 refills | Status: DC | PRN

## 2022-06-01 NOTE — Telephone Encounter (Signed)
Patient called, would like to change his pharmacy from [facility] to [facility].  Pt's # [phone]

## 2022-07-24 ENCOUNTER — Telehealth: Payer: Self-pay | Admitting: Internal Medicine

## 2022-07-24 NOTE — Telephone Encounter (Signed)
Pt came in office and stated that he was wanting to know if it would be safe for him as a heart patient and cancer patient to have the RVS immunization. He said MyChart communication is best for him.

## 2022-07-27 ENCOUNTER — Inpatient Hospital Stay: Payer: Medicare Other | Attending: Hematology

## 2022-07-27 ENCOUNTER — Ambulatory Visit (HOSPITAL_COMMUNITY)
Admission: RE | Admit: 2022-07-27 | Discharge: 2022-07-27 | Disposition: A | Discharge status: Home / Self Care | Payer: Medicare Other | Source: Ambulatory Visit | Attending: Hematology | Admitting: Hematology

## 2022-07-27 DIAGNOSIS — Z79899 Other long term (current) drug therapy: Secondary | ICD-10-CM | POA: Insufficient documentation

## 2022-07-27 DIAGNOSIS — D472 Monoclonal gammopathy: Secondary | ICD-10-CM | POA: Diagnosis present

## 2022-07-27 DIAGNOSIS — G629 Polyneuropathy, unspecified: Secondary | ICD-10-CM | POA: Insufficient documentation

## 2022-07-27 DIAGNOSIS — D696 Thrombocytopenia, unspecified: Secondary | ICD-10-CM | POA: Insufficient documentation

## 2022-07-27 LAB — CBC WITH DIFFERENTIAL/PLATELET
Abs Immature Granulocytes: 0.01 10*3/uL (ref 0.00–0.07)
Basophils Absolute: 0 10*3/uL (ref 0.0–0.1)
Basophils Relative: 1 %
Eosinophils Absolute: 0.2 10*3/uL (ref 0.0–0.5)
Eosinophils Relative: 3 %
HCT: 48.8 % (ref 39.0–52.0)
Hemoglobin: 15.6 g/dL (ref 13.0–17.0)
Immature Granulocytes: 0 %
Lymphocytes Relative: 31 %
Lymphs Abs: 1.7 10*3/uL (ref 0.7–4.0)
MCH: 29.2 pg (ref 26.0–34.0)
MCHC: 32 g/dL (ref 30.0–36.0)
MCV: 91.2 fL (ref 80.0–100.0)
Monocytes Absolute: 0.6 10*3/uL (ref 0.1–1.0)
Monocytes Relative: 10 %
Neutro Abs: 3.1 10*3/uL (ref 1.7–7.7)
Neutrophils Relative %: 55 %
Platelets: 112 10*3/uL — ABNORMAL LOW (ref 150–400)
RBC: 5.35 MIL/uL (ref 4.22–5.81)
RDW: 13.2 % (ref 11.5–15.5)
WBC: 5.6 10*3/uL (ref 4.0–10.5)
nRBC: 0 % (ref 0.0–0.2)

## 2022-07-27 LAB — COMPREHENSIVE METABOLIC PANEL
ALT: 24 U/L (ref 0–44)
AST: 25 U/L (ref 15–41)
Albumin: 4.1 g/dL (ref 3.5–5.0)
Alkaline Phosphatase: 62 U/L (ref 38–126)
Anion gap: 6 (ref 5–15)
BUN: 21 mg/dL (ref 8–23)
CO2: 28 mmol/L (ref 22–32)
Calcium: 9.4 mg/dL (ref 8.9–10.3)
Chloride: 104 mmol/L (ref 98–111)
Creatinine, Ser: 0.99 mg/dL (ref 0.61–1.24)
GFR, Estimated: >60 mL/min (ref >60)
Glucose, Bld: 113 mg/dL — ABNORMAL HIGH (ref 70–99)
Potassium: 4.9 mmol/L (ref 3.5–5.1)
Sodium: 138 mmol/L (ref 135–145)
Total Bilirubin: 1.4 mg/dL — ABNORMAL HIGH (ref 0.3–1.2)
Total Protein: 7.2 g/dL (ref 6.5–8.1)

## 2022-07-27 LAB — LACTATE DEHYDROGENASE: LDH: 163 U/L (ref 98–192)

## 2022-07-27 NOTE — Telephone Encounter (Signed)
I spoke with patient and relayed Dr.Mallipeddi's message. Patient was thankful for info.

## 2022-07-27 NOTE — Telephone Encounter (Signed)
Returned call to pt. No answer, left msg to call back. °

## 2022-07-30 LAB — PROTEIN ELECTROPHORESIS, SERUM
A/G Ratio: 1.4 (ref 0.7–1.7)
Albumin ELP: 3.8 g/dL (ref 2.9–4.4)
Alpha-1-Globulin: 0.2 g/dL (ref 0.0–0.4)
Alpha-2-Globulin: 0.6 g/dL (ref 0.4–1.0)
Beta Globulin: 1 g/dL (ref 0.7–1.3)
Gamma Globulin: 1 g/dL (ref 0.4–1.8)
Globulin, Total: 2.8 g/dL (ref 2.2–3.9)
M-Spike, %: 0.5 g/dL — ABNORMAL HIGH
Total Protein ELP: 6.6 g/dL (ref 6.0–8.5)

## 2022-07-31 LAB — KAPPA/LAMBDA LIGHT CHAINS
Kappa free light chain: 41.4 mg/L — ABNORMAL HIGH (ref 3.3–19.4)
Kappa, lambda light chain ratio: 2.31 — ABNORMAL HIGH (ref 0.26–1.65)
Lambda free light chains: 17.9 mg/L (ref 5.7–26.3)

## 2022-08-01 NOTE — Progress Notes (Unsigned)
[place], [place] [address]   CLINIC:  Medical Oncology/Hematology  PCP:  Lindell Spar, MD [address] [place] [address] [phone]   REASON FOR VISIT:  Follow-up for MGUS  PRIOR THERAPY: None  CURRENT THERAPY: Active surveillance  INTERVAL HISTORY:   Mr. Himmelberg 87 y.o. male returns for routine follow-up of MGUS.  He was last seen by Dr. Delton Coombes on 01/27/2022.  At today's visit, he reports feeling fairly well.  No recent hospitalizations, surgeries, or changes in baseline health status.  He continues to have chronic peripheral neuropathy, reports that he stopped taking gabapentin.  He reports some pain in his left hip, left shoulder, and right wrist.  He denies any recent fractures.  He denies any B symptoms such as fever, chills, night sweats, unintentional weight loss. He has some chronic tinnitus, hearing loss, and blurry vision; denies any acute neurologic changes.  He denies any new masses or lymphadenopathy.  Regarding his thrombocytopenia, he reports that he has had low platelets since around 2009 when he was hospitalized for bleeding ulcer and H. pylori.  He reports that his thrombocytopenia has never been worked up, which concerns him.  He reports that he bruises and bleeds easily.  He reports that his gums will bleed when he "brushes hard."  He has some pinkish discoloration to his sputum in the morning; no frank epistaxis or hemoptysis.  He has 70% energy and 40% appetite. He endorses that he is maintaining a stable weight.  ASSESSMENT & PLAN:  1.  IgG kappa MGUS - Worked up by neurology (Dr. Narda Amber) for lower extremity neuropathy and worsening balance problems, which showed M spike positive - Hematology workup (09/03/2021): Immunofixation with IgG kappa monoclonal protein and SPEP 0.4. - Most recent skeletal survey (07/28/2022): 7 mm sclerotic lesion in mid left humerus appears new from prior.  No other  sclerotic or lytic osseous lesions are identified. - Most recent MGUS/myeloma panel (07/27/2022) M spike 0.5% Light chains essentially stable (elevated kappa 41.4, normal lambda, elevated ratio 2.31) No CRAB features: Hemoglobin 15.6, creatinine 0.99, calcium 9.4.  LDH normal. - Continues to report chronic neuropathy, balance issues, tinnitus, hearing loss, and blurry vision. - He reports pain around his left hip, left shoulder, and right wrist.  (Skeletal survey negative for any lytic lesions at these locations) - Reports 30 pound weight loss over the past 2 years due to decreased appetite.  He has lost about 6 pounds in the past 4 months. - PLAN: Check follow-up x-ray of left humerus in 3 months for follow-up of sclerotic lesion  - Next MGUS/myeloma panel due August 2024. - Full body skeletal survey due February 2025.  2.  Thrombocytopenia - Patient reports that he has had low platelets since around 2009, when he was hospitalized for bleeding ulcer and H. pylori. -Reports easy bruising and bleeding.  He reports that his gums will bleed when he "brushes hard."  He has some pinkish discoloration to his sputum in the morning; no frank epistaxis or hemoptysis. - CT abdomen/pelvis (renal study) from 03/24/2019 showed normal spleen size  - No masses or lymphadenopathy on exam  - PLAN: We will check labs for workup of thrombocytopenia including nutritional labs, infectious markers, and inflammatory markers.  [check labs in about 3 months, same time as humerus x-ray to coordinate with follow-up visit] - If above workup is negative, would consider that this may represent pseudothrombocytopenia, chronic ITP, or bone marrow infiltrative  process.  We would consider bone marrow biopsy if development of any B symptoms or major deviation from baseline.    3.  Peripheral neuropathy - Lower extremity neuropathy for the past several years - Taking gabapentin 100 mg daily, unable to tolerate higher doses due to  drowsiness  4.  Other history - Lives at home with his wife.  He worked as a Psychologist, occupational at cardiac rehab until 2 years ago.  He is retired from [employer].  He was exposed to agent orange in [place].  He is a non-smoker.   PLAN SUMMARY: >> Labs in 3 months = CBC/D, LDH, Vitamin B12, MMA, folate, copper, ANA, RF, hepatitis B surface antigen, hepatitis B surface antibody, hepatitis B core antibody, hepatitis C antibody with reflex to PCR, HIV antibody, H. pylori antibody, immature platelet fraction >> X-ray left humerus in 3 months  >> OFFICE visit in 3 months (1 week after labs/x-ray)     REVIEW OF SYSTEMS:   Review of Systems  Constitutional:  Positive for fatigue. Negative for appetite change, chills, diaphoresis, fever and unexpected weight change.  HENT:   Positive for trouble swallowing. Negative for lump/mass and nosebleeds.   Eyes:  Negative for eye problems.  Respiratory:  Positive for shortness of breath. Negative for cough and hemoptysis.   Cardiovascular:  Negative for chest pain, leg swelling and palpitations.  Gastrointestinal:  Positive for constipation. Negative for abdominal pain, blood in stool, diarrhea, nausea and vomiting.  Genitourinary:  Negative for hematuria.   Musculoskeletal:  Positive for arthralgias.  Skin: Negative.   Neurological:  Positive for dizziness, headaches and numbness. Negative for light-headedness.  Hematological:  Does not bruise/bleed easily.     PHYSICAL EXAM:  ECOG PERFORMANCE STATUS: 1 - Symptomatic but completely ambulatory  There were no vitals filed for this visit. There were no vitals filed for this visit. Physical Exam Vitals reviewed.  Constitutional:      Appearance: Normal appearance.  Cardiovascular:     Rate and Rhythm: Regular rhythm.     Heart sounds: Normal heart sounds.  Pulmonary:     Breath sounds: Normal breath sounds.  Skin:    Findings: Bruising present.  Neurological:     Mental Status: He is alert.   Psychiatric:        Mood and Affect: Mood normal.        Behavior: Behavior normal.    PAST MEDICAL/SURGICAL HISTORY:  Past Medical History:  Diagnosis Date   Arteriosclerotic cardiovascular disease (ASCVD)    Coronary artery bypass graft surgery in 12/98; DES to the RCA SVG in 07/2002; restenosis of the saphenous vein graft stent and renal intervention in 2006; 70% first diagonal present; calf in 11/2007-total obstruction of the saphenous vein graft to the first marginal   Benign prostatic hypertrophy    Cerebrovascular disease    60% bilateral internal carotid artery stenosis in 2012   Coronary artery disease    4 stents   Diaphragmatic hernia 01/15/2021   Diverticulosis    DJD (degenerative joint disease)    Erectile dysfunction    GERD (gastroesophageal reflux disease)    History of blood transfusion    History of kidney stones    Hyperlipidemia    Lipid profile in 12/2009:104, 91, 36, 50   Hypertension    Kidney stone 09/08/2017   Migraine headache    Right bundle branch block    Right ureteral stone 03/08/2019   Shortness of breath    Thrombocytopenia ([place])  platelets of 104 in 03/2009   Upper gastrointestinal hemorrhage [id number]    Helicobacter pylori positive; subsequent laparoscopic fundoplication-2009   Past Surgical History:  Procedure Laterality Date   CARDIAC CATHETERIZATION     4 cardiac stents   CATARACT EXTRACTION W/PHACO Right 07/25/2012   Procedure: CATARACT EXTRACTION PHACO AND INTRAOCULAR LENS PLACEMENT ([place]);  Surgeon: Tonny Branch, MD;  Location: [facility];  Service: Ophthalmology;  Laterality: Right;  CDE:23.31   CATARACT EXTRACTION W/PHACO Left 08/18/2012   Procedure: CATARACT EXTRACTION PHACO AND INTRAOCULAR LENS PLACEMENT ([facility]);  Surgeon: Tonny Branch, MD;  Location: [facility];  Service: Ophthalmology;  Laterality: Left;  CDE: 16.07   COLONOSCOPY N/A 08/01/2014   Procedure: COLONOSCOPY;  Surgeon: Rogene Houston, MD;  Location: [facility];  Service: Endoscopy;   Laterality: N/A;  125 - moved to 10:30 - Ann to notify pt   CORONARY ARTERY BYPASS GRAFT  1998    CYSTOSCOPY/URETEROSCOPY/HOLMIUM LASER/STENT PLACEMENT Right 11/12/2017   Procedure: CYSTOSCOPY/RIGHT RETROGRADE PYELOGRAM/RIGHT URETEROSCOPY/HOLMIUM LASER LITHOTRIPSY RIGHT URETERAL CALCULUS/RIGHT URETERAL STENT PLACEMENT;  Surgeon: [name], MD;  Location: [facility];  Service: Urology;  Laterality: Right;  right ureteral calculus given to patient's wife per Dr. [name]   CYSTOSCOPY/URETEROSCOPY/HOLMIUM LASER/STENT PLACEMENT Right 03/07/2019   Procedure: CYSTOSCOPY RIGHT RETROGRADE RIGHT URETEROSCOPY;  Surgeon: [name], MD;  Location: [facility];  Service: Urology;  Laterality: Right;   EXTRACORPOREAL SHOCK WAVE LITHOTRIPSY Right 09/13/2017   Procedure: RIGHT EXTRACORPOREAL SHOCK WAVE LITHOTRIPSY (ESWL);  Surgeon: Lucas Mallow, MD;  Location: [facility];  Service: Urology;  Laterality: Right;   HERNIA REPAIR     right inguinal   LAPAROSCOPIC NISSEN FUNDOPLICATION  0000000, [id number]    revision in 2009 along with repair of paraesophagel hernia    PARAESOPHAGEAL HERNIA REPAIR     TRANSURETHRAL RESECTION OF PROSTATE N/A 03/07/2019   Procedure: TRANSURETHRAL RESECTION OF THE PROSTATE (TURP);  Surgeon: [name], MD;  Location: [facility];  Service: Urology;  Laterality: N/A;    SOCIAL HISTORY:  Social History   Socioeconomic History   Marital status: Married    Spouse name: Not on file   Number of children: Not on file   Years of education: Not on file   Highest education level: Not on file  Occupational History   Occupation: Full time    Employer: RETIRED  Tobacco Use   Smoking status: Never   Smokeless tobacco: Never  Vaping Use   Vaping Use: Never used  Substance and Sexual Activity   Alcohol use: No    Alcohol/week: 0.0 standard drinks of alcohol   Drug use: No   Sexual activity: Yes    Birth control/protection: None  Other Topics Concern   Not on file  Social History Narrative   Right  handed   Drinks caffeine   Two story home   Social Determinants of Health   Financial Resource Strain: Low Risk  (02/17/2022)   Overall Financial Resource Strain (CARDIA)    Difficulty of Paying Living Expenses: Not hard at all  Food Insecurity: No Food Insecurity (02/17/2022)   Hunger Vital Sign    Worried About Running Out of Food in the Last Year: Never true    [place] in the Last Year: Never true  Transportation Needs: No Transportation Needs (02/17/2022)   PRAPARE - Hydrologist (Medical): No    Lack of Transportation (Non-Medical): No  Physical Activity: Sufficiently Active (02/17/2022)   Exercise Vital Sign  Days of Exercise per Week: 5 days    Minutes of Exercise per Session: 60 min  Stress: No Stress Concern Present (02/17/2022)   [place]    Feeling of Stress : Not at all  Social Connections: Moderately Isolated (02/17/2022)   Social Connection and Isolation Panel [NHANES]    Frequency of Communication with Friends and Family: More than three times a week    Frequency of Social Gatherings with Friends and Family: More than three times a week    Attends Religious Services: Never    Marine scientist or Organizations: No    Attends Archivist Meetings: Never    Marital Status: Married  Human resources officer Violence: Unknown (02/17/2022)   Humiliation, Afraid, Rape, and Kick questionnaire    Fear of Current or Ex-Partner: No    Emotionally Abused: Not on file    Physically Abused: No    Sexually Abused: No    FAMILY HISTORY:  Family History  Problem Relation Age of Onset   CAD Father     CURRENT MEDICATIONS:  Outpatient Encounter Medications as of 08/03/2022  Medication Sig   aspirin 81 MG tablet Take 81 mg by mouth daily.     atorvastatin (LIPITOR) 20 MG tablet Take 20 mg by mouth at bedtime.     carvedilol (COREG) 6.25 MG tablet Take 1 tablet (6.25 mg  total) by mouth 2 (two) times daily with a meal.   Cholecalciferol (VITAMIN D3) 50 MCG (2000 UT) TABS Take 10,000 Units by mouth daily.    cyanocobalamin 1000 MCG tablet Take 1,000 mcg by mouth daily.   FIBER PO Take 2 capsules by mouth daily as needed (constipation).   fluorometholone (FML) 0.1 % ophthalmic suspension SMARTSIG:1 Drop(s) In Eye(s) 2-4 Times Daily PRN   gabapentin (NEURONTIN) 100 MG capsule Take 1 capsule (100 mg total) by mouth 3 (three) times daily.   Lactulose 20 GM/30ML SOLN Take 30 mLs (20 g total) by mouth daily as needed (Constipation).   linaclotide (LINZESS) 145 MCG CAPS capsule Take 1 capsule (145 mcg total) by mouth daily before breakfast.   nitroGLYCERIN (NITROSTAT) 0.4 MG SL tablet Place 1 tablet (0.4 mg total) under the tongue every 5 (five) minutes as needed for chest pain.   ondansetron (ZOFRAN) 4 MG tablet Take 1 tablet (4 mg total) by mouth every 8 (eight) hours as needed for nausea or vomiting.   pantoprazole (PROTONIX) 20 MG tablet TAKE 1 TABLET DAILY   promethazine (PHENERGAN) 25 MG tablet Take 25 mg by mouth every 6 (six) hours as needed for nausea or vomiting.   SYNTHROID 50 MCG tablet Take 50 mcg by mouth daily.   traMADol (ULTRAM) 50 MG tablet Take 50 mg by mouth every 6 (six) hours as needed (pain).   No facility-administered encounter medications on file as of 08/03/2022.    ALLERGIES:  Allergies  Allergen Reactions   Bactrim [Sulfamethoxazole-Trimethoprim] Anaphylaxis   Clarithromycin Anaphylaxis    REACTION: UNKNOWN REACTION   Codeine Nausea And Vomiting   Erythromycin Anaphylaxis   Sulfa Antibiotics Anaphylaxis   Trimethoprim     LABORATORY DATA:  I have reviewed the labs as listed.  CBC    Component Value Date/Time   WBC 5.6 07/27/2022 1430   RBC 5.35 07/27/2022 1430   HGB 15.6 07/27/2022 1430   HGB 15.2 09/15/2021 1135   HCT 48.8 07/27/2022 1430   HCT 46.4 09/15/2021 1135   PLT 112 (L) 07/27/2022  1430   PLT 139 (L) 09/15/2021  1135   MCV 91.2 07/27/2022 1430   MCV 89 09/15/2021 1135   MCH 29.2 07/27/2022 1430   MCHC 32.0 07/27/2022 1430   RDW 13.2 07/27/2022 1430   RDW 12.4 09/15/2021 1135   LYMPHSABS 1.7 07/27/2022 1430   LYMPHSABS 1.5 09/15/2021 1135   MONOABS 0.6 07/27/2022 1430   EOSABS 0.2 07/27/2022 1430   EOSABS 0.2 09/15/2021 1135   BASOSABS 0.0 07/27/2022 1430   BASOSABS 0.0 09/15/2021 1135      Latest Ref Rng & Units 07/27/2022    2:30 PM 04/07/2022    1:39 PM 01/20/2022    1:29 PM  CMP  Glucose 70 - 99 mg/dL 113  110  112   BUN 8 - 23 mg/dL 21  15  20   $ Creatinine 0.61 - 1.24 mg/dL 0.99  0.94  1.01   Sodium 135 - 145 mmol/L 138  141  139   Potassium 3.5 - 5.1 mmol/L 4.9  4.9  4.8   Chloride 98 - 111 mmol/L 104  103  106   CO2 22 - 32 mmol/L 28  24  29   $ Calcium 8.9 - 10.3 mg/dL 9.4  9.2  9.3   Total Protein 6.5 - 8.1 g/dL 7.2  6.9  7.1   Total Bilirubin 0.3 - 1.2 mg/dL 1.4  0.7  1.1   Alkaline Phos 38 - 126 U/L 62  80  62   AST 15 - 41 U/L 25  21  24   $ ALT 0 - 44 U/L 24  21  24     $ DIAGNOSTIC IMAGING:  I have independently reviewed the relevant imaging and discussed with the patient.   WRAP UP:  All questions were answered. The patient knows to call the clinic with any problems, questions or concerns.  Medical decision making: Moderate  Time spent on visit: I spent 25 minutes counseling the patient face to face. The total time spent in the appointment was 40 minutes and more than 50% was on counseling.  Harriett Rush, PA-C  08/03/2022 7:46 PM

## 2022-08-03 ENCOUNTER — Inpatient Hospital Stay (HOSPITAL_BASED_OUTPATIENT_CLINIC_OR_DEPARTMENT_OTHER): Payer: Medicare Other | Admitting: Physician Assistant

## 2022-08-03 VITALS — BP 136/73 | HR 66 | Temp 97.7°F | Resp 18 | Ht 72.0 in | Wt 177.8 lb

## 2022-08-03 DIAGNOSIS — D696 Thrombocytopenia, unspecified: Secondary | ICD-10-CM

## 2022-08-03 DIAGNOSIS — D472 Monoclonal gammopathy: Secondary | ICD-10-CM | POA: Diagnosis not present

## 2022-08-03 DIAGNOSIS — R936 Abnormal findings on diagnostic imaging of limbs: Secondary | ICD-10-CM

## 2022-08-03 DIAGNOSIS — G629 Polyneuropathy, unspecified: Secondary | ICD-10-CM | POA: Diagnosis not present

## 2022-08-03 DIAGNOSIS — Z79899 Other long term (current) drug therapy: Secondary | ICD-10-CM | POA: Diagnosis not present

## 2022-08-03 NOTE — Patient Instructions (Signed)
[facility] **   You were seen today by Tarri Abernethy PA-C for your follow-up visit.    MGUS As we discussed, MGUS is a precancerous condition where your body has abnormal plasma cells that make too many of a certain type of immunoglobulin. At this time, the elevation in your immunoglobulin is very mild and is not causing any issues. Will continue to keep watch on these labs every 6 months. Your x-rays did not show any signs of "lytic" bone lesions concerning for multiple myeloma.  However, there was a sclerotic bone lesion.  We will check a follow-up x-ray of the left humerus in 3 months.  LOW PLATELETS We will check labs in 3 months to help determine why your platelet levels are low  FOLLOW-UP APPOINTMENT: Office visit in 3 months (1 week after labs and x-ray)  ** Thank you for trusting me with your healthcare!  I strive to provide all of my patients with quality care at each visit.  If you receive a survey for this visit, I would be so grateful to you for taking the time to provide feedback.  Thank you in advance!  ~ Jalyssa Fleisher                   Dr. Derek Jack   &   Tarri Abernethy, PA-C   - - - - - - - - - - - - - - - - - -    Thank you for choosing [facility] to provide your oncology and hematology care.  To afford each patient quality time with our provider, please arrive at least 15 minutes before your scheduled appointment time.   If you have a lab appointment with the [facility] please come in thru the Main Entrance and check in at the main information desk.  You need to re-schedule your appointment should you arrive 10 or more minutes late.  We strive to give you quality time with our providers, and arriving late affects you and other patients whose appointments are after yours.  Also, if you no show three or more times for appointments you may be  dismissed from the clinic at the providers discretion.     Again, thank you for choosing [facility].  Our hope is that these requests will decrease the amount of time that you wait before being seen by our physicians.       _____________________________________________________________  Should you have questions after your visit to [facility], please contact our office at [phone] and follow the prompts.  Our office hours are 8:00 a.m. and 4:30 p.m. Monday - Friday.  Please note that voicemails left after 4:00 p.m. may not be returned until the following business day.  We are closed weekends and major holidays.  You do have access to a nurse 24-7, just call the main number to the clinic [phone] and do not press any options, hold on the line and a nurse will answer the phone.    For prescription refill requests, have your pharmacy contact our office and allow 72 hours.

## 2022-08-04 ENCOUNTER — Other Ambulatory Visit: Payer: Self-pay

## 2022-08-04 DIAGNOSIS — R936 Abnormal findings on diagnostic imaging of limbs: Secondary | ICD-10-CM

## 2022-08-04 DIAGNOSIS — D472 Monoclonal gammopathy: Secondary | ICD-10-CM

## 2022-08-04 DIAGNOSIS — D696 Thrombocytopenia, unspecified: Secondary | ICD-10-CM

## 2022-08-04 DIAGNOSIS — E039 Hypothyroidism, unspecified: Secondary | ICD-10-CM

## 2022-08-12 ENCOUNTER — Encounter: Payer: Self-pay | Admitting: Internal Medicine

## 2022-08-12 ENCOUNTER — Ambulatory Visit (INDEPENDENT_AMBULATORY_CARE_PROVIDER_SITE_OTHER): Payer: Medicare Other | Admitting: Internal Medicine

## 2022-08-12 VITALS — BP 134/69 | HR 70 | Ht 72.0 in | Wt 176.4 lb

## 2022-08-12 DIAGNOSIS — K4090 Unilateral inguinal hernia, without obstruction or gangrene, not specified as recurrent: Secondary | ICD-10-CM | POA: Diagnosis not present

## 2022-08-12 DIAGNOSIS — D472 Monoclonal gammopathy: Secondary | ICD-10-CM

## 2022-08-12 DIAGNOSIS — I10 Essential (primary) hypertension: Secondary | ICD-10-CM | POA: Diagnosis not present

## 2022-08-12 DIAGNOSIS — I2510 Atherosclerotic heart disease of native coronary artery without angina pectoris: Secondary | ICD-10-CM

## 2022-08-12 DIAGNOSIS — E039 Hypothyroidism, unspecified: Secondary | ICD-10-CM

## 2022-08-12 DIAGNOSIS — K5909 Other constipation: Secondary | ICD-10-CM

## 2022-08-12 MED ORDER — LACTULOSE 20 GM/30ML PO SOLN: 30.0000 mL | Freq: Every day | ORAL | 1 refills | Status: DC | PRN

## 2022-08-12 NOTE — Assessment & Plan Note (Signed)
CABG in 12/98; DES to the RCA SVG in 07/2002; restenosis SVG stent and re-intervention in 2006; 70% residual D1; cath in 11/2007: TO SVG M1  Followed by Cardiology On Aspirin, statin and Coreg Denies any anginal symptoms currently

## 2022-08-12 NOTE — Assessment & Plan Note (Signed)
Followed by oncology Recently bone scan showed left humeral sclerotic lesion, plan to get x-ray of left humerus

## 2022-08-12 NOTE — Assessment & Plan Note (Signed)
Has right groin area heavy sensation, likely due to inguinal hernia Check CT abdomen pelvis Will refer to the general surgery based on CT abdomen pelvis Avoid heavy lifting and frequent bending

## 2022-08-12 NOTE — Assessment & Plan Note (Signed)
BP Readings from Last 1 Encounters:  08/12/22 134/69   Overall well-controlled with Coreg, considering his age 87 for compliance with the medications Advised DASH diet and moderate exercise/walking as tolerated

## 2022-08-12 NOTE — Assessment & Plan Note (Signed)
Chronic idiopathic constipation Has tried multiple OTC laxatives and stool softeners without much relief Also takes prunes Linzess was not covered, now better with Lactulose Advised to maintain adequate hydration

## 2022-08-12 NOTE — Patient Instructions (Signed)
You are being scheduled to get CT abdomen pelvis done for right inguinal hernia/  Please avoid heavy lifting and frequent bending.  Please maintain adequate hydration and take Lactulose for constipation.

## 2022-08-12 NOTE — Progress Notes (Signed)
Established Patient Office Visit  Subjective:  Patient ID: Jorge Bishop, male    DOB: Jan 07, 1936  Age: 87 y.o. MRN: [id number]  CC:  Chief Complaint  Patient presents with   Hernia    Lower right hernia feels like something is pinching when he moves. Patient has a concern for a growth on his left arm.    HPI Jorge Bishop is a 87 y.o. male with past medical history of CAD s/p CABG, hypertension, HLD, prediabetes, hypothyroidism, MGUS, GERD, lumbar spinal stenosis s/p lumbar laminectomy and BPH who presents for f/u of his chronic medical conditions.  He reports heavy sensation in right groin area, which is worse with bending.  He has noticed pinching sensation upon movement.  He has history of right inguinal hernia repair surgery in 2012.  He denies any dysuria, hematuria or flank pain currently.  Denies any heavy lifting recently.  CAD s/p CABG and hypertension: BP is well-controlled. Takes medications regularly. Patient denies headache, dizziness, chest pain, dyspnea or palpitations.  He follows up with cardiology.  He is on Coreg, aspirin and atorvastatin.   MGUS: He was diagnosed with MGUS, and had a skeletal survey done, which showed 7 mm sclerotic lesion over left humerus.  Followed by oncology currently.  Neuropathy: He was started on gabapentin, but he felt drowsiness with 300 mg dose.  He was later placed on gabapentin 100 mg dose, which he has slowly up titrated to 100 mg twice daily.  He still complains of burning pain in his legs and feet.  Hypothyroidism: Takes levothyroxine.  Denies any recent change in weight or appetite.  Chronic constipation: He reports chronic constipation, but has improved with lactulose.  He denies any melena or hematochezia.  He has to strain hard while passing stool.  He has tried OTC stool softener-Dulcolax, MiraLAX, fiber supplement/Metamucil and probiotics without relief.  He has had to do manual disimpaction once as well.  Past Medical History:   Diagnosis Date   Arteriosclerotic cardiovascular disease (ASCVD)    Coronary artery bypass graft surgery in 12/98; DES to the RCA SVG in 07/2002; restenosis of the saphenous vein graft stent and renal intervention in 2006; 70% first diagonal present; calf in 11/2007-total obstruction of the saphenous vein graft to the first marginal   Benign prostatic hypertrophy    Cerebrovascular disease    60% bilateral internal carotid artery stenosis in 2012   Coronary artery disease    4 stents   Diaphragmatic hernia 01/15/2021   Diverticulosis    DJD (degenerative joint disease)    Erectile dysfunction    GERD (gastroesophageal reflux disease)    History of blood transfusion    History of kidney stones    Hyperlipidemia    Lipid profile in 12/2009:104, 91, 36, 50   Hypertension    Kidney stone 09/08/2017   Migraine headache    Right bundle branch block    Right ureteral stone 03/08/2019   Shortness of breath    Thrombocytopenia (HCC)    platelets of 104 in 03/2009   Upper gastrointestinal hemorrhage [id number]    Helicobacter pylori positive; subsequent laparoscopic fundoplication-2009    Past Surgical History:  Procedure Laterality Date   CARDIAC CATHETERIZATION     4 cardiac stents   CATARACT EXTRACTION W/PHACO Right 07/25/2012   Procedure: CATARACT EXTRACTION PHACO AND INTRAOCULAR LENS PLACEMENT ([place]);  Surgeon: Tonny Branch, MD;  Location: [facility];  Service: Ophthalmology;  Laterality: Right;  CDE:23.31   CATARACT EXTRACTION W/PHACO Left  08/18/2012   Procedure: CATARACT EXTRACTION PHACO AND INTRAOCULAR LENS PLACEMENT ([facility]);  Surgeon: Tonny Branch, MD;  Location: [facility];  Service: Ophthalmology;  Laterality: Left;  CDE: 16.07   COLONOSCOPY N/A 08/01/2014   Procedure: COLONOSCOPY;  Surgeon: Rogene Houston, MD;  Location: [facility];  Service: Endoscopy;  Laterality: N/A;  125 - moved to 10:30 - Ann to notify pt   CORONARY ARTERY BYPASS GRAFT  1998    CYSTOSCOPY/URETEROSCOPY/HOLMIUM LASER/STENT  PLACEMENT Right 11/12/2017   Procedure: CYSTOSCOPY/RIGHT RETROGRADE PYELOGRAM/RIGHT URETEROSCOPY/HOLMIUM LASER LITHOTRIPSY RIGHT URETERAL CALCULUS/RIGHT URETERAL STENT PLACEMENT;  Surgeon: [name], MD;  Location: [facility];  Service: Urology;  Laterality: Right;  right ureteral calculus given to patient's wife per Dr. [name]   CYSTOSCOPY/URETEROSCOPY/HOLMIUM LASER/STENT PLACEMENT Right 03/07/2019   Procedure: CYSTOSCOPY RIGHT RETROGRADE RIGHT URETEROSCOPY;  Surgeon: [name], MD;  Location: [facility];  Service: Urology;  Laterality: Right;   EXTRACORPOREAL SHOCK WAVE LITHOTRIPSY Right 09/13/2017   Procedure: RIGHT EXTRACORPOREAL SHOCK WAVE LITHOTRIPSY (ESWL);  Surgeon: Lucas Mallow, MD;  Location: [facility];  Service: Urology;  Laterality: Right;   HERNIA REPAIR     right inguinal   LAPAROSCOPIC NISSEN FUNDOPLICATION  0000000, [id number]    revision in 2009 along with repair of paraesophagel hernia    PARAESOPHAGEAL HERNIA REPAIR     TRANSURETHRAL RESECTION OF PROSTATE N/A 03/07/2019   Procedure: TRANSURETHRAL RESECTION OF THE PROSTATE (TURP);  Surgeon: [name], MD;  Location: [facility];  Service: Urology;  Laterality: N/A;    Family History  Problem Relation Age of Onset   CAD Father     Social History   Socioeconomic History   Marital status: Married    Spouse name: Not on file   Number of children: Not on file   Years of education: Not on file   Highest education level: Not on file  Occupational History   Occupation: Full time    Employer: RETIRED  Tobacco Use   Smoking status: Never   Smokeless tobacco: Never  Vaping Use   Vaping Use: Never used  Substance and Sexual Activity   Alcohol use: No    Alcohol/week: 0.0 standard drinks of alcohol   Drug use: No   Sexual activity: Yes    Birth control/protection: None  Other Topics Concern   Not on file  Social History Narrative   Right handed   Drinks caffeine   Two story home   Social Determinants of Health   Financial  Resource Strain: Low Risk  (02/17/2022)   Overall Financial Resource Strain (CARDIA)    Difficulty of Paying Living Expenses: Not hard at all  Food Insecurity: No Food Insecurity (02/17/2022)   Hunger Vital Sign    Worried About Running Out of Food in the Last Year: Never true    [place] in the Last Year: Never true  Transportation Needs: No Transportation Needs (02/17/2022)   PRAPARE - Hydrologist (Medical): No    Lack of Transportation (Non-Medical): No  Physical Activity: Sufficiently Active (02/17/2022)   Exercise Vital Sign    Days of Exercise per Week: 5 days    Minutes of Exercise per Session: 60 min  Stress: No Stress Concern Present (02/17/2022)   [place]    Feeling of Stress : Not at all  Social Connections: Moderately Isolated (02/17/2022)   Social Connection and Isolation Panel [NHANES]    Frequency of Communication with  Friends and Family: More than three times a week    Frequency of Social Gatherings with Friends and Family: More than three times a week    Attends Religious Services: Never    Marine scientist or Organizations: No    Attends Archivist Meetings: Never    Marital Status: Married  Human resources officer Violence: Unknown (02/17/2022)   Humiliation, Afraid, Rape, and Kick questionnaire    Fear of Current or Ex-Partner: No    Emotionally Abused: Not on file    Physically Abused: No    Sexually Abused: No    Outpatient Medications Prior to Visit  Medication Sig Dispense Refill   aspirin 81 MG tablet Take 81 mg by mouth daily.       atorvastatin (LIPITOR) 20 MG tablet Take 20 mg by mouth at bedtime.       carvedilol (COREG) 6.25 MG tablet Take 1 tablet (6.25 mg total) by mouth 2 (two) times daily with a meal. 180 tablet 3   Cholecalciferol (VITAMIN D3) 50 MCG (2000 UT) TABS Take 10,000 Units by mouth daily.      cyanocobalamin 1000 MCG tablet Take 1,000  mcg by mouth daily.     FIBER PO Take 2 capsules by mouth daily as needed (constipation).     fluorometholone (FML) 0.1 % ophthalmic suspension SMARTSIG:1 Drop(s) In Eye(s) 2-4 Times Daily PRN     gabapentin (NEURONTIN) 100 MG capsule Take 1 capsule (100 mg total) by mouth 3 (three) times daily. 90 capsule 5   nitroGLYCERIN (NITROSTAT) 0.4 MG SL tablet Place 1 tablet (0.4 mg total) under the tongue every 5 (five) minutes as needed for chest pain. 90 tablet 3   ondansetron (ZOFRAN) 4 MG tablet Take 1 tablet (4 mg total) by mouth every 8 (eight) hours as needed for nausea or vomiting. 90 tablet 1   pantoprazole (PROTONIX) 20 MG tablet TAKE 1 TABLET DAILY 90 tablet 0   promethazine (PHENERGAN) 25 MG tablet Take 25 mg by mouth every 6 (six) hours as needed for nausea or vomiting.     SYNTHROID 50 MCG tablet Take 50 mcg by mouth daily.     traMADol (ULTRAM) 50 MG tablet Take 50 mg by mouth every 6 (six) hours as needed (pain).     Lactulose 20 GM/30ML SOLN Take 30 mLs (20 g total) by mouth daily as needed (Constipation). 450 mL 1   No facility-administered medications prior to visit.    Allergies  Allergen Reactions   Bactrim [Sulfamethoxazole-Trimethoprim] Anaphylaxis   Clarithromycin Anaphylaxis    REACTION: UNKNOWN REACTION   Codeine Nausea And Vomiting   Erythromycin Anaphylaxis   Sulfa Antibiotics Anaphylaxis   Trimethoprim     ROS Review of Systems  Constitutional:  Negative for chills and fever.  HENT:  Negative for congestion and sore throat.   Eyes:  Negative for pain and discharge.  Respiratory:  Negative for cough and shortness of breath.   Cardiovascular:  Negative for chest pain and palpitations.  Gastrointestinal:  Positive for constipation. Negative for diarrhea, nausea and vomiting.  Endocrine: Negative for polydipsia and polyuria.  Genitourinary:  Negative for dysuria and hematuria.  Musculoskeletal:  Positive for arthralgias, back pain and gait problem. Negative for  neck pain and neck stiffness.  Skin:  Negative for rash.  Neurological:  Positive for numbness. Negative for dizziness, weakness and headaches.  Psychiatric/Behavioral:  Negative for agitation and behavioral problems.       Objective:    Physical Exam  Vitals reviewed.  Constitutional:      General: He is not in acute distress.    Appearance: He is not diaphoretic.  HENT:     Head: Normocephalic and atraumatic.     Nose: Nose normal.     Mouth/Throat:     Mouth: Mucous membranes are moist.  Eyes:     General: No scleral icterus.    Extraocular Movements: Extraocular movements intact.  Cardiovascular:     Rate and Rhythm: Normal rate and regular rhythm.     Pulses: Normal pulses.     Heart sounds: Normal heart sounds. No murmur heard. Pulmonary:     Breath sounds: Normal breath sounds. No wheezing or rales.  Abdominal:     Palpations: Abdomen is soft.     Tenderness: There is no abdominal tenderness. There is no guarding or rebound.  Musculoskeletal:     Cervical back: Neck supple. No tenderness.     Right lower leg: No edema.     Left lower leg: No edema.  Skin:    General: Skin is warm and dry.     Findings: Bruising (B/l UE) present.  Neurological:     General: No focal deficit present.     Mental Status: He is alert and oriented to person, place, and time.     Sensory: Sensory deficit (B/l feet) present.     Motor: Weakness (B/l LE) present.     Gait: Gait abnormal.  Psychiatric:        Mood and Affect: Mood normal.        Behavior: Behavior normal.     BP 134/69 (BP Location: Right Arm, Patient Position: Sitting, Cuff Size: Normal)   Pulse 70   Ht 6' (1.829 m)   Wt 176 lb 6.4 oz (80 kg)   SpO2 96%   BMI 23.92 kg/m  Wt Readings from Last 3 Encounters:  08/12/22 176 lb 6.4 oz (80 kg)  08/03/22 177 lb 12.8 oz (80.6 kg)  04/07/22 183 lb 3.2 oz (83.1 kg)    Lab Results  Component Value Date   TSH 2.220 04/07/2022   Lab Results  Component Value Date    WBC 5.6 07/27/2022   HGB 15.6 07/27/2022   HCT 48.8 07/27/2022   MCV 91.2 07/27/2022   PLT 112 (L) 07/27/2022   Lab Results  Component Value Date   NA 138 07/27/2022   K 4.9 07/27/2022   CO2 28 07/27/2022   GLUCOSE 113 (H) 07/27/2022   BUN 21 07/27/2022   CREATININE 0.99 07/27/2022   BILITOT 1.4 (H) 07/27/2022   ALKPHOS 62 07/27/2022   AST 25 07/27/2022   ALT 24 07/27/2022   PROT 7.2 07/27/2022   ALBUMIN 4.1 07/27/2022   CALCIUM 9.4 07/27/2022   ANIONGAP 6 07/27/2022   EGFR 80 04/07/2022   Lab Results  Component Value Date   CHOL 131 04/07/2022   Lab Results  Component Value Date   HDL 29 (L) 04/07/2022   Lab Results  Component Value Date   LDLCALC 77 04/07/2022   Lab Results  Component Value Date   TRIG 143 04/07/2022   Lab Results  Component Value Date   CHOLHDL 4.5 04/07/2022   Lab Results  Component Value Date   HGBA1C 5.9 (H) 09/15/2021      Assessment & Plan:   Problem List Items Addressed This Visit       Cardiovascular and Mediastinum   Hypertension    BP Readings from Last 1 Encounters:  08/12/22 134/69  Overall well-controlled with Coreg, considering his age 36 for compliance with the medications Advised DASH diet and moderate exercise/walking as tolerated      CAD (coronary artery disease)    CABG in 12/98; DES to the RCA SVG in 07/2002; restenosis SVG stent and re-intervention in 2006; 70% residual D1; cath in 11/2007: TO SVG M1  Followed by Cardiology On Aspirin, statin and Coreg Denies any anginal symptoms currently        Digestive   Chronic constipation    Chronic idiopathic constipation Has tried multiple OTC laxatives and stool softeners without much relief Also takes prunes Linzess was not covered, now better with Lactulose Advised to maintain adequate hydration      Relevant Medications   Lactulose 20 GM/30ML SOLN     Endocrine   Hypothyroidism    On Levothyroxine 50 mcg QD Last TSH - wnl        Other    MGUS (monoclonal gammopathy of unknown significance)    Followed by oncology Recently bone scan showed left humeral sclerotic lesion, plan to get x-ray of left humerus       Right inguinal hernia - Primary    Has right groin area heavy sensation, likely due to inguinal hernia Check CT abdomen pelvis Will refer to the general surgery based on CT abdomen pelvis Avoid heavy lifting and frequent bending      Relevant Orders   CT Abdomen Pelvis Wo Contrast   Meds ordered this encounter  Medications   Lactulose 20 GM/30ML SOLN    Sig: Take 30 mLs (20 g total) by mouth daily as needed (Constipation).    Dispense:  450 mL    Refill:  1    Follow-up: No follow-ups on file.    Lindell Spar, MD

## 2022-08-12 NOTE — Assessment & Plan Note (Signed)
On Levothyroxine 50 mcg QD Last TSH - wnl

## 2022-08-13 ENCOUNTER — Encounter: Payer: Self-pay | Admitting: Radiology

## 2022-08-25 ENCOUNTER — Encounter (HOSPITAL_COMMUNITY): Payer: Self-pay

## 2022-08-25 ENCOUNTER — Ambulatory Visit (HOSPITAL_COMMUNITY): Admission: RE | Admit: 2022-08-25 | Payer: Medicare Other | Source: Ambulatory Visit

## 2022-08-26 ENCOUNTER — Other Ambulatory Visit: Payer: Self-pay | Admitting: Physician Assistant

## 2022-08-26 DIAGNOSIS — R936 Abnormal findings on diagnostic imaging of limbs: Secondary | ICD-10-CM

## 2022-09-03 ENCOUNTER — Ambulatory Visit (INDEPENDENT_AMBULATORY_CARE_PROVIDER_SITE_OTHER): Payer: Medicare Other | Admitting: Urology

## 2022-09-03 VITALS — BP 138/83 | HR 76 | Ht 72.0 in | Wt 172.0 lb

## 2022-09-03 DIAGNOSIS — G8929 Other chronic pain: Secondary | ICD-10-CM | POA: Diagnosis not present

## 2022-09-03 DIAGNOSIS — R3915 Urgency of urination: Secondary | ICD-10-CM

## 2022-09-03 DIAGNOSIS — R109 Unspecified abdominal pain: Secondary | ICD-10-CM | POA: Diagnosis not present

## 2022-09-03 DIAGNOSIS — N200 Calculus of kidney: Secondary | ICD-10-CM

## 2022-09-03 DIAGNOSIS — N3941 Urge incontinence: Secondary | ICD-10-CM | POA: Diagnosis not present

## 2022-09-03 DIAGNOSIS — N3281 Overactive bladder: Secondary | ICD-10-CM

## 2022-09-03 LAB — URINALYSIS, ROUTINE W REFLEX MICROSCOPIC
Bilirubin, UA: NEGATIVE
Glucose, UA: NEGATIVE
Ketones, UA: NEGATIVE
Leukocytes,UA: NEGATIVE
Nitrite, UA: NEGATIVE
Protein,UA: NEGATIVE
RBC, UA: NEGATIVE
Specific Gravity, UA: 1.015 (ref 1.005–1.030)
Urobilinogen, Ur: 1 mg/dL (ref 0.2–1.0)
pH, UA: 5.5 (ref 5.0–7.5)

## 2022-09-03 LAB — BLADDER SCAN AMB NON-IMAGING: Scan Result: 113

## 2022-09-03 MED ORDER — MIRABEGRON ER 25 MG PO TB24: 25.0000 mg | ORAL_TABLET | Freq: Every day | ORAL | 0 refills | Status: DC

## 2022-09-03 NOTE — Progress Notes (Signed)
post void residual =136mL

## 2022-09-03 NOTE — Progress Notes (Signed)
Subjective:  1. Urge incontinence   2. Urgency of urination   3. Renal stones   4. Flank pain      09/03/22: Mr. Umholtz was last seen on 09/22/19 for his history of BPH with BOO with a prior TURP and right URS for a stone in 9/22. He had ESWL in 2019.  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   He is back now with some increased irritative symptoms.  His IPSS is 10 with urgency and some UUI.  He has a reduced stream and nocturia x 1. He is wearing a liner but doesn't have to change often. He has had no flank pain or hematuria.  He has some right flank pain/back pain intermittently for the last few years.  It is worse in the AM.  His PVR is 180ml.     ROS:  ROS:  A complete review of systems was performed.  All systems are negative except for pertinent findings as noted.   Review of Systems  Musculoskeletal:  Positive for back pain.  Endo/Heme/Allergies:  Bruises/bleeds easily.    Allergies  Allergen Reactions   Bactrim [Sulfamethoxazole-Trimethoprim] Anaphylaxis   Clarithromycin Anaphylaxis    REACTION: UNKNOWN REACTION   Codeine Nausea And Vomiting   Erythromycin Anaphylaxis   Sulfa Antibiotics Anaphylaxis   Trimethoprim     Outpatient Encounter Medications as of 09/03/2022  Medication Sig   aspirin 81 MG tablet Take 81 mg by mouth daily.     atorvastatin (LIPITOR) 20 MG tablet Take 20 mg by mouth at bedtime.     carvedilol (COREG) 6.25 MG tablet Take 1 tablet (6.25 mg total) by mouth 2 (two) times daily with a meal.   Cholecalciferol (VITAMIN D3) 50 MCG (2000 UT) TABS Take 10,000 Units by mouth daily.    cyanocobalamin 1000 MCG tablet Take 1,000 mcg by mouth daily.   FIBER PO Take 2 capsules by mouth daily as needed (constipation).   fluorometholone (FML) 0.1 % ophthalmic suspension SMARTSIG:1 Drop(s) In Eye(s) 2-4 Times Daily PRN   gabapentin (NEURONTIN) 100 MG capsule Take 1 capsule (100  mg total) by mouth 3 (three) times daily.   Lactulose 20 GM/30ML SOLN Take 30 mLs (20 g total) by mouth daily as needed (Constipation).   mirabegron ER (MYRBETRIQ) 25 MG TB24 tablet Take 1 tablet (25 mg total) by mouth daily.   ondansetron (ZOFRAN) 4 MG tablet Take 1 tablet (4 mg total) by mouth every 8 (eight) hours as needed for nausea or vomiting.   pantoprazole (PROTONIX) 20 MG tablet TAKE 1 TABLET DAILY   promethazine (PHENERGAN) 25 MG tablet Take 25 mg by mouth every 6 (six) hours as needed for nausea or vomiting.   SYNTHROID 50 MCG tablet Take 50 mcg by mouth daily.   traMADol (ULTRAM) 50 MG tablet Take 50 mg by mouth every 6 (six) hours as needed (pain).   nitroGLYCERIN (NITROSTAT) 0.4 MG SL tablet Place 1 tablet (0.4 mg total) under the tongue every 5 (five) minutes as needed for chest pain.   No facility-administered encounter medications on file as of 09/03/2022.    Past Medical History:  Diagnosis Date   Arteriosclerotic cardiovascular disease (ASCVD)    Coronary artery bypass graft surgery in 12/98; DES to the RCA SVG in 07/2002; restenosis of the saphenous vein graft stent and renal intervention in 2006; 70% first diagonal present; calf in 11/2007-total  obstruction of the saphenous vein graft to the first marginal   Benign prostatic hypertrophy    Cerebrovascular disease    60% bilateral internal carotid artery stenosis in 2012   Coronary artery disease    4 stents   Diaphragmatic hernia 01/15/2021   Diverticulosis    DJD (degenerative joint disease)    Erectile dysfunction    GERD (gastroesophageal reflux disease)    History of blood transfusion    History of kidney stones    Hyperlipidemia    Lipid profile in 12/2009:104, 91, 36, 50   Hypertension    Kidney stone 09/08/2017   Migraine headache    Right bundle branch block    Right ureteral stone 03/08/2019   Shortness of breath    Thrombocytopenia (HCC)    platelets of 104 in 03/2009   Upper gastrointestinal hemorrhage  [id number]    Helicobacter pylori positive; subsequent laparoscopic fundoplication-2009    Past Surgical History:  Procedure Laterality Date   CARDIAC CATHETERIZATION     4 cardiac stents   CATARACT EXTRACTION W/PHACO Right 07/25/2012   Procedure: CATARACT EXTRACTION PHACO AND INTRAOCULAR LENS PLACEMENT ([place]);  Surgeon: Tonny Branch, MD;  Location: [facility];  Service: Ophthalmology;  Laterality: Right;  CDE:23.31   CATARACT EXTRACTION W/PHACO Left 08/18/2012   Procedure: CATARACT EXTRACTION PHACO AND INTRAOCULAR LENS PLACEMENT ([facility]);  Surgeon: Tonny Branch, MD;  Location: [facility];  Service: Ophthalmology;  Laterality: Left;  CDE: 16.07   COLONOSCOPY N/A 08/01/2014   Procedure: COLONOSCOPY;  Surgeon: Rogene Houston, MD;  Location: [facility];  Service: Endoscopy;  Laterality: N/A;  125 - moved to 10:30 - Ann to notify pt   CORONARY ARTERY BYPASS GRAFT  1998    CYSTOSCOPY/URETEROSCOPY/HOLMIUM LASER/STENT PLACEMENT Right 11/12/2017   Procedure: CYSTOSCOPY/RIGHT RETROGRADE PYELOGRAM/RIGHT URETEROSCOPY/HOLMIUM LASER LITHOTRIPSY RIGHT URETERAL CALCULUS/RIGHT URETERAL STENT PLACEMENT;  Surgeon: [name], MD;  Location: [facility];  Service: Urology;  Laterality: Right;  right ureteral calculus given to patient's wife per Dr. [name]   CYSTOSCOPY/URETEROSCOPY/HOLMIUM LASER/STENT PLACEMENT Right 03/07/2019   Procedure: CYSTOSCOPY RIGHT RETROGRADE RIGHT URETEROSCOPY;  Surgeon: [name], MD;  Location: [facility];  Service: Urology;  Laterality: Right;   EXTRACORPOREAL SHOCK WAVE LITHOTRIPSY Right 09/13/2017   Procedure: RIGHT EXTRACORPOREAL SHOCK WAVE LITHOTRIPSY (ESWL);  Surgeon: Lucas Mallow, MD;  Location: [facility];  Service: Urology;  Laterality: Right;   HERNIA REPAIR     right inguinal   LAPAROSCOPIC NISSEN FUNDOPLICATION  0000000, [id number]    revision in 2009 along with repair of paraesophagel hernia    PARAESOPHAGEAL HERNIA REPAIR     TRANSURETHRAL RESECTION OF PROSTATE N/A 03/07/2019   Procedure: TRANSURETHRAL  RESECTION OF THE PROSTATE (TURP);  Surgeon: [name], MD;  Location: [facility];  Service: Urology;  Laterality: N/A;    Social History   Socioeconomic History   Marital status: Married    Spouse name: Not on file   Number of children: Not on file   Years of education: Not on file   Highest education level: Not on file  Occupational History   Occupation: Full time    Employer: RETIRED  Tobacco Use   Smoking status: Never   Smokeless tobacco: Never  Vaping Use   Vaping Use: Never used  Substance and Sexual Activity   Alcohol use: No    Alcohol/week: 0.0 standard drinks of alcohol   Drug use: No   Sexual activity: Yes    Birth control/protection: None  Other Topics Concern  Not on file  Social History Narrative   Right handed   Drinks caffeine   Two story home   Social Determinants of Health   Financial Resource Strain: Low Risk  (02/17/2022)   Overall Financial Resource Strain (CARDIA)    Difficulty of Paying Living Expenses: Not hard at all  Food Insecurity: No Food Insecurity (02/17/2022)   Hunger Vital Sign    Worried About Running Out of Food in the Last Year: Never true    Ran Out of Food in the Last Year: Never true  Transportation Needs: No Transportation Needs (02/17/2022)   PRAPARE - Hydrologist (Medical): No    Lack of Transportation (Non-Medical): No  Physical Activity: Sufficiently Active (02/17/2022)   Exercise Vital Sign    Days of Exercise per Week: 5 days    Minutes of Exercise per Session: 60 min  Stress: No Stress Concern Present (02/17/2022)   [place]    Feeling of Stress : Not at all  Social Connections: Moderately Isolated (02/17/2022)   Social Connection and Isolation Panel [NHANES]    Frequency of Communication with Friends and Family: More than three times a week    Frequency of Social Gatherings with Friends and Family: More than three times a week     Attends Religious Services: Never    Marine scientist or Organizations: No    Attends Archivist Meetings: Never    Marital Status: Married  Human resources officer Violence: Unknown (02/17/2022)   Humiliation, Afraid, Rape, and Kick questionnaire    Fear of Current or Ex-Partner: No    Emotionally Abused: Not on file    Physically Abused: No    Sexually Abused: No    Family History  Problem Relation Age of Onset   CAD Father        Objective: Vitals:   09/03/22 1432  BP: 138/83  Pulse: 76     Physical Exam Vitals reviewed.  Constitutional:      Appearance: Normal appearance.  Abdominal:     General: Abdomen is flat.     Palpations: Abdomen is soft.     Tenderness: There is right CVA tenderness (mild).     Hernia: No hernia is present.  Genitourinary:    Comments: Normal phallus with adequate meatus. Scrotum, testes and epididymis normal. AP/NST without mass. Prostate 1.5+ benign. SV non-palpation.  Neurological:     Mental Status: He is alert.     Lab Results:  PSA No results found for: "PSA" No results found for: "TESTOSTERONE" UA is clear.    Studies/Results: No results found. DG Bone Survey Met  Result Date: 07/28/2022 CLINICAL DATA:  MGUS EXAM: METASTATIC BONE SURVEY COMPARISON:  Bone survey 09/03/2021 FINDINGS: 7 mm sclerotic lesion in the mid left humerus appears new from prior. No other sclerotic or lytic osseous lesions are identified. Degenerative changes affect the spine rib there are postsurgical changes in the right lower extremity. IMPRESSION: 7 mm sclerotic lesion in the mid left humerus appears new from prior. No other sclerotic or lytic osseous lesions are identified. Electronically Signed   By: Ronney Asters M.D.   On: 07/28/2022 22:25    No results found for this or any previous visit.  No results found for this or any previous visit.  No results found for this or any previous visit.    No valid procedures specified. No  results found for this or any  previous visit. PVR is 141ml   Assessment & Plan: He has OAB wet.   I discussed options and will have him try Myrbetriq 25mg .  Side effects and instructions reviewed.      Renal stones with chronic right flank pain.  The pain is likely musculoskeletal but he has a CT tomorrow and I will take a look at that.   (The CT shows no significant GU findings, but there is some fat between the external oblique and internal oblique in the RUQ that has an associated defect in the abdominal fascia)  Meds ordered this encounter  Medications   mirabegron ER (MYRBETRIQ) 25 MG TB24 tablet    Sig: Take 1 tablet (25 mg total) by mouth daily.    Dispense:  28 tablet    Refill:  0     Orders Placed This Encounter  Procedures   Urinalysis, Routine w reflex microscopic   Bladder Scan (Post Void Residual) in office      Return in about 4 weeks (around 10/01/2022) for PVR on return.   CC: Lindell Spar, MD      [name] 09/04/2022

## 2022-09-04 ENCOUNTER — Ambulatory Visit (HOSPITAL_COMMUNITY)
Admission: RE | Admit: 2022-09-04 | Discharge: 2022-09-04 | Disposition: A | Discharge status: Home / Self Care | Payer: Medicare Other | Source: Ambulatory Visit | Attending: Internal Medicine | Admitting: Internal Medicine

## 2022-09-04 ENCOUNTER — Encounter: Payer: Self-pay | Admitting: Urology

## 2022-09-04 ENCOUNTER — Ambulatory Visit (HOSPITAL_COMMUNITY): Payer: Medicare Other

## 2022-09-04 DIAGNOSIS — K4090 Unilateral inguinal hernia, without obstruction or gangrene, not specified as recurrent: Secondary | ICD-10-CM | POA: Insufficient documentation

## 2022-10-13 ENCOUNTER — Ambulatory Visit: Payer: Medicare Other | Admitting: Internal Medicine

## 2022-10-15 ENCOUNTER — Ambulatory Visit (INDEPENDENT_AMBULATORY_CARE_PROVIDER_SITE_OTHER): Payer: Medicare Other | Admitting: Urology

## 2022-10-15 VITALS — BP 130/73 | HR 65 | Ht 72.0 in | Wt 172.0 lb

## 2022-10-15 DIAGNOSIS — Z87442 Personal history of urinary calculi: Secondary | ICD-10-CM

## 2022-10-15 DIAGNOSIS — R109 Unspecified abdominal pain: Secondary | ICD-10-CM

## 2022-10-15 DIAGNOSIS — N3941 Urge incontinence: Secondary | ICD-10-CM | POA: Diagnosis not present

## 2022-10-15 DIAGNOSIS — R3915 Urgency of urination: Secondary | ICD-10-CM

## 2022-10-15 LAB — URINALYSIS, ROUTINE W REFLEX MICROSCOPIC
Bilirubin, UA: NEGATIVE
Glucose, UA: NEGATIVE
Ketones, UA: NEGATIVE
Leukocytes,UA: NEGATIVE
Nitrite, UA: NEGATIVE
RBC, UA: NEGATIVE
Specific Gravity, UA: 1.025 (ref 1.005–1.030)
Urobilinogen, Ur: 2 mg/dL — ABNORMAL HIGH (ref 0.2–1.0)
pH, UA: 6 (ref 5.0–7.5)

## 2022-10-15 LAB — BLADDER SCAN AMB NON-IMAGING: Scan Result: 6

## 2022-10-15 MED ORDER — MIRABEGRON ER 25 MG PO TB24: 25.0000 mg | ORAL_TABLET | Freq: Every day | ORAL | 3 refills | Status: DC

## 2022-10-15 NOTE — Progress Notes (Signed)
post void residual =6mL 

## 2022-10-15 NOTE — Progress Notes (Signed)
Subjective:  1. Urge incontinence   2. Urgency of urination   3. Flank pain   4. History of nephrolithiasis    10/15/22: [name] returns today in f/u.  He has had a good response to the Myrbetriq 25mg  and has reduced urgency and no nocturia.   He had a CT that showed some stable intramuscular fat in the right upper quadrant that is stable for several years and no stones are noted.  UA is clear and his PVR is 6ml.  09/03/22: [name] was last seen on 09/22/19 for his history of BPH with BOO with a prior TURP and right URS for a stone in 9/22. He had ESWL in 2019.  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   He is back now with some increased irritative symptoms.  His IPSS is 10 with urgency and some UUI.  He has a reduced stream and nocturia x 1. He is wearing a liner but doesn't have to change often. He has had no flank pain or hematuria.  He has some right flank pain/back pain intermittently for the last few years.  It is worse in the AM.  His PVR is .   IPSS     Row Name 10/15/22 1500         International Prostate Symptom Score   How often have you had the sensation of not emptying your bladder? Less than 1 in 5     How often have you had to urinate less than every two hours? Not at All     How often have you found you stopped and started again several times when you urinated? Less than 1 in 5 times     How often have you found it difficult to postpone urination? Less than 1 in 5 times     How often have you had a weak urinary stream? Less than half the time     How often have you had to strain to start urination? Not at All     How many times did you typically get up at night to urinate? None     Total IPSS Score 5       Quality of Life due to urinary symptoms   If you were to spend the rest of your life with your urinary condition just the way it is now how would you feel about that? Mostly  Satisfied               ROS:  ROS:  A complete review of systems was performed.  All systems are negative except for pertinent findings as noted.   Review of Systems  Musculoskeletal:  Positive for back pain.  Endo/Heme/Allergies:  Bruises/bleeds easily.    Allergies  Allergen Reactions   Bactrim [Sulfamethoxazole-Trimethoprim] Anaphylaxis   Clarithromycin Anaphylaxis    REACTION: UNKNOWN REACTION   Codeine Nausea And Vomiting   Erythromycin Anaphylaxis   Sulfa Antibiotics Anaphylaxis   Trimethoprim     Outpatient Encounter Medications as of 10/15/2022  Medication Sig   aspirin 81 MG tablet Take 81 mg by mouth daily.     atorvastatin (LIPITOR) 20 MG tablet Take 20 mg by mouth at bedtime.     carvedilol (COREG) 6.25 MG tablet Take 1 tablet (6.25 mg total) by mouth 2 (two) times daily with a meal.   Cholecalciferol (VITAMIN D3) 50 MCG (2000 UT)  TABS Take 10,000 Units by mouth daily.    cyanocobalamin 1000 MCG tablet Take 1,000 mcg by mouth daily.   FIBER PO Take 2 capsules by mouth daily as needed (constipation).   fluorometholone (FML) 0.1 % ophthalmic suspension SMARTSIG:1 Drop(s) In Eye(s) 2-4 Times Daily PRN   gabapentin (NEURONTIN) 100 MG capsule Take 1 capsule (100 mg total) by mouth 3 (three) times daily.   Lactulose 20 GM/30ML SOLN Take 30 mLs (20 g total) by mouth daily as needed (Constipation).   ondansetron (ZOFRAN) 4 MG tablet Take 1 tablet (4 mg total) by mouth every 8 (eight) hours as needed for nausea or vomiting.   pantoprazole (PROTONIX) 20 MG tablet TAKE 1 TABLET DAILY   promethazine (PHENERGAN) 25 MG tablet Take 25 mg by mouth every 6 (six) hours as needed for nausea or vomiting.   SYNTHROID 50 MCG tablet Take 50 mcg by mouth daily.   traMADol (ULTRAM) 50 MG tablet Take 50 mg by mouth every 6 (six) hours as needed (pain).   [DISCONTINUED] mirabegron ER (MYRBETRIQ) 25 MG TB24 tablet Take 1 tablet (25 mg total) by mouth daily.   mirabegron ER (MYRBETRIQ) 25  MG TB24 tablet Take 1 tablet (25 mg total) by mouth daily.   nitroGLYCERIN (NITROSTAT) 0.4 MG SL tablet Place 1 tablet (0.4 mg total) under the tongue every 5 (five) minutes as needed for chest pain.   No facility-administered encounter medications on file as of 10/15/2022.    Past Medical History:  Diagnosis Date   Arteriosclerotic cardiovascular disease (ASCVD)    Coronary artery bypass graft surgery in 12/98; DES to the RCA SVG in 07/2002; restenosis of the saphenous vein graft stent and renal intervention in 2006; 70% first diagonal present; calf in 11/2007-total obstruction of the saphenous vein graft to the first marginal   Benign prostatic hypertrophy    Cerebrovascular disease    60% bilateral internal carotid artery stenosis in 2012   Coronary artery disease    4 stents   Diaphragmatic hernia 01/15/2021   Diverticulosis    DJD (degenerative joint disease)    Erectile dysfunction    GERD (gastroesophageal reflux disease)    History of blood transfusion    History of kidney stones    Hyperlipidemia    Lipid profile in 12/2009:104, 91, 36, 50   Hypertension    Kidney stone 09/08/2017   Migraine headache    Right bundle branch block    Right ureteral stone 03/08/2019   Shortness of breath    Thrombocytopenia (HCC)    platelets of 104 in 03/2009   Upper gastrointestinal hemorrhage 10/09    Helicobacter pylori positive; subsequent laparoscopic fundoplication-2009    Past Surgical History:  Procedure Laterality Date   CARDIAC CATHETERIZATION     4 cardiac stents   CATARACT EXTRACTION W/PHACO Right 07/25/2012   Procedure: CATARACT EXTRACTION PHACO AND INTRAOCULAR LENS PLACEMENT (IOC);  Surgeon: Gemma Payor, MD;  Location: [facility];  Service: Ophthalmology;  Laterality: Right;  CDE:23.31   CATARACT EXTRACTION W/PHACO Left 08/18/2012   Procedure: CATARACT EXTRACTION PHACO AND INTRAOCULAR LENS PLACEMENT (IOC);  Surgeon: Gemma Payor, MD;  Location: [facility];  Service: Ophthalmology;   Laterality: Left;  CDE: 16.07   COLONOSCOPY N/A 08/01/2014   Procedure: COLONOSCOPY;  Surgeon: Malissa Hippo, MD;  Location: [facility];  Service: Endoscopy;  Laterality: N/A;  125 - moved to 10:30 - Ann to notify pt   CORONARY ARTERY BYPASS GRAFT  1998    CYSTOSCOPY/URETEROSCOPY/HOLMIUM LASER/STENT  PLACEMENT Right 11/12/2017   Procedure: CYSTOSCOPY/RIGHT RETROGRADE PYELOGRAM/RIGHT URETEROSCOPY/HOLMIUM LASER LITHOTRIPSY RIGHT URETERAL CALCULUS/RIGHT URETERAL STENT PLACEMENT;  Surgeon: Bjorn Pippin, MD;  Location: [facility];  Service: Urology;  Laterality: Right;  right ureteral calculus given to patient's wife per Dr. Annabell Howells   CYSTOSCOPY/URETEROSCOPY/HOLMIUM LASER/STENT PLACEMENT Right 03/07/2019   Procedure: CYSTOSCOPY RIGHT RETROGRADE RIGHT URETEROSCOPY;  Surgeon: Bjorn Pippin, MD;  Location: [facility];  Service: Urology;  Laterality: Right;   EXTRACORPOREAL SHOCK WAVE LITHOTRIPSY Right 09/13/2017   Procedure: RIGHT EXTRACORPOREAL SHOCK WAVE LITHOTRIPSY (ESWL);  Surgeon: Crista Elliot, MD;  Location: [facility];  Service: Urology;  Laterality: Right;   HERNIA REPAIR     right inguinal   LAPAROSCOPIC NISSEN FUNDOPLICATION  1997, 2009    revision in 2009 along with repair of paraesophagel hernia    PARAESOPHAGEAL HERNIA REPAIR     TRANSURETHRAL RESECTION OF PROSTATE N/A 03/07/2019   Procedure: TRANSURETHRAL RESECTION OF THE PROSTATE (TURP);  Surgeon: Bjorn Pippin, MD;  Location: [facility];  Service: Urology;  Laterality: N/A;    Social History   Socioeconomic History   Marital status: Married    Spouse name: Not on file   Number of children: Not on file   Years of education: Not on file   Highest education level: Not on file  Occupational History   Occupation: Full time    Employer: RETIRED  Tobacco Use   Smoking status: Never   Smokeless tobacco: Never  Vaping Use   Vaping Use: Never used  Substance and Sexual Activity   Alcohol use: No    Alcohol/week: 0.0 standard drinks of alcohol   Drug  use: No   Sexual activity: Yes    Birth control/protection: None  Other Topics Concern   Not on file  Social History Narrative   Right handed   Drinks caffeine   Two story home   Social Determinants of Health   Financial Resource Strain: Low Risk  (02/17/2022)   Overall Financial Resource Strain (CARDIA)    Difficulty of Paying Living Expenses: Not hard at all  Food Insecurity: No Food Insecurity (02/17/2022)   Hunger Vital Sign    Worried About Running Out of Food in the Last Year: Never true    Ran Out of Food in the Last Year: Never true  Transportation Needs: No Transportation Needs (02/17/2022)   PRAPARE - Administrator, Civil Service (Medical): No    Lack of Transportation (Non-Medical): No  Physical Activity: Sufficiently Active (02/17/2022)   Exercise Vital Sign    Days of Exercise per Week: 5 days    Minutes of Exercise per Session: 60 min  Stress: No Stress Concern Present (02/17/2022)   [employer] of Occupational Health - Occupational Stress Questionnaire    Feeling of Stress : Not at all  Social Connections: Moderately Isolated (02/17/2022)   Social Connection and Isolation Panel [NHANES]    Frequency of Communication with Friends and Family: More than three times a week    Frequency of Social Gatherings with Friends and Family: More than three times a week    Attends Religious Services: Never    Database administrator or Organizations: No    Attends Banker Meetings: Never    Marital Status: Married  Catering manager Violence: Unknown (02/17/2022)   Humiliation, Afraid, Rape, and Kick questionnaire    Fear of Current or Ex-Partner: No    Emotionally Abused: Not on file    Physically Abused: No  Sexually Abused: No    Family History  Problem Relation Age of Onset   CAD Father        Objective: Vitals:   10/15/22 1504  BP: 130/73  Pulse: 65     Physical Exam Vitals reviewed.  Constitutional:      Appearance: Normal  appearance.  Neurological:     Mental Status: He is alert.     Lab Results:  PSA No results found for: "PSA" No results found for: "TESTOSTERONE" UA is clear.    Studies/Results: No results found. CT Abdomen Pelvis Wo Contrast  Result Date: 09/05/2022 CLINICAL DATA:  Right inguinal hernia. EXAM: CT ABDOMEN AND PELVIS WITHOUT CONTRAST TECHNIQUE: Multidetector CT imaging of the abdomen and pelvis was performed following the standard protocol without IV contrast. RADIATION DOSE REDUCTION: This exam was performed according to the departmental dose-optimization program which includes automated exposure control, adjustment of the mA and/or kV according to patient size and/or use of iterative reconstruction technique. COMPARISON:  CT abdomen pelvis dated 09/08/2017. FINDINGS: Evaluation of this exam is limited in the absence of intravenous contrast. Lower chest: Bibasilar subpleural linear atelectasis/scarring. The visualized lung bases are otherwise there is coronary vascular calcification and postsurgical changes of CABG. There is calcification of the mitral annulus. No intra-abdominal free air or free fluid. Hepatobiliary: The liver is unremarkable. No retention. Multiple stones in the gallbladder. No pericholecystic fluid or evidence of acute cholecystitis by CT. Pancreas: Unremarkable. No pancreatic ductal dilatation or surrounding inflammatory changes. Spleen: Normal in size without focal abnormality. Adrenals/Urinary Tract: The adrenal glands are unremarkable. There is no hydronephrosis or nephrolithiasis on either side. Small left renal parapelvic cysts. The visualized ureters and urinary bladder appear unremarkable. Stomach/Bowel: There is sigmoid diverticulosis without active inflammatory changes. There is similar appearance of a moderate size hiatal hernia containing a portion of the stomach. There is no bowel obstruction or active inflammation. The appendix is normal. Vascular/Lymphatic:  Advanced aortoiliac atherosclerotic disease. The IVC is unremarkable. No portal venous gas. There is no adenopathy. Reproductive: Prostate and seminal vesicles are grossly unremarkable. No pelvic mass. Other: Status post prior right inguinal hernia repair with plug. No recurrent hernia. There is a small fat containing right lateral ventral hernia. No bowel herniation. Musculoskeletal: Osteopenia with degenerative changes of the spine. No acute osseous pathology. IMPRESSION: 1. No acute intra-abdominal or pelvic pathology. Prior right inguinal hernia repair. No recurrent hernia. 2. Cholelithiasis. 3. Sigmoid diverticulosis. No bowel obstruction. Normal appendix. 4. Moderate size hiatal hernia containing a portion of the stomach, similar to prior CT. 5.  Aortic Atherosclerosis (ICD10-I70.0). Electronically Signed   By: Elgie Collard M.D.   On: 09/05/2022 21:59   DG Bone Survey Met  Result Date: 07/28/2022 CLINICAL DATA:  MGUS EXAM: METASTATIC BONE SURVEY COMPARISON:  Bone survey 09/03/2021 FINDINGS: 7 mm sclerotic lesion in the mid left humerus appears new from prior. No other sclerotic or lytic osseous lesions are identified. Degenerative changes affect the spine rib there are postsurgical changes in the right lower extremity. IMPRESSION: 7 mm sclerotic lesion in the mid left humerus appears new from prior. No other sclerotic or lytic osseous lesions are identified. Electronically Signed   By: Darliss Cheney M.D.   On: 07/28/2022 22:25    No results found for this or any previous visit.  No results found for this or any previous visit.  No results found for this or any previous visit.    No valid procedures specified. No results found for this or  any previous visit.    Assessment & Plan: He has OAB wet.   He is improved with the Myrbetriq and I will send a script.       Renal stones with chronic right flank pain.  He had no significant stones on CT.   Meds ordered this encounter   Medications   mirabegron ER (MYRBETRIQ) 25 MG TB24 tablet    Sig: Take 1 tablet (25 mg total) by mouth daily.    Dispense:  90 tablet    Refill:  3     Orders Placed This Encounter  Procedures   Urinalysis, Routine w reflex microscopic   BLADDER SCAN AMB NON-IMAGING      Return in about 1 year (around 10/15/2023).   CC: Anabel Halon, MD      Bjorn Pippin 5/3/2024Patient ID: Hillery Jacks, male   DOB: [birth year], 87 y.o.   MRN: [id number]

## 2022-10-26 ENCOUNTER — Ambulatory Visit (HOSPITAL_COMMUNITY)
Admission: RE | Admit: 2022-10-26 | Discharge: 2022-10-26 | Disposition: A | Discharge status: Home / Self Care | Payer: Medicare Other | Source: Ambulatory Visit | Attending: Physician Assistant | Admitting: Physician Assistant

## 2022-10-26 ENCOUNTER — Inpatient Hospital Stay: Payer: Medicare Other | Attending: Hematology

## 2022-10-26 DIAGNOSIS — D472 Monoclonal gammopathy: Secondary | ICD-10-CM | POA: Diagnosis present

## 2022-10-26 DIAGNOSIS — R936 Abnormal findings on diagnostic imaging of limbs: Secondary | ICD-10-CM | POA: Diagnosis present

## 2022-10-26 DIAGNOSIS — N400 Enlarged prostate without lower urinary tract symptoms: Secondary | ICD-10-CM | POA: Insufficient documentation

## 2022-10-26 DIAGNOSIS — D696 Thrombocytopenia, unspecified: Secondary | ICD-10-CM | POA: Diagnosis not present

## 2022-10-26 LAB — CBC WITH DIFFERENTIAL/PLATELET
Abs Immature Granulocytes: 0.01 10*3/uL (ref 0.00–0.07)
Basophils Absolute: 0 10*3/uL (ref 0.0–0.1)
Basophils Relative: 1 %
Eosinophils Absolute: 0.2 10*3/uL (ref 0.0–0.5)
Eosinophils Relative: 4 %
HCT: 47.8 % (ref 39.0–52.0)
Hemoglobin: 15.4 g/dL (ref 13.0–17.0)
Immature Granulocytes: 0 %
Lymphocytes Relative: 38 %
Lymphs Abs: 2.1 10*3/uL (ref 0.7–4.0)
MCH: 30 pg (ref 26.0–34.0)
MCHC: 32.2 g/dL (ref 30.0–36.0)
MCV: 93 fL (ref 80.0–100.0)
Monocytes Absolute: 0.6 10*3/uL (ref 0.1–1.0)
Monocytes Relative: 11 %
Neutro Abs: 2.6 10*3/uL (ref 1.7–7.7)
Neutrophils Relative %: 46 %
Platelets: 127 10*3/uL — ABNORMAL LOW (ref 150–400)
RBC: 5.14 MIL/uL (ref 4.22–5.81)
RDW: 13.3 % (ref 11.5–15.5)
WBC: 5.5 10*3/uL (ref 4.0–10.5)
nRBC: 0 % (ref 0.0–0.2)

## 2022-10-26 LAB — FOLATE: Folate: 11.5 ng/mL (ref >5.9)

## 2022-10-26 LAB — HEPATITIS B CORE ANTIBODY, TOTAL: Hep B Core Total Ab: NONREACTIVE

## 2022-10-26 LAB — PSA: Prostatic Specific Antigen: 1.2 ng/mL (ref 0.00–4.00)

## 2022-10-26 LAB — LACTATE DEHYDROGENASE: LDH: 163 U/L (ref 98–192)

## 2022-10-26 LAB — HEPATITIS B SURFACE ANTIGEN: Hepatitis B Surface Ag: NONREACTIVE

## 2022-10-26 LAB — VITAMIN B12: Vitamin B-12: 637 pg/mL (ref 180–914)

## 2022-10-26 LAB — HEPATITIS B SURFACE ANTIBODY,QUALITATIVE: Hep B S Ab: NONREACTIVE

## 2022-10-26 LAB — IMMATURE PLATELET FRACTION: Immature Platelet Fraction: 6.2 % (ref 1.2–8.6)

## 2022-10-27 LAB — COPPER, SERUM: Copper: 78 ug/dL (ref 69–132)

## 2022-10-28 LAB — RHEUMATOID FACTOR: Rheumatoid fact SerPl-aCnc: <10 IU/mL (ref <14.0)

## 2022-10-29 LAB — METHYLMALONIC ACID, SERUM: Methylmalonic Acid, Quantitative: 187 nmol/L (ref 0–378)

## 2022-10-29 LAB — ANTINUCLEAR ANTIBODIES, IFA: ANA Ab, IFA: NEGATIVE

## 2022-11-01 NOTE — Progress Notes (Unsigned)
[facility] [address], [place] [address]   CLINIC:  Medical Oncology/Hematology  PCP:  Anabel Halon, MD [address] [place] [address] [phone]   REASON FOR VISIT:  Follow-up for MGUS  PRIOR THERAPY: None  CURRENT THERAPY: Active surveillance  INTERVAL HISTORY:   Jorge Bishop 87 y.o. male returns for routine follow-up of MGUS.  He was last seen by Rojelio Brenner PA-C on 08/03/2022.  At today's visit, he reports feeling fairly ***.  No recent hospitalizations, surgeries, or changes in baseline health status.  He continues to have chronic peripheral neuropathy and imbalance issues, reports that he stopped taking gabapentin.  *** *** He reports some pain in his left hip, left shoulder, and right wrist. *** He denies any recent fractures. *** He denies any B symptoms such as fever, chills, night sweats, unintentional weight loss. ***He has some chronic tinnitus, hearing loss, and blurry vision; denies any acute neurologic changes.  He denies any new masses or lymphadenopathy.  Regarding his thrombocytopenia, he reports that he has had low platelets since around 2009 when he was hospitalized for bleeding ulcer and H. pylori.  *** *** He reports that he bruises and bleeds easily.  He reports that his gums will bleed when he "brushes hard." *** He has some pinkish discoloration to his sputum in the morning; no frank epistaxis or hemoptysis.  He has ***% energy and ***% appetite. He endorses that he is maintaining a stable weight.  ASSESSMENT & PLAN:  1.  IgG kappa MGUS - Worked up by neurology (Dr. Nita Sickle) for lower extremity neuropathy and worsening balance problems, which showed M spike positive - Hematology workup (09/03/2021): Immunofixation with IgG kappa monoclonal protein and SPEP 0.4. - Most recent skeletal survey (07/28/2022): 7 mm sclerotic lesion in mid left humerus appears new from prior.  No other sclerotic or lytic osseous  lesions are identified. - Follow-up imaging (10/26/2022) with x-ray of left humerus: Somewhat faint sclerotic lesion in left humeral shaft unchanged from 07/27/2022 - Most recent MGUS/myeloma panel (07/27/2022) M spike 0.5% Light chains essentially stable (elevated kappa 41.4, normal lambda, elevated ratio 2.31) No CRAB features: Hemoglobin 15.6, creatinine 0.99, calcium 9.4.  LDH normal. - Continues to report chronic neuropathy, balance issues, tinnitus, hearing loss, and blurry vision.*** - He reports pain around his left hip, left shoulder, and right wrist.  (Skeletal survey negative for any lytic lesions at these locations)*** - Reports 30 pound weight loss over the past 2 years due to decreased appetite.  He has lost about 6 pounds in the past 4 months.*** - PLAN:  Next MGUS/myeloma panel due August 2024. - Full body skeletal survey due February 2025, will request attention to sclerotic lesion of left humerus  2.  Thrombocytopenia - Patient reports that he has had low platelets since around 2009, when he was hospitalized for bleeding ulcer and H. pylori. - Hematology workup (10/26/2022): Normal LDH.  Negative ANA and rheumatoid factor. Normal B12, MMA, folate, copper. Hepatitis B negative. - Most recent CBC/differential (10/26/2022): Platelets mildly low, but stable at 127. -Reports easy bruising and bleeding.  He reports that his gums will bleed when he "brushes hard."  He has some pinkish discoloration to his sputum in the morning; no frank epistaxis or hemoptysis. - CT abdomen/pelvis (renal study) from 03/24/2019 showed normal spleen size  - No masses or lymphadenopathy on exam*** - Differential diagnosis includes mild chronic ITP versus early MDS *** - PLAN: Continue monitoring  of platelets.  (Additional labs were requested, but not obtained including hepatitis C antibody with reflex to PCR, HIV antibody, H. pylori antibody, and immature platelet fraction.)  We would consider bone marrow  biopsy if development of any B symptoms or major deviation from baseline.    3.  Peripheral neuropathy - Lower extremity neuropathy for the past several years - Taking gabapentin 100 mg daily, unable to tolerate higher doses due to drowsiness  4.  Other history - Lives at home with his wife.  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  He is retired from [employer].  He was exposed to agent orange in [place].  He is a non-smoker.   PLAN SUMMARY: >> Labs in 3 months = CBC/D, CMP, LDH, SPEP, immunofixation, hepatitis C antibody with reflex to PCR, HIV antibody, H. pylori antibody, immature platelet fraction >> OFFICE visit in 3 months (1 week after labs)     REVIEW OF SYSTEMS:***  Review of Systems  Constitutional:  Positive for fatigue. Negative for appetite change, chills, diaphoresis, fever and unexpected weight change.  HENT:   Positive for trouble swallowing. Negative for lump/mass and nosebleeds.   Eyes:  Negative for eye problems.  Respiratory:  Positive for shortness of breath. Negative for cough and hemoptysis.   Cardiovascular:  Negative for chest pain, leg swelling and palpitations.  Gastrointestinal:  Positive for constipation. Negative for abdominal pain, blood in stool, diarrhea, nausea and vomiting.  Genitourinary:  Negative for hematuria.   Musculoskeletal:  Positive for arthralgias.  Skin: Negative.   Neurological:  Positive for dizziness, headaches and numbness. Negative for light-headedness.  Hematological:  Does not bruise/bleed easily.     PHYSICAL EXAM:***  ECOG PERFORMANCE STATUS: 1 - Symptomatic but completely ambulatory  There were no vitals filed for this visit. There were no vitals filed for this visit. Physical Exam Vitals reviewed.  Constitutional:      Appearance: Normal appearance.  Cardiovascular:     Rate and Rhythm: Regular rhythm.     Heart sounds: Normal heart sounds.  Pulmonary:     Breath sounds: Normal breath sounds.   Skin:    Findings: Bruising present.  Neurological:     Mental Status: He is alert.  Psychiatric:        Mood and Affect: Mood normal.        Behavior: Behavior normal.   PAST MEDICAL/SURGICAL HISTORY:  Past Medical History:  Diagnosis Date   Arteriosclerotic cardiovascular disease (ASCVD)    Coronary artery bypass graft surgery in 12/98; DES to the RCA SVG in 07/2002; restenosis of the saphenous vein graft stent and renal intervention in 2006; 70% first diagonal present; calf in 11/2007-total obstruction of the saphenous vein graft to the first marginal   Benign prostatic hypertrophy    Cerebrovascular disease    60% bilateral internal carotid artery stenosis in 2012   Coronary artery disease    4 stents   Diaphragmatic hernia 01/15/2021   Diverticulosis    DJD (degenerative joint disease)    Erectile dysfunction    GERD (gastroesophageal reflux disease)    History of blood transfusion    History of kidney stones    Hyperlipidemia    Lipid profile in 12/2009:104, 91, 36, 50   Hypertension    Kidney stone 09/08/2017   Migraine headache    Right bundle branch block    Right ureteral stone 03/08/2019   Shortness of breath    Thrombocytopenia (HCC)  platelets of 104 in 03/2009   Upper gastrointestinal hemorrhage 10/09    Helicobacter pylori positive; subsequent laparoscopic fundoplication-2009   Past Surgical History:  Procedure Laterality Date   CARDIAC CATHETERIZATION     4 cardiac stents   CATARACT EXTRACTION W/PHACO Right 07/25/2012   Procedure: CATARACT EXTRACTION PHACO AND INTRAOCULAR LENS PLACEMENT (IOC);  Surgeon: Gemma Payor, MD;  Location: [facility];  Service: Ophthalmology;  Laterality: Right;  CDE:23.31   CATARACT EXTRACTION W/PHACO Left 08/18/2012   Procedure: CATARACT EXTRACTION PHACO AND INTRAOCULAR LENS PLACEMENT (IOC);  Surgeon: Gemma Payor, MD;  Location: [facility];  Service: Ophthalmology;  Laterality: Left;  CDE: 16.07   COLONOSCOPY N/A 08/01/2014   Procedure:  COLONOSCOPY;  Surgeon: Malissa Hippo, MD;  Location: [facility];  Service: Endoscopy;  Laterality: N/A;  125 - moved to 10:30 - Ann to notify pt   CORONARY ARTERY BYPASS GRAFT  1998    CYSTOSCOPY/URETEROSCOPY/HOLMIUM LASER/STENT PLACEMENT Right 11/12/2017   Procedure: CYSTOSCOPY/RIGHT RETROGRADE PYELOGRAM/RIGHT URETEROSCOPY/HOLMIUM LASER LITHOTRIPSY RIGHT URETERAL CALCULUS/RIGHT URETERAL STENT PLACEMENT;  Surgeon: Bjorn Pippin, MD;  Location: [facility];  Service: Urology;  Laterality: Right;  right ureteral calculus given to patient's wife per Dr. Annabell Howells   CYSTOSCOPY/URETEROSCOPY/HOLMIUM LASER/STENT PLACEMENT Right 03/07/2019   Procedure: CYSTOSCOPY RIGHT RETROGRADE RIGHT URETEROSCOPY;  Surgeon: Bjorn Pippin, MD;  Location: [facility];  Service: Urology;  Laterality: Right;   EXTRACORPOREAL SHOCK WAVE LITHOTRIPSY Right 09/13/2017   Procedure: RIGHT EXTRACORPOREAL SHOCK WAVE LITHOTRIPSY (ESWL);  Surgeon: Crista Elliot, MD;  Location: [facility];  Service: Urology;  Laterality: Right;   HERNIA REPAIR     right inguinal   LAPAROSCOPIC NISSEN FUNDOPLICATION  1997, 2009    revision in 2009 along with repair of paraesophagel hernia    PARAESOPHAGEAL HERNIA REPAIR     TRANSURETHRAL RESECTION OF PROSTATE N/A 03/07/2019   Procedure: TRANSURETHRAL RESECTION OF THE PROSTATE (TURP);  Surgeon: Bjorn Pippin, MD;  Location: [facility];  Service: Urology;  Laterality: N/A;    SOCIAL HISTORY:  Social History   Socioeconomic History   Marital status: Married    Spouse name: Not on file   Number of children: Not on file   Years of education: Not on file   Highest education level: Not on file  Occupational History   Occupation: Full time    Employer: RETIRED  Tobacco Use   Smoking status: Never   Smokeless tobacco: Never  Vaping Use   Vaping Use: Never used  Substance and Sexual Activity   Alcohol use: No    Alcohol/week: 0.0 standard drinks of alcohol   Drug use: No   Sexual activity: Yes    Birth  control/protection: None  Other Topics Concern   Not on file  Social History Narrative   Right handed   Drinks caffeine   Two story home   Social Determinants of Health   Financial Resource Strain: Low Risk  (02/17/2022)   Overall Financial Resource Strain (CARDIA)    Difficulty of Paying Living Expenses: Not hard at all  Food Insecurity: No Food Insecurity (02/17/2022)   Hunger Vital Sign    Worried About Running Out of Food in the Last Year: Never true    Ran Out of Food in the Last Year: Never true  Transportation Needs: No Transportation Needs (02/17/2022)   PRAPARE - Administrator, Civil Service (Medical): No    Lack of Transportation (Non-Medical): No  Physical Activity: Sufficiently Active (02/17/2022)   Exercise Vital Sign  Days of Exercise per Week: 5 days    Minutes of Exercise per Session: 60 min  Stress: No Stress Concern Present (02/17/2022)   [employer] of Occupational Health - Occupational Stress Questionnaire    Feeling of Stress : Not at all  Social Connections: Moderately Isolated (02/17/2022)   Social Connection and Isolation Panel [NHANES]    Frequency of Communication with Friends and Family: More than three times a week    Frequency of Social Gatherings with Friends and Family: More than three times a week    Attends Religious Services: Never    Database administrator or Organizations: No    Attends Banker Meetings: Never    Marital Status: Married  Catering manager Violence: Unknown (02/17/2022)   Humiliation, Afraid, Rape, and Kick questionnaire    Fear of Current or Ex-Partner: No    Emotionally Abused: Not on file    Physically Abused: No    Sexually Abused: No    FAMILY HISTORY:  Family History  Problem Relation Age of Onset   CAD Father     CURRENT MEDICATIONS:  Outpatient Encounter Medications as of 11/02/2022  Medication Sig   aspirin 81 MG tablet Take 81 mg by mouth daily.     atorvastatin (LIPITOR) 20 MG tablet  Take 20 mg by mouth at bedtime.     carvedilol (COREG) 6.25 MG tablet Take 1 tablet (6.25 mg total) by mouth 2 (two) times daily with a meal.   Cholecalciferol (VITAMIN D3) 50 MCG (2000 UT) TABS Take 10,000 Units by mouth daily.    cyanocobalamin 1000 MCG tablet Take 1,000 mcg by mouth daily.   FIBER PO Take 2 capsules by mouth daily as needed (constipation).   fluorometholone (FML) 0.1 % ophthalmic suspension SMARTSIG:1 Drop(s) In Eye(s) 2-4 Times Daily PRN   gabapentin (NEURONTIN) 100 MG capsule Take 1 capsule (100 mg total) by mouth 3 (three) times daily.   Lactulose 20 GM/30ML SOLN Take 30 mLs (20 g total) by mouth daily as needed (Constipation).   mirabegron ER (MYRBETRIQ) 25 MG TB24 tablet Take 1 tablet (25 mg total) by mouth daily.   nitroGLYCERIN (NITROSTAT) 0.4 MG SL tablet Place 1 tablet (0.4 mg total) under the tongue every 5 (five) minutes as needed for chest pain.   ondansetron (ZOFRAN) 4 MG tablet Take 1 tablet (4 mg total) by mouth every 8 (eight) hours as needed for nausea or vomiting.   pantoprazole (PROTONIX) 20 MG tablet TAKE 1 TABLET DAILY   promethazine (PHENERGAN) 25 MG tablet Take 25 mg by mouth every 6 (six) hours as needed for nausea or vomiting.   SYNTHROID 50 MCG tablet Take 50 mcg by mouth daily.   traMADol (ULTRAM) 50 MG tablet Take 50 mg by mouth every 6 (six) hours as needed (pain).   No facility-administered encounter medications on file as of 11/02/2022.    ALLERGIES:  Allergies  Allergen Reactions   Bactrim [Sulfamethoxazole-Trimethoprim] Anaphylaxis   Clarithromycin Anaphylaxis    REACTION: UNKNOWN REACTION   Codeine Nausea And Vomiting   Erythromycin Anaphylaxis   Sulfa Antibiotics Anaphylaxis   Trimethoprim     LABORATORY DATA:  I have reviewed the labs as listed.  CBC    Component Value Date/Time   WBC 5.5 10/26/2022 1402   RBC 5.14 10/26/2022 1402   HGB 15.4 10/26/2022 1402   HGB 15.2 09/15/2021 1135   HCT 47.8 10/26/2022 1402   HCT 46.4  09/15/2021 1135   PLT 127 (L) 10/26/2022  1402   PLT 139 (L) 09/15/2021 1135   MCV 93.0 10/26/2022 1402   MCV 89 09/15/2021 1135   MCH 30.0 10/26/2022 1402   MCHC 32.2 10/26/2022 1402   RDW 13.3 10/26/2022 1402   RDW 12.4 09/15/2021 1135   LYMPHSABS 2.1 10/26/2022 1402   LYMPHSABS 1.5 09/15/2021 1135   MONOABS 0.6 10/26/2022 1402   EOSABS 0.2 10/26/2022 1402   EOSABS 0.2 09/15/2021 1135   BASOSABS 0.0 10/26/2022 1402   BASOSABS 0.0 09/15/2021 1135      Latest Ref Rng & Units 07/27/2022    2:30 PM 04/07/2022    1:39 PM 01/20/2022    1:29 PM  CMP  Glucose 70 - 99 mg/dL 086  578  469   BUN 8 - 23 mg/dL 21  15  20    Creatinine 0.61 - 1.24 mg/dL 6.29  5.28  4.13   Sodium 135 - 145 mmol/L 138  141  139   Potassium 3.5 - 5.1 mmol/L 4.9  4.9  4.8   Chloride 98 - 111 mmol/L 104  103  106   CO2 22 - 32 mmol/L 28  24  29    Calcium 8.9 - 10.3 mg/dL 9.4  9.2  9.3   Total Protein 6.5 - 8.1 g/dL 7.2  6.9  7.1   Total Bilirubin 0.3 - 1.2 mg/dL 1.4  0.7  1.1   Alkaline Phos 38 - 126 U/L 62  80  62   AST 15 - 41 U/L 25  21  24    ALT 0 - 44 U/L 24  21  24      DIAGNOSTIC IMAGING:  I have independently reviewed the relevant imaging and discussed with the patient.   WRAP UP:  All questions were answered. The patient knows to call the clinic with any problems, questions or concerns.  Medical decision making: Moderate  Time spent on visit: I spent 25 minutes counseling the patient face to face. The total time spent in the appointment was 40 minutes and more than 50% was on counseling.  Carnella Guadalajara, PA-C  ***

## 2022-11-02 ENCOUNTER — Inpatient Hospital Stay (HOSPITAL_BASED_OUTPATIENT_CLINIC_OR_DEPARTMENT_OTHER): Payer: Medicare Other | Admitting: Physician Assistant

## 2022-11-02 VITALS — BP 126/76 | HR 69 | Temp 97.9°F | Resp 16 | Wt 172.4 lb

## 2022-11-02 DIAGNOSIS — D696 Thrombocytopenia, unspecified: Secondary | ICD-10-CM | POA: Diagnosis not present

## 2022-11-02 DIAGNOSIS — D472 Monoclonal gammopathy: Secondary | ICD-10-CM

## 2022-11-02 DIAGNOSIS — R936 Abnormal findings on diagnostic imaging of limbs: Secondary | ICD-10-CM | POA: Diagnosis not present

## 2022-11-02 NOTE — Patient Instructions (Signed)
[facility] at [facility] **VISIT SUMMARY & IMPORTANT INSTRUCTIONS **   You were seen today by Rojelio Brenner PA-C for your follow-up visit.    MGUS As we discussed, MGUS is a precancerous condition where your body has abnormal plasma cells that make too many of a certain type of immunoglobulin. At this time, the elevation in your immunoglobulin is very mild and is not causing any issues. Will continue to keep watch on these labs every 6 months (next due in August 2024) We will check a follow-up x-ray of the left humerus in 3 months.  LOW PLATELETS Your labs did not show any obvious cause of low platelets.  Your low platelets may be a sign of underlying bone marrow disorder or immune system dysfunction causing your body to attack its own platelets. Since your platelet levels are only mildly low, you do not need any invasive testing (i.e. bone marrow biopsy) or treatment at this time. We will continue to monitor closely.  FOLLOW-UP APPOINTMENT: Office visit in 3 months (1 week after labs and x-ray)  ** Thank you for trusting me with your healthcare!  I strive to provide all of my patients with quality care at each visit.  If you receive a survey for this visit, I would be so grateful to you for taking the time to provide feedback.  Thank you in advance!  ~ Camauri Fleece                   Dr. Doreatha Massed   &   Rojelio Brenner, PA-C   - - - - - - - - - - - - - - - - - -    Thank you for choosing [facility] at [facility] to provide your oncology and hematology care.  To afford each patient quality time with our provider, please arrive at least 15 minutes before your scheduled appointment time.   If you have a lab appointment with the Cancer Center please come in thru the Main Entrance and check in at the main information desk.  You need to re-schedule your appointment should you arrive 10 or more minutes late.  We strive to give you  quality time with our providers, and arriving late affects you and other patients whose appointments are after yours.  Also, if you no show three or more times for appointments you may be dismissed from the clinic at the providers discretion.     Again, thank you for choosing [facility].  Our hope is that these requests will decrease the amount of time that you wait before being seen by our physicians.       _____________________________________________________________  Should you have questions after your visit to [facility], please contact our office at [phone] and follow the prompts.  Our office hours are 8:00 a.m. and 4:30 p.m. Monday - Friday.  Please note that voicemails left after 4:00 p.m. may not be returned until the following business day.  We are closed weekends and major holidays.  You do have access to a nurse 24-7, just call the main number to the clinic [phone] and do not press any options, hold on the line and a nurse will answer the phone.    For prescription refill requests, have your pharmacy contact our office and allow 72 hours.

## 2022-11-03 ENCOUNTER — Other Ambulatory Visit: Payer: Self-pay

## 2022-11-03 DIAGNOSIS — R936 Abnormal findings on diagnostic imaging of limbs: Secondary | ICD-10-CM

## 2022-11-03 DIAGNOSIS — D472 Monoclonal gammopathy: Secondary | ICD-10-CM

## 2022-11-03 DIAGNOSIS — D696 Thrombocytopenia, unspecified: Secondary | ICD-10-CM

## 2022-11-18 ENCOUNTER — Ambulatory Visit (INDEPENDENT_AMBULATORY_CARE_PROVIDER_SITE_OTHER): Payer: Medicare Other | Admitting: Internal Medicine

## 2022-11-18 ENCOUNTER — Encounter: Payer: Self-pay | Admitting: Internal Medicine

## 2022-11-18 VITALS — BP 122/61 | HR 73 | Ht 72.0 in | Wt 173.6 lb

## 2022-11-18 DIAGNOSIS — I10 Essential (primary) hypertension: Secondary | ICD-10-CM

## 2022-11-18 DIAGNOSIS — G609 Hereditary and idiopathic neuropathy, unspecified: Secondary | ICD-10-CM

## 2022-11-18 DIAGNOSIS — K5909 Other constipation: Secondary | ICD-10-CM

## 2022-11-18 DIAGNOSIS — D472 Monoclonal gammopathy: Secondary | ICD-10-CM

## 2022-11-18 DIAGNOSIS — N401 Enlarged prostate with lower urinary tract symptoms: Secondary | ICD-10-CM

## 2022-11-18 DIAGNOSIS — E039 Hypothyroidism, unspecified: Secondary | ICD-10-CM

## 2022-11-18 DIAGNOSIS — D692 Other nonthrombocytopenic purpura: Secondary | ICD-10-CM | POA: Diagnosis not present

## 2022-11-18 DIAGNOSIS — D696 Thrombocytopenia, unspecified: Secondary | ICD-10-CM

## 2022-11-18 DIAGNOSIS — M170 Bilateral primary osteoarthritis of knee: Secondary | ICD-10-CM

## 2022-11-18 DIAGNOSIS — I2510 Atherosclerotic heart disease of native coronary artery without angina pectoris: Secondary | ICD-10-CM | POA: Diagnosis not present

## 2022-11-18 MED ORDER — DULOXETINE HCL 30 MG PO CPEP
30.0000 mg | ORAL_CAPSULE | Freq: Every day | ORAL | 3 refills | Status: DC
Start: 2022-11-18 — End: 2022-12-29

## 2022-11-18 MED ORDER — NITROGLYCERIN 0.4 MG SL SUBL
0.4000 mg | SUBLINGUAL_TABLET | SUBLINGUAL | 0 refills | Status: DC | PRN
Start: 2022-11-18 — End: 2024-03-22

## 2022-11-18 MED ORDER — LACTULOSE 20 GM/30ML PO SOLN: 30.0000 mL | Freq: Every day | ORAL | 1 refills | Status: DC | PRN

## 2022-11-18 NOTE — Assessment & Plan Note (Signed)
CABG in 12/98; DES to the RCA SVG in 07/2002; restenosis SVG stent and re-intervention in 2006; 70% residual D1; cath in 11/2007: TO SVG M1  Followed by Cardiology On Aspirin, statin and Coreg Denies any anginal symptoms currently 

## 2022-11-18 NOTE — Progress Notes (Signed)
Established Patient Office Visit  Subjective:  Patient ID: Jorge Bishop, male    DOB: 1935-08-24  Age: 87 y.o. MRN: [id number]  CC:  Chief Complaint  Patient presents with   Coronary Artery Disease    Follow up .   Hypothyroidism    Follow up    Knee Pain     Patient would like to be referred to Dr Dallas Schimke    HPI Jorge Bishop is a 87 y.o. male with past medical history of CAD s/p CABG, hypertension, HLD, prediabetes, hypothyroidism, MGUS, GERD, lumbar spinal stenosis s/p lumbar laminectomy and BPH who presents for f/u of his chronic medical conditions.  CAD s/p CABG and hypertension: BP is well-controlled. Takes medications regularly. Patient denies headache, dizziness, chest pain, dyspnea or palpitations.  He follows up with cardiology.  He is on Coreg, aspirin and atorvastatin.   MGUS: He was diagnosed with MGUS, and had a skeletal survey done, which showed 7 mm sclerotic lesion over left humerus.  Followed by oncology currently.  Neuropathy: He was started on gabapentin, but he felt drowsiness with 300 mg dose.  He was later placed on gabapentin 100 mg dose, but still had drowsiness.  He still complains of burning pain in his legs and feet.  Hypothyroidism: Takes levothyroxine.  Denies any recent change in weight or appetite.  Chronic constipation: He reports chronic constipation, but has improved with lactulose.  He denies any melena or hematochezia.  He has to strain hard while passing stool.  He has tried OTC stool softener-Dulcolax, MiraLAX, fiber supplement/Metamucil and probiotics without relief.  He has had to do manual disimpaction once as well.  Bilateral knee pain: He complains of bilateral knee pain with swelling.  His pain is worse upon standing and walking.  He has remote history of meniscal tear.  Denies any recent injury or fall.  Past Medical History:  Diagnosis Date   Arteriosclerotic cardiovascular disease (ASCVD)    Coronary artery bypass graft surgery in  12/98; DES to the RCA SVG in 07/2002; restenosis of the saphenous vein graft stent and renal intervention in 2006; 70% first diagonal present; calf in 11/2007-total obstruction of the saphenous vein graft to the first marginal   Benign prostatic hypertrophy    Cerebrovascular disease    60% bilateral internal carotid artery stenosis in 2012   Coronary artery disease    4 stents   Diaphragmatic hernia 01/15/2021   Diverticulosis    DJD (degenerative joint disease)    Erectile dysfunction    GERD (gastroesophageal reflux disease)    History of blood transfusion    History of kidney stones    Hyperlipidemia    Lipid profile in 12/2009:104, 91, 36, 50   Hypertension    Kidney stone 09/08/2017   Migraine headache    Right bundle branch block    Right ureteral stone 03/08/2019   Shortness of breath    Thrombocytopenia (HCC)    platelets of 104 in 03/2009   Upper gastrointestinal hemorrhage 10/09    Helicobacter pylori positive; subsequent laparoscopic fundoplication-2009    Past Surgical History:  Procedure Laterality Date   CARDIAC CATHETERIZATION     4 cardiac stents   CATARACT EXTRACTION W/PHACO Right 07/25/2012   Procedure: CATARACT EXTRACTION PHACO AND INTRAOCULAR LENS PLACEMENT (IOC);  Surgeon: Gemma Payor, MD;  Location: [facility];  Service: Ophthalmology;  Laterality: Right;  CDE:23.31   CATARACT EXTRACTION W/PHACO Left 08/18/2012   Procedure: CATARACT EXTRACTION PHACO AND INTRAOCULAR LENS PLACEMENT (IOC);  Surgeon: Gemma Payor, MD;  Location: [facility];  Service: Ophthalmology;  Laterality: Left;  CDE: 16.07   COLONOSCOPY N/A 08/01/2014   Procedure: COLONOSCOPY;  Surgeon: Malissa Hippo, MD;  Location: [facility];  Service: Endoscopy;  Laterality: N/A;  125 - moved to 10:30 - Ann to notify pt   CORONARY ARTERY BYPASS GRAFT  1998    CYSTOSCOPY/URETEROSCOPY/HOLMIUM LASER/STENT PLACEMENT Right 11/12/2017   Procedure: CYSTOSCOPY/RIGHT RETROGRADE PYELOGRAM/RIGHT URETEROSCOPY/HOLMIUM LASER  LITHOTRIPSY RIGHT URETERAL CALCULUS/RIGHT URETERAL STENT PLACEMENT;  Surgeon: Bjorn Pippin, MD;  Location: [facility];  Service: Urology;  Laterality: Right;  right ureteral calculus given to patient's wife per Dr. Annabell Howells   CYSTOSCOPY/URETEROSCOPY/HOLMIUM LASER/STENT PLACEMENT Right 03/07/2019   Procedure: CYSTOSCOPY RIGHT RETROGRADE RIGHT URETEROSCOPY;  Surgeon: Bjorn Pippin, MD;  Location: [facility];  Service: Urology;  Laterality: Right;   EXTRACORPOREAL SHOCK WAVE LITHOTRIPSY Right 09/13/2017   Procedure: RIGHT EXTRACORPOREAL SHOCK WAVE LITHOTRIPSY (ESWL);  Surgeon: Crista Elliot, MD;  Location: [facility];  Service: Urology;  Laterality: Right;   HERNIA REPAIR     right inguinal   LAPAROSCOPIC NISSEN FUNDOPLICATION  1997, 2009    revision in 2009 along with repair of paraesophagel hernia    PARAESOPHAGEAL HERNIA REPAIR     TRANSURETHRAL RESECTION OF PROSTATE N/A 03/07/2019   Procedure: TRANSURETHRAL RESECTION OF THE PROSTATE (TURP);  Surgeon: Bjorn Pippin, MD;  Location: [facility];  Service: Urology;  Laterality: N/A;    Family History  Problem Relation Age of Onset   CAD Father     Social History   Socioeconomic History   Marital status: Married    Spouse name: Not on file   Number of children: Not on file   Years of education: Not on file   Highest education level: Not on file  Occupational History   Occupation: Full time    Employer: RETIRED  Tobacco Use   Smoking status: Never   Smokeless tobacco: Never  Vaping Use   Vaping Use: Never used  Substance and Sexual Activity   Alcohol use: No    Alcohol/week: 0.0 standard drinks of alcohol   Drug use: No   Sexual activity: Yes    Birth control/protection: None  Other Topics Concern   Not on file  Social History Narrative   Right handed   Drinks caffeine   Two story home   Social Determinants of Health   Financial Resource Strain: Low Risk  (02/17/2022)   Overall Financial Resource Strain (CARDIA)    Difficulty of Paying Living  Expenses: Not hard at all  Food Insecurity: No Food Insecurity (02/17/2022)   Hunger Vital Sign    Worried About Running Out of Food in the Last Year: Never true    Ran Out of Food in the Last Year: Never true  Transportation Needs: No Transportation Needs (02/17/2022)   PRAPARE - Administrator, Civil Service (Medical): No    Lack of Transportation (Non-Medical): No  Physical Activity: Sufficiently Active (02/17/2022)   Exercise Vital Sign    Days of Exercise per Week: 5 days    Minutes of Exercise per Session: 60 min  Stress: No Stress Concern Present (02/17/2022)   [employer] of Occupational Health - Occupational Stress Questionnaire    Feeling of Stress : Not at all  Social Connections: Moderately Isolated (02/17/2022)   Social Connection and Isolation Panel [NHANES]    Frequency of Communication with Friends and Family: More than three times a week    Frequency  of Social Gatherings with Friends and Family: More than three times a week    Attends Religious Services: Never    Database administrator or Organizations: No    Attends Banker Meetings: Never    Marital Status: Married  Catering manager Violence: Unknown (02/17/2022)   Humiliation, Afraid, Rape, and Kick questionnaire    Fear of Current or Ex-Partner: No    Emotionally Abused: Not on file    Physically Abused: No    Sexually Abused: No    Outpatient Medications Prior to Visit  Medication Sig Dispense Refill   aspirin 81 MG tablet Take 81 mg by mouth daily.       atorvastatin (LIPITOR) 20 MG tablet Take 20 mg by mouth at bedtime.       carvedilol (COREG) 6.25 MG tablet Take 1 tablet (6.25 mg total) by mouth 2 (two) times daily with a meal. 180 tablet 3   Cholecalciferol (VITAMIN D3) 50 MCG (2000 UT) TABS Take 2,000 Units by mouth daily.     cyanocobalamin 1000 MCG tablet Take 1,000 mcg by mouth daily.     ondansetron (ZOFRAN) 4 MG tablet Take 1 tablet (4 mg total) by mouth every 8 (eight) hours  as needed for nausea or vomiting. 90 tablet 1   promethazine (PHENERGAN) 25 MG tablet Take 25 mg by mouth every 6 (six) hours as needed for nausea or vomiting.     SYNTHROID 50 MCG tablet Take 50 mcg by mouth daily.     traMADol (ULTRAM) 50 MG tablet Take 50 mg by mouth every 6 (six) hours as needed (pain).     Lactulose 20 GM/30ML SOLN Take 30 mLs (20 g total) by mouth daily as needed (Constipation). 450 mL 1   fluorometholone (FML) 0.1 % ophthalmic suspension SMARTSIG:1 Drop(s) In Eye(s) 2-4 Times Daily PRN     mirabegron ER (MYRBETRIQ) 25 MG TB24 tablet Take 1 tablet (25 mg total) by mouth daily. (Patient not taking: Reported on 11/18/2022) 90 tablet 3   FIBER PO Take 2 capsules by mouth daily as needed (constipation).     nitroGLYCERIN (NITROSTAT) 0.4 MG SL tablet Place 1 tablet (0.4 mg total) under the tongue every 5 (five) minutes as needed for chest pain. 90 tablet 3   pantoprazole (PROTONIX) 20 MG tablet TAKE 1 TABLET DAILY 90 tablet 0   No facility-administered medications prior to visit.    Allergies  Allergen Reactions   Bactrim [Sulfamethoxazole-Trimethoprim] Anaphylaxis   Clarithromycin Anaphylaxis    REACTION: UNKNOWN REACTION   Codeine Nausea And Vomiting   Erythromycin Anaphylaxis   Sulfa Antibiotics Anaphylaxis   Trimethoprim     ROS Review of Systems  Constitutional:  Negative for chills and fever.  HENT:  Negative for congestion and sore throat.   Eyes:  Negative for pain and discharge.  Respiratory:  Negative for cough and shortness of breath.   Cardiovascular:  Negative for chest pain and palpitations.  Gastrointestinal:  Positive for constipation. Negative for diarrhea, nausea and vomiting.  Endocrine: Negative for polydipsia and polyuria.  Genitourinary:  Negative for dysuria and hematuria.  Musculoskeletal:  Positive for arthralgias, back pain and gait problem. Negative for neck pain and neck stiffness.  Skin:  Negative for rash.  Neurological:  Positive for  numbness. Negative for dizziness, weakness and headaches.  Psychiatric/Behavioral:  Negative for agitation and behavioral problems.       Objective:    Physical Exam Vitals reviewed.  Constitutional:  General: He is not in acute distress.    Appearance: He is not diaphoretic.  HENT:     Head: Normocephalic and atraumatic.     Nose: Nose normal.     Mouth/Throat:     Mouth: Mucous membranes are moist.  Eyes:     General: No scleral icterus.    Extraocular Movements: Extraocular movements intact.  Cardiovascular:     Rate and Rhythm: Normal rate and regular rhythm.     Pulses: Normal pulses.     Heart sounds: Normal heart sounds. No murmur heard. Pulmonary:     Breath sounds: Normal breath sounds. No wheezing or rales.  Musculoskeletal:     Cervical back: Neck supple. No tenderness.     Right knee: Swelling present. Decreased range of motion.     Left knee: Swelling present. Decreased range of motion.     Right lower leg: No edema.     Left lower leg: No edema.  Skin:    General: Skin is warm and dry.     Findings: Bruising (B/l UE) present.  Neurological:     General: No focal deficit present.     Mental Status: He is alert and oriented to person, place, and time.     Sensory: Sensory deficit (B/l feet) present.     Motor: Weakness (B/l LE) present.     Gait: Gait abnormal.  Psychiatric:        Mood and Affect: Mood normal.        Behavior: Behavior normal.     BP 122/61 (BP Location: Right Arm, Patient Position: Sitting, Cuff Size: Normal)   Pulse 73   Ht 6' (1.829 m)   Wt 173 lb 9.6 oz (78.7 kg)   SpO2 97%   BMI 23.54 kg/m  Wt Readings from Last 3 Encounters:  11/18/22 173 lb 9.6 oz (78.7 kg)  11/02/22 172 lb 6.4 oz (78.2 kg)  10/15/22 172 lb (78 kg)    Lab Results  Component Value Date   TSH 2.220 04/07/2022   Lab Results  Component Value Date   WBC 5.5 10/26/2022   HGB 15.4 10/26/2022   HCT 47.8 10/26/2022   MCV 93.0 10/26/2022   PLT 127  (L) 10/26/2022   Lab Results  Component Value Date   NA 138 07/27/2022   K 4.9 07/27/2022   CO2 28 07/27/2022   GLUCOSE 113 (H) 07/27/2022   BUN 21 07/27/2022   CREATININE 0.99 07/27/2022   BILITOT 1.4 (H) 07/27/2022   ALKPHOS 62 07/27/2022   AST 25 07/27/2022   ALT 24 07/27/2022   PROT 7.2 07/27/2022   ALBUMIN 4.1 07/27/2022   CALCIUM 9.4 07/27/2022   ANIONGAP 6 07/27/2022   EGFR 80 04/07/2022   Lab Results  Component Value Date   CHOL 131 04/07/2022   Lab Results  Component Value Date   HDL 29 (L) 04/07/2022   Lab Results  Component Value Date   LDLCALC 77 04/07/2022   Lab Results  Component Value Date   TRIG 143 04/07/2022   Lab Results  Component Value Date   CHOLHDL 4.5 04/07/2022   Lab Results  Component Value Date   HGBA1C 5.9 (H) 09/15/2021      Assessment & Plan:   Problem List Items Addressed This Visit       Cardiovascular and Mediastinum   Hypertension - Primary    BP Readings from Last 1 Encounters:  11/18/22 122/61  Overall well-controlled with Coreg, considering his age Counseled  for compliance with the medications Advised DASH diet and moderate exercise/walking as tolerated      Relevant Medications   nitroGLYCERIN (NITROSTAT) 0.4 MG SL tablet   CAD (coronary artery disease)    CABG in 12/98; DES to the RCA SVG in 07/2002; restenosis SVG stent and re-intervention in 2006; 70% residual D1; cath in 11/2007: TO SVG M1  Followed by Cardiology On Aspirin, statin and Coreg Denies any anginal symptoms currently      Relevant Medications   nitroGLYCERIN (NITROSTAT) 0.4 MG SL tablet   Senile purpura (HCC)    Likely due to use of aspirin, reassured it being benign      Relevant Medications   nitroGLYCERIN (NITROSTAT) 0.4 MG SL tablet     Digestive   Chronic constipation    Chronic idiopathic constipation Has tried multiple OTC laxatives and stool softeners without much relief Also takes prunes Linzess was not covered, now better  with Lactulose Advised to maintain adequate hydration      Relevant Medications   Lactulose 20 GM/30ML SOLN     Endocrine   Hypothyroidism    On Levothyroxine 50 mcg QD Last TSH - wnl      Relevant Orders   TSH + free T4     Nervous and Auditory   Idiopathic peripheral neuropathy    Had drowsiness with gabapentin 100 mg dose Started Cymbalta 30 mg QD, adjust dose based on response Advised to take Vit B12 1000 mcg QD Has been evaluated by neurology      Relevant Medications   DULoxetine (CYMBALTA) 30 MG capsule     Musculoskeletal and Integument   Primary osteoarthritis of both knees    Tylenol PRN Avoid oral NSAIDs due to CAD Referred to Orthopedic surgery      Relevant Orders   Ambulatory referral to Orthopedic Surgery     Genitourinary   Benign localized prostatic hyperplasia with lower urinary tract symptoms (LUTS)    Has had prostate surgery, followed by Urology Was on Tamsulosin in the past Was recently given Myrbetriq for urge incontinence, but was not able to get it        Hematopoietic and Hemostatic   Thrombocytopenia (HCC)    Followed by hematology Has MGUS as well Has senile purpura, but could be due to aspirin as well        Other   MGUS (monoclonal gammopathy of unknown significance)    Followed by oncology Recently bone scan showed left humeral sclerotic lesion, plan to get x-ray of left humerus      Meds ordered this encounter  Medications   Lactulose 20 GM/30ML SOLN    Sig: Take 30 mLs (20 g total) by mouth daily as needed (Constipation).    Dispense:  450 mL    Refill:  1   DULoxetine (CYMBALTA) 30 MG capsule    Sig: Take 1 capsule (30 mg total) by mouth daily.    Dispense:  30 capsule    Refill:  3   nitroGLYCERIN (NITROSTAT) 0.4 MG SL tablet    Sig: Place 1 tablet (0.4 mg total) under the tongue every 5 (five) minutes as needed for chest pain.    Dispense:  30 tablet    Refill:  0    Follow-up: Return in about 4 months  (around 03/20/2023) for CAD and knee OA.    Anabel Halon, MD

## 2022-11-18 NOTE — Assessment & Plan Note (Signed)
Likely due to use of aspirin, reassured it being benign ?

## 2022-11-18 NOTE — Assessment & Plan Note (Signed)
Chronic idiopathic constipation Has tried multiple OTC laxatives and stool softeners without much relief Also takes prunes Linzess was not covered, now better with Lactulose Advised to maintain adequate hydration 

## 2022-11-18 NOTE — Patient Instructions (Signed)
Please take Coreg 6.25 mg twice daily.  Please continue to take medications as prescribed.  Please continue to follow low salt diet and ambulate as tolerated.

## 2022-11-18 NOTE — Assessment & Plan Note (Signed)
Tylenol PRN Avoid oral NSAIDs due to CAD Referred to Orthopedic surgery

## 2022-11-18 NOTE — Assessment & Plan Note (Signed)
Followed by oncology Recently bone scan showed left humeral sclerotic lesion, plan to get x-ray of left humerus  

## 2022-11-18 NOTE — Assessment & Plan Note (Addendum)
Followed by hematology Has MGUS as well Has senile purpura, but could be due to aspirin as well

## 2022-11-18 NOTE — Assessment & Plan Note (Signed)
Had drowsiness with gabapentin 100 mg dose Started Cymbalta 30 mg QD, adjust dose based on response Advised to take Vit B12 1000 mcg QD Has been evaluated by neurology

## 2022-11-18 NOTE — Assessment & Plan Note (Signed)
On Levothyroxine 50 mcg QD ?Last TSH - wnl ? ?

## 2022-11-18 NOTE — Assessment & Plan Note (Signed)
BP Readings from Last 1 Encounters:  11/18/22 122/61   Overall well-controlled with Coreg, considering his age Counseled for compliance with the medications Advised DASH diet and moderate exercise/walking as tolerated

## 2022-11-18 NOTE — Assessment & Plan Note (Signed)
Has had prostate surgery, followed by Urology Was on Tamsulosin in the past Was recently given Myrbetriq for urge incontinence, but was not able to get it

## 2022-11-24 ENCOUNTER — Ambulatory Visit (INDEPENDENT_AMBULATORY_CARE_PROVIDER_SITE_OTHER): Payer: Medicare Other | Admitting: Orthopedic Surgery

## 2022-11-24 ENCOUNTER — Other Ambulatory Visit (INDEPENDENT_AMBULATORY_CARE_PROVIDER_SITE_OTHER): Payer: Medicare Other

## 2022-11-24 ENCOUNTER — Encounter: Payer: Self-pay | Admitting: Orthopedic Surgery

## 2022-11-24 ENCOUNTER — Other Ambulatory Visit: Payer: Self-pay

## 2022-11-24 ENCOUNTER — Telehealth: Payer: Self-pay

## 2022-11-24 VITALS — BP 127/75 | HR 67 | Ht 72.0 in | Wt 170.0 lb

## 2022-11-24 DIAGNOSIS — M25561 Pain in right knee: Secondary | ICD-10-CM

## 2022-11-24 DIAGNOSIS — G8929 Other chronic pain: Secondary | ICD-10-CM | POA: Diagnosis not present

## 2022-11-24 DIAGNOSIS — M25562 Pain in left knee: Secondary | ICD-10-CM | POA: Diagnosis not present

## 2022-11-24 NOTE — Telephone Encounter (Signed)
VOB submitted for Orthovisc, bilateral knee 

## 2022-11-24 NOTE — Progress Notes (Signed)
New Patient Visit  Assessment: Jorge Bishop is a 87 y.o. male with the following: 1. Chronic pain of both knees  Plan: Hillery Jacks has pain in both knees.  He has noticed reduced stability in bilateral lower extremities.  He is unable to walk as well as he was not too long ago.  He is interested in physical therapy for improving his strength and stability.  He would also like to proceed with hyaluronic acid injections.  We will work to gain authorization, and schedule follow-up appointments to proceed with a series of gel injections.  All questions have been answered, and he is in agreement this plan.  Follow-up: Return for After Insurance Authorization for Injection.  Subjective:  Chief Complaint  Patient presents with   Knee Pain    Bilateral knee pain    History of Present Illness: Jorge Bishop is a 87 y.o. male who has been referred by  Trena Platt, MD for evaluation of bilateral knee pain.  He states he has had progressively worsening bilateral knee pain for months.  He notes progressive worsening strength and stability.  He does have a history of knee arthroscopy in both knees.  Pain is within the medial aspect of both knees.  He denies swelling.  He used to walk long distances every day.  However, he has limited ability to ambulate for longer periods of time.  No prior injections.  He is interested in some gel injections.  He is also interested in therapy.   Review of Systems: No fevers or chills No numbness or tingling No chest pain No shortness of breath No bowel or bladder dysfunction No GI distress No headaches   Medical History:  Past Medical History:  Diagnosis Date   Arteriosclerotic cardiovascular disease (ASCVD)    Coronary artery bypass graft surgery in 12/98; DES to the RCA SVG in 07/2002; restenosis of the saphenous vein graft stent and renal intervention in 2006; 70% first diagonal present; calf in 11/2007-total obstruction of the saphenous vein graft to  the first marginal   Benign prostatic hypertrophy    Cerebrovascular disease    60% bilateral internal carotid artery stenosis in 2012   Coronary artery disease    4 stents   Diaphragmatic hernia 01/15/2021   Diverticulosis    DJD (degenerative joint disease)    Erectile dysfunction    GERD (gastroesophageal reflux disease)    History of blood transfusion    History of kidney stones    Hyperlipidemia    Lipid profile in 12/2009:104, 91, 36, 50   Hypertension    Kidney stone 09/08/2017   Migraine headache    Right bundle branch block    Right ureteral stone 03/08/2019   Shortness of breath    Thrombocytopenia (HCC)    platelets of 104 in 03/2009   Upper gastrointestinal hemorrhage 10/09    Helicobacter pylori positive; subsequent laparoscopic fundoplication-2009    Past Surgical History:  Procedure Laterality Date   CARDIAC CATHETERIZATION     4 cardiac stents   CATARACT EXTRACTION W/PHACO Right 07/25/2012   Procedure: CATARACT EXTRACTION PHACO AND INTRAOCULAR LENS PLACEMENT (IOC);  Surgeon: Gemma Payor, MD;  Location: [facility];  Service: Ophthalmology;  Laterality: Right;  CDE:23.31   CATARACT EXTRACTION W/PHACO Left 08/18/2012   Procedure: CATARACT EXTRACTION PHACO AND INTRAOCULAR LENS PLACEMENT (IOC);  Surgeon: Gemma Payor, MD;  Location: [facility];  Service: Ophthalmology;  Laterality: Left;  CDE: 16.07   COLONOSCOPY N/A 08/01/2014   Procedure: COLONOSCOPY;  Surgeon: Malissa Hippo, MD;  [facility];  Service: Endoscopy;  Laterality: N/A;  125 - moved to 10:30 - Ann to notify pt   CORONARY ARTERY BYPASS GRAFT  1998    CYSTOSCOPY/URETEROSCOPY/HOLMIUM LASER/STENT PLACEMENT Right 11/12/2017   Procedure: CYSTOSCOPY/RIGHT RETROGRADE PYELOGRAM/RIGHT URETEROSCOPY/HOLMIUM LASER LITHOTRIPSY RIGHT URETERAL CALCULUS/RIGHT URETERAL STENT PLACEMENT;  Surgeon: Bjorn Pippin, MD;  Location: [facility];  Service: Urology;  Laterality: Right;  right ureteral calculus given to patient's wife per Dr.  Annabell Howells   CYSTOSCOPY/URETEROSCOPY/HOLMIUM LASER/STENT PLACEMENT Right 03/07/2019   Procedure: CYSTOSCOPY RIGHT RETROGRADE RIGHT URETEROSCOPY;  Surgeon: Bjorn Pippin, MD;  Location: [facility];  Service: Urology;  Laterality: Right;   EXTRACORPOREAL SHOCK WAVE LITHOTRIPSY Right 09/13/2017   Procedure: RIGHT EXTRACORPOREAL SHOCK WAVE LITHOTRIPSY (ESWL);  Surgeon: Crista Elliot, MD;  Location: [facility];  Service: Urology;  Laterality: Right;   HERNIA REPAIR     right inguinal   LAPAROSCOPIC NISSEN FUNDOPLICATION  1997, 2009    revision in 2009 along with repair of paraesophagel hernia    PARAESOPHAGEAL HERNIA REPAIR     TRANSURETHRAL RESECTION OF PROSTATE N/A 03/07/2019   Procedure: TRANSURETHRAL RESECTION OF THE PROSTATE (TURP);  Surgeon: Bjorn Pippin, MD;  Location: [facility];  Service: Urology;  Laterality: N/A;    Family History  Problem Relation Age of Onset   CAD Father    Social History   Tobacco Use   Smoking status: Never   Smokeless tobacco: Never  Vaping Use   Vaping Use: Never used  Substance Use Topics   Alcohol use: No    Alcohol/week: 0.0 standard drinks of alcohol   Drug use: No    Allergies  Allergen Reactions   Bactrim [Sulfamethoxazole-Trimethoprim] Anaphylaxis   Clarithromycin Anaphylaxis    REACTION: UNKNOWN REACTION   Codeine Nausea And Vomiting   Erythromycin Anaphylaxis   Sulfa Antibiotics Anaphylaxis   Trimethoprim     Current Meds  Medication Sig   aspirin 81 MG tablet Take 81 mg by mouth daily.     atorvastatin (LIPITOR) 20 MG tablet Take 20 mg by mouth at bedtime.     carvedilol (COREG) 6.25 MG tablet Take 1 tablet (6.25 mg total) by mouth 2 (two) times daily with a meal.   Cholecalciferol (VITAMIN D3) 50 MCG (2000 UT) TABS Take 2,000 Units by mouth daily.   cyanocobalamin 1000 MCG tablet Take 1,000 mcg by mouth daily.   DULoxetine (CYMBALTA) 30 MG capsule Take 1 capsule (30 mg total) by mouth daily.   fluorometholone (FML) 0.1 % ophthalmic suspension  SMARTSIG:1 Drop(s) In Eye(s) 2-4 Times Daily PRN   Lactulose 20 GM/30ML SOLN Take 30 mLs (20 g total) by mouth daily as needed (Constipation).   mirabegron ER (MYRBETRIQ) 25 MG TB24 tablet Take 1 tablet (25 mg total) by mouth daily.   nitroGLYCERIN (NITROSTAT) 0.4 MG SL tablet Place 1 tablet (0.4 mg total) under the tongue every 5 (five) minutes as needed for chest pain.   ondansetron (ZOFRAN) 4 MG tablet Take 1 tablet (4 mg total) by mouth every 8 (eight) hours as needed for nausea or vomiting.   promethazine (PHENERGAN) 25 MG tablet Take 25 mg by mouth every 6 (six) hours as needed for nausea or vomiting.   SYNTHROID 50 MCG tablet Take 50 mcg by mouth daily.   traMADol (ULTRAM) 50 MG tablet Take 50 mg by mouth every 6 (six) hours as needed (pain).    Objective: BP 127/75   Pulse 67   Ht  6' (1.829 m)   Wt 170 lb (77.1 kg)   BMI 23.06 kg/m   Physical Exam:  General: Elderly male., Alert and oriented., and No acute distress. Gait: Slow, steady gait.  Bilateral knees without swelling.  Is good range of motion.  No increased laxity to varus or valgus stress.  Tenderness palpation along the medial joint line.  Negative Lachman bilaterally.  IMAGING: I personally ordered and reviewed the following images   X-rays of bilateral knees demonstrates mild varus alignment.  Mild loss of joint space within the medial compartment.  Within the patellofemoral compartment, there are small osteophytes bilaterally.  No acute injuries are noted.  No bony lesions.  Decreased bone mineralization.  Impression: Bilateral mild to moderate knee arthritis   New Medications:  No orders of the defined types were placed in this encounter.     Oliver Barre, MD  11/24/2022 2:10 PM

## 2022-11-24 NOTE — Telephone Encounter (Signed)
-----   Message from Baird Kay, LPN sent at 02/11/5620  2:16 PM EDT ----- Regarding: HA Injection Hello,   This patient would like to receive HA injections bilateral knees. Please assist, thank you.

## 2022-12-04 ENCOUNTER — Telehealth: Payer: Self-pay

## 2022-12-04 ENCOUNTER — Other Ambulatory Visit: Payer: Self-pay

## 2022-12-04 DIAGNOSIS — G8929 Other chronic pain: Secondary | ICD-10-CM

## 2022-12-04 NOTE — Telephone Encounter (Signed)
Please schedule patient for gel injections with Dr. Dallas Schimke. All gel information can be found in chart under the referrals tab. Thank you

## 2022-12-09 ENCOUNTER — Ambulatory Visit: Payer: Medicare Other | Admitting: Internal Medicine

## 2022-12-11 ENCOUNTER — Telehealth: Payer: Self-pay | Admitting: Orthopedic Surgery

## 2022-12-11 NOTE — Telephone Encounter (Signed)
CAIRNS   Patient called and states he has not heard  nothing from the doctor or his "nurse" and he knows the insurance has approved him for his injections.    Please call him back at [phone]   Kathie Rhodes is going to call him back.

## 2022-12-11 NOTE — Telephone Encounter (Signed)
This patient should have been scheduled for appointments at time of call, does not need to talk to nurse or provider. Please schedule for appointments if patient wants to proceed with injections.

## 2022-12-14 ENCOUNTER — Ambulatory Visit: Payer: Medicare Other | Attending: Internal Medicine | Admitting: Internal Medicine

## 2022-12-14 ENCOUNTER — Encounter: Payer: Self-pay | Admitting: Internal Medicine

## 2022-12-14 VITALS — BP 136/72 | HR 69 | Ht 72.0 in | Wt 173.2 lb

## 2022-12-14 DIAGNOSIS — I2510 Atherosclerotic heart disease of native coronary artery without angina pectoris: Secondary | ICD-10-CM | POA: Diagnosis present

## 2022-12-14 DIAGNOSIS — I351 Nonrheumatic aortic (valve) insufficiency: Secondary | ICD-10-CM | POA: Diagnosis not present

## 2022-12-14 MED ORDER — CARVEDILOL 6.25 MG PO TABS: 6.2500 mg | ORAL_TABLET | Freq: Every day | ORAL | 2 refills | Status: DC

## 2022-12-14 NOTE — Patient Instructions (Signed)
Medication Instructions:  Your physician recommends that you continue on your current medications as directed. Please refer to the Current Medication list given to you today.   Labwork: None today  Testing/Procedures: Your physician has requested that you have an echocardiogram. Echocardiography is a painless test that uses sound waves to create images of your heart. It provides your doctor with information about the size and shape of your heart and how well your heart's chambers and valves are working. This procedure takes approximately one hour. There are no restrictions for this procedure. Please do NOT wear cologne, perfume, aftershave, or lotions (deodorant is allowed). Please arrive 15 minutes prior to your appointment time.   Follow-Up: 1 year  Any Other Special Instructions Will Be Listed Below (If Applicable).  If you need a refill on your cardiac medications before your next appointment, please call your pharmacy.  

## 2022-12-14 NOTE — Progress Notes (Signed)
Cardiology Office Note  Date: 12/14/2022   ID: TREYVONN EDGELL, DOB 29-Jun-1935, MRN [id number]  PCP:  Anabel Halon, MD  Cardiologist:  Marjo Bicker, MD Electrophysiologist:  None   Reason for Office Visit: F/u of CAD   History of Present Illness: Jorge Bishop is a 87 y.o. male known to have CAD s/p CABG in 1998, s/p DES to SVG-RCA, with restenosis in 2009 on medical management with normal LVEF, HTN, HLD, MGUS, mild carotid artery stenosis (less than 30%) is here for follow-up visit.  No angina or DOE.  No palpitations, dizziness, syncope, orthopnea, PND or leg swelling.  Compliant with medications and has no side effects.  Currently takes carvedilol 6.25 mg once daily and not twice daily.  He is tolerating well with no side effects.    Past Medical History:  Diagnosis Date   Arteriosclerotic cardiovascular disease (ASCVD)    Coronary artery bypass graft surgery in 12/98; DES to the RCA SVG in 07/2002; restenosis of the saphenous vein graft stent and renal intervention in 2006; 70% first diagonal present; calf in 11/2007-total obstruction of the saphenous vein graft to the first marginal   Benign prostatic hypertrophy    Cerebrovascular disease    60% bilateral internal carotid artery stenosis in 2012   Coronary artery disease    4 stents   Diaphragmatic hernia 01/15/2021   Diverticulosis    DJD (degenerative joint disease)    Erectile dysfunction    GERD (gastroesophageal reflux disease)    History of blood transfusion    History of kidney stones    Hyperlipidemia    Lipid profile in 12/2009:104, 91, 36, 50   Hypertension    Kidney stone 09/08/2017   Migraine headache    Right bundle branch block    Right ureteral stone 03/08/2019   Shortness of breath    Thrombocytopenia (HCC)    platelets of 104 in 03/2009   Upper gastrointestinal hemorrhage 10/09    Helicobacter pylori positive; subsequent laparoscopic fundoplication-2009    Past Surgical History:  Procedure  Laterality Date   CARDIAC CATHETERIZATION     4 cardiac stents   CATARACT EXTRACTION W/PHACO Right 07/25/2012   Procedure: CATARACT EXTRACTION PHACO AND INTRAOCULAR LENS PLACEMENT (IOC);  Surgeon: Gemma Payor, MD;  Location: [facility];  Service: Ophthalmology;  Laterality: Right;  CDE:23.31   CATARACT EXTRACTION W/PHACO Left 08/18/2012   Procedure: CATARACT EXTRACTION PHACO AND INTRAOCULAR LENS PLACEMENT (IOC);  Surgeon: Gemma Payor, MD;  Location: [facility];  Service: Ophthalmology;  Laterality: Left;  CDE: 16.07   COLONOSCOPY N/A 08/01/2014   Procedure: COLONOSCOPY;  Surgeon: Malissa Hippo, MD;  [facility];  Service: Endoscopy;  Laterality: N/A;  125 - moved to 10:30 - Ann to notify pt   CORONARY ARTERY BYPASS GRAFT  1998    CYSTOSCOPY/URETEROSCOPY/HOLMIUM LASER/STENT PLACEMENT Right 11/12/2017   Procedure: CYSTOSCOPY/RIGHT RETROGRADE PYELOGRAM/RIGHT URETEROSCOPY/HOLMIUM LASER LITHOTRIPSY RIGHT URETERAL CALCULUS/RIGHT URETERAL STENT PLACEMENT;  Surgeon: Bjorn Pippin, MD;  Location: [facility];  Service: Urology;  Laterality: Right;  right ureteral calculus given to patient's wife per Dr. Annabell Howells   CYSTOSCOPY/URETEROSCOPY/HOLMIUM LASER/STENT PLACEMENT Right 03/07/2019   Procedure: CYSTOSCOPY RIGHT RETROGRADE RIGHT URETEROSCOPY;  Surgeon: Bjorn Pippin, MD;  Location: [facility];  Service: Urology;  Laterality: Right;   EXTRACORPOREAL SHOCK WAVE LITHOTRIPSY Right 09/13/2017   Procedure: RIGHT EXTRACORPOREAL SHOCK WAVE LITHOTRIPSY (ESWL);  Surgeon: Crista Elliot, MD;  Location: [facility];  Service: Urology;  Laterality: Right;   HERNIA  REPAIR     right inguinal   LAPAROSCOPIC NISSEN FUNDOPLICATION  1997, 2009    revision in 2009 along with repair of paraesophagel hernia    PARAESOPHAGEAL HERNIA REPAIR     TRANSURETHRAL RESECTION OF PROSTATE N/A 03/07/2019   Procedure: TRANSURETHRAL RESECTION OF THE PROSTATE (TURP);  Surgeon: Bjorn Pippin, MD;  Location: [facility];  Service: Urology;  Laterality: N/A;     Current Outpatient Medications  Medication Sig Dispense Refill   aspirin 81 MG tablet Take 81 mg by mouth daily.       atorvastatin (LIPITOR) 20 MG tablet Take 20 mg by mouth at bedtime.       carvedilol (COREG) 6.25 MG tablet Take 6.25 mg by mouth daily.     Cholecalciferol (VITAMIN D3) 50 MCG (2000 UT) TABS Take 2,000 Units by mouth daily.     cyanocobalamin 1000 MCG tablet Take 1,000 mcg by mouth daily.     DULoxetine (CYMBALTA) 30 MG capsule Take 1 capsule (30 mg total) by mouth daily. 30 capsule 3   fluorometholone (FML) 0.1 % ophthalmic suspension SMARTSIG:1 Drop(s) In Eye(s) 2-4 Times Daily PRN     Lactulose 20 GM/30ML SOLN Take 30 mLs (20 g total) by mouth daily as needed (Constipation). 450 mL 1   nitroGLYCERIN (NITROSTAT) 0.4 MG SL tablet Place 1 tablet (0.4 mg total) under the tongue every 5 (five) minutes as needed for chest pain. 30 tablet 0   ondansetron (ZOFRAN) 4 MG tablet Take 1 tablet (4 mg total) by mouth every 8 (eight) hours as needed for nausea or vomiting. 90 tablet 1   promethazine (PHENERGAN) 25 MG tablet Take 25 mg by mouth every 6 (six) hours as needed for nausea or vomiting.     SYNTHROID 50 MCG tablet Take 50 mcg by mouth daily.     traMADol (ULTRAM) 50 MG tablet Take 50 mg by mouth every 6 (six) hours as needed (pain).     No current facility-administered medications for this visit.   Allergies:  Bactrim [sulfamethoxazole-trimethoprim], Clarithromycin, Codeine, Erythromycin, Sulfa antibiotics, and Trimethoprim   Social History: The patient  reports that he has never smoked. He has never used smokeless tobacco. He reports that he does not drink alcohol and does not use drugs.   Family History: The patient's family history includes CAD in his father.   ROS:  Please see the history of present illness. Otherwise, complete review of systems is positive for none.  All other systems are reviewed and negative.   Physical Exam: VS:  BP 136/72   Pulse 69   Ht 6'  (1.829 m)   Wt 173 lb 3.2 oz (78.6 kg)   SpO2 96%   BMI 23.49 kg/m , BMI Body mass index is 23.49 kg/m.  Wt Readings from Last 3 Encounters:  12/14/22 173 lb 3.2 oz (78.6 kg)  11/24/22 170 lb (77.1 kg)  11/18/22 173 lb 9.6 oz (78.7 kg)    General: Patient appears comfortable at rest. HEENT: Conjunctiva and lids normal, oropharynx clear with moist mucosa. Neck: Supple, no elevated JVP or carotid bruits, no thyromegaly. Lungs: Clear to auscultation, nonlabored breathing at rest. Cardiac: Regular rate and rhythm, no S3 or significant systolic murmur, no pericardial rub. Abdomen: Soft, nontender, no hepatomegaly, bowel sounds present, no guarding or rebound. Extremities: No pitting edema, distal pulses 2+. Skin: Warm and dry. Musculoskeletal: No kyphosis. Neuropsychiatric: Alert and oriented x3, affect grossly appropriate.  Recent Labwork: 04/07/2022: TSH 2.220 07/27/2022: ALT 24; AST 25; BUN  21; Creatinine, Ser 0.99; Potassium 4.9; Sodium 138 10/26/2022: Hemoglobin 15.4; Platelets 127     Component Value Date/Time   CHOL 131 04/07/2022 1339   TRIG 143 04/07/2022 1339   HDL 29 (L) 04/07/2022 1339   CHOLHDL 4.5 04/07/2022 1339   CHOLHDL 3.5 03/02/2019 1039   VLDL 14 03/02/2019 1039   LDLCALC 77 04/07/2022 1339     Assessment and Plan:  # CAD s/p CABG in 1998, s/p DES to SVG-RCA with restenosis in 2009 on medical management and normal LVEF, currently angina free -Continue aspirin 81 mg once daily and atorvastatin 20 mg nightly -ER precautions for chest pain  # HLD, at goal -Lipid panel around 8 months ago showed LDL 77, TG 143.  Continue atorvastatin 20 mg nightly. He was previously on Zetia which was discontinued as per his request.  # Mild AI in 2019 - Repeat Echo  # Mild CAS - Continue cardioprotective meds as stated above  # HTN, controlled -Continue carvedilol 6.25 mg once daily (supposed to take it twice daily but will keep it at the current frequency as he is  tolerating well with no side effects).   I have spent a total of 30 minutes with patient reviewing chart, EKGs, labs and examining patient as well as establishing an assessment and plan that was discussed with the patient.  > 50% of time was spent in direct patient care.    Medication Adjustments/Labs and Tests Ordered: Current medicines are reviewed at length with the patient today.  Concerns regarding medicines are outlined above.   Tests Ordered: Orders Placed This Encounter  Procedures   EKG 12-Lead   ECHOCARDIOGRAM COMPLETE     Medication Changes: No orders of the defined types were placed in this encounter.   Disposition:  Follow up  1 year  Signed, Kinzly Pierrelouis Verne Spurr, MD, 12/14/2022 12:58 PM    [facility] [address] [place], [place] [address]

## 2022-12-15 ENCOUNTER — Ambulatory Visit (HOSPITAL_COMMUNITY)
Admission: RE | Admit: 2022-12-15 | Discharge: 2022-12-15 | Disposition: A | Discharge status: Home / Self Care | Payer: Medicare Other | Source: Ambulatory Visit | Attending: Internal Medicine | Admitting: Internal Medicine

## 2022-12-15 DIAGNOSIS — I351 Nonrheumatic aortic (valve) insufficiency: Secondary | ICD-10-CM | POA: Diagnosis not present

## 2022-12-15 LAB — ECHOCARDIOGRAM COMPLETE
AR max vel: 1.67 cm2
AV Area VTI: 1.64 cm2
AV Area mean vel: 1.71 cm2
AV Mean grad: 5.7 mmHg
AV Peak grad: 11.5 mmHg
Ao pk vel: 1.69 m/s
Area-P 1/2: 2.45 cm2
S' Lateral: 3 cm

## 2022-12-18 ENCOUNTER — Ambulatory Visit (INDEPENDENT_AMBULATORY_CARE_PROVIDER_SITE_OTHER): Payer: Medicare Other | Admitting: Orthopedic Surgery

## 2022-12-18 ENCOUNTER — Encounter: Payer: Self-pay | Admitting: Orthopedic Surgery

## 2022-12-18 VITALS — BP 136/72 | HR 69 | Ht 72.0 in | Wt 173.4 lb

## 2022-12-18 DIAGNOSIS — M170 Bilateral primary osteoarthritis of knee: Secondary | ICD-10-CM | POA: Diagnosis not present

## 2022-12-18 NOTE — Patient Instructions (Signed)
Instructions Following Joint Injections  In clinic today, you received an injection in one of your joints (sometimes more than one).  Occasionally, you can have some pain at the injection site, this is normal.  You can place ice at the injection site, or take over-the-counter medications such as Tylenol (acetaminophen) or Advil (ibuprofen).  Please follow all directions listed on the bottle.  If your joint (knee or shoulder) becomes swollen, red or very painful, please contact the clinic for additional assistance.   

## 2022-12-18 NOTE — Progress Notes (Signed)
Return Patient Visit  Assessment: Jorge Bishop is a 87 y.o. male with the following: Bilateral knee arthritis  Plan: Jorge Bishop has pain in both knees.  He has decreased joint space within the medial compartment, with some small associated osteophytes.  We discussed the possibility of proceeding with hyaluronic acid injections, and he elected to proceed.  Today is his first dose.  He will return to clinic in 1 week for his second injection.  Procedure note injection Right knee joint   Verbal consent was obtained to inject the right knee joint  Timeout was completed to confirm the site of injection.  The skin was prepped with alcohol and ethyl chloride was sprayed at the injection site.  A 21-gauge needle was used to inject Orthovisc hyaluronic acid into the right knee using an anterolateral approach.  There were no complications. A sterile bandage was applied.  Procedure note injection Left knee joint   Verbal consent was obtained to inject the right knee joint  Timeout was completed to confirm the site of injection.  The skin was prepped with alcohol and ethyl chloride was sprayed at the injection site.  A 21-gauge needle was used to inject Orthovisc hyaluronic acid into the right knee using an anterolateral approach.  There were no complications. A sterile bandage was applied.   This patient is diagnosed with osteoarthritis of the knee(s).    Radiographs show evidence of joint space narrowing, osteophytes, subchondral sclerosis and/or subchondral cysts.  This patient has knee pain which interferes with functional and activities of daily living.    This patient has experienced inadequate response, adverse effects and/or intolerance with conservative treatments such as acetaminophen, NSAIDS, topical creams, physical therapy or regular exercise, knee bracing and/or weight loss.   This patient has experienced inadequate response or has a contraindication to intra articular steroid  injections for at least 3 months.   This patient is not scheduled to have a total knee replacement within 6 months of starting treatment with viscosupplementation.   Follow-up: Return in about 1 week (around 12/25/2022).  Subjective:  No chief complaint on file.   History of Present Illness: Jorge Bishop is a 87 y.o. male who returns to clinic for evaluation of bilateral knee pain.  He continues to have pain in both knees.  It is progressively worsening.  No specific injury.  Pain is primarily in the medial aspect of both knees.  He is here today for his first hyaluronic acid injection.   Review of Systems: No fevers or chills No numbness or tingling No chest pain No shortness of breath No bowel or bladder dysfunction No GI distress No headaches   Objective: BP 136/72   Pulse 69   Ht 6' (1.829 m)   Wt 173 lb 6.4 oz (78.7 kg)   BMI 23.52 kg/m   Physical Exam:  General: Elderly male., Alert and oriented., and No acute distress. Gait: Slow, steady gait.  Bilateral knees without swelling.  Is good range of motion.  No increased laxity to varus or valgus stress.  Tenderness palpation along the medial joint line.  Negative Lachman bilaterally.  IMAGING: No new imaging obtained today     New Medications:  No orders of the defined types were placed in this encounter.     Oliver Barre, MD  12/18/2022 12:04 PM

## 2022-12-23 ENCOUNTER — Other Ambulatory Visit: Payer: Self-pay

## 2022-12-23 ENCOUNTER — Ambulatory Visit (HOSPITAL_COMMUNITY): Payer: Medicare Other | Attending: Orthopedic Surgery | Admitting: Physical Therapy

## 2022-12-23 DIAGNOSIS — M25562 Pain in left knee: Secondary | ICD-10-CM | POA: Insufficient documentation

## 2022-12-23 DIAGNOSIS — G8929 Other chronic pain: Secondary | ICD-10-CM | POA: Diagnosis present

## 2022-12-23 DIAGNOSIS — M6281 Muscle weakness (generalized): Secondary | ICD-10-CM | POA: Diagnosis present

## 2022-12-23 DIAGNOSIS — M25561 Pain in right knee: Secondary | ICD-10-CM | POA: Diagnosis present

## 2022-12-23 DIAGNOSIS — R296 Repeated falls: Secondary | ICD-10-CM | POA: Insufficient documentation

## 2022-12-23 NOTE — Therapy (Signed)
OUTPATIENT PHYSICAL THERAPY LOWER EXTREMITY EVALUATION   Patient Name: Jorge Bishop MRN: [id number] DOB:June 14, 1936, 87 y.o., male Today's Date: 12/23/2022  END OF SESSION:  PT End of Session - 12/23/22 1338     Visit Number 1    Number of Visits 12    Date for PT Re-Evaluation 02/03/23    Authorization Type medicare    Progress Note Due on Visit 10    PT Start Time 1120    PT Stop Time 1200    PT Time Calculation (min) 40 min    Activity Tolerance Patient tolerated treatment well             Past Medical History:  Diagnosis Date   Arteriosclerotic cardiovascular disease (ASCVD)    Coronary artery bypass graft surgery in 12/98; DES to the RCA SVG in 07/2002; restenosis of the saphenous vein graft stent and renal intervention in 2006; 70% first diagonal present; calf in 11/2007-total obstruction of the saphenous vein graft to the first marginal   Benign prostatic hypertrophy    Cerebrovascular disease    60% bilateral internal carotid artery stenosis in 2012   Coronary artery disease    4 stents   Diaphragmatic hernia 01/15/2021   Diverticulosis    DJD (degenerative joint disease)    Erectile dysfunction    GERD (gastroesophageal reflux disease)    History of blood transfusion    History of kidney stones    Hyperlipidemia    Lipid profile in 12/2009:104, 91, 36, 50   Hypertension    Kidney stone 09/08/2017   Migraine headache    Right bundle branch block    Right ureteral stone 03/08/2019   Shortness of breath    Thrombocytopenia (HCC)    platelets of 104 in 03/2009   Upper gastrointestinal hemorrhage 10/09    Helicobacter pylori positive; subsequent laparoscopic fundoplication-2009   Past Surgical History:  Procedure Laterality Date   CARDIAC CATHETERIZATION     4 cardiac stents   CATARACT EXTRACTION W/PHACO Right 07/25/2012   Procedure: CATARACT EXTRACTION PHACO AND INTRAOCULAR LENS PLACEMENT (IOC);  Surgeon: Gemma Payor, MD;  Location: [facility];  Service:  Ophthalmology;  Laterality: Right;  CDE:23.31   CATARACT EXTRACTION W/PHACO Left 08/18/2012   Procedure: CATARACT EXTRACTION PHACO AND INTRAOCULAR LENS PLACEMENT (IOC);  Surgeon: Gemma Payor, MD;  Location: [facility];  Service: Ophthalmology;  Laterality: Left;  CDE: 16.07   COLONOSCOPY N/A 08/01/2014   Procedure: COLONOSCOPY;  Surgeon: Malissa Hippo, MD;  Location: [facility];  Service: Endoscopy;  Laterality: N/A;  125 - moved to 10:30 - Ann to notify pt   CORONARY ARTERY BYPASS GRAFT  1998    CYSTOSCOPY/URETEROSCOPY/HOLMIUM LASER/STENT PLACEMENT Right 11/12/2017   Procedure: CYSTOSCOPY/RIGHT RETROGRADE PYELOGRAM/RIGHT URETEROSCOPY/HOLMIUM LASER LITHOTRIPSY RIGHT URETERAL CALCULUS/RIGHT URETERAL STENT PLACEMENT;  Surgeon: Bjorn Pippin, MD;  Location: [facility];  Service: Urology;  Laterality: Right;  right ureteral calculus given to patient's wife per Dr. Annabell Howells   CYSTOSCOPY/URETEROSCOPY/HOLMIUM LASER/STENT PLACEMENT Right 03/07/2019   Procedure: CYSTOSCOPY RIGHT RETROGRADE RIGHT URETEROSCOPY;  Surgeon: Bjorn Pippin, MD;  Location: [facility];  Service: Urology;  Laterality: Right;   EXTRACORPOREAL SHOCK WAVE LITHOTRIPSY Right 09/13/2017   Procedure: RIGHT EXTRACORPOREAL SHOCK WAVE LITHOTRIPSY (ESWL);  Surgeon: Crista Elliot, MD;  Location: [facility];  Service: Urology;  Laterality: Right;   HERNIA REPAIR     right inguinal   LAPAROSCOPIC NISSEN FUNDOPLICATION  1997, 2009    revision in 2009 along with repair of paraesophagel hernia  PARAESOPHAGEAL HERNIA REPAIR     TRANSURETHRAL RESECTION OF PROSTATE N/A 03/07/2019   Procedure: TRANSURETHRAL RESECTION OF THE PROSTATE (TURP);  Surgeon: Bjorn Pippin, MD;  Location: [facility];  Service: Urology;  Laterality: N/A;   Patient Active Problem List   Diagnosis Date Noted   Aortic regurgitation 12/14/2022   Primary osteoarthritis of both knees 11/18/2022   Right inguinal hernia 08/12/2022   Chronic gout involving toe of right foot without tophus 04/07/2022    Chronic constipation 04/07/2022   Senile purpura (HCC) 09/25/2021   MGUS (monoclonal gammopathy of unknown significance) 09/03/2021   Idiopathic peripheral neuropathy 05/21/2021   History of coronary artery bypass graft 01/15/2021   Asymptomatic carotid artery stenosis 01/15/2021   Bilateral pseudophakia 01/15/2021   Gastroesophageal reflux disease 01/15/2021   Osteopenia 01/15/2021   Right bundle branch block 01/15/2021   Skin cancer 01/15/2021   Spinal stenosis of lumbar region 01/15/2021   Vitamin D deficiency 01/15/2021   Hypothyroidism 01/15/2021   Dizziness 08/13/2020   Neck pain 01/30/2020   Osteoarthritis of spine with radiculopathy, lumbar region 01/30/2020   Benign localized prostatic hyperplasia with lower urinary tract symptoms (LUTS) 03/07/2019   Cervical myelopathy (HCC) 01/16/2019   Protein-calorie malnutrition, severe 09/11/2017   Hyperlipidemia    Cerebrovascular disease    Thrombocytopenia (HCC)    Erectile dysfunction    CAD (coronary artery disease)    Hypertension 01/16/2009    PCP: Trena Platt  REFERRING PROVIDER: Oliver Barre, MD  REFERRING DIAG: Impression: Bilateral mild to moderate knee arthritis    THERAPY DIAG:  Muscle weakness (generalized)  Chronic pain of right knee  Chronic pain of left knee  Rationale for Evaluation and Treatment: Rehabilitation  ONSET DATE: chronic but noted increased pain in the past 6 months    SUBJECTIVE STATEMENT: PT states that he has had pain in his knees for years but in the past 6 months he has noted significant increased pain.  He states at his age he does not want a TKR.  He is having injections and has had the first of three.  He is most concerned that he is unable to get up off the floor when falls.  He has no rhyme or reason of falling.    PERTINENT HISTORY: Vertigo for 30 years, OA, Torn RTC in LT shoulder, back surgery  PAIN:  Are you having pain? Yes: Pain location: knee Pain description:  aches Aggravating factors: activity Relieving factors: not sure but the injection seems to be helping  Pain varies when WB from a 5-8 PRECAUTIONS: Fall  WEIGHT BEARING RESTRICTIONS: No  FALLS:  Has patient fallen in last 6 months? Yes. Number of falls 3  LIVING ENVIRONMENT: Lives with: lives with their family and lives with their spouse Lives in: House/apartment Stairs: Yes: Internal: 10 steps; on right going up downstairs but does not need to go down  Has following equipment at home: Single point cane  OCCUPATION: retiered  PLOF: Independent  PATIENT GOALS: to be able to get up off the floor   NEXT MD VISIT: 12/29/2022  OBJECTIVE:   DIAGNOSTIC FINDINGS: Impression: Bilateral mild to moderate knee arthritis    PATIENT SURVEYS:  FOTO 57  COGNITION: Overall cognitive status: Within functional limits for tasks assessed       LOWER EXTREMITY ROM:WFL  LOWER EXTREMITY MMT:  MMT Right eval Left eval  Hip flexion 4+ 5  Hip extension 2 2+  Hip abduction 3+ 4  Hip adduction    Hip  internal rotation    Hip external rotation    Knee flexion 3 3+  Knee extension 4+/5 5  Ankle dorsiflexion 5 5  Ankle plantarflexion    Ankle inversion    Ankle eversion     (Blank rows = not tested) FUNCTIONAL TESTS:  30 seconds chair stand test:  unable without UE assist :  with UE assist 9 x ; 7 x is poor 11 is average  2 minute walk test: 390 Ft  Single leg stance:  RT: 2"    , Lt:1"   TODAY'S TREATMENT:                                                                                                                              DATE: 12/23/22 evaluation  12/23/22 Sit to stand x 10 Mini squat x 10  PATIENT EDUCATION:  Education details: HEP Person educated: Patient Education method: Explanation Education comprehension: verbalized understanding  HOME EXERCISE PROGRAM: Access Code: [email or web address] URL: [email or web address] Date: 12/23/2022 Prepared by: Virgina Organ  Exercises - Supine Bridge  - 2 x daily - 7 x weekly - 1 sets - 10 reps - 5" hold - Mini Squat with Counter Support  - 2 x daily - 7 x weekly - 1 sets - 10 reps - 5" hold - Sit to Stand with Counter Support  - 2 x daily - 7 x weekly - 1 sets - 10 reps  ASSESSMENT:  CLINICAL IMPRESSION: Patient is a 87 y.o. male  who was seen today for physical therapy evaluation and treatment for B knee pain.  Evaluation demonstrates decreased strength, decreased balance, and increased pain.  The pt walks several miles with his wife each day but at times has to place a hand on her shoulder for balance, he is also unable to get off the floor by himself and would like to be able to do this as due to his balance and neuropathy he does fall.  Mr. Luallen will benefit from skilled PT to address these issues and get the pt to a point where he can get himself off of the floor.   OBJECTIVE IMPAIRMENTS: Abnormal gait, decreased strength, and pain.   ACTIVITY LIMITATIONS: carrying, lifting, squatting, and stairs  PARTICIPATION LIMITATIONS: shopping and community activity  PERSONAL FACTORS: 1-2 comorbidities: back surgery, peripheral neuropathy  are also affecting patient's functional outcome.   REHAB POTENTIAL: Good  CLINICAL DECISION MAKING: Evolving/moderate complexity  EVALUATION COMPLEXITY: Moderate   GOALS: Goals reviewed with patient? No  SHORT TERM GOALS: Target date: 01/13/23 Pt to be I in HEP in order to increase his strength 1/2 grade to improve knee stability and decrease pain to no more than 6/10  Baseline: Goal status: INITIAL  2.  PT to be able to single leg stance on his LE for 10" to reduce risk of falls  Baseline:  Goal status: INITIAL   LONG TERM GOALS: Target date: 02/03/23  Pt to be I  in an advanced HEP in order to allow pt to be able to I get himself off the floor  Baseline:  Goal status: INITIAL  2.  Pt to be able to sls for 20" in order to reduce risk of falls.   Baseline:  Goal status: INITIAL  3.  PT pain to be no greater than a 4/10 Baseline:  Goal status: INITIAL    PLAN:  PT FREQUENCY: 2x/week  PT DURATION: 6 weeks  PLANNED INTERVENTIONS: Therapeutic exercises, Therapeutic activity, Neuromuscular re-education, Balance training, Gait training, Patient/Family education, Self Care, Joint mobilization, and Manual therapy  PLAN FOR NEXT SESSION: begin tandem stance, cybex hamstring, body craft hip extension, sit to stand, squats, progress as able.  Virgina Organ, PT CLT [phone]  12/23/2022, 1:40 PM

## 2022-12-25 ENCOUNTER — Ambulatory Visit (INDEPENDENT_AMBULATORY_CARE_PROVIDER_SITE_OTHER): Payer: Medicare Other | Admitting: Orthopedic Surgery

## 2022-12-25 ENCOUNTER — Encounter: Payer: Self-pay | Admitting: Orthopedic Surgery

## 2022-12-25 DIAGNOSIS — M170 Bilateral primary osteoarthritis of knee: Secondary | ICD-10-CM | POA: Diagnosis not present

## 2022-12-25 NOTE — Progress Notes (Signed)
Return Patient Visit  Assessment: Jorge Bishop is a 87 y.o. male with the following: Bilateral knee arthritis  Plan: [name] has arthritis in both knees.  He has noted improvement in the left knee since his first HA injection.  Right knee is still more painful.  He is ready to proceed with 2nd injection.   Procedure note injection Right knee joint   Verbal consent was obtained to inject the right knee joint  Timeout was completed to confirm the site of injection.  The skin was prepped with alcohol and ethyl chloride was sprayed at the injection site.  A 21-gauge needle was used to inject Orthovisc hyaluronic acid into the right knee using an anterolateral approach.  There were no complications. A sterile bandage was applied.  Procedure note injection Left knee joint   Verbal consent was obtained to inject the right knee joint  Timeout was completed to confirm the site of injection.  The skin was prepped with alcohol and ethyl chloride was sprayed at the injection site.  A 21-gauge needle was used to inject Orthovisc hyaluronic acid into the right knee using an anterolateral approach.  There were no complications. A sterile bandage was applied.   This patient is diagnosed with osteoarthritis of the knee(s).    Radiographs show evidence of joint space narrowing, osteophytes, subchondral sclerosis and/or subchondral cysts.  This patient has knee pain which interferes with functional and activities of daily living.    This patient has experienced inadequate response, adverse effects and/or intolerance with conservative treatments such as acetaminophen, NSAIDS, topical creams, physical therapy or regular exercise, knee bracing and/or weight loss.   This patient has experienced inadequate response or has a contraindication to intra articular steroid injections for at least 3 months.   This patient is not scheduled to have a total knee replacement within 6 months of starting treatment  with viscosupplementation.   Follow-up: Return in about 1 week (around 01/01/2023).  Subjective:  Chief Complaint  Patient presents with   Injections    OrthoVisc [name] NRC: [id number] Lot: [id number] Exp  11/18/24    History of Present Illness: Jorge Bishop is a 87 y.o. male who returns to clinic for evaluation of bilateral knee pain.  HA injection completed a week ago.  Left knee feels better.  Right knee still feels better.    Review of Systems: No fevers or chills No numbness or tingling No chest pain No shortness of breath No bowel or bladder dysfunction No GI distress No headaches   Objective: There were no vitals taken for this visit.  Physical Exam:  General: Elderly male., Alert and oriented., and No acute distress. Gait: Slow, steady gait.  Bilateral knees without swelling.  Is good range of motion.  No increased laxity to varus or valgus stress.  Tenderness palpation along the medial joint line.  Negative Lachman bilaterally.  IMAGING: No new imaging obtained today     New Medications:  No orders of the defined types were placed in this encounter.     Oliver Barre, MD  12/25/2022 10:07 PM

## 2022-12-25 NOTE — Patient Instructions (Signed)
Instructions Following Joint Injections  In clinic today, you received an injection in one of your joints (sometimes more than one).  Occasionally, you can have some pain at the injection site, this is normal.  You can place ice at the injection site, or take over-the-counter medications such as Tylenol (acetaminophen) or Advil (ibuprofen).  Please follow all directions listed on the bottle.  If your joint (knee or shoulder) becomes swollen, red or very painful, please contact the clinic for additional assistance.   

## 2022-12-29 ENCOUNTER — Other Ambulatory Visit: Payer: Self-pay

## 2022-12-29 DIAGNOSIS — G609 Hereditary and idiopathic neuropathy, unspecified: Secondary | ICD-10-CM

## 2022-12-29 MED ORDER — DULOXETINE HCL 30 MG PO CPEP
30.0000 mg | ORAL_CAPSULE | Freq: Every day | ORAL | 3 refills | Status: DC
Start: 2022-12-29 — End: 2023-03-22

## 2022-12-30 ENCOUNTER — Ambulatory Visit (HOSPITAL_COMMUNITY): Payer: Medicare Other | Admitting: Physical Therapy

## 2022-12-30 DIAGNOSIS — G8929 Other chronic pain: Secondary | ICD-10-CM

## 2022-12-30 DIAGNOSIS — M6281 Muscle weakness (generalized): Secondary | ICD-10-CM

## 2022-12-30 NOTE — Therapy (Signed)
OUTPATIENT PHYSICAL THERAPY LOWER EXTREMITY Treatment    Patient Name: Jorge Bishop MRN: [id number] DOB:[birth year], 87 y.o., male Today's Date: 12/30/2022  END OF SESSION:  PT End of Session - 12/30/22 1340    Visit Number 2    Number of Visits 12    Date for PT Re-Evaluation 02/03/23    Authorization Type medicare    Progress Note Due on Visit 10    PT Start Time 1300    PT Stop Time 1340    PT Time Calculation (min) 40 min    Activity Tolerance Patient tolerated treatment well             Past Medical History:  Diagnosis Date   Arteriosclerotic cardiovascular disease (ASCVD)    Coronary artery bypass graft surgery in 12/98; DES to the RCA SVG in 07/2002; restenosis of the saphenous vein graft stent and renal intervention in 2006; 70% first diagonal present; calf in 11/2007-total obstruction of the saphenous vein graft to the first marginal   Benign prostatic hypertrophy    Cerebrovascular disease    60% bilateral internal carotid artery stenosis in 2012   Coronary artery disease    4 stents   Diaphragmatic hernia 01/15/2021   Diverticulosis    DJD (degenerative joint disease)    Erectile dysfunction    GERD (gastroesophageal reflux disease)    History of blood transfusion    History of kidney stones    Hyperlipidemia    Lipid profile in 12/2009:104, 91, 36, 50   Hypertension    Kidney stone 09/08/2017   Migraine headache    Right bundle branch block    Right ureteral stone 03/08/2019   Shortness of breath    Thrombocytopenia (HCC)    platelets of 104 in 03/2009   Upper gastrointestinal hemorrhage 10/09    Helicobacter pylori positive; subsequent laparoscopic fundoplication-2009   Past Surgical History:  Procedure Laterality Date   CARDIAC CATHETERIZATION     4 cardiac stents   CATARACT EXTRACTION W/PHACO Right 07/25/2012   Procedure: CATARACT EXTRACTION PHACO AND INTRAOCULAR LENS PLACEMENT (IOC);  Surgeon: Gemma Payor, MD;  Location: [facility];  Service:  Ophthalmology;  Laterality: Right;  CDE:23.31   CATARACT EXTRACTION W/PHACO Left 08/18/2012   Procedure: CATARACT EXTRACTION PHACO AND INTRAOCULAR LENS PLACEMENT (IOC);  Surgeon: Gemma Payor, MD;  Location: [facility];  Service: Ophthalmology;  Laterality: Left;  CDE: 16.07   COLONOSCOPY N/A 08/01/2014   Procedure: COLONOSCOPY;  Surgeon: Malissa Hippo, MD;  Location: [facility];  Service: Endoscopy;  Laterality: N/A;  125 - moved to 10:30 - Ann to notify pt   CORONARY ARTERY BYPASS GRAFT  1998    CYSTOSCOPY/URETEROSCOPY/HOLMIUM LASER/STENT PLACEMENT Right 11/12/2017   Procedure: CYSTOSCOPY/RIGHT RETROGRADE PYELOGRAM/RIGHT URETEROSCOPY/HOLMIUM LASER LITHOTRIPSY RIGHT URETERAL CALCULUS/RIGHT URETERAL STENT PLACEMENT;  Surgeon: Bjorn Pippin, MD;  Location: [facility];  Service: Urology;  Laterality: Right;  right ureteral calculus given to patient's wife per Dr. Annabell Howells   CYSTOSCOPY/URETEROSCOPY/HOLMIUM LASER/STENT PLACEMENT Right 03/07/2019   Procedure: CYSTOSCOPY RIGHT RETROGRADE RIGHT URETEROSCOPY;  Surgeon: Bjorn Pippin, MD;  Location: [facility];  Service: Urology;  Laterality: Right;   EXTRACORPOREAL SHOCK WAVE LITHOTRIPSY Right 09/13/2017   Procedure: RIGHT EXTRACORPOREAL SHOCK WAVE LITHOTRIPSY (ESWL);  Surgeon: Crista Elliot, MD;  Location: [facility];  Service: Urology;  Laterality: Right;   HERNIA REPAIR     right inguinal   LAPAROSCOPIC NISSEN FUNDOPLICATION  1997, 2009    revision in 2009 along with repair of paraesophagel hernia  PARAESOPHAGEAL HERNIA REPAIR     TRANSURETHRAL RESECTION OF PROSTATE N/A 03/07/2019   Procedure: TRANSURETHRAL RESECTION OF THE PROSTATE (TURP);  Surgeon: Bjorn Pippin, MD;  Location: [facility];  Service: Urology;  Laterality: N/A;   Patient Active Problem List   Diagnosis Date Noted   Aortic regurgitation 12/14/2022   Primary osteoarthritis of both knees 11/18/2022   Right inguinal hernia 08/12/2022   Chronic gout involving toe of right foot without tophus 04/07/2022    Chronic constipation 04/07/2022   Senile purpura (HCC) 09/25/2021   MGUS (monoclonal gammopathy of unknown significance) 09/03/2021   Idiopathic peripheral neuropathy 05/21/2021   History of coronary artery bypass graft 01/15/2021   Asymptomatic carotid artery stenosis 01/15/2021   Bilateral pseudophakia 01/15/2021   Gastroesophageal reflux disease 01/15/2021   Osteopenia 01/15/2021   Right bundle branch block 01/15/2021   Skin cancer 01/15/2021   Spinal stenosis of lumbar region 01/15/2021   Vitamin D deficiency 01/15/2021   Hypothyroidism 01/15/2021   Dizziness 08/13/2020   Neck pain 01/30/2020   Osteoarthritis of spine with radiculopathy, lumbar region 01/30/2020   Benign localized prostatic hyperplasia with lower urinary tract symptoms (LUTS) 03/07/2019   Cervical myelopathy (HCC) 01/16/2019   Protein-calorie malnutrition, severe 09/11/2017   Hyperlipidemia    Cerebrovascular disease    Thrombocytopenia (HCC)    Erectile dysfunction    CAD (coronary artery disease)    Hypertension 01/16/2009    PCP: Trena Platt  REFERRING PROVIDER: Oliver Barre, MD  REFERRING DIAG: Impression: Bilateral mild to moderate knee arthritis    THERAPY DIAG:  Muscle weakness (generalized)  Chronic pain of right knee  Rationale for Evaluation and Treatment: Rehabilitation  ONSET DATE: chronic but noted increased pain in the past 6 months    SUBJECTIVE STATEMENT:  Pt states that he has been completing his exercises.  He has also received his second of 3 injections in his knee.    Eval:  PT states that he has had pain in his knees for years but in the past 6 months he has noted significant increased pain.  He states at his age he does not want a TKR.  He is having injections and has had the first of three.  He is most concerned that he is unable to get up off the floor when falls.  He has no rhyme or reason of falling.    PERTINENT HISTORY: Vertigo for 30 years, OA, Torn RTC in LT  shoulder, back surgery  PAIN:  Are you having pain? Yes: Pain location: knee Pain description: aches Aggravating factors: activity Relieving factors: not sure but the injection seems to be helping  Pain varies when WB from a 5-8 PRECAUTIONS: Fall  WEIGHT BEARING RESTRICTIONS: No  FALLS:  Has patient fallen in last 6 months? Yes. Number of falls 3  LIVING ENVIRONMENT: Lives with: lives with their family and lives with their spouse Lives in: House/apartment Stairs: Yes: Internal: 10 steps; on right going up downstairs but does not need to go down  Has following equipment at home: Single point cane  OCCUPATION: retiered  PLOF: Independent  PATIENT GOALS: to be able to get up off the floor   NEXT MD VISIT: 12/29/2022  OBJECTIVE:   DIAGNOSTIC FINDINGS: Impression: Bilateral mild to moderate knee arthritis    PATIENT SURVEYS:  FOTO 57  COGNITION: Overall cognitive status: Within functional limits for tasks assessed       LOWER EXTREMITY ROM:WFL  LOWER EXTREMITY MMT:  MMT Right eval Left eval  Hip flexion 4+ 5  Hip extension 2 2+  Hip abduction 3+ 4  Hip adduction    Hip internal rotation    Hip external rotation    Knee flexion 3 3+  Knee extension 4+/5 5  Ankle dorsiflexion 5 5  Ankle plantarflexion    Ankle inversion    Ankle eversion     (Blank rows = not tested) FUNCTIONAL TESTS:  30 seconds chair stand test:  unable without UE assist :  with UE assist 9 x ; 7 x is poor 11 is average  2 minute walk test: 390 Ft  Single leg stance:  RT: 2"    , Lt:1"   TODAY'S TREATMENT:                                                                                                                              DATE: 12/30/22 Heel raise x 10  Tandem stance 5x with Rt in front; 5 with LT in front  Rocker board x 2 minutes  Step up 4" step Rt x 10 , Lt x 10  Mini squat x 15 Marching x 10  Sit to stand x 10 Bodycraft  4 Pl leg press x 10  Cybex knee flexion 3Pl  x  10  Nustep level 2, hills  x 5 minutes    12/23/22 evaluation  12/23/22 Sit to stand x 10 Mini squat x 10  PATIENT EDUCATION:  Education details: HEP Person educated: Patient Education method: Explanation Education comprehension: verbalized understanding  HOME EXERCISE PROGRAM: Access Code: [email or web address] URL: [email or web address] Date: 12/23/2022 Prepared by: Virgina Organ  Exercises - Supine Bridge  - 2 x daily - 7 x weekly - 1 sets - 10 reps - 5" hold - Mini Squat with Counter Support  - 2 x daily - 7 x weekly - 1 sets - 10 reps - 5" hold - Sit to Stand with Counter Support  - 2 x daily - 7 x weekly - 1 sets - 10 reps 12/30/22 - Tandem Stance with Support  - 2 x daily - 7 x weekly - 1 sets - 5 reps - 15" hold - Heel Raises with Counter Support  - 2 x daily - 7 x weekly - 1 sets - 10 reps - 5" hold - Supine Hamstring Stretch  - 2 x daily - 7 x weekly - 1 sets - 3 reps - 30" hold ASSESSMENT:  CLINICAL IMPRESSION: Evaluation and goals reviewed with pt.  Therapist updated pt HEP and progressed strengthening and balance exercises.  Pt tends to lean his upper body back when completing balance exercises and needs to be spotted for safety.  PT continues to have weakness and decrease balance which is affecting his functioning level; pt will continue to  benefit from skilled PT at this time.    Patient is a 87 y.o. male  who was seen today for physical therapy evaluation and treatment for B knee pain.  Evaluation demonstrates decreased strength, decreased balance, and increased pain.  The pt walks several miles with his wife each day but at times has to place a hand on her shoulder for balance, he is also unable to get off the floor by himself and would like to be able to do this as due to his balance and neuropathy he does fall.  Mr. Peasley will benefit from skilled PT to address these issues and get the pt to a point where he can get himself off of the floor.   OBJECTIVE  IMPAIRMENTS: Abnormal gait, decreased strength, and pain.   ACTIVITY LIMITATIONS: carrying, lifting, squatting, and stairs  PARTICIPATION LIMITATIONS: shopping and community activity  PERSONAL FACTORS: 1-2 comorbidities: back surgery, peripheral neuropathy  are also affecting patient's functional outcome.   REHAB POTENTIAL: Good  CLINICAL DECISION MAKING: Evolving/moderate complexity  EVALUATION COMPLEXITY: Moderate   GOALS: Goals reviewed with patient? No  SHORT TERM GOALS: Target date: 01/13/23 Pt to be I in HEP in order to increase his strength 1/2 grade to improve knee stability and decrease pain to no more than 6/10  Baseline: Goal status: on-going   2.  PT to be able to single leg stance on his LE for 10" to reduce risk of falls  Baseline:  Goal status: on-going   LONG TERM GOALS: Target date: 02/03/23  Pt to be I in an advanced HEP in order to allow pt to be able to I get himself off the floor  Baseline:  Goal status: on-going  2.  Pt to be able to sls for 20" in order to reduce risk of falls.  Baseline:  Goal status: on-going  3.  PT pain to be no greater than a 4/10 Baseline:  Goal status: on-going    PLAN:  PT FREQUENCY: 2x/week  PT DURATION: 6 weeks  PLANNED INTERVENTIONS: Therapeutic exercises, Therapeutic activity, Neuromuscular re-education, Balance training, Gait training, Patient/Family education, Self Care, Joint mobilization, and Manual therapy  PLAN FOR NEXT SESSION: begin tandem stance, cybex hamstring, body craft hip extension, sit to stand, squats, progress as able.  Virgina Organ, PT CLT [phone]  12/30/2022, 1:41 PM

## 2023-01-01 ENCOUNTER — Encounter: Payer: Self-pay | Admitting: Orthopedic Surgery

## 2023-01-01 ENCOUNTER — Ambulatory Visit (INDEPENDENT_AMBULATORY_CARE_PROVIDER_SITE_OTHER): Payer: Medicare Other | Admitting: Orthopedic Surgery

## 2023-01-01 DIAGNOSIS — M170 Bilateral primary osteoarthritis of knee: Secondary | ICD-10-CM | POA: Diagnosis not present

## 2023-01-01 NOTE — Patient Instructions (Signed)
Instructions Following Joint Injections  In clinic today, you received an injection in one of your joints (sometimes more than one).  Occasionally, you can have some pain at the injection site, this is normal.  You can place ice at the injection site, or take over-the-counter medications such as Tylenol (acetaminophen) or Advil (ibuprofen).  Please follow all directions listed on the bottle.  If your joint (knee or shoulder) becomes swollen, red or very painful, please contact the clinic for additional assistance.   

## 2023-01-01 NOTE — Progress Notes (Signed)
Return Patient Visit  Assessment: Jorge Bishop is a 87 y.o. male with the following: Bilateral knee arthritis  Plan: Jorge Bishop has arthritis in both knees.  He has noted improvement in both knees since his first HA injection.  He is ready to proceed with 3rd and final injection.   Procedure note injection Right knee joint   Verbal consent was obtained to inject the right knee joint  Timeout was completed to confirm the site of injection.  The skin was prepped with alcohol and ethyl chloride was sprayed at the injection site.  A 21-gauge needle was used to inject Orthovisc hyaluronic acid into the right knee using an anterolateral approach.  There were no complications. A sterile bandage was applied.  Procedure note injection Left knee joint   Verbal consent was obtained to inject the right knee joint  Timeout was completed to confirm the site of injection.  The skin was prepped with alcohol and ethyl chloride was sprayed at the injection site.  A 21-gauge needle was used to inject Orthovisc hyaluronic acid into the right knee using an anterolateral approach.  There were no complications. A sterile bandage was applied.   This patient is diagnosed with osteoarthritis of the knee(s).    Radiographs show evidence of joint space narrowing, osteophytes, subchondral sclerosis and/or subchondral cysts.  This patient has knee pain which interferes with functional and activities of daily living.    This patient has experienced inadequate response, adverse effects and/or intolerance with conservative treatments such as acetaminophen, NSAIDS, topical creams, physical therapy or regular exercise, knee bracing and/or weight loss.   This patient has experienced inadequate response or has a contraindication to intra articular steroid injections for at least 3 months.   This patient is not scheduled to have a total knee replacement within 6 months of starting treatment with  viscosupplementation.   Follow-up: Return if symptoms worsen or fail to improve.  Subjective:  Chief Complaint  Patient presents with   Injections    Bilat knees OrthoVisc #3  WNU:[id number] Lot: [id number] Exp 11/18/24    History of Present Illness: Jorge Bishop is a 87 y.o. male who returns to clinic for evaluation of bilateral knee pain.  Two doses of HA have been completed.  He is doing better, rates improvement at 8/10.  He is still working with PT.   Review of Systems: No fevers or chills No numbness or tingling No chest pain No shortness of breath No bowel or bladder dysfunction No GI distress No headaches   Objective: There were no vitals taken for this visit.  Physical Exam:  General: Elderly male., Alert and oriented., and No acute distress. Gait: Slow, steady gait.  Bilateral knees without swelling.  He has good range of motion.  No increased laxity to varus or valgus stress.  Tenderness palpation along the medial joint line.  Negative Lachman bilaterally.  IMAGING: No new imaging obtained today     New Medications:  No orders of the defined types were placed in this encounter.     Oliver Barre, MD  01/01/2023 11:44 AM

## 2023-01-05 ENCOUNTER — Ambulatory Visit (HOSPITAL_COMMUNITY): Payer: Medicare Other | Admitting: Physical Therapy

## 2023-01-05 ENCOUNTER — Encounter (HOSPITAL_COMMUNITY): Payer: Self-pay | Admitting: Physical Therapy

## 2023-01-05 DIAGNOSIS — M6281 Muscle weakness (generalized): Secondary | ICD-10-CM | POA: Diagnosis not present

## 2023-01-05 DIAGNOSIS — G8929 Other chronic pain: Secondary | ICD-10-CM

## 2023-01-05 NOTE — Therapy (Signed)
OUTPATIENT PHYSICAL THERAPY LOWER EXTREMITY Treatment    Patient Name: Jorge Bishop MRN: [id number] DOB:May 19, 1936, 87 y.o., male Today's Date: 01/05/2023  END OF SESSION:   PT End of Session - 01/05/23 1433     Visit Number 3    Number of Visits 12    Date for PT Re-Evaluation 02/03/23    Authorization Type medicare    Progress Note Due on Visit 10    PT Start Time 1433    PT Stop Time 1512    PT Time Calculation (min) 39 min    Activity Tolerance Patient tolerated treatment well               Past Medical History:  Diagnosis Date   Arteriosclerotic cardiovascular disease (ASCVD)    Coronary artery bypass graft surgery in 12/98; DES to the RCA SVG in 07/2002; restenosis of the saphenous vein graft stent and renal intervention in 2006; 70% first diagonal present; calf in 11/2007-total obstruction of the saphenous vein graft to the first marginal   Benign prostatic hypertrophy    Cerebrovascular disease    60% bilateral internal carotid artery stenosis in 2012   Coronary artery disease    4 stents   Diaphragmatic hernia 01/15/2021   Diverticulosis    DJD (degenerative joint disease)    Erectile dysfunction    GERD (gastroesophageal reflux disease)    History of blood transfusion    History of kidney stones    Hyperlipidemia    Lipid profile in 12/2009:104, 91, 36, 50   Hypertension    Kidney stone 09/08/2017   Migraine headache    Right bundle branch block    Right ureteral stone 03/08/2019   Shortness of breath    Thrombocytopenia (HCC)    platelets of 104 in 03/2009   Upper gastrointestinal hemorrhage 10/09    Helicobacter pylori positive; subsequent laparoscopic fundoplication-2009   Past Surgical History:  Procedure Laterality Date   CARDIAC CATHETERIZATION     4 cardiac stents   CATARACT EXTRACTION W/PHACO Right 07/25/2012   Procedure: CATARACT EXTRACTION PHACO AND INTRAOCULAR LENS PLACEMENT (IOC);  Surgeon: Gemma Payor, MD;  Location: [facility];  Service:  Ophthalmology;  Laterality: Right;  CDE:23.31   CATARACT EXTRACTION W/PHACO Left 08/18/2012   Procedure: CATARACT EXTRACTION PHACO AND INTRAOCULAR LENS PLACEMENT (IOC);  Surgeon: Gemma Payor, MD;  Location: [facility];  Service: Ophthalmology;  Laterality: Left;  CDE: 16.07   COLONOSCOPY N/A 08/01/2014   Procedure: COLONOSCOPY;  Surgeon: Malissa Hippo, MD;  [facility]: [facility];  Service: Endoscopy;  Laterality: N/A;  125 - moved to 10:30 - Ann to notify pt   CORONARY ARTERY BYPASS GRAFT  1998    CYSTOSCOPY/URETEROSCOPY/HOLMIUM LASER/STENT PLACEMENT Right 11/12/2017   Procedure: CYSTOSCOPY/RIGHT RETROGRADE PYELOGRAM/RIGHT URETEROSCOPY/HOLMIUM LASER LITHOTRIPSY RIGHT URETERAL CALCULUS/RIGHT URETERAL STENT PLACEMENT;  Surgeon: Bjorn Pippin, MD;  Location: [facility];  Service: Urology;  Laterality: Right;  right ureteral calculus given to patient's wife per Dr. Annabell Howells   CYSTOSCOPY/URETEROSCOPY/HOLMIUM LASER/STENT PLACEMENT Right 03/07/2019   Procedure: CYSTOSCOPY RIGHT RETROGRADE RIGHT URETEROSCOPY;  Surgeon: Bjorn Pippin, MD;  Location: [facility];  Service: Urology;  Laterality: Right;   EXTRACORPOREAL SHOCK WAVE LITHOTRIPSY Right 09/13/2017   Procedure: RIGHT EXTRACORPOREAL SHOCK WAVE LITHOTRIPSY (ESWL);  Surgeon: Crista Elliot, MD;  Location: [facility];  Service: Urology;  Laterality: Right;   HERNIA REPAIR     right inguinal   LAPAROSCOPIC NISSEN FUNDOPLICATION  1997, 2009    revision in 2009 along with repair  of paraesophagel hernia    PARAESOPHAGEAL HERNIA REPAIR     TRANSURETHRAL RESECTION OF PROSTATE N/A 03/07/2019   Procedure: TRANSURETHRAL RESECTION OF THE PROSTATE (TURP);  Surgeon: Bjorn Pippin, MD;  Location: [facility];  Service: Urology;  Laterality: N/A;   Patient Active Problem List   Diagnosis Date Noted   Aortic regurgitation 12/14/2022   Primary osteoarthritis of both knees 11/18/2022   Right inguinal hernia 08/12/2022   Chronic gout involving toe of right foot without tophus 04/07/2022    Chronic constipation 04/07/2022   Senile purpura (HCC) 09/25/2021   MGUS (monoclonal gammopathy of unknown significance) 09/03/2021   Idiopathic peripheral neuropathy 05/21/2021   History of coronary artery bypass graft 01/15/2021   Asymptomatic carotid artery stenosis 01/15/2021   Bilateral pseudophakia 01/15/2021   Gastroesophageal reflux disease 01/15/2021   Osteopenia 01/15/2021   Right bundle branch block 01/15/2021   Skin cancer 01/15/2021   Spinal stenosis of lumbar region 01/15/2021   Vitamin D deficiency 01/15/2021   Hypothyroidism 01/15/2021   Dizziness 08/13/2020   Neck pain 01/30/2020   Osteoarthritis of spine with radiculopathy, lumbar region 01/30/2020   Benign localized prostatic hyperplasia with lower urinary tract symptoms (LUTS) 03/07/2019   Cervical myelopathy (HCC) 01/16/2019   Protein-calorie malnutrition, severe 09/11/2017   Hyperlipidemia    Cerebrovascular disease    Thrombocytopenia (HCC)    Erectile dysfunction    CAD (coronary artery disease)    Hypertension 01/16/2009    PCP: Trena Platt  REFERRING PROVIDER: Oliver Barre, MD  REFERRING DIAG: Impression: Bilateral mild to moderate knee arthritis    THERAPY DIAG:  Muscle weakness (generalized)  Chronic pain of right knee  Rationale for Evaluation and Treatment: Rehabilitation  ONSET DATE: chronic but noted increased pain in the past 6 months    SUBJECTIVE STATEMENT:  Pt states that he has been completing his exercises.  He has also received his second of 3 injections in his knee.    Eval:  PT states that he has had pain in his knees for years but in the past 6 months he has noted significant increased pain.  He states at his age he does not want a TKR.  He is having injections and has had the first of three.  He is most concerned that he is unable to get up off the floor when falls.  He has no rhyme or reason of falling.    PERTINENT HISTORY: Vertigo for 30 years, OA, Torn RTC in LT  shoulder, back surgery  PAIN:  Are you having pain? Yes: Pain location: knee Pain description: aches Aggravating factors: activity Relieving factors: not sure but the injection seems to be helping  Pain varies when WB from a 5-8 PRECAUTIONS: Fall  WEIGHT BEARING RESTRICTIONS: No  FALLS:  Has patient fallen in last 6 months? Yes. Number of falls 3  LIVING ENVIRONMENT: Lives with: lives with their family and lives with their spouse Lives in: House/apartment Stairs: Yes: Internal: 10 steps; on right going up downstairs but does not need to go down  Has following equipment at home: Single point cane  OCCUPATION: retiered  PLOF: Independent  PATIENT GOALS: to be able to get up off the floor   NEXT MD VISIT: 12/29/2022  OBJECTIVE:   DIAGNOSTIC FINDINGS: Impression: Bilateral mild to moderate knee arthritis    PATIENT SURVEYS:  FOTO 57  COGNITION: Overall cognitive status: Within functional limits for tasks assessed       LOWER EXTREMITY ROM:WFL  LOWER EXTREMITY MMT:  MMT Right eval Left eval  Hip flexion 4+ 5  Hip extension 2 2+  Hip abduction 3+ 4  Hip adduction    Hip internal rotation    Hip external rotation    Knee flexion 3 3+  Knee extension 4+/5 5  Ankle dorsiflexion 5 5  Ankle plantarflexion    Ankle inversion    Ankle eversion     (Blank rows = not tested) FUNCTIONAL TESTS:  30 seconds chair stand test:  unable without UE assist :  with UE assist 9 x ; 7 x is poor 11 is average  2 minute walk test: 390 Ft  Single leg stance:  RT: 2"    , Lt:1"   TODAY'S TREATMENT:                                                                                                                              DATE: 01/05/23 HR 1 x 20 Mini squat x 20 Standing hip abduction RTB at calves 2 x 10 bilateral Standing hip extension RTB at calves 2 x 10 bilateral Step up 6 inch 2 x 10 bilateral  SLS with vectors 5 x 5 second holds bilateral  Lunge 1 x 10 bilateral   Bodycraft  4 Pl leg press x 15  12/30/22 Heel raise x 10  Tandem stance 5x with Rt in front; 5 with LT in front  Rocker board x 2 minutes  Step up 4" step Rt x 10 , Lt x 10  Mini squat x 15 Marching x 10  Sit to stand x 10 Bodycraft  4 Pl leg press x 10  Cybex knee flexion 3Pl  x 10  Nustep level 2, hills  x 5 minutes    12/23/22 evaluation  12/23/22 Sit to stand x 10 Mini squat x 10  PATIENT EDUCATION:  Education details: HEP Person educated: Patient Education method: Explanation Education comprehension: verbalized understanding  HOME EXERCISE PROGRAM: Access Code: [email or web address] URL: [email or web address] Date: 12/23/2022 Prepared by: Virgina Organ  Exercises - Supine Bridge  - 2 x daily - 7 x weekly - 1 sets - 10 reps - 5" hold - Mini Squat with Counter Support  - 2 x daily - 7 x weekly - 1 sets - 10 reps - 5" hold - Sit to Stand with Counter Support  - 2 x daily - 7 x weekly - 1 sets - 10 reps 12/30/22 - Tandem Stance with Support  - 2 x daily - 7 x weekly - 1 sets - 5 reps - 15" hold - Heel Raises with Counter Support  - 2 x daily - 7 x weekly - 1 sets - 10 reps - 5" hold - Supine Hamstring Stretch  - 2 x daily - 7 x weekly - 1 sets - 3 reps - 30" hold  7/23  - Standing Hip Abduction with Resistance at Ankles and Counter Support  - 1 x daily - 7 x weekly -  2 sets - 10 reps - Standing Hip Extension with Resistance at Ankles and Counter Support  - 1 x daily - 7 x weekly - 2 sets - 10 reps  ASSESSMENT:  CLINICAL IMPRESSION: Patient able to complete additional reps of previously completed exercises. Tolerates additional step height and resistance to glute strengthening. Patient will continue to benefit from physical therapy in order to improve function and reduce impairment.    Patient is a 87 y.o. male  who was seen today for physical therapy evaluation and treatment for B knee pain.  Evaluation demonstrates decreased strength, decreased balance, and  increased pain.  The pt walks several miles with his wife each day but at times has to place a hand on her shoulder for balance, he is also unable to get off the floor by himself and would like to be able to do this as due to his balance and neuropathy he does fall.  Mr. Tinnell will benefit from skilled PT to address these issues and get the pt to a point where he can get himself off of the floor.   OBJECTIVE IMPAIRMENTS: Abnormal gait, decreased strength, and pain.   ACTIVITY LIMITATIONS: carrying, lifting, squatting, and stairs  PARTICIPATION LIMITATIONS: shopping and community activity  PERSONAL FACTORS: 1-2 comorbidities: back surgery, peripheral neuropathy  are also affecting patient's functional outcome.   REHAB POTENTIAL: Good  CLINICAL DECISION MAKING: Evolving/moderate complexity  EVALUATION COMPLEXITY: Moderate   GOALS: Goals reviewed with patient? No  SHORT TERM GOALS: Target date: 01/13/23 Pt to be I in HEP in order to increase his strength 1/2 grade to improve knee stability and decrease pain to no more than 6/10  Baseline: Goal status: on-going   2.  PT to be able to single leg stance on his LE for 10" to reduce risk of falls  Baseline:  Goal status: on-going   LONG TERM GOALS: Target date: 02/03/23  Pt to be I in an advanced HEP in order to allow pt to be able to I get himself off the floor  Baseline:  Goal status: on-going  2.  Pt to be able to sls for 20" in order to reduce risk of falls.  Baseline:  Goal status: on-going  3.  PT pain to be no greater than a 4/10 Baseline:  Goal status: on-going    PLAN:  PT FREQUENCY: 2x/week  PT DURATION: 6 weeks  PLANNED INTERVENTIONS: Therapeutic exercises, Therapeutic activity, Neuromuscular re-education, Balance training, Gait training, Patient/Family education, Self Care, Joint mobilization, and Manual therapy  PLAN FOR NEXT SESSION: begin tandem stance, cybex hamstring, body craft hip extension, sit to  stand, squats, progress as able.  2:34 PM, 01/05/23 Wyman Songster PT, DPT Physical Therapist at [facility]

## 2023-01-07 ENCOUNTER — Encounter (HOSPITAL_COMMUNITY): Payer: Self-pay

## 2023-01-07 ENCOUNTER — Ambulatory Visit (HOSPITAL_COMMUNITY): Payer: Medicare Other

## 2023-01-07 DIAGNOSIS — M6281 Muscle weakness (generalized): Secondary | ICD-10-CM | POA: Diagnosis not present

## 2023-01-07 DIAGNOSIS — R296 Repeated falls: Secondary | ICD-10-CM

## 2023-01-07 DIAGNOSIS — G8929 Other chronic pain: Secondary | ICD-10-CM

## 2023-01-07 NOTE — Therapy (Signed)
OUTPATIENT PHYSICAL THERAPY LOWER EXTREMITY Treatment    Patient Name: Jorge Bishop MRN: [id number] DOB:[birth year], 87 y.o., male Today's Date: 01/07/2023  END OF SESSION:   PT End of Session - 01/07/23 1256     Visit Number 4    Number of Visits 12    Date for PT Re-Evaluation 02/03/23    Authorization Type medicare    Progress Note Due on Visit 10    PT Start Time 1301    PT Stop Time 1343    PT Time Calculation (min) 42 min    Equipment Utilized During Treatment --   SBA/CGA during cybex activities for safety   Activity Tolerance Patient tolerated treatment well    Behavior During Therapy WFL for tasks assessed/performed               Past Medical History:  Diagnosis Date   Arteriosclerotic cardiovascular disease (ASCVD)    Coronary artery bypass graft surgery in 12/98; DES to the RCA SVG in 07/2002; restenosis of the saphenous vein graft stent and renal intervention in 2006; 70% first diagonal present; calf in 11/2007-total obstruction of the saphenous vein graft to the first marginal   Benign prostatic hypertrophy    Cerebrovascular disease    60% bilateral internal carotid artery stenosis in 2012   Coronary artery disease    4 stents   Diaphragmatic hernia 01/15/2021   Diverticulosis    DJD (degenerative joint disease)    Erectile dysfunction    GERD (gastroesophageal reflux disease)    History of blood transfusion    History of kidney stones    Hyperlipidemia    Lipid profile in 12/2009:104, 91, 36, 50   Hypertension    Kidney stone 09/08/2017   Migraine headache    Right bundle branch block    Right ureteral stone 03/08/2019   Shortness of breath    Thrombocytopenia (HCC)    platelets of 104 in 03/2009   Upper gastrointestinal hemorrhage 10/09    Helicobacter pylori positive; subsequent laparoscopic fundoplication-2009   Past Surgical History:  Procedure Laterality Date   CARDIAC CATHETERIZATION     4 cardiac stents   CATARACT EXTRACTION W/PHACO Right  07/25/2012   Procedure: CATARACT EXTRACTION PHACO AND INTRAOCULAR LENS PLACEMENT (IOC);  Surgeon: Gemma Payor, MD;  Location: [facility];  Service: Ophthalmology;  Laterality: Right;  CDE:23.31   CATARACT EXTRACTION W/PHACO Left 08/18/2012   Procedure: CATARACT EXTRACTION PHACO AND INTRAOCULAR LENS PLACEMENT (IOC);  Surgeon: Gemma Payor, MD;  Location: [facility];  Service: Ophthalmology;  Laterality: Left;  CDE: 16.07   COLONOSCOPY N/A 08/01/2014   Procedure: COLONOSCOPY;  Surgeon: Malissa Hippo, MD;  [facility];  Service: Endoscopy;  Laterality: N/A;  125 - moved to 10:30 - Ann to notify pt   CORONARY ARTERY BYPASS GRAFT  1998    CYSTOSCOPY/URETEROSCOPY/HOLMIUM LASER/STENT PLACEMENT Right 11/12/2017   Procedure: CYSTOSCOPY/RIGHT RETROGRADE PYELOGRAM/RIGHT URETEROSCOPY/HOLMIUM LASER LITHOTRIPSY RIGHT URETERAL CALCULUS/RIGHT URETERAL STENT PLACEMENT;  Surgeon: Bjorn Pippin, MD;  Location: [facility];  Service: Urology;  Laterality: Right;  right ureteral calculus given to patient's wife per Dr. Annabell Howells   CYSTOSCOPY/URETEROSCOPY/HOLMIUM LASER/STENT PLACEMENT Right 03/07/2019   Procedure: CYSTOSCOPY RIGHT RETROGRADE RIGHT URETEROSCOPY;  Surgeon: Bjorn Pippin, MD;  Location: [facility];  Service: Urology;  Laterality: Right;   EXTRACORPOREAL SHOCK WAVE LITHOTRIPSY Right 09/13/2017   Procedure: RIGHT EXTRACORPOREAL SHOCK WAVE LITHOTRIPSY (ESWL);  Surgeon: Crista Elliot, MD;  Location: [facility];  Service: Urology;  Laterality: Right;  HERNIA REPAIR     right inguinal   LAPAROSCOPIC NISSEN FUNDOPLICATION  1997, 2009    revision in 2009 along with repair of paraesophagel hernia    PARAESOPHAGEAL HERNIA REPAIR     TRANSURETHRAL RESECTION OF PROSTATE N/A 03/07/2019   Procedure: TRANSURETHRAL RESECTION OF THE PROSTATE (TURP);  Surgeon: Bjorn Pippin, MD;  Location: [facility];  Service: Urology;  Laterality: N/A;   Patient Active Problem List   Diagnosis Date Noted   Aortic regurgitation 12/14/2022   Primary  osteoarthritis of both knees 11/18/2022   Right inguinal hernia 08/12/2022   Chronic gout involving toe of right foot without tophus 04/07/2022   Chronic constipation 04/07/2022   Senile purpura (HCC) 09/25/2021   MGUS (monoclonal gammopathy of unknown significance) 09/03/2021   Idiopathic peripheral neuropathy 05/21/2021   History of coronary artery bypass graft 01/15/2021   Asymptomatic carotid artery stenosis 01/15/2021   Bilateral pseudophakia 01/15/2021   Gastroesophageal reflux disease 01/15/2021   Osteopenia 01/15/2021   Right bundle branch block 01/15/2021   Skin cancer 01/15/2021   Spinal stenosis of lumbar region 01/15/2021   Vitamin D deficiency 01/15/2021   Hypothyroidism 01/15/2021   Dizziness 08/13/2020   Neck pain 01/30/2020   Osteoarthritis of spine with radiculopathy, lumbar region 01/30/2020   Benign localized prostatic hyperplasia with lower urinary tract symptoms (LUTS) 03/07/2019   Cervical myelopathy (HCC) 01/16/2019   Protein-calorie malnutrition, severe 09/11/2017   Hyperlipidemia    Cerebrovascular disease    Thrombocytopenia (HCC)    Erectile dysfunction    CAD (coronary artery disease)    Hypertension 01/16/2009    PCP: Trena Platt  REFERRING PROVIDER: Oliver Barre, MD  REFERRING DIAG: Impression: Bilateral mild to moderate knee arthritis    THERAPY DIAG:  Muscle weakness (generalized)  Chronic pain of right knee  Chronic pain of left knee  Repeated falls  Rationale for Evaluation and Treatment: Rehabilitation  ONSET DATE: chronic but noted increased pain in the past 6 months    SUBJECTIVE STATEMENT:   01/07/23:  Reports he was tired following last session.  Reports most difficulty with balance, no recent falls does have occasional LOB.     Eval:  PT states that he has had pain in his knees for years but in the past 6 months he has noted significant increased pain.  He states at his age he does not want a TKR.  He is having  injections and has had the first of three.  He is most concerned that he is unable to get up off the floor when falls.  He has no rhyme or reason of falling.    PERTINENT HISTORY: Vertigo for 30 years, OA, Torn RTC in LT shoulder, back surgery  PAIN:  Are you having pain? Yes: Pain location: knee Pain description: aches Aggravating factors: activity Relieving factors: not sure but the injection seems to be helping  Pain varies when WB from a 5-8 PRECAUTIONS: Fall  WEIGHT BEARING RESTRICTIONS: No  FALLS:  Has patient fallen in last 6 months? Yes. Number of falls 3  LIVING ENVIRONMENT: Lives with: lives with their family and lives with their spouse Lives in: House/apartment Stairs: Yes: Internal: 10 steps; on right going up downstairs but does not need to go down  Has following equipment at home: Single point cane  OCCUPATION: retiered  PLOF: Independent  PATIENT GOALS: to be able to get up off the floor   NEXT MD VISIT: 12/29/2022  OBJECTIVE:   DIAGNOSTIC FINDINGS: Impression: Bilateral mild  to moderate knee arthritis    PATIENT SURVEYS:  FOTO 57  COGNITION: Overall cognitive status: Within functional limits for tasks assessed       LOWER EXTREMITY ROM:WFL  LOWER EXTREMITY MMT:  MMT Right eval Left eval  Hip flexion 4+ 5  Hip extension 2 2+  Hip abduction 3+ 4  Hip adduction    Hip internal rotation    Hip external rotation    Knee flexion 3 3+  Knee extension 4+/5 5  Ankle dorsiflexion 5 5  Ankle plantarflexion    Ankle inversion    Ankle eversion     (Blank rows = not tested) FUNCTIONAL TESTS:  30 seconds chair stand test:  unable without UE assist :  with UE assist 9 x ; 7 x is poor 11 is average  2 minute walk test: 390 Ft  Single leg stance:  RT: 2"    , Lt:1"   TODAY'S TREATMENT:                                                                                                                              DATE: 7/35/24 Partial Tandem stance  2x 30" Vector stance 5x 5" 1 HHA Eccentric STS (no HHA) to heel raise with min HHA 10x  Squat 2x 10 Bodycraft walkout retro and sidestep both direction 5RT each 3Pl (SBA/CGA for safety)  01/05/23 HR 1 x 20 Mini squat x 20 Standing hip abduction RTB at calves 2 x 10 bilateral Standing hip extension RTB at calves 2 x 10 bilateral Step up 6 inch 2 x 10 bilateral  SLS with vectors 5 x 5 second holds bilateral  Lunge 1 x 10 bilateral  Bodycraft  4 Pl leg press x 15  12/30/22 Heel raise x 10  Tandem stance 5x with Rt in front; 5 with LT in front  Rocker board x 2 minutes  Step up 4" step Rt x 10 , Lt x 10  Mini squat x 15 Marching x 10  Sit to stand x 10 Bodycraft  4 Pl leg press x 10  Cybex knee flexion 3Pl  x 10  Nustep level 2, hills  x 5 minutes    12/23/22 evaluation  12/23/22 Sit to stand x 10 Mini squat x 10  PATIENT EDUCATION:  Education details: HEP Person educated: Patient Education method: Explanation Education comprehension: verbalized understanding  HOME EXERCISE PROGRAM: Access Code: [email or web address] URL: [email or web address] Date: 12/23/2022 Prepared by: Virgina Organ  Exercises - Supine Bridge  - 2 x daily - 7 x weekly - 1 sets - 10 reps - 5" hold - Mini Squat with Counter Support  - 2 x daily - 7 x weekly - 1 sets - 10 reps - 5" hold - Sit to Stand with Counter Support  - 2 x daily - 7 x weekly - 1 sets - 10 reps 12/30/22 - Tandem Stance with Support  - 2 x daily - 7 x weekly -  1 sets - 5 reps - 15" hold - Heel Raises with Counter Support  - 2 x daily - 7 x weekly - 1 sets - 10 reps - 5" hold - Supine Hamstring Stretch  - 2 x daily - 7 x weekly - 1 sets - 3 reps - 30" hold  7/23  - Standing Hip Abduction with Resistance at Ankles and Counter Support  - 1 x daily - 7 x weekly - 2 sets - 10 reps - Standing Hip Extension with Resistance at Ankles and Counter Support  - 1 x daily - 7 x weekly - 2 sets - 10 reps  ASSESSMENT:  CLINICAL IMPRESSION:   Session focus on functional strengthening and balance training.  Added static and dynamic balance activities with intermittent HHA required inside // bars and SBA/CGA during new bodycraft walkout for safety during sidestep.     Patient is a 87 y.o. male  who was seen today for physical therapy evaluation and treatment for B knee pain.  Evaluation demonstrates decreased strength, decreased balance, and increased pain.  The pt walks several miles with his wife each day but at times has to place a hand on her shoulder for balance, he is also unable to get off the floor by himself and would like to be able to do this as due to his balance and neuropathy he does fall.  Mr. Misner will benefit from skilled PT to address these issues and get the pt to a point where he can get himself off of the floor.   OBJECTIVE IMPAIRMENTS: Abnormal gait, decreased strength, and pain.   ACTIVITY LIMITATIONS: carrying, lifting, squatting, and stairs  PARTICIPATION LIMITATIONS: shopping and community activity  PERSONAL FACTORS: 1-2 comorbidities: back surgery, peripheral neuropathy  are also affecting patient's functional outcome.   REHAB POTENTIAL: Good  CLINICAL DECISION MAKING: Evolving/moderate complexity  EVALUATION COMPLEXITY: Moderate   GOALS: Goals reviewed with patient? No  SHORT TERM GOALS: Target date: 01/13/23 Pt to be I in HEP in order to increase his strength 1/2 grade to improve knee stability and decrease pain to no more than 6/10  Baseline: Goal status: on-going   2.  PT to be able to single leg stance on his LE for 10" to reduce risk of falls  Baseline:  Goal status: on-going   LONG TERM GOALS: Target date: 02/03/23  Pt to be I in an advanced HEP in order to allow pt to be able to I get himself off the floor  Baseline:  Goal status: on-going  2.  Pt to be able to sls for 20" in order to reduce risk of falls.  Baseline:  Goal status: on-going  3.  PT pain to be no greater than a  4/10 Baseline:  Goal status: on-going    PLAN:  PT FREQUENCY: 2x/week  PT DURATION: 6 weeks  PLANNED INTERVENTIONS: Therapeutic exercises, Therapeutic activity, Neuromuscular re-education, Balance training, Gait training, Patient/Family education, Self Care, Joint mobilization, and Manual therapy  PLAN FOR NEXT SESSION: begin tandem stance, cybex hamstring, body craft hip extension, sit to stand, squats, progress as able.  Becky Sax, LPTA/CLT; CBIS [phone]  Juel Burrow, PTA 01/07/2023, 5:00 PM  5:00 PM, 01/07/23

## 2023-01-11 ENCOUNTER — Ambulatory Visit (HOSPITAL_COMMUNITY): Payer: Medicare Other | Admitting: Physical Therapy

## 2023-01-11 DIAGNOSIS — G8929 Other chronic pain: Secondary | ICD-10-CM

## 2023-01-11 DIAGNOSIS — M6281 Muscle weakness (generalized): Secondary | ICD-10-CM

## 2023-01-11 NOTE — Therapy (Signed)
OUTPATIENT PHYSICAL THERAPY LOWER EXTREMITY Treatment    Patient Name: Jorge Bishop MRN: [id number] DOB:Oct 05, 1935, 87 y.o., male Today's Date: 01/11/2023  END OF SESSION:   PT End of Session - 01/11/23 1340    Visit Number 5    Number of Visits 12    Date for PT Re-Evaluation 02/03/23    Authorization Type medicare    Progress Note Due on Visit 10    PT Start Time 1300    PT Stop Time 1340    PT Time Calculation (min) 40 min    Equipment Utilized During Treatment --   SBA/CGA during cybex activities for safety   Activity Tolerance Patient tolerated treatment well    Behavior During Therapy WFL for tasks assessed/performed               Past Medical History:  Diagnosis Date   Arteriosclerotic cardiovascular disease (ASCVD)    Coronary artery bypass graft surgery in 12/98; DES to the RCA SVG in 07/2002; restenosis of the saphenous vein graft stent and renal intervention in 2006; 70% first diagonal present; calf in 11/2007-total obstruction of the saphenous vein graft to the first marginal   Benign prostatic hypertrophy    Cerebrovascular disease    60% bilateral internal carotid artery stenosis in 2012   Coronary artery disease    4 stents   Diaphragmatic hernia 01/15/2021   Diverticulosis    DJD (degenerative joint disease)    Erectile dysfunction    GERD (gastroesophageal reflux disease)    History of blood transfusion    History of kidney stones    Hyperlipidemia    Lipid profile in 12/2009:104, 91, 36, 50   Hypertension    Kidney stone 09/08/2017   Migraine headache    Right bundle branch block    Right ureteral stone 03/08/2019   Shortness of breath    Thrombocytopenia (HCC)    platelets of 104 in 03/2009   Upper gastrointestinal hemorrhage 10/09    Helicobacter pylori positive; subsequent laparoscopic fundoplication-2009   Past Surgical History:  Procedure Laterality Date   CARDIAC CATHETERIZATION     4 cardiac stents   CATARACT EXTRACTION W/PHACO Right  07/25/2012   Procedure: CATARACT EXTRACTION PHACO AND INTRAOCULAR LENS PLACEMENT (IOC);  Surgeon: Gemma Payor, MD;  Location: [facility];  Service: Ophthalmology;  Laterality: Right;  CDE:23.31   CATARACT EXTRACTION W/PHACO Left 08/18/2012   Procedure: CATARACT EXTRACTION PHACO AND INTRAOCULAR LENS PLACEMENT (IOC);  Surgeon: Gemma Payor, MD;  Location: [facility];  Service: Ophthalmology;  Laterality: Left;  CDE: 16.07   COLONOSCOPY N/A 08/01/2014   Procedure: COLONOSCOPY;  Surgeon: Malissa Hippo, MD;  Location: [facility];  Service: Endoscopy;  Laterality: N/A;  125 - moved to 10:30 - Ann to notify pt   CORONARY ARTERY BYPASS GRAFT  1998    CYSTOSCOPY/URETEROSCOPY/HOLMIUM LASER/STENT PLACEMENT Right 11/12/2017   Procedure: CYSTOSCOPY/RIGHT RETROGRADE PYELOGRAM/RIGHT URETEROSCOPY/HOLMIUM LASER LITHOTRIPSY RIGHT URETERAL CALCULUS/RIGHT URETERAL STENT PLACEMENT;  Surgeon: Bjorn Pippin, MD;  Location: [facility];  Service: Urology;  Laterality: Right;  right ureteral calculus given to patient's wife per Dr. Annabell Howells   CYSTOSCOPY/URETEROSCOPY/HOLMIUM LASER/STENT PLACEMENT Right 03/07/2019   Procedure: CYSTOSCOPY RIGHT RETROGRADE RIGHT URETEROSCOPY;  Surgeon: Bjorn Pippin, MD;  Location: [facility];  Service: Urology;  Laterality: Right;   EXTRACORPOREAL SHOCK WAVE LITHOTRIPSY Right 09/13/2017   Procedure: RIGHT EXTRACORPOREAL SHOCK WAVE LITHOTRIPSY (ESWL);  Surgeon: Crista Elliot, MD;  Location: [facility];  Service: Urology;  Laterality: Right;   HERNIA  REPAIR     right inguinal   LAPAROSCOPIC NISSEN FUNDOPLICATION  1997, 2009    revision in 2009 along with repair of paraesophagel hernia    PARAESOPHAGEAL HERNIA REPAIR     TRANSURETHRAL RESECTION OF PROSTATE N/A 03/07/2019   Procedure: TRANSURETHRAL RESECTION OF THE PROSTATE (TURP);  Surgeon: Bjorn Pippin, MD;  Location: [facility];  Service: Urology;  Laterality: N/A;   Patient Active Problem List   Diagnosis Date Noted   Aortic regurgitation 12/14/2022   Primary  osteoarthritis of both knees 11/18/2022   Right inguinal hernia 08/12/2022   Chronic gout involving toe of right foot without tophus 04/07/2022   Chronic constipation 04/07/2022   Senile purpura (HCC) 09/25/2021   MGUS (monoclonal gammopathy of unknown significance) 09/03/2021   Idiopathic peripheral neuropathy 05/21/2021   History of coronary artery bypass graft 01/15/2021   Asymptomatic carotid artery stenosis 01/15/2021   Bilateral pseudophakia 01/15/2021   Gastroesophageal reflux disease 01/15/2021   Osteopenia 01/15/2021   Right bundle branch block 01/15/2021   Skin cancer 01/15/2021   Spinal stenosis of lumbar region 01/15/2021   Vitamin D deficiency 01/15/2021   Hypothyroidism 01/15/2021   Dizziness 08/13/2020   Neck pain 01/30/2020   Osteoarthritis of spine with radiculopathy, lumbar region 01/30/2020   Benign localized prostatic hyperplasia with lower urinary tract symptoms (LUTS) 03/07/2019   Cervical myelopathy (HCC) 01/16/2019   Protein-calorie malnutrition, severe 09/11/2017   Hyperlipidemia    Cerebrovascular disease    Thrombocytopenia (HCC)    Erectile dysfunction    CAD (coronary artery disease)    Hypertension 01/16/2009    PCP: Trena Platt  REFERRING PROVIDER: Oliver Barre, MD  REFERRING DIAG: Impression: Bilateral mild to moderate knee arthritis    THERAPY DIAG:  Muscle weakness (generalized)  Chronic pain of right knee  Chronic pain of left knee  Rationale for Evaluation and Treatment: Rehabilitation  ONSET DATE: chronic but noted increased pain in the past 6 months    SUBJECTIVE STATEMENT:   Pt states that he walked for 53 minutes without stopping this weekend and was pleased.  Eval:  PT states that he has had pain in his knees for years but in the past 6 months he has noted significant increased pain.  He states at his age he does not want a TKR.  He is having injections and has had the first of three.  He is most concerned that he is  unable to get up off the floor when falls.  He has no rhyme or reason of falling.    PERTINENT HISTORY: Vertigo for 30 years, OA, Torn RTC in LT shoulder, back surgery  PAIN:  Are you having pain? no: Pain location: knee Pain description: aches Aggravating factors: activity Relieving factors: not sure but the injection seems to be helping  Pain varies when WB from a 5-8 PRECAUTIONS: Fall  WEIGHT BEARING RESTRICTIONS: No  FALLS:  Has patient fallen in last 6 months? Yes. Number of falls 3  LIVING ENVIRONMENT: Lives with: lives with their family and lives with their spouse Lives in: House/apartment Stairs: Yes: Internal: 10 steps; on right going up downstairs but does not need to go down  Has following equipment at home: Single point cane  OCCUPATION: retiered  PLOF: Independent  PATIENT GOALS: to be able to get up off the floor   NEXT MD VISIT: 12/29/2022  OBJECTIVE:   DIAGNOSTIC FINDINGS: Impression: Bilateral mild to moderate knee arthritis    PATIENT SURVEYS:  FOTO 57  COGNITION:  Overall cognitive status: Within functional limits for tasks assessed       LOWER EXTREMITY ROM:WFL  LOWER EXTREMITY MMT:  MMT Right eval Left eval  Hip flexion 4+ 5  Hip extension 2 2+  Hip abduction 3+ 4  Hip adduction    Hip internal rotation    Hip external rotation    Knee flexion 3 3+  Knee extension 4+/5 5  Ankle dorsiflexion 5 5  Ankle plantarflexion    Ankle inversion    Ankle eversion     (Blank rows = not tested) FUNCTIONAL TESTS:  30 seconds chair stand test:  unable without UE assist :  with UE assist 9 x ; 7 x is poor 11 is average  2 minute walk test: 390 Ft  Single leg stance:  RT: 2"    , Lt:1"   TODAY'S TREATMENT:                                                                                                                              DATE: 01/11/23 Heel rise x 15 Squat x 15 Marching x 15  Lunge forward then back Rt then Lt x 10  Tandem stance   Step up 4" x 10 Rt and LT Sit to stand x 5 Body craft leg press 5 Pl x 15 Hamstring curl 4 Pl x 15  Hip abduction green theraband x 10 Hip extension green theraband x 10  01/07/23 Partial Tandem stance 2x 30" Vector stance 5x 5" 1 HHA Eccentric STS (no HHA) to heel raise with min HHA 10x  Squat 2x 10 Bodycraft walkout retro and sidestep both direction 5RT each 3Pl (SBA/CGA for safety)  01/05/23 HR 1 x 20 Mini squat x 20 Standing hip abduction RTB at calves 2 x 10 bilateral Standing hip extension RTB at calves 2 x 10 bilateral Step up 6 inch 2 x 10 bilateral  SLS with vectors 5 x 5 second holds bilateral  Lunge 1 x 10 bilateral  Bodycraft  4 Pl leg press x 15  12/30/22 Heel raise x 10  Tandem stance 5x with Rt in front; 5 with LT in front  Rocker board x 2 minutes  Step up 4" step Rt x 10 , Lt x 10  Mini squat x 15 Marching x 10  Sit to stand x 10 Bodycraft  4 Pl leg press x 10  Cybex knee flexion 3Pl  x 10  Nustep level 2, hills  x 5 minutes    12/23/22 evaluation  12/23/22 Sit to stand x 10 Mini squat x 10  PATIENT EDUCATION:  Education details: HEP Person educated: Patient Education method: Explanation Education comprehension: verbalized understanding  HOME EXERCISE PROGRAM: Access Code: [email or web address] URL: [email or web address] Date: 12/23/2022 Prepared by: Virgina Organ  Exercises - Supine Bridge  - 2 x daily - 7 x weekly - 1 sets - 10 reps - 5" hold - Mini Squat with Counter Support  - 2  x daily - 7 x weekly - 1 sets - 10 reps - 5" hold - Sit to Stand with Counter Support  - 2 x daily - 7 x weekly - 1 sets - 10 reps 12/30/22 - Tandem Stance with Support  - 2 x daily - 7 x weekly - 1 sets - 5 reps - 15" hold - Heel Raises with Counter Support  - 2 x daily - 7 x weekly - 1 sets - 10 reps - 5" hold - Supine Hamstring Stretch  - 2 x daily - 7 x weekly - 1 sets - 3 reps - 30" hold  7/23  - Standing Hip Abduction with Resistance at Ankles and Counter  Support  - 1 x daily - 7 x weekly - 2 sets - 10 reps - Standing Hip Extension with Resistance at Ankles and Counter Support  - 1 x daily - 7 x weekly - 2 sets - 10 reps  ASSESSMENT:  CLINICAL IMPRESSION: Pt improving in activity endurance as evident of 53 minute walk.   Therapist increased strengthening exercises with step ups, and cybex knee flexion.  Pt continues to have deficits in strength, decreased balance, and increased pain and will benefit from skilled PT.      Patient is a 87 y.o. male  who was seen today for physical therapy evaluation and treatment for B knee pain.  Evaluation demonstrates decreased strength, decreased balance, and increased pain.  The pt walks several miles with his wife each day but at times has to place a hand on her shoulder for balance, he is also unable to get off the floor by himself and would like to be able to do this as due to his balance and neuropathy he does fall.  Mr. Reiser will benefit from skilled PT to address these issues and get the pt to a point where he can get himself off of the floor.   OBJECTIVE IMPAIRMENTS: Abnormal gait, decreased strength, and pain.   ACTIVITY LIMITATIONS: carrying, lifting, squatting, and stairs  PARTICIPATION LIMITATIONS: shopping and community activity  PERSONAL FACTORS: 1-2 comorbidities: back surgery, peripheral neuropathy  are also affecting patient's functional outcome.   REHAB POTENTIAL: Good  CLINICAL DECISION MAKING: Evolving/moderate complexity  EVALUATION COMPLEXITY: Moderate   GOALS: Goals reviewed with patient? No  SHORT TERM GOALS: Target date: 01/13/23 Pt to be I in HEP in order to increase his strength 1/2 grade to improve knee stability and decrease pain to no more than 6/10  Baseline: Goal status: met  2.  PT to be able to single leg stance on his LE for 10" to reduce risk of falls  Baseline:  Goal status: on-going   LONG TERM GOALS: Target date: 02/03/23  Pt to be I in an advanced HEP  in order to allow pt to be able to I get himself off the floor  Baseline:  Goal status: on-going  2.  Pt to be able to sls for 20" in order to reduce risk of falls.  Baseline:  Goal status: on-going  3.  PT pain to be no greater than a 4/10 Baseline:  Goal status: met     PLAN:  PT FREQUENCY: 2x/week  PT DURATION: 6 weeks  PLANNED INTERVENTIONS: Therapeutic exercises, Therapeutic activity, Neuromuscular re-education, Balance training, Gait training, Patient/Family education, Self Care, Joint mobilization, and Manual therapy  PLAN FOR NEXT SESSION: begin tandem stance, cybex hamstring, body craft hip extension, sit to stand, squats, progress as able.  Virgina Organ, PT  CLT [phone]  01/11/2023, 1:40 PM  1:40 PM, 01/11/23

## 2023-01-13 ENCOUNTER — Encounter: Payer: Self-pay | Admitting: Internal Medicine

## 2023-01-13 ENCOUNTER — Ambulatory Visit (INDEPENDENT_AMBULATORY_CARE_PROVIDER_SITE_OTHER): Payer: Medicare Other | Admitting: Internal Medicine

## 2023-01-13 ENCOUNTER — Ambulatory Visit (HOSPITAL_COMMUNITY): Payer: Medicare Other | Admitting: Physical Therapy

## 2023-01-13 VITALS — BP 121/74 | HR 86 | Ht 72.0 in | Wt 168.6 lb

## 2023-01-13 DIAGNOSIS — G609 Hereditary and idiopathic neuropathy, unspecified: Secondary | ICD-10-CM

## 2023-01-13 DIAGNOSIS — K121 Other forms of stomatitis: Secondary | ICD-10-CM | POA: Diagnosis not present

## 2023-01-13 DIAGNOSIS — G8929 Other chronic pain: Secondary | ICD-10-CM

## 2023-01-13 DIAGNOSIS — M6281 Muscle weakness (generalized): Secondary | ICD-10-CM

## 2023-01-13 MED ORDER — LIDOCAINE VISCOUS HCL 2 % MT SOLN
15.0000 mL | OROMUCOSAL | 0 refills | Status: DC | PRN
Start: 2023-01-13 — End: 2023-03-22

## 2023-01-13 NOTE — Assessment & Plan Note (Signed)
Onset of oral lesions after 3 weeks of Cymbalta -unclear if allergic reaction versus incidental stomatitis Viscous lidocaine as needed for oral lesions Avoid spicy food If persistent, will discontinue Cymbalta

## 2023-01-13 NOTE — Progress Notes (Signed)
Acute Office Visit  Subjective:    Patient ID: Jorge Bishop, male    DOB: 1935/10/03, 87 y.o.   MRN: [id number]  Chief Complaint  Patient presents with   Allergic Reaction    Patient thinks he is having an allergic reaction to Cymbalta    HPI Patient is in today for evaluation of ulcers in the lower lip area for the last 10 days.  He has also noticed mild bleeding from the lip lesions.  Denies any lip swelling, dyspnea or dysphagia currently.  Denies any other skin rash.  He was recently placed on Cymbalta about 5 weeks ago for peripheral neuropathy.  He has responded very well to Cymbalta, he reports that his leg weakness has improved now with it.  He was having drowsiness with gabapentin, which had to be discontinued and he was later placed on Cymbalta.  Past Medical History:  Diagnosis Date   Arteriosclerotic cardiovascular disease (ASCVD)    Coronary artery bypass graft surgery in 12/98; DES to the RCA SVG in 07/2002; restenosis of the saphenous vein graft stent and renal intervention in 2006; 70% first diagonal present; calf in 11/2007-total obstruction of the saphenous vein graft to the first marginal   Benign prostatic hypertrophy    Cerebrovascular disease    60% bilateral internal carotid artery stenosis in 2012   Coronary artery disease    4 stents   Diaphragmatic hernia 01/15/2021   Diverticulosis    DJD (degenerative joint disease)    Erectile dysfunction    GERD (gastroesophageal reflux disease)    History of blood transfusion    History of kidney stones    Hyperlipidemia    Lipid profile in 12/2009:104, 91, 36, 50   Hypertension    Kidney stone 09/08/2017   Migraine headache    Right bundle branch block    Right ureteral stone 03/08/2019   Shortness of breath    Thrombocytopenia (HCC)    platelets of 104 in 03/2009   Upper gastrointestinal hemorrhage 10/09    Helicobacter pylori positive; subsequent laparoscopic fundoplication-2009    Past Surgical History:   Procedure Laterality Date   CARDIAC CATHETERIZATION     4 cardiac stents   CATARACT EXTRACTION W/PHACO Right 07/25/2012   Procedure: CATARACT EXTRACTION PHACO AND INTRAOCULAR LENS PLACEMENT (IOC);  Surgeon: Gemma Payor, MD;  Location: [facility];  Service: Ophthalmology;  Laterality: Right;  CDE:23.31   CATARACT EXTRACTION W/PHACO Left 08/18/2012   Procedure: CATARACT EXTRACTION PHACO AND INTRAOCULAR LENS PLACEMENT (IOC);  Surgeon: Gemma Payor, MD;  Location: [facility];  Service: Ophthalmology;  Laterality: Left;  CDE: 16.07   COLONOSCOPY N/A 08/01/2014   Procedure: COLONOSCOPY;  Surgeon: Malissa Hippo, MD;  [facility];  Service: Endoscopy;  Laterality: N/A;  125 - moved to 10:30 - Ann to notify pt   CORONARY ARTERY BYPASS GRAFT  1998    CYSTOSCOPY/URETEROSCOPY/HOLMIUM LASER/STENT PLACEMENT Right 11/12/2017   Procedure: CYSTOSCOPY/RIGHT RETROGRADE PYELOGRAM/RIGHT URETEROSCOPY/HOLMIUM LASER LITHOTRIPSY RIGHT URETERAL CALCULUS/RIGHT URETERAL STENT PLACEMENT;  Surgeon: Bjorn Pippin, MD;  Location: [facility];  Service: Urology;  Laterality: Right;  right ureteral calculus given to patient's wife per Dr. Annabell Howells   CYSTOSCOPY/URETEROSCOPY/HOLMIUM LASER/STENT PLACEMENT Right 03/07/2019   Procedure: CYSTOSCOPY RIGHT RETROGRADE RIGHT URETEROSCOPY;  Surgeon: Bjorn Pippin, MD;  Location: [facility];  Service: Urology;  Laterality: Right;   EXTRACORPOREAL SHOCK WAVE LITHOTRIPSY Right 09/13/2017   Procedure: RIGHT EXTRACORPOREAL SHOCK WAVE LITHOTRIPSY (ESWL);  Surgeon: Crista Elliot, MD;  Location: [facility];  Service: Urology;  Laterality: Right;   HERNIA REPAIR     right inguinal   LAPAROSCOPIC NISSEN FUNDOPLICATION  1997, 2009    revision in 2009 along with repair of paraesophagel hernia    PARAESOPHAGEAL HERNIA REPAIR     TRANSURETHRAL RESECTION OF PROSTATE N/A 03/07/2019   Procedure: TRANSURETHRAL RESECTION OF THE PROSTATE (TURP);  Surgeon: Bjorn Pippin, MD;  Location: [facility];  Service: Urology;  Laterality: N/A;     Family History  Problem Relation Age of Onset   CAD Father     Social History   Socioeconomic History   Marital status: Married    Spouse name: Not on file   Number of children: Not on file   Years of education: Not on file   Highest education level: Not on file  Occupational History   Occupation: Full time    Employer: RETIRED  Tobacco Use   Smoking status: Never   Smokeless tobacco: Never  Vaping Use   Vaping status: Never Used  Substance and Sexual Activity   Alcohol use: No    Alcohol/week: 0.0 standard drinks of alcohol   Drug use: No   Sexual activity: Yes    Birth control/protection: None  Other Topics Concern   Not on file  Social History Narrative   Right handed   Drinks caffeine   Two story home   Social Determinants of Health   Financial Resource Strain: Low Risk  (02/17/2022)   Overall Financial Resource Strain (CARDIA)    Difficulty of Paying Living Expenses: Not hard at all  Food Insecurity: No Food Insecurity (02/17/2022)   Hunger Vital Sign    Worried About Running Out of Food in the Last Year: Never true    Ran Out of Food in the Last Year: Never true  Transportation Needs: No Transportation Needs (02/17/2022)   PRAPARE - Administrator, Civil Service (Medical): No    Lack of Transportation (Non-Medical): No  Physical Activity: Sufficiently Active (02/17/2022)   Exercise Vital Sign    Days of Exercise per Week: 5 days    Minutes of Exercise per Session: 60 min  Stress: No Stress Concern Present (02/17/2022)   [employer] of Occupational Health - Occupational Stress Questionnaire    Feeling of Stress : Not at all  Social Connections: Moderately Isolated (02/17/2022)   Social Connection and Isolation Panel [NHANES]    Frequency of Communication with Friends and Family: More than three times a week    Frequency of Social Gatherings with Friends and Family: More than three times a week    Attends Religious Services: Never    Automotive engineer or Organizations: No    Attends Banker Meetings: Never    Marital Status: Married  Catering manager Violence: Unknown (02/17/2022)   Humiliation, Afraid, Rape, and Kick questionnaire    Fear of Current or Ex-Partner: No    Emotionally Abused: Not on file    Physically Abused: No    Sexually Abused: No    Outpatient Medications Prior to Visit  Medication Sig Dispense Refill   aspirin 81 MG tablet Take 81 mg by mouth daily.       atorvastatin (LIPITOR) 20 MG tablet Take 20 mg by mouth at bedtime.       carvedilol (COREG) 6.25 MG tablet Take 1 tablet (6.25 mg total) by mouth daily. 90 tablet 2   Cholecalciferol (VITAMIN D3) 50 MCG (2000 UT) TABS Take 2,000 Units by mouth daily.  cyanocobalamin 1000 MCG tablet Take 1,000 mcg by mouth daily.     DULoxetine (CYMBALTA) 30 MG capsule Take 1 capsule (30 mg total) by mouth daily. 30 capsule 3   fluorometholone (FML) 0.1 % ophthalmic suspension SMARTSIG:1 Drop(s) In Eye(s) 2-4 Times Daily PRN     Lactulose 20 GM/30ML SOLN Take 30 mLs (20 g total) by mouth daily as needed (Constipation). 450 mL 1   nitroGLYCERIN (NITROSTAT) 0.4 MG SL tablet Place 1 tablet (0.4 mg total) under the tongue every 5 (five) minutes as needed for chest pain. 30 tablet 0   ondansetron (ZOFRAN) 4 MG tablet Take 1 tablet (4 mg total) by mouth every 8 (eight) hours as needed for nausea or vomiting. 90 tablet 1   promethazine (PHENERGAN) 25 MG tablet Take 25 mg by mouth every 6 (six) hours as needed for nausea or vomiting.     SYNTHROID 50 MCG tablet Take 50 mcg by mouth daily.     traMADol (ULTRAM) 50 MG tablet Take 50 mg by mouth every 6 (six) hours as needed (pain).     No facility-administered medications prior to visit.    Allergies  Allergen Reactions   Bactrim [Sulfamethoxazole-Trimethoprim] Anaphylaxis   Clarithromycin Anaphylaxis    REACTION: UNKNOWN REACTION   Codeine Nausea And Vomiting   Erythromycin Anaphylaxis   Sulfa  Antibiotics Anaphylaxis   Trimethoprim     Review of Systems  Constitutional:  Negative for chills and fever.  HENT:  Positive for mouth sores. Negative for congestion and sore throat.   Eyes:  Negative for pain and discharge.  Respiratory:  Negative for cough and shortness of breath.   Cardiovascular:  Negative for chest pain and palpitations.  Gastrointestinal:  Positive for constipation. Negative for diarrhea, nausea and vomiting.  Endocrine: Negative for polydipsia and polyuria.  Genitourinary:  Negative for dysuria and hematuria.  Musculoskeletal:  Positive for arthralgias, back pain and gait problem. Negative for neck pain and neck stiffness.  Skin:  Negative for rash.  Neurological:  Positive for numbness. Negative for dizziness, weakness and headaches.  Psychiatric/Behavioral:  Negative for agitation and behavioral problems.        Objective:    Physical Exam Vitals reviewed.  Constitutional:      General: He is not in acute distress.    Appearance: He is not diaphoretic.  HENT:     Head: Normocephalic and atraumatic.     Nose: Nose normal.     Mouth/Throat:     Mouth: Oral lesions (Lower lip mucosal ulceration and erythema) present.  Eyes:     General: No scleral icterus.    Extraocular Movements: Extraocular movements intact.  Cardiovascular:     Rate and Rhythm: Normal rate and regular rhythm.     Pulses: Normal pulses.     Heart sounds: Normal heart sounds. No murmur heard. Pulmonary:     Breath sounds: Normal breath sounds. No wheezing or rales.  Musculoskeletal:     Cervical back: Neck supple. No tenderness.     Right knee: Swelling present. Decreased range of motion.     Left knee: Swelling present. Decreased range of motion.     Right lower leg: No edema.     Left lower leg: No edema.  Skin:    General: Skin is warm and dry.     Findings: Bruising (B/l UE) present.  Neurological:     General: No focal deficit present.     Mental Status: He is alert  and oriented to  person, place, and time.     Sensory: Sensory deficit (B/l feet) present.     Motor: Weakness (B/l LE) present.     Gait: Gait abnormal.  Psychiatric:        Mood and Affect: Mood normal.        Behavior: Behavior normal.     BP 121/74   Pulse 86   Ht 6' (1.829 m)   Wt 168 lb 9.6 oz (76.5 kg)   SpO2 96%   BMI 22.87 kg/m  Wt Readings from Last 3 Encounters:  01/13/23 168 lb 9.6 oz (76.5 kg)  12/18/22 173 lb 6.4 oz (78.7 kg)  12/14/22 173 lb 3.2 oz (78.6 kg)        Assessment & Plan:   Problem List Items Addressed This Visit       Digestive   Stomatitis - Primary    Onset of oral lesions after 3 weeks of Cymbalta -unclear if allergic reaction versus incidental stomatitis Viscous lidocaine as needed for oral lesions Avoid spicy food If persistent, will discontinue Cymbalta      Relevant Medications   lidocaine (XYLOCAINE) 2 % solution     Nervous and Auditory   Idiopathic peripheral neuropathy    Had drowsiness with gabapentin 100 mg dose Recently started Cymbalta 30 mg ONCE DAILY - had excellent response, would prefer to continue for now as it is unclear if stomatitis is because of Cymbalta Advised to take Vit B12 1000 mcg QD Has been evaluated by neurology        Meds ordered this encounter  Medications   lidocaine (XYLOCAINE) 2 % solution    Sig: Use as directed 15 mLs in the mouth or throat as needed for mouth pain.    Dispense:  100 mL    Refill:  0     Costa Jha Concha Se, MD

## 2023-01-13 NOTE — Assessment & Plan Note (Signed)
Had drowsiness with gabapentin 100 mg dose Recently started Cymbalta 30 mg ONCE DAILY - had excellent response, would prefer to continue for now as it is unclear if stomatitis is because of Cymbalta Advised to take Vit B12 1000 mcg QD Has been evaluated by neurology

## 2023-01-13 NOTE — Therapy (Signed)
OUTPATIENT PHYSICAL THERAPY LOWER EXTREMITY Treatment    Patient Name: Jorge Bishop MRN: [id number] DOB:12-06-1935, 87 y.o., male Today's Date: 01/13/2023  END OF SESSION:   PT End of Session - 01/13/23 1254     Visit Number 6    Number of Visits 12    Date for PT Re-Evaluation 02/03/23    Authorization Type medicare    Progress Note Due on Visit 10    PT Start Time 1300    PT Stop Time 1340    PT Time Calculation (min) 40 min    Activity Tolerance Patient tolerated treatment well    Behavior During Therapy [facility] for tasks assessed/performed                   Past Medical History:  Diagnosis Date   Arteriosclerotic cardiovascular disease (ASCVD)    Coronary artery bypass graft surgery in 12/98; DES to the RCA SVG in 07/2002; restenosis of the saphenous vein graft stent and renal intervention in 2006; 70% first diagonal present; calf in 11/2007-total obstruction of the saphenous vein graft to the first marginal   Benign prostatic hypertrophy    Cerebrovascular disease    60% bilateral internal carotid artery stenosis in 2012   Coronary artery disease    4 stents   Diaphragmatic hernia 01/15/2021   Diverticulosis    DJD (degenerative joint disease)    Erectile dysfunction    GERD (gastroesophageal reflux disease)    History of blood transfusion    History of kidney stones    Hyperlipidemia    Lipid profile in 12/2009:104, 91, 36, 50   Hypertension    Kidney stone 09/08/2017   Migraine headache    Right bundle branch block    Right ureteral stone 03/08/2019   Shortness of breath    Thrombocytopenia (HCC)    platelets of 104 in 03/2009   Upper gastrointestinal hemorrhage 10/09    Helicobacter pylori positive; subsequent laparoscopic fundoplication-2009   Past Surgical History:  Procedure Laterality Date   CARDIAC CATHETERIZATION     4 cardiac stents   CATARACT EXTRACTION W/PHACO Right 07/25/2012   Procedure: CATARACT EXTRACTION PHACO AND INTRAOCULAR LENS PLACEMENT  (IOC);  Surgeon: Gemma Payor, MD;  Location: [facility];  Service: Ophthalmology;  Laterality: Right;  CDE:23.31   CATARACT EXTRACTION W/PHACO Left 08/18/2012   Procedure: CATARACT EXTRACTION PHACO AND INTRAOCULAR LENS PLACEMENT (IOC);  Surgeon: Gemma Payor, MD;  Location: [facility];  Service: Ophthalmology;  Laterality: Left;  CDE: 16.07   COLONOSCOPY N/A 08/01/2014   Procedure: COLONOSCOPY;  Surgeon: Malissa Hippo, MD;  Location: [facility];  Service: Endoscopy;  Laterality: N/A;  125 - moved to 10:30 - Ann to notify pt   CORONARY ARTERY BYPASS GRAFT  1998    CYSTOSCOPY/URETEROSCOPY/HOLMIUM LASER/STENT PLACEMENT Right 11/12/2017   Procedure: CYSTOSCOPY/RIGHT RETROGRADE PYELOGRAM/RIGHT URETEROSCOPY/HOLMIUM LASER LITHOTRIPSY RIGHT URETERAL CALCULUS/RIGHT URETERAL STENT PLACEMENT;  Surgeon: Bjorn Pippin, MD;  Location: [facility];  Service: Urology;  Laterality: Right;  right ureteral calculus given to patient's wife per Dr. Annabell Howells   CYSTOSCOPY/URETEROSCOPY/HOLMIUM LASER/STENT PLACEMENT Right 03/07/2019   Procedure: CYSTOSCOPY RIGHT RETROGRADE RIGHT URETEROSCOPY;  Surgeon: Bjorn Pippin, MD;  Location: [facility];  Service: Urology;  Laterality: Right;   EXTRACORPOREAL SHOCK WAVE LITHOTRIPSY Right 09/13/2017   Procedure: RIGHT EXTRACORPOREAL SHOCK WAVE LITHOTRIPSY (ESWL);  Surgeon: Crista Elliot, MD;  Location: [facility];  Service: Urology;  Laterality: Right;   HERNIA REPAIR     right inguinal   LAPAROSCOPIC  NISSEN FUNDOPLICATION  1997, 2009    revision in 2009 along with repair of paraesophagel hernia    PARAESOPHAGEAL HERNIA REPAIR     TRANSURETHRAL RESECTION OF PROSTATE N/A 03/07/2019   Procedure: TRANSURETHRAL RESECTION OF THE PROSTATE (TURP);  Surgeon: Bjorn Pippin, MD;  Location: [facility];  Service: Urology;  Laterality: N/A;   Patient Active Problem List   Diagnosis Date Noted   Aortic regurgitation 12/14/2022   Primary osteoarthritis of both knees 11/18/2022   Right inguinal hernia 08/12/2022   Chronic gout  involving toe of right foot without tophus 04/07/2022   Chronic constipation 04/07/2022   Senile purpura (HCC) 09/25/2021   MGUS (monoclonal gammopathy of unknown significance) 09/03/2021   Idiopathic peripheral neuropathy 05/21/2021   History of coronary artery bypass graft 01/15/2021   Asymptomatic carotid artery stenosis 01/15/2021   Bilateral pseudophakia 01/15/2021   Gastroesophageal reflux disease 01/15/2021   Osteopenia 01/15/2021   Right bundle branch block 01/15/2021   Skin cancer 01/15/2021   Spinal stenosis of lumbar region 01/15/2021   Vitamin D deficiency 01/15/2021   Hypothyroidism 01/15/2021   Dizziness 08/13/2020   Neck pain 01/30/2020   Osteoarthritis of spine with radiculopathy, lumbar region 01/30/2020   Benign localized prostatic hyperplasia with lower urinary tract symptoms (LUTS) 03/07/2019   Cervical myelopathy (HCC) 01/16/2019   Protein-calorie malnutrition, severe 09/11/2017   Hyperlipidemia    Cerebrovascular disease    Thrombocytopenia (HCC)    Erectile dysfunction    CAD (coronary artery disease)    Hypertension 01/16/2009    PCP: Trena Platt  REFERRING PROVIDER: Oliver Barre, MD  REFERRING DIAG: Impression: Bilateral mild to moderate knee arthritis    THERAPY DIAG:  Muscle weakness (generalized)  Chronic pain of right knee  Chronic pain of left knee  Rationale for Evaluation and Treatment: Rehabilitation  ONSET DATE: chronic but noted increased pain in the past 6 months    SUBJECTIVE STATEMENT:   Pt states that he has been mowing his lawn.  His knee pain is improving   Eval:  PT states that he has had pain in his knees for years but in the past 6 months he has noted significant increased pain.  He states at his age he does not want a TKR.  He is having injections and has had the first of three.  He is most concerned that he is unable to get up off the floor when falls.  He has no rhyme or reason of falling.    PERTINENT  HISTORY: Vertigo for 30 years, OA, Torn RTC in LT shoulder, back surgery  PAIN:  Are you having pain? no: Pain location: knee Pain description: aches Aggravating factors: activity Relieving factors: not sure but the injection seems to be helping  Pain varies when WB from a 5-8 PRECAUTIONS: Fall  WEIGHT BEARING RESTRICTIONS: No  FALLS:  Has patient fallen in last 6 months? Yes. Number of falls 3  LIVING ENVIRONMENT: Lives with: lives with their family and lives with their spouse Lives in: House/apartment Stairs: Yes: Internal: 10 steps; on right going up downstairs but does not need to go down  Has following equipment at home: Single point cane  OCCUPATION: retiered  PLOF: Independent  PATIENT GOALS: to be able to get up off the floor   NEXT MD VISIT: 12/29/2022  OBJECTIVE:   DIAGNOSTIC FINDINGS: Impression: Bilateral mild to moderate knee arthritis    PATIENT SURVEYS:  FOTO 57  COGNITION: Overall cognitive status: Within functional limits for tasks assessed  LOWER EXTREMITY ROM:WFL  LOWER EXTREMITY MMT:  MMT Right eval Left eval  Hip flexion 4+ 5  Hip extension 2 2+  Hip abduction 3+ 4  Hip adduction    Hip internal rotation    Hip external rotation    Knee flexion 3 3+  Knee extension 4+/5 5  Ankle dorsiflexion 5 5  Ankle plantarflexion    Ankle inversion    Ankle eversion     (Blank rows = not tested) FUNCTIONAL TESTS:  30 seconds chair stand test:  unable without UE assist :  with UE assist 9 x ; 7 x is poor 11 is average  2 minute walk test: 390 Ft  Single leg stance:  RT: 2"    , Lt:1"   TODAY'S TREATMENT:                                                                                                                              DATE: 01/13/23 Heel raise/squat combo x 15 Tandem on foam x 5 with each leg in front x 5  Marching x 15  Lunge forward then back Rt then Lt x 10 Steps x 3 RT  Single leg stance 4" each x 5  Sit to stand  elevated surface x 10 Slant board stretch 60" x 2  Body craft leg press 6 Pl x 10 Hamstring curl 5 Pl x 15  Nustep level 2 x 10 minutes  01/11/23 Heel rise x 15 Squat x 15 Marching x 15  Lunge forward then back Rt then Lt x 10  Tandem stance  Step up 4" x 10 Rt and LT Sit to stand x 5 Body craft leg press 5 Pl x 15 Hamstring curl 4 Pl x 15  Hip abduction green theraband x 10 Hip extension green theraband x 10   01/07/23 Partial Tandem stance 2x 30" Vector stance 5x 5" 1 HHA Eccentric STS (no HHA) to heel raise with min HHA 10x  Squat 2x 10 Bodycraft walkout retro and sidestep both direction 5RT each 3Pl (SBA/CGA for safety)  01/05/23 HR 1 x 20 Mini squat x 20 Standing hip abduction RTB at calves 2 x 10 bilateral Standing hip extension RTB at calves 2 x 10 bilateral Step up 6 inch 2 x 10 bilateral  SLS with vectors 5 x 5 second holds bilateral  Lunge 1 x 10 bilateral  Bodycraft  4 Pl leg press x 15  12/30/22 Heel raise x 10  Tandem stance 5x with Rt in front; 5 with LT in front  Rocker board x 2 minutes  Step up 4" step Rt x 10 , Lt x 10  Mini squat x 15 Marching x 10  Sit to stand x 10 Bodycraft  4 Pl leg press x 10  Cybex knee flexion 3Pl  x 10  Nustep level 2, hills  x 5 minutes    12/23/22 evaluation  12/23/22 Sit to stand x 10 Mini  squat x 10  PATIENT EDUCATION:  Education details: HEP Person educated: Patient Education method: Explanation Education comprehension: verbalized understanding  HOME EXERCISE PROGRAM: Access Code: [email or web address] URL: [email or web address] Date: 12/23/2022 Prepared by: Virgina Organ  Exercises - Supine Bridge  - 2 x daily - 7 x weekly - 1 sets - 10 reps - 5" hold - Mini Squat with Counter Support  - 2 x daily - 7 x weekly - 1 sets - 10 reps - 5" hold - Sit to Stand with Counter Support  - 2 x daily - 7 x weekly - 1 sets - 10 reps 12/30/22 - Tandem Stance with Support  - 2 x daily - 7 x weekly - 1 sets - 5 reps -  15" hold - Heel Raises with Counter Support  - 2 x daily - 7 x weekly - 1 sets - 10 reps - 5" hold - Supine Hamstring Stretch  - 2 x daily - 7 x weekly - 1 sets - 3 reps - 30" hold  7/23  - Standing Hip Abduction with Resistance at Ankles and Counter Support  - 1 x daily - 7 x weekly - 2 sets - 10 reps - Standing Hip Extension with Resistance at Ankles and Counter Support  - 1 x daily - 7 x weekly - 2 sets - 10 reps  ASSESSMENT:  CLINICAL IMPRESSION:Pt able to balance for 4 " on each LE at this point ,(evaluation Lt was 1" and Rt was 2"), increased wt with leg press.  Pt able to complete steps in a reciprocal manner today. Pt continues to have decreased strength, decreased balance and will continue to benefit from skilled Pt .     Pt improving in activity endurance as evident of 53 minute walk.   Therapist increased strengthening exercises with step ups, and cybex knee flexion.  Pt continues to have deficits in strength, decreased balance, and increased pain and will benefit from skilled PT.      Patient is a 87 y.o. male  who was seen today for physical therapy evaluation and treatment for B knee pain.  Evaluation demonstrates decreased strength, decreased balance, and increased pain.  The pt walks several miles with his wife each day but at times has to place a hand on her shoulder for balance, he is also unable to get off the floor by himself and would like to be able to do this as due to his balance and neuropathy he does fall.  Mr. Relyea will benefit from skilled PT to address these issues and get the pt to a point where he can get himself off of the floor.   OBJECTIVE IMPAIRMENTS: Abnormal gait, decreased strength, and pain.   ACTIVITY LIMITATIONS: carrying, lifting, squatting, and stairs  PARTICIPATION LIMITATIONS: shopping and community activity  PERSONAL FACTORS: 1-2 comorbidities: back surgery, peripheral neuropathy  are also affecting patient's functional outcome.   REHAB  POTENTIAL: Good  CLINICAL DECISION MAKING: Evolving/moderate complexity  EVALUATION COMPLEXITY: Moderate   GOALS: Goals reviewed with patient? No  SHORT TERM GOALS: Target date: 01/13/23 Pt to be I in HEP in order to increase his strength 1/2 grade to improve knee stability and decrease pain to no more than 6/10  Baseline: Goal status: met  2.  PT to be able to single leg stance on his LE for 10" to reduce risk of falls  Baseline:  Goal status: on-going   LONG TERM GOALS: Target date: 02/03/23  Pt to be I in an advanced  HEP in order to allow pt to be able to I get himself off the floor  Baseline:  Goal status: on-going  2.  Pt to be able to sls for 20" in order to reduce risk of falls.  Baseline:  Goal status: on-going  3.  PT pain to be no greater than a 4/10 Baseline:  Goal status: met     PLAN:  PT FREQUENCY: 2x/week  PT DURATION: 6 weeks  PLANNED INTERVENTIONS: Therapeutic exercises, Therapeutic activity, Neuromuscular re-education, Balance training, Gait training, Patient/Family education, Self Care, Joint mobilization, and Manual therapy  PLAN FOR NEXT SESSION: begin tandem stance, cybex hamstring, body craft hip extension, sit to stand, squats, progress as able.  Virgina Organ, PT CLT [phone]  01/13/2023, 1:41 PM  1:41 PM, 01/13/23

## 2023-01-13 NOTE — Patient Instructions (Signed)
Please apply Lidocaine for burning pain.  Please continue taking Cymbalta for now.

## 2023-01-20 ENCOUNTER — Ambulatory Visit (HOSPITAL_COMMUNITY): Payer: Medicare Other | Attending: Orthopedic Surgery

## 2023-01-20 ENCOUNTER — Ambulatory Visit (HOSPITAL_COMMUNITY)
Admission: RE | Admit: 2023-01-20 | Discharge: 2023-01-20 | Disposition: A | Discharge status: Home / Self Care | Payer: Medicare Other | Source: Ambulatory Visit | Attending: Physician Assistant | Admitting: Physician Assistant

## 2023-01-20 ENCOUNTER — Encounter (HOSPITAL_COMMUNITY): Payer: Self-pay

## 2023-01-20 DIAGNOSIS — M25562 Pain in left knee: Secondary | ICD-10-CM | POA: Diagnosis present

## 2023-01-20 DIAGNOSIS — R936 Abnormal findings on diagnostic imaging of limbs: Secondary | ICD-10-CM | POA: Insufficient documentation

## 2023-01-20 DIAGNOSIS — M6281 Muscle weakness (generalized): Secondary | ICD-10-CM | POA: Diagnosis present

## 2023-01-20 DIAGNOSIS — M25561 Pain in right knee: Secondary | ICD-10-CM | POA: Insufficient documentation

## 2023-01-20 DIAGNOSIS — R296 Repeated falls: Secondary | ICD-10-CM | POA: Diagnosis present

## 2023-01-20 DIAGNOSIS — G8929 Other chronic pain: Secondary | ICD-10-CM | POA: Diagnosis present

## 2023-01-20 NOTE — Therapy (Signed)
OUTPATIENT PHYSICAL THERAPY LOWER EXTREMITY Treatment    Patient Name: Jorge Bishop MRN: [id number] DOB:August 08, 1935, 87 y.o., male Today's Date: 01/20/2023  END OF SESSION:   PT End of Session - 01/20/23 1346     Visit Number 7    Number of Visits 12    Date for PT Re-Evaluation 02/03/23    Authorization Type medicare    Progress Note Due on Visit 10    PT Start Time 1348    PT Stop Time 1428    PT Time Calculation (min) 40 min    Activity Tolerance Patient tolerated treatment well    Behavior During Therapy [facility] for tasks assessed/performed                   Past Medical History:  Diagnosis Date   Arteriosclerotic cardiovascular disease (ASCVD)    Coronary artery bypass graft surgery in 12/98; DES to the RCA SVG in 07/2002; restenosis of the saphenous vein graft stent and renal intervention in 2006; 70% first diagonal present; calf in 11/2007-total obstruction of the saphenous vein graft to the first marginal   Benign prostatic hypertrophy    Cerebrovascular disease    60% bilateral internal carotid artery stenosis in 2012   Coronary artery disease    4 stents   Diaphragmatic hernia 01/15/2021   Diverticulosis    DJD (degenerative joint disease)    Erectile dysfunction    GERD (gastroesophageal reflux disease)    History of blood transfusion    History of kidney stones    Hyperlipidemia    Lipid profile in 12/2009:104, 91, 36, 50   Hypertension    Kidney stone 09/08/2017   Migraine headache    Right bundle branch block    Right ureteral stone 03/08/2019   Shortness of breath    Thrombocytopenia (HCC)    platelets of 104 in 03/2009   Upper gastrointestinal hemorrhage 10/09    Helicobacter pylori positive; subsequent laparoscopic fundoplication-2009   Past Surgical History:  Procedure Laterality Date   CARDIAC CATHETERIZATION     4 cardiac stents   CATARACT EXTRACTION W/PHACO Right 07/25/2012   Procedure: CATARACT EXTRACTION PHACO AND INTRAOCULAR LENS PLACEMENT  (IOC);  Surgeon: Gemma Payor, MD;  Location: [facility];  Service: Ophthalmology;  Laterality: Right;  CDE:23.31   CATARACT EXTRACTION W/PHACO Left 08/18/2012   Procedure: CATARACT EXTRACTION PHACO AND INTRAOCULAR LENS PLACEMENT (IOC);  Surgeon: Gemma Payor, MD;  Location: [facility];  Service: Ophthalmology;  Laterality: Left;  CDE: 16.07   COLONOSCOPY N/A 08/01/2014   Procedure: COLONOSCOPY;  Surgeon: Malissa Hippo, MD;  Location: [facility];  Service: Endoscopy;  Laterality: N/A;  125 - moved to 10:30 - Ann to notify pt   CORONARY ARTERY BYPASS GRAFT  1998    CYSTOSCOPY/URETEROSCOPY/HOLMIUM LASER/STENT PLACEMENT Right 11/12/2017   Procedure: CYSTOSCOPY/RIGHT RETROGRADE PYELOGRAM/RIGHT URETEROSCOPY/HOLMIUM LASER LITHOTRIPSY RIGHT URETERAL CALCULUS/RIGHT URETERAL STENT PLACEMENT;  Surgeon: Bjorn Pippin, MD;  Location: [facility];  Service: Urology;  Laterality: Right;  right ureteral calculus given to patient's wife per Dr. Annabell Howells   CYSTOSCOPY/URETEROSCOPY/HOLMIUM LASER/STENT PLACEMENT Right 03/07/2019   Procedure: CYSTOSCOPY RIGHT RETROGRADE RIGHT URETEROSCOPY;  Surgeon: Bjorn Pippin, MD;  Location: [facility];  Service: Urology;  Laterality: Right;   EXTRACORPOREAL SHOCK WAVE LITHOTRIPSY Right 09/13/2017   Procedure: RIGHT EXTRACORPOREAL SHOCK WAVE LITHOTRIPSY (ESWL);  Surgeon: Crista Elliot, MD;  Location: [facility];  Service: Urology;  Laterality: Right;   HERNIA REPAIR     right inguinal   LAPAROSCOPIC  NISSEN FUNDOPLICATION  1997, 2009    revision in 2009 along with repair of paraesophagel hernia    PARAESOPHAGEAL HERNIA REPAIR     TRANSURETHRAL RESECTION OF PROSTATE N/A 03/07/2019   Procedure: TRANSURETHRAL RESECTION OF THE PROSTATE (TURP);  Surgeon: Bjorn Pippin, MD;  Location: [facility];  Service: Urology;  Laterality: N/A;   Patient Active Problem List   Diagnosis Date Noted   Stomatitis 01/13/2023   Aortic regurgitation 12/14/2022   Primary osteoarthritis of both knees 11/18/2022   Right inguinal hernia  08/12/2022   Chronic gout involving toe of right foot without tophus 04/07/2022   Chronic constipation 04/07/2022   Senile purpura (HCC) 09/25/2021   MGUS (monoclonal gammopathy of unknown significance) 09/03/2021   Idiopathic peripheral neuropathy 05/21/2021   History of coronary artery bypass graft 01/15/2021   Asymptomatic carotid artery stenosis 01/15/2021   Bilateral pseudophakia 01/15/2021   Gastroesophageal reflux disease 01/15/2021   Osteopenia 01/15/2021   Right bundle branch block 01/15/2021   Skin cancer 01/15/2021   Spinal stenosis of lumbar region 01/15/2021   Vitamin D deficiency 01/15/2021   Hypothyroidism 01/15/2021   Dizziness 08/13/2020   Neck pain 01/30/2020   Osteoarthritis of spine with radiculopathy, lumbar region 01/30/2020   Benign localized prostatic hyperplasia with lower urinary tract symptoms (LUTS) 03/07/2019   Cervical myelopathy (HCC) 01/16/2019   Protein-calorie malnutrition, severe 09/11/2017   Hyperlipidemia    Cerebrovascular disease    Thrombocytopenia (HCC)    Erectile dysfunction    CAD (coronary artery disease)    Hypertension 01/16/2009    PCP: Trena Platt  REFERRING PROVIDER: Oliver Barre, MD  REFERRING DIAG: Impression: Bilateral mild to moderate knee arthritis    THERAPY DIAG:  Muscle weakness (generalized)  Chronic pain of right knee  Chronic pain of left knee  Repeated falls  Rationale for Evaluation and Treatment: Rehabilitation  ONSET DATE: chronic but noted increased pain in the past 6 months    SUBJECTIVE STATEMENT:   Pt stated he had a rough night, reports his Lt shoulder is hurting worse (pain scale 7/10 Lt shoulder).  Bil knee pain scale 2/10 today  Eval:  PT states that he has had pain in his knees for years but in the past 6 months he has noted significant increased pain.  He states at his age he does not want a TKR.  He is having injections and has had the first of three.  He is most concerned that he is  unable to get up off the floor when falls.  He has no rhyme or reason of falling.    PERTINENT HISTORY: Vertigo for 30 years, OA, Torn RTC in LT shoulder, back surgery  PAIN:  Are you having pain? no: Pain location: knee Pain description: aches Aggravating factors: activity Relieving factors: not sure but the injection seems to be helping  Pain varies when WB from a 5-8 PRECAUTIONS: Fall  WEIGHT BEARING RESTRICTIONS: No  FALLS:  Has patient fallen in last 6 months? Yes. Number of falls 3  LIVING ENVIRONMENT: Lives with: lives with their family and lives with their spouse Lives in: House/apartment Stairs: Yes: Internal: 10 steps; on right going up downstairs but does not need to go down  Has following equipment at home: Single point cane  OCCUPATION: retiered  PLOF: Independent  PATIENT GOALS: to be able to get up off the floor   NEXT MD VISIT: 12/29/2022  OBJECTIVE:   DIAGNOSTIC FINDINGS: Impression: Bilateral mild to moderate knee arthritis  PATIENT SURVEYS:  FOTO 57  COGNITION: Overall cognitive status: Within functional limits for tasks assessed       LOWER EXTREMITY ROM:WFL  LOWER EXTREMITY MMT:  MMT Right eval Left eval  Hip flexion 4+ 5  Hip extension 2 2+  Hip abduction 3+ 4  Hip adduction    Hip internal rotation    Hip external rotation    Knee flexion 3 3+  Knee extension 4+/5 5  Ankle dorsiflexion 5 5  Ankle plantarflexion    Ankle inversion    Ankle eversion     (Blank rows = not tested) FUNCTIONAL TESTS:  30 seconds chair stand test:  unable without UE assist :  with UE assist 9 x ; 7 x is poor 11 is average  2 minute walk test: 390 Ft  Single leg stance:  RT: 2"    , Lt:1"   TODAY'S TREATMENT:                                                                                                                              DATE: 01/20/23: Heel raise/squat combo x 15 Lunge forward then back Rt then Lt x 15 SLS 7" max BLE Vector stance  3x 5" Tandem stance on foam 3x 30" intermittent HHA required Bodycraft walkout 3 then 4Pl retro and sidestep 5RT Leg press 5Pl 2x 10 Hamstring curl 6Pl 2x 10   01/13/23 Heel raise/squat combo x 15 Tandem on foam x 5 with each leg in front x 5  Marching x 15  Lunge forward then back Rt then Lt x 10 Steps x 3 RT  Single leg stance 4" each x 5  Sit to stand elevated surface x 10 Slant board stretch 60" x 2  Body craft leg press 6 Pl x 10 Hamstring curl 5 Pl x 15  Nustep level 2 x 10 minutes  01/11/23 Heel rise x 15 Squat x 15 Marching x 15  Lunge forward then back Rt then Lt x 10  Tandem stance  Step up 4" x 10 Rt and LT Sit to stand x 5 Body craft leg press 5 Pl x 15 Hamstring curl 4 Pl x 15  Hip abduction green theraband x 10 Hip extension green theraband x 10   01/07/23 Partial Tandem stance 2x 30" Vector stance 5x 5" 1 HHA Eccentric STS (no HHA) to heel raise with min HHA 10x  Squat 2x 10 Bodycraft walkout retro and sidestep both direction 5RT each 3Pl (SBA/CGA for safety)  01/05/23 HR 1 x 20 Mini squat x 20 Standing hip abduction RTB at calves 2 x 10 bilateral Standing hip extension RTB at calves 2 x 10 bilateral Step up 6 inch 2 x 10 bilateral  SLS with vectors 5 x 5 second holds bilateral  Lunge 1 x 10 bilateral  Bodycraft  4 Pl leg press x 15  12/30/22 Heel raise x 10  Tandem stance 5x with Rt in front;  5 with LT in front  Rocker board x 2 minutes  Step up 4" step Rt x 10 , Lt x 10  Mini squat x 15 Marching x 10  Sit to stand x 10 Bodycraft  4 Pl leg press x 10  Cybex knee flexion 3Pl  x 10  Nustep level 2, hills  x 5 minutes    12/23/22 evaluation  12/23/22 Sit to stand x 10 Mini squat x 10  PATIENT EDUCATION:  Education details: HEP Person educated: Patient Education method: Explanation Education comprehension: verbalized understanding  HOME EXERCISE PROGRAM: Access Code: [email or web address] URL: [email or web address] Date:  12/23/2022 Prepared by: Virgina Organ  Exercises - Supine Bridge  - 2 x daily - 7 x weekly - 1 sets - 10 reps - 5" hold - Mini Squat with Counter Support  - 2 x daily - 7 x weekly - 1 sets - 10 reps - 5" hold - Sit to Stand with Counter Support  - 2 x daily - 7 x weekly - 1 sets - 10 reps 12/30/22 - Tandem Stance with Support  - 2 x daily - 7 x weekly - 1 sets - 5 reps - 15" hold - Heel Raises with Counter Support  - 2 x daily - 7 x weekly - 1 sets - 10 reps - 5" hold - Supine Hamstring Stretch  - 2 x daily - 7 x weekly - 1 sets - 3 reps - 30" hold  7/23  - Standing Hip Abduction with Resistance at Ankles and Counter Support  - 1 x daily - 7 x weekly - 2 sets - 10 reps - Standing Hip Extension with Resistance at Ankles and Counter Support  - 1 x daily - 7 x weekly - 2 sets - 10 reps  ASSESSMENT:  CLINICAL IMPRESSION:Pt .    Session focus with LE functional strengthening and balance training.  Pt making gains with ability to SLS 7" Bil LE today.  Increased weight with walkout training with min cueing to increase step length for gluteal strengthening.  No reports of pain, was limited by fatigue.      Patient is a 87 y.o. male  who was seen today for physical therapy evaluation and treatment for B knee pain.  Evaluation demonstrates decreased strength, decreased balance, and increased pain.  The pt walks several miles with his wife each day but at times has to place a hand on her shoulder for balance, he is also unable to get off the floor by himself and would like to be able to do this as due to his balance and neuropathy he does fall.  Jorge Bishop will benefit from skilled PT to address these issues and get the pt to a point where he can get himself off of the floor.   OBJECTIVE IMPAIRMENTS: Abnormal gait, decreased strength, and pain.   ACTIVITY LIMITATIONS: carrying, lifting, squatting, and stairs  PARTICIPATION LIMITATIONS: shopping and community activity  PERSONAL FACTORS: 1-2  comorbidities: back surgery, peripheral neuropathy  are also affecting patient's functional outcome.   REHAB POTENTIAL: Good  CLINICAL DECISION MAKING: Evolving/moderate complexity  EVALUATION COMPLEXITY: Moderate   GOALS: Goals reviewed with patient? No  SHORT TERM GOALS: Target date: 01/13/23 Pt to be I in HEP in order to increase his strength 1/2 grade to improve knee stability and decrease pain to no more than 6/10  Baseline: Goal status: met  2.  PT to be able to single leg stance on his LE for 10" to reduce  risk of falls  Baseline:  Goal status: on-going   LONG TERM GOALS: Target date: 02/03/23  Pt to be I in an advanced HEP in order to allow pt to be able to I get himself off the floor  Baseline:  Goal status: on-going  2.  Pt to be able to sls for 20" in order to reduce risk of falls.  Baseline:  Goal status: on-going  3.  PT pain to be no greater than a 4/10 Baseline:  Goal status: met     PLAN:  PT FREQUENCY: 2x/week  PT DURATION: 6 weeks  PLANNED INTERVENTIONS: Therapeutic exercises, Therapeutic activity, Neuromuscular re-education, Balance training, Gait training, Patient/Family education, Self Care, Joint mobilization, and Manual therapy  PLAN FOR NEXT SESSION: begin tandem stance, cybex hamstring, body craft hip extension, sit to stand, squats, progress as able.  Becky Sax, LPTA/CLT; CBIS [phone]  Juel Burrow, PTA 01/20/2023, 2:34 PM  01/20/2023, 2:34 PM  2:34 PM, 01/20/23

## 2023-01-22 ENCOUNTER — Ambulatory Visit (HOSPITAL_COMMUNITY): Payer: Medicare Other

## 2023-01-22 ENCOUNTER — Encounter (HOSPITAL_COMMUNITY): Payer: Self-pay

## 2023-01-22 DIAGNOSIS — G8929 Other chronic pain: Secondary | ICD-10-CM

## 2023-01-22 DIAGNOSIS — M6281 Muscle weakness (generalized): Secondary | ICD-10-CM

## 2023-01-22 DIAGNOSIS — R296 Repeated falls: Secondary | ICD-10-CM
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
## 2023-01-26 ENCOUNTER — Other Ambulatory Visit: Payer: Self-pay

## 2023-01-26 ENCOUNTER — Inpatient Hospital Stay: Payer: Medicare Other | Attending: Hematology

## 2023-01-26 DIAGNOSIS — D472 Monoclonal gammopathy: Secondary | ICD-10-CM | POA: Insufficient documentation

## 2023-01-26 DIAGNOSIS — D696 Thrombocytopenia, unspecified: Secondary | ICD-10-CM | POA: Diagnosis not present

## 2023-01-26 DIAGNOSIS — Z79899 Other long term (current) drug therapy: Secondary | ICD-10-CM | POA: Diagnosis not present

## 2023-01-26 DIAGNOSIS — E039 Hypothyroidism, unspecified: Secondary | ICD-10-CM

## 2023-01-26 DIAGNOSIS — G629 Polyneuropathy, unspecified: Secondary | ICD-10-CM | POA: Diagnosis not present

## 2023-01-26 LAB — CBC WITH DIFFERENTIAL/PLATELET
Abs Immature Granulocytes: 0.01 10*3/uL (ref 0.00–0.07)
Basophils Absolute: 0 10*3/uL (ref 0.0–0.1)
Basophils Relative: 1 %
Eosinophils Absolute: 0.2 10*3/uL (ref 0.0–0.5)
Eosinophils Relative: 3 %
HCT: 48.8 % (ref 39.0–52.0)
Hemoglobin: 15.6 g/dL (ref 13.0–17.0)
Immature Granulocytes: 0 %
Lymphocytes Relative: 28 %
Lymphs Abs: 1.5 10*3/uL (ref 0.7–4.0)
MCH: 29.9 pg (ref 26.0–34.0)
MCHC: 32 g/dL (ref 30.0–36.0)
MCV: 93.5 fL (ref 80.0–100.0)
Monocytes Absolute: 0.5 10*3/uL (ref 0.1–1.0)
Monocytes Relative: 9 %
Neutro Abs: 3.1 10*3/uL (ref 1.7–7.7)
Neutrophils Relative %: 59 %
Platelets: 112 10*3/uL — ABNORMAL LOW (ref 150–400)
RBC: 5.22 MIL/uL (ref 4.22–5.81)
RDW: 12.8 % (ref 11.5–15.5)
WBC: 5.3 10*3/uL (ref 4.0–10.5)
nRBC: 0 % (ref 0.0–0.2)

## 2023-01-26 LAB — T4, FREE: Free T4: 0.88 ng/dL (ref 0.61–1.12)

## 2023-01-26 LAB — COMPREHENSIVE METABOLIC PANEL
ALT: 28 U/L (ref 0–44)
AST: 27 U/L (ref 15–41)
Albumin: 4.2 g/dL (ref 3.5–5.0)
Alkaline Phosphatase: 67 U/L (ref 38–126)
Anion gap: 9 (ref 5–15)
BUN: 20 mg/dL (ref 8–23)
CO2: 25 mmol/L (ref 22–32)
Calcium: 9.4 mg/dL (ref 8.9–10.3)
Chloride: 100 mmol/L (ref 98–111)
Creatinine, Ser: 0.94 mg/dL (ref 0.61–1.24)
GFR, Estimated: >60 mL/min (ref >60)
Glucose, Bld: 106 mg/dL — ABNORMAL HIGH (ref 70–99)
Potassium: 4.7 mmol/L (ref 3.5–5.1)
Sodium: 134 mmol/L — ABNORMAL LOW (ref 135–145)
Total Bilirubin: 1.3 mg/dL — ABNORMAL HIGH (ref 0.3–1.2)
Total Protein: 7.4 g/dL (ref 6.5–8.1)

## 2023-01-26 LAB — LACTATE DEHYDROGENASE: LDH: 153 U/L (ref 98–192)

## 2023-01-26 LAB — HEPATITIS C ANTIBODY: HCV Ab: NONREACTIVE

## 2023-01-26 LAB — TSH: TSH: 2.794 u[IU]/mL (ref 0.350–4.500)

## 2023-01-27 ENCOUNTER — Ambulatory Visit (HOSPITAL_COMMUNITY): Payer: Medicare Other

## 2023-01-27 ENCOUNTER — Encounter (HOSPITAL_COMMUNITY): Payer: Self-pay

## 2023-01-27 DIAGNOSIS — G8929 Other chronic pain: Secondary | ICD-10-CM

## 2023-01-27 DIAGNOSIS — M6281 Muscle weakness (generalized): Secondary | ICD-10-CM

## 2023-01-27 DIAGNOSIS — R296 Repeated falls: Secondary | ICD-10-CM

## 2023-01-27 NOTE — Therapy (Signed)
OUTPATIENT PHYSICAL THERAPY LOWER EXTREMITY Treatment    Patient Name: CILLIAN VALLEE MRN: [id number] DOB:15-Jul-1935, 87 y.o., male Today's Date: 01/27/2023  END OF SESSION:   PT End of Session - 01/27/23 1338     Visit Number 9    Number of Visits 12    Date for PT Re-Evaluation 02/03/23    Authorization Type medicare    Progress Note Due on Visit 10    PT Start Time 1341    PT Stop Time 1430    PT Time Calculation (min) 49 min    Equipment Utilized During Treatment --   SBA during body craft retro and sidestep walkout for safety   Activity Tolerance Patient tolerated treatment well    Behavior During Therapy WFL for tasks assessed/performed                    Past Medical History:  Diagnosis Date   Arteriosclerotic cardiovascular disease (ASCVD)    Coronary artery bypass graft surgery in 12/98; DES to the RCA SVG in 07/2002; restenosis of the saphenous vein graft stent and renal intervention in 2006; 70% first diagonal present; calf in 11/2007-total obstruction of the saphenous vein graft to the first marginal   Benign prostatic hypertrophy    Cerebrovascular disease    60% bilateral internal carotid artery stenosis in 2012   Coronary artery disease    4 stents   Diaphragmatic hernia 01/15/2021   Diverticulosis    DJD (degenerative joint disease)    Erectile dysfunction    GERD (gastroesophageal reflux disease)    History of blood transfusion    History of kidney stones    Hyperlipidemia    Lipid profile in 12/2009:104, 91, 36, 50   Hypertension    Kidney stone 09/08/2017   Migraine headache    Right bundle branch block    Right ureteral stone 03/08/2019   Shortness of breath    Thrombocytopenia (HCC)    platelets of 104 in 03/2009   Upper gastrointestinal hemorrhage 10/09    Helicobacter pylori positive; subsequent laparoscopic fundoplication-2009   Past Surgical History:  Procedure Laterality Date   CARDIAC CATHETERIZATION     4 cardiac stents   CATARACT  EXTRACTION W/PHACO Right 07/25/2012   Procedure: CATARACT EXTRACTION PHACO AND INTRAOCULAR LENS PLACEMENT (IOC);  Surgeon: Gemma Payor, MD;  Location: [facility];  Service: Ophthalmology;  Laterality: Right;  CDE:23.31   CATARACT EXTRACTION W/PHACO Left 08/18/2012   Procedure: CATARACT EXTRACTION PHACO AND INTRAOCULAR LENS PLACEMENT (IOC);  Surgeon: Gemma Payor, MD;  Location: [facility];  Service: Ophthalmology;  Laterality: Left;  CDE: 16.07   COLONOSCOPY N/A 08/01/2014   Procedure: COLONOSCOPY;  Surgeon: Malissa Hippo, MD;  Location: [facility];  Service: Endoscopy;  Laterality: N/A;  125 - moved to 10:30 - Ann to notify pt   CORONARY ARTERY BYPASS GRAFT  1998    CYSTOSCOPY/URETEROSCOPY/HOLMIUM LASER/STENT PLACEMENT Right 11/12/2017   Procedure: CYSTOSCOPY/RIGHT RETROGRADE PYELOGRAM/RIGHT URETEROSCOPY/HOLMIUM LASER LITHOTRIPSY RIGHT URETERAL CALCULUS/RIGHT URETERAL STENT PLACEMENT;  Surgeon: Bjorn Pippin, MD;  Location: [facility];  Service: Urology;  Laterality: Right;  right ureteral calculus given to patient's wife per Dr. Annabell Howells   CYSTOSCOPY/URETEROSCOPY/HOLMIUM LASER/STENT PLACEMENT Right 03/07/2019   Procedure: CYSTOSCOPY RIGHT RETROGRADE RIGHT URETEROSCOPY;  Surgeon: Bjorn Pippin, MD;  Location: [facility];  Service: Urology;  Laterality: Right;   EXTRACORPOREAL SHOCK WAVE LITHOTRIPSY Right 09/13/2017   Procedure: RIGHT EXTRACORPOREAL SHOCK WAVE LITHOTRIPSY (ESWL);  Surgeon: Crista Elliot, MD;  Location: [facility];  Service: Urology;  Laterality: Right;   HERNIA REPAIR     right inguinal   LAPAROSCOPIC NISSEN FUNDOPLICATION  1997, 2009    revision in 2009 along with repair of paraesophagel hernia    PARAESOPHAGEAL HERNIA REPAIR     TRANSURETHRAL RESECTION OF PROSTATE N/A 03/07/2019   Procedure: TRANSURETHRAL RESECTION OF THE PROSTATE (TURP);  Surgeon: Bjorn Pippin, MD;  Location: [facility];  Service: Urology;  Laterality: N/A;   Patient Active Problem List   Diagnosis Date Noted   Stomatitis 01/13/2023    Aortic regurgitation 12/14/2022   Primary osteoarthritis of both knees 11/18/2022   Right inguinal hernia 08/12/2022   Chronic gout involving toe of right foot without tophus 04/07/2022   Chronic constipation 04/07/2022   Senile purpura (HCC) 09/25/2021   MGUS (monoclonal gammopathy of unknown significance) 09/03/2021   Idiopathic peripheral neuropathy 05/21/2021   History of coronary artery bypass graft 01/15/2021   Asymptomatic carotid artery stenosis 01/15/2021   Bilateral pseudophakia 01/15/2021   Gastroesophageal reflux disease 01/15/2021   Osteopenia 01/15/2021   Right bundle branch block 01/15/2021   Skin cancer 01/15/2021   Spinal stenosis of lumbar region 01/15/2021   Vitamin D deficiency 01/15/2021   Hypothyroidism 01/15/2021   Dizziness 08/13/2020   Neck pain 01/30/2020   Osteoarthritis of spine with radiculopathy, lumbar region 01/30/2020   Benign localized prostatic hyperplasia with lower urinary tract symptoms (LUTS) 03/07/2019   Cervical myelopathy (HCC) 01/16/2019   Protein-calorie malnutrition, severe 09/11/2017   Hyperlipidemia    Cerebrovascular disease    Thrombocytopenia (HCC)    Erectile dysfunction    CAD (coronary artery disease)    Hypertension 01/16/2009    PCP: Trena Platt  REFERRING PROVIDER: Oliver Barre, MD  REFERRING DIAG: Impression: Bilateral mild to moderate knee arthritis    THERAPY DIAG:  Muscle weakness (generalized)  Chronic pain of right knee  Chronic pain of left knee  Repeated falls  Rationale for Evaluation and Treatment: Rehabilitation  ONSET DATE: chronic but noted increased pain in the past 6 months    SUBJECTIVE STATEMENT:   Pt stated he feels wobbly today, no reports of pain or recent fall.  Has been working on balance at home.  Reports he noticed his sodium is low.  Eval:  PT states that he has had pain in his knees for years but in the past 6 months he has noted significant increased pain.  He states at his  age he does not want a TKR.  He is having injections and has had the first of three.  He is most concerned that he is unable to get up off the floor when falls.  He has no rhyme or reason of falling.    PERTINENT HISTORY: Vertigo for 30 years, OA, Torn RTC in LT shoulder, back surgery  PAIN:  Are you having pain? no: Pain location: knee Pain description: aches Aggravating factors: activity Relieving factors: not sure but the injection seems to be helping  Pain varies when WB from a 5-8 PRECAUTIONS: Fall  WEIGHT BEARING RESTRICTIONS: No  FALLS:  Has patient fallen in last 6 months? Yes. Number of falls 3  LIVING ENVIRONMENT: Lives with: lives with their family and lives with their spouse Lives in: House/apartment Stairs: Yes: Internal: 10 steps; on right going up downstairs but does not need to go down  Has following equipment at home: Single point cane  OCCUPATION: retiered  PLOF: Independent  PATIENT GOALS: to be able to get up off  the floor   NEXT MD VISIT: PRN  OBJECTIVE:   DIAGNOSTIC FINDINGS: Impression: Bilateral mild to moderate knee arthritis    PATIENT SURVEYS:  FOTO 57  COGNITION: Overall cognitive status: Within functional limits for tasks assessed       LOWER EXTREMITY ROM:WFL  LOWER EXTREMITY MMT:  MMT Right eval Left eval  Hip flexion 4+ 5  Hip extension 2 2+  Hip abduction 3+ 4  Hip adduction    Hip internal rotation    Hip external rotation    Knee flexion 3 3+  Knee extension 4+/5 5  Ankle dorsiflexion 5 5  Ankle plantarflexion    Ankle inversion    Ankle eversion     (Blank rows = not tested) FUNCTIONAL TESTS:  30 seconds chair stand test:  unable without UE assist :  with UE assist 9 x ; 7 x is poor 11 is average  2 minute walk test: 390 Ft  Single leg stance:  RT: 2"    , Lt:1"   TODAY'S TREATMENT:                                                                                                                               DATE: 01/27/23: Heel raise/squat combo x 15 Forward lunge alternating Rt then Lt 10x each Backward lunge alternating Rt then Lt 10x each Lateral lunge alternating 10x Bodycraft walkout 3 then 4Pl retro and sidestep 5RT Stairs reciprocal pattern 5RT 7in Vector stance 3x 5" SLS Lt 8", Rt 6"  01/22/23: Heel raise/squat combo x 15 Lunge forward then back Rt then Lt x 15 Tandem stance on foam 3x 30" intermittent HHA Wall arch 10x5" SLS 4" max Vector stance 3x 5" Bodycraft walkout 3 then 4Pl retro and sidestep 5RT  01/20/23: Heel raise/squat combo x 15 Lunge forward then back Rt then Lt x 15 SLS 7" max BLE Vector stance 3x 5" Tandem stance on foam 3x 30" intermittent HHA required Bodycraft walkout 3 then 4Pl retro and sidestep 5RT Leg press 5Pl 2x 10 Hamstring curl 6Pl 2x 10   01/13/23 Heel raise/squat combo x 15 Tandem on foam x 5 with each leg in front x 5  Marching x 15  Lunge forward then back Rt then Lt x 10 Steps x 3 RT  Single leg stance 4" each x 5  Sit to stand elevated surface x 10 Slant board stretch 60" x 2  Body craft leg press 6 Pl x 10 Hamstring curl 5 Pl x 15  Nustep level 2 x 10 minutes  01/11/23 Heel rise x 15 Squat x 15 Marching x 15  Lunge forward then back Rt then Lt x 10  Tandem stance  Step up 4" x 10 Rt and LT Sit to stand x 5 Body craft leg press 5 Pl x 15 Hamstring curl 4 Pl x 15  Hip abduction green theraband x 10 Hip extension green theraband x 10  01/07/23 Partial Tandem stance 2x 30" Vector stance 5x 5" 1 HHA Eccentric STS (no HHA) to heel raise with min HHA 10x  Squat 2x 10 Bodycraft walkout retro and sidestep both direction 5RT each 3Pl (SBA/CGA for safety)  01/05/23 HR 1 x 20 Mini squat x 20 Standing hip abduction RTB at calves 2 x 10 bilateral Standing hip extension RTB at calves 2 x 10 bilateral Step up 6 inch 2 x 10 bilateral  SLS with vectors 5 x 5 second holds bilateral  Lunge 1 x 10 bilateral  Bodycraft  4 Pl leg press  x 15  12/30/22 Heel raise x 10  Tandem stance 5x with Rt in front; 5 with LT in front  Rocker board x 2 minutes  Step up 4" step Rt x 10 , Lt x 10  Mini squat x 15 Marching x 10  Sit to stand x 10 Bodycraft  4 Pl leg press x 10  Cybex knee flexion 3Pl  x 10  Nustep level 2, hills  x 5 minutes    12/23/22 evaluation  12/23/22 Sit to stand x 10 Mini squat x 10  PATIENT EDUCATION:  Education details: HEP Person educated: Patient Education method: Explanation Education comprehension: verbalized understanding  HOME EXERCISE PROGRAM: Access Code: [email or web address] URL: [email or web address] Date: 12/23/2022 Prepared by: Virgina Organ  Exercises - Supine Bridge  - 2 x daily - 7 x weekly - 1 sets - 10 reps - 5" hold - Mini Squat with Counter Support  - 2 x daily - 7 x weekly - 1 sets - 10 reps - 5" hold - Sit to Stand with Counter Support  - 2 x daily - 7 x weekly - 1 sets - 10 reps 12/30/22 - Tandem Stance with Support  - 2 x daily - 7 x weekly - 1 sets - 5 reps - 15" hold - Heel Raises with Counter Support  - 2 x daily - 7 x weekly - 1 sets - 10 reps - 5" hold - Supine Hamstring Stretch  - 2 x daily - 7 x weekly - 1 sets - 3 reps - 30" hold  7/23  - Standing Hip Abduction with Resistance at Ankles and Counter Support  - 1 x daily - 7 x weekly - 2 sets - 10 reps - Standing Hip Extension with Resistance at Ankles and Counter Support  - 1 x daily - 7 x weekly - 2 sets - 10 reps  ASSESSMENT:  CLINICAL IMPRESSION:Pt .   Session focus with combination/alternating movements for functional strengthening and balance training.  Pt required intermittent HHA during alternating movements for stability.  Cueing for form and mechanics.  No reports of pain through session.       Patient is a 87 y.o. male  who was seen today for physical therapy evaluation and treatment for B knee pain.  Evaluation demonstrates decreased strength, decreased balance, and increased pain.  The pt walks  several miles with his wife each day but at times has to place a hand on her shoulder for balance, he is also unable to get off the floor by himself and would like to be able to do this as due to his balance and neuropathy he does fall.  Mr. Kochevar will benefit from skilled PT to address these issues and get the pt to a point where he can get himself off of the floor.   OBJECTIVE IMPAIRMENTS: Abnormal gait, decreased strength, and pain.   ACTIVITY LIMITATIONS: carrying, lifting,  squatting, and stairs  PARTICIPATION LIMITATIONS: shopping and community activity  PERSONAL FACTORS: 1-2 comorbidities: back surgery, peripheral neuropathy  are also affecting patient's functional outcome.   REHAB POTENTIAL: Good  CLINICAL DECISION MAKING: Evolving/moderate complexity  EVALUATION COMPLEXITY: Moderate   GOALS: Goals reviewed with patient? No  SHORT TERM GOALS: Target date: 01/13/23 Pt to be I in HEP in order to increase his strength 1/2 grade to improve knee stability and decrease pain to no more than 6/10  Baseline: Goal status: met  2.  PT to be able to single leg stance on his LE for 10" to reduce risk of falls  Baseline:  Goal status: on-going   LONG TERM GOALS: Target date: 02/03/23  Pt to be I in an advanced HEP in order to allow pt to be able to I get himself off the floor  Baseline:  Goal status: on-going  2.  Pt to be able to sls for 20" in order to reduce risk of falls.  Baseline:  Goal status: on-going  3.  PT pain to be no greater than a 4/10 Baseline:  Goal status: met     PLAN:  PT FREQUENCY: 2x/week  PT DURATION: 6 weeks  PLANNED INTERVENTIONS: Therapeutic exercises, Therapeutic activity, Neuromuscular re-education, Balance training, Gait training, Patient/Family education, Self Care, Joint mobilization, and Manual therapy  PLAN FOR NEXT SESSION: begin tandem stance, cybex hamstring, body craft hip extension, sit to stand, squats, progress as able.  Progress  note next session.    Becky Sax, LPTA/CLT; CBIS [phone]  Juel Burrow, PTA 01/27/2023, 2:38 PM  01/27/2023, 2:38 PM  2:38 PM, 01/27/23

## 2023-01-29 ENCOUNTER — Ambulatory Visit (HOSPITAL_COMMUNITY): Payer: Medicare Other

## 2023-01-29 ENCOUNTER — Encounter (HOSPITAL_COMMUNITY): Payer: Self-pay

## 2023-01-29 DIAGNOSIS — M6281 Muscle weakness (generalized): Secondary | ICD-10-CM | POA: Diagnosis not present

## 2023-01-29 DIAGNOSIS — R296 Repeated falls: Secondary | ICD-10-CM

## 2023-01-29 DIAGNOSIS — G8929 Other chronic pain: Secondary | ICD-10-CM

## 2023-01-29 NOTE — Therapy (Signed)
OUTPATIENT PHYSICAL THERAPY LOWER EXTREMITY Treatment /Discharge   Reporting Period 12/30/22 to 01/29/23  See note below for Objective Data and Assessment of Progress/Goals.  PHYSICAL THERAPY DISCHARGE SUMMARY  Visits from Start of Care: 10  Current functional level related to goals / functional outcomes: See below   Remaining deficits: Balance   Education / Equipment: HEP   Patient agrees to discharge. Patient goals were partially met. Patient is being discharged due to being pleased with the current functional level.   Patient Name: Jorge Bishop MRN: [id number] DOB:09-26-1935, 87 y.o., male Today's Date: 01/29/2023  END OF SESSION:   PT End of Session - 01/29/23 1255     Visit Number 10    Number of Visits 12    Date for PT Re-Evaluation 02/03/23    Authorization Type medicare    Progress Note Due on Visit 10    PT Start Time 1301    PT Stop Time 1343    PT Time Calculation (min) 42 min    Activity Tolerance Patient tolerated treatment well    Behavior During Therapy [facility] for tasks assessed/performed                    Past Medical History:  Diagnosis Date   Arteriosclerotic cardiovascular disease (ASCVD)    Coronary artery bypass graft surgery in 12/98; DES to the RCA SVG in 07/2002; restenosis of the saphenous vein graft stent and renal intervention in 2006; 70% first diagonal present; calf in 11/2007-total obstruction of the saphenous vein graft to the first marginal   Benign prostatic hypertrophy    Cerebrovascular disease    60% bilateral internal carotid artery stenosis in 2012   Coronary artery disease    4 stents   Diaphragmatic hernia 01/15/2021   Diverticulosis    DJD (degenerative joint disease)    Erectile dysfunction    GERD (gastroesophageal reflux disease)    History of blood transfusion    History of kidney stones    Hyperlipidemia    Lipid profile in 12/2009:104, 91, 36, 50   Hypertension    Kidney stone 09/08/2017   Migraine headache     Right bundle branch block    Right ureteral stone 03/08/2019   Shortness of breath    Thrombocytopenia (HCC)    platelets of 104 in 03/2009   Upper gastrointestinal hemorrhage 10/09    Helicobacter pylori positive; subsequent laparoscopic fundoplication-2009   Past Surgical History:  Procedure Laterality Date   CARDIAC CATHETERIZATION     4 cardiac stents   CATARACT EXTRACTION W/PHACO Right 07/25/2012   Procedure: CATARACT EXTRACTION PHACO AND INTRAOCULAR LENS PLACEMENT (IOC);  Surgeon: Gemma Payor, MD;  Location: [facility];  Service: Ophthalmology;  Laterality: Right;  CDE:23.31   CATARACT EXTRACTION W/PHACO Left 08/18/2012   Procedure: CATARACT EXTRACTION PHACO AND INTRAOCULAR LENS PLACEMENT (IOC);  Surgeon: Gemma Payor, MD;  Location: [facility];  Service: Ophthalmology;  Laterality: Left;  CDE: 16.07   COLONOSCOPY N/A 08/01/2014   Procedure: COLONOSCOPY;  Surgeon: Malissa Hippo, MD;  Location: [facility];  Service: Endoscopy;  Laterality: N/A;  125 - moved to 10:30 - Ann to notify pt   CORONARY ARTERY BYPASS GRAFT  1998    CYSTOSCOPY/URETEROSCOPY/HOLMIUM LASER/STENT PLACEMENT Right 11/12/2017   Procedure: CYSTOSCOPY/RIGHT RETROGRADE PYELOGRAM/RIGHT URETEROSCOPY/HOLMIUM LASER LITHOTRIPSY RIGHT URETERAL CALCULUS/RIGHT URETERAL STENT PLACEMENT;  Surgeon: Bjorn Pippin, MD;  Location: [facility];  Service: Urology;  Laterality: Right;  right ureteral calculus given to patient's wife per Dr.  Wrenn   CYSTOSCOPY/URETEROSCOPY/HOLMIUM LASER/STENT PLACEMENT Right 03/07/2019   Procedure: CYSTOSCOPY RIGHT RETROGRADE RIGHT URETEROSCOPY;  Surgeon: Bjorn Pippin, MD;  Location: [facility];  Service: Urology;  Laterality: Right;   EXTRACORPOREAL SHOCK WAVE LITHOTRIPSY Right 09/13/2017   Procedure: RIGHT EXTRACORPOREAL SHOCK WAVE LITHOTRIPSY (ESWL);  Surgeon: Crista Elliot, MD;  Location: [facility];  Service: Urology;  Laterality: Right;   HERNIA REPAIR     right inguinal   LAPAROSCOPIC NISSEN FUNDOPLICATION  1997, 2009     revision in 2009 along with repair of paraesophagel hernia    PARAESOPHAGEAL HERNIA REPAIR     TRANSURETHRAL RESECTION OF PROSTATE N/A 03/07/2019   Procedure: TRANSURETHRAL RESECTION OF THE PROSTATE (TURP);  Surgeon: Bjorn Pippin, MD;  Location: [facility];  Service: Urology;  Laterality: N/A;   Patient Active Problem List   Diagnosis Date Noted   Stomatitis 01/13/2023   Aortic regurgitation 12/14/2022   Primary osteoarthritis of both knees 11/18/2022   Right inguinal hernia 08/12/2022   Chronic gout involving toe of right foot without tophus 04/07/2022   Chronic constipation 04/07/2022   Senile purpura (HCC) 09/25/2021   MGUS (monoclonal gammopathy of unknown significance) 09/03/2021   Idiopathic peripheral neuropathy 05/21/2021   History of coronary artery bypass graft 01/15/2021   Asymptomatic carotid artery stenosis 01/15/2021   Bilateral pseudophakia 01/15/2021   Gastroesophageal reflux disease 01/15/2021   Osteopenia 01/15/2021   Right bundle branch block 01/15/2021   Skin cancer 01/15/2021   Spinal stenosis of lumbar region 01/15/2021   Vitamin D deficiency 01/15/2021   Hypothyroidism 01/15/2021   Dizziness 08/13/2020   Neck pain 01/30/2020   Osteoarthritis of spine with radiculopathy, lumbar region 01/30/2020   Benign localized prostatic hyperplasia with lower urinary tract symptoms (LUTS) 03/07/2019   Cervical myelopathy (HCC) 01/16/2019   Protein-calorie malnutrition, severe 09/11/2017   Hyperlipidemia    Cerebrovascular disease    Thrombocytopenia (HCC)    Erectile dysfunction    CAD (coronary artery disease)    Hypertension 01/16/2009    PCP: Trena Platt  REFERRING PROVIDER: Oliver Barre, MD  REFERRING DIAG: Impression: Bilateral mild to moderate knee arthritis    THERAPY DIAG:  Muscle weakness (generalized)  Chronic pain of right knee  Chronic pain of left knee  Repeated falls  Rationale for Evaluation and Treatment: Rehabilitation  ONSET DATE:  chronic but noted increased pain in the past 6 months    SUBJECTIVE STATEMENT:   Pt stated he feels wobbly today, no reports of pain or recent fall.  Has been working on balance at home.  Reports he noticed his sodium is low.  Eval:  PT states that he has had pain in his knees for years but in the past 6 months he has noted significant increased pain.  He states at his age he does not want a TKR.  He is having injections and has had the first of three.  He is most concerned that he is unable to get up off the floor when falls.  He has no rhyme or reason of falling.    PERTINENT HISTORY: Vertigo for 30 years, OA, Torn RTC in LT shoulder, back surgery  PAIN:  Are you having pain? no: Pain location: knee Pain description: aches Aggravating factors: activity Relieving factors: not sure but the injection seems to be helping  Pain varies when WB from a 5-8 PRECAUTIONS: Fall  WEIGHT BEARING RESTRICTIONS: No  FALLS:  Has patient fallen in last 6 months? Yes. Number of falls 3  LIVING ENVIRONMENT: Lives with: lives with their family and lives with their spouse Lives in: House/apartment Stairs: Yes: Internal: 10 steps; on right going up downstairs but does not need to go down  Has following equipment at home: Single point cane  OCCUPATION: retiered  PLOF: Independent  PATIENT GOALS: to be able to get up off the floor   NEXT MD VISIT: PRN  OBJECTIVE:   DIAGNOSTIC FINDINGS: Impression: Bilateral mild to moderate knee arthritis    PATIENT SURVEYS:  FOTO 57  01/29/23: 73%  COGNITION: Overall cognitive status: Within functional limits for tasks assessed       LOWER EXTREMITY ROM:WFL  LOWER EXTREMITY MMT:  MMT Right eval Left eval Right 01/29/23 Left  01/29/23  Hip flexion 4+ 5 4+ 5/5  Hip extension 2 2+ 3+ 3+  Hip abduction 3+ 4 4/5 4/5  Hip adduction      Hip internal rotation      Hip external rotation      Knee flexion 3 3+ 4+ 4+  Knee extension 4+/5 5 5 5   Ankle  dorsiflexion 5 5    Ankle plantarflexion      Ankle inversion      Ankle eversion       (Blank rows = not tested) FUNCTIONAL TESTS:  30 seconds chair stand test:  unable without UE assist :  with UE assist 9 x ; 7 x is poor 11 is average (01/29/23:  30 seconds chair stand test: able to complete 8 times without HHA) 2 minute walk test: 390 Ft (01/29/23 : 411ft no AD) Single leg stance:  RT: 2"    , Lt:1"; 01/29/23: Rt 11", Lt 9"   TODAY'S TREATMENT:                                                                                                                              DATE: 01/29/23: 465ft no AD 30 seconds chair stand test: able to complete 8 times without HHA MMT SLS Rt 11", Lt 9" max Vector stance 5x 5" intermittent HHA Squat to heel raise 15x Forward lunge alternating Rt then Lt 10x each Sidestep 2RT down hallway GTB   01/27/23: Heel raise/squat combo x 15 Forward lunge alternating Rt then Lt 10x each Backward lunge alternating Rt then Lt 10x each Lateral lunge alternating 10x Bodycraft walkout 3 then 4Pl retro and sidestep 5RT Stairs reciprocal pattern 5RT 7in Vector stance 3x 5" SLS Lt 8", Rt 6"  01/22/23: Heel raise/squat combo x 15 Lunge forward then back Rt then Lt x 15 Tandem stance on foam 3x 30" intermittent HHA Wall arch 10x5" SLS 4" max Vector stance 3x 5" Bodycraft walkout 3 then 4Pl retro and sidestep 5RT  01/20/23: Heel raise/squat combo x 15 Lunge forward then back Rt then Lt x 15 SLS 7" max BLE Vector stance 3x 5" Tandem stance on foam 3x 30" intermittent HHA required Bodycraft walkout 3 then 4Pl retro and  sidestep 5RT Leg press 5Pl 2x 10 Hamstring curl 6Pl 2x 10   01/13/23 Heel raise/squat combo x 15 Tandem on foam x 5 with each leg in front x 5  Marching x 15  Lunge forward then back Rt then Lt x 10 Steps x 3 RT  Single leg stance 4" each x 5  Sit to stand elevated surface x 10 Slant board stretch 60" x 2  Body craft leg press 6 Pl x  10 Hamstring curl 5 Pl x 15  Nustep level 2 x 10 minutes  01/11/23 Heel rise x 15 Squat x 15 Marching x 15  Lunge forward then back Rt then Lt x 10  Tandem stance  Step up 4" x 10 Rt and LT Sit to stand x 5 Body craft leg press 5 Pl x 15 Hamstring curl 4 Pl x 15  Hip abduction green theraband x 10 Hip extension green theraband x 10   01/07/23 Partial Tandem stance 2x 30" Vector stance 5x 5" 1 HHA Eccentric STS (no HHA) to heel raise with min HHA 10x  Squat 2x 10 Bodycraft walkout retro and sidestep both direction 5RT each 3Pl (SBA/CGA for safety)  01/05/23 HR 1 x 20 Mini squat x 20 Standing hip abduction RTB at calves 2 x 10 bilateral Standing hip extension RTB at calves 2 x 10 bilateral Step up 6 inch 2 x 10 bilateral  SLS with vectors 5 x 5 second holds bilateral  Lunge 1 x 10 bilateral  Bodycraft  4 Pl leg press x 15  12/30/22 Heel raise x 10  Tandem stance 5x with Rt in front; 5 with LT in front  Rocker board x 2 minutes  Step up 4" step Rt x 10 , Lt x 10  Mini squat x 15 Marching x 10  Sit to stand x 10 Bodycraft  4 Pl leg press x 10  Cybex knee flexion 3Pl  x 10  Nustep level 2, hills  x 5 minutes    12/23/22 evaluation  12/23/22 Sit to stand x 10 Mini squat x 10  PATIENT EDUCATION:  Education details: HEP Person educated: Patient Education method: Explanation Education comprehension: verbalized understanding  HOME EXERCISE PROGRAM: Access Code: [email or web address] URL: [email or web address] Date: 12/23/2022 Prepared by: Virgina Organ  Exercises - Supine Bridge  - 2 x daily - 7 x weekly - 1 sets - 10 reps - 5" hold - Mini Squat with Counter Support  - 2 x daily - 7 x weekly - 1 sets - 10 reps - 5" hold - Sit to Stand with Counter Support  - 2 x daily - 7 x weekly - 1 sets - 10 reps 12/30/22 - Tandem Stance with Support  - 2 x daily - 7 x weekly - 1 sets - 5 reps - 15" hold - Heel Raises with Counter Support  - 2 x daily - 7 x weekly - 1 sets -  10 reps - 5" hold - Supine Hamstring Stretch  - 2 x daily - 7 x weekly - 1 sets - 3 reps - 30" hold  7/23  - Standing Hip Abduction with Resistance at Ankles and Counter Support  - 1 x daily - 7 x weekly - 2 sets - 10 reps - Standing Hip Extension with Resistance at Ankles and Counter Support  - 1 x daily - 7 x weekly - 2 sets - 10 reps  01/29/23: - Standing 3-Way Kick  - 1 x daily - 7 x weekly -  3 sets - 10 reps - Squat with Chair Touch  - 1 x daily - 7 x weekly - 3 sets - 10 reps - Heel Toe Raises with Counter Support  - 1 x daily - 7 x weekly - 3 sets - 10 reps - Side Stepping with Resistance at Thighs  - 1 x daily - 7 x weekly - 3 sets - 10 reps  ASSESSMENT:  CLINICAL IMPRESSION:  Reviewed goals with the following findings:  Pt has met 1/2 STG and 2/3 LTGs.  Pt ambulates at faster cadence without AD and no LOB episodes.  Has improved strength for all hip musculature by 1 grade except for LT glut med, still at 4/5.  Pt reports ability to complete all functional activities with no reports of knee pain.  Improved self perceived functional abilities to 73% with FOTO survey, was 57% initial eval.  Pt continues to have difficulty with SLS though has made great improvements.  Reviewed importance of HEP with additional exercises added today.  Pt verbalized understanding.      Patient is a 87 y.o. male  who was seen today for physical therapy evaluation and treatment for B knee pain.  Evaluation demonstrates decreased strength, decreased balance, and increased pain.  The pt walks several miles with his wife each day but at times has to place a hand on her shoulder for balance, he is also unable to get off the floor by himself and would like to be able to do this as due to his balance and neuropathy he does fall.  Mr. Barresi will benefit from skilled PT to address these issues and get the pt to a point where he can get himself off of the floor.   OBJECTIVE IMPAIRMENTS: Abnormal gait, decreased  strength, and pain.   ACTIVITY LIMITATIONS: carrying, lifting, squatting, and stairs  PARTICIPATION LIMITATIONS: shopping and community activity  PERSONAL FACTORS: 1-2 comorbidities: back surgery, peripheral neuropathy  are also affecting patient's functional outcome.   REHAB POTENTIAL: Good  CLINICAL DECISION MAKING: Evolving/moderate complexity  EVALUATION COMPLEXITY: Moderate   GOALS: Goals reviewed with patient? No  SHORT TERM GOALS: Target date: 01/13/23 Pt to be I in HEP in order to increase his strength 1/2 grade to improve knee stability and decrease pain to no more than 6/10  Baseline:  01/29/23:  Reports compliance with HEP daily Goal status: met  2.  PT to be able to single leg stance on his LE for 10" to reduce risk of falls  Baseline: 01/29/23:  SLS Rt 11", Lt 9" Goal status: partially met   LONG TERM GOALS: Target date: 02/03/23  Pt to be I in an advanced HEP in order to allow pt to be able to I get himself off the floor  Baseline: 01/29/23:  Reports compliance with HEP daily Goal status: met  2.  Pt to be able to sls for 20" in order to reduce risk of falls.  Baseline: SLS Rt 11", Lt 9" Goal status: on-going  3.  PT pain to be no greater than a 4/10 Baseline: 01/29/23:  No reports of knee pain with any activity Goal status: met     PLAN:  PT FREQUENCY: 2x/week  PT DURATION: 6 weeks  PLANNED INTERVENTIONS: Therapeutic exercises, Therapeutic activity, Neuromuscular re-education, Balance training, Gait training, Patient/Family education, Self Care, Joint mobilization, and Manual therapy  PLAN FOR NEXT SESSION: DC to HEP  Becky Sax, LPTA/CLT; CBIS [phone] Virgina Organ, PT CLT [phone]  01/29/2023, 2:12 PM  2:12 PM, 01/29/23

## 2023-02-01 ENCOUNTER — Encounter (HOSPITAL_COMMUNITY): Payer: Medicare Other | Admitting: Physical Therapy

## 2023-02-01 NOTE — Progress Notes (Unsigned)
[facility] [address], [place] [address]   CLINIC:  Medical Oncology/Hematology  PCP:  Jorge Halon, MD [address] [place] [address] [phone]   REASON FOR VISIT:  Follow-up for MGUS  PRIOR THERAPY: None  CURRENT THERAPY: Active surveillance  INTERVAL HISTORY:   Jorge Bishop 87 y.o. male returns for routine follow-up of MGUS.  He was last seen by Jorge Brenner PA-C on 11/02/2022.  At today's visit, he reports feeling fairly well.  No recent hospitalizations, surgeries, or changes in baseline health status.  He continues to have chronic peripheral neuropathy and imbalance issues, but these have improved after being started on duloxetine and completing physical therapy and rehab.  He reports some ongoing joint pain, particularly in his left arm.  He denies any recent fractures.  He denies any B symptoms such as fever, chills, night sweats, unintentional weight loss.  He has some chronic tinnitus, hearing loss, and progressive blurry vision; denies any acute neurologic changes.  He denies any new masses or lymphadenopathy.  Regarding his thrombocytopenia, he reports that he has had low platelets since around 2009 when he was hospitalized for bleeding ulcer and H. pylori.  He reports that he bruises and bleeds easily. He reports that his gums will bleed when he "brushes hard."  He denies any major bleeding episodes - no epistaxis, gum bleeding, no hemoptysis, hematuria, no rectal bleeding or melena.  He has little to no energy and 50% appetite.  His weight has been stable in the same 5 pound range since his last visit.  ASSESSMENT & PLAN:  1.  IgG kappa MGUS - Worked up by neurology (Dr. Nita Bishop) for lower extremity neuropathy and worsening balance problems, which showed M spike positive - Hematology workup (09/03/2021): Immunofixation with IgG kappa monoclonal protein and SPEP 0.4. - Most recent skeletal survey (07/28/2022): 7 mm sclerotic  lesion in mid left humerus appears new from prior.  No other sclerotic or lytic osseous lesions are identified - sclerotic lesion addressed below - Most recent MGUS/myeloma panel (01/26/2023) M spike stable at 0.5 No CRAB features: Hemoglobin 15.6, creatinine 0.94, calcium 9.4.  LDH normal. - Continues to report chronic neuropathy, balance issues, tinnitus, hearing loss, and blurry vision. - He reports pain around his left hip, left shoulder, and right wrist.  (Skeletal survey negative for any lytic lesions at these locations.  Left humerus sclerotic lesion addressed below.) - Reports 30 pound weight loss over the past 2 years due to decreased appetite.  His weight has been stable in the past 3 months. - PLAN:  Next MGUS/myeloma panel due in 6 months (February 2025.   - Full body skeletal survey due February 2025, will request attention to sclerotic lesion of left humerus  2.  Thrombocytopenia - Patient reports that he has had low platelets since around 2009, when he was hospitalized for bleeding ulcer and H. pylori. - Hematology workup: Normal LDH.  Negative ANA and rheumatoid factor. Normal B12, MMA, folate, copper. Hepatitis B and hepatitis C negative. Immature platelet fraction normal at 6.2% - Reports easy bruising and bleeding.  He reports that his gums will bleed when he "brushes hard."  He has some pinkish discoloration to his sputum in the morning; no frank epistaxis or hemoptysis. - CT abdomen/pelvis (09/04/2022) showed normal spleen and liver.  No lymphadenopathy. - Most recent CBC/D (01/26/2023): Platelets 112, stable. - No masses or lymphadenopathy on exam - Differential diagnosis includes mild chronic ITP versus  early MDS  - PLAN: Continue monitoring of platelets. - Patient is taking aspirin 81 mg daily, was recently prescribed duloxetine for his neuropathy.  Advised caution due to increased risk of bleeding when taking both aspirin and duloxetine, especially in light of mild  thrombocytopenia. - We would consider bone marrow biopsy if development of any B symptoms or major deviation from baseline.    3.  Sclerotic lesion of left humerus - Skeletal survey (07/28/2022): 7 mm sclerotic lesion in mid left humerus appears new from prior.  No other sclerotic or lytic osseous lesions are identified. - Follow-up imaging (10/26/2022) with x-ray of left humerus: Somewhat faint sclerotic lesion in left humeral shaft unchanged from 07/27/2022 - Most recent humerus x-ray (01/20/2023): Stable small sclerotic lesion in the left mid humeral shaft which has nonaggressive features and does not have the typical appearance of a sclerotic bone metastasis or concerning primary bone lesion. - PSA was normal at 1.20 on 10/26/2022.  (PSA 2.56 in 2015) -- CT A/P (09/04/2022): No abnormal lymphadenopathy or evidence of malignancy or metastatic disease - PLAN: No further oncology workup at this time.  Attention to this region on skeletal survey in 6 months. - Since he does have some left shoulder pain and tenderness, recommend that he [facility] with [facility] to receive [facility] to orthopedic specialist.  4.  Peripheral neuropathy - Lower extremity neuropathy for the past several years - Previously took gabapentin, but self-discontinued this due to gait instability  5.  Other history - Lives at home with his wife.  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  He is retired from [employer].  He was exposed to agent orange in [place].  He is a non-smoker.   PLAN SUMMARY: >> Labs in 6 months = CBC/D, CMP, LDH, SPEP, light chains >> Skeletal survey in 6 months  >> OFFICE visit in 6 months (1 week after labs)     REVIEW OF SYSTEMS:  Review of Systems  Constitutional:  Positive for fatigue. Negative for appetite change, chills, diaphoresis, fever and unexpected weight change.  HENT:   Negative for lump/mass and nosebleeds.   Eyes:  Negative for eye problems.  Respiratory:  Negative  for cough, hemoptysis and shortness of breath.   Cardiovascular:  Positive for palpitations. Negative for chest pain and leg swelling.  Gastrointestinal:  Positive for constipation and diarrhea. Negative for abdominal pain, blood in stool, nausea and vomiting.  Genitourinary:  Negative for hematuria.   Musculoskeletal:  Positive for arthralgias (especially left arm/shoulder).  Skin: Negative.   Neurological:  Positive for dizziness, headaches and numbness. Negative for light-headedness.  Hematological:  Does not bruise/bleed easily.     PHYSICAL EXAM:  ECOG PERFORMANCE STATUS: 1 - Symptomatic but completely ambulatory  Vitals:   02/02/23 1321  BP: 126/80  Pulse: 74  Resp: 16  Temp: (!) 97.4 F (36.3 C)  SpO2: 100%   Filed Weights   02/02/23 1321  Weight: 170 lb 9.6 oz (77.4 kg)   Physical Exam Vitals reviewed.  Constitutional:      Appearance: Normal appearance.  Cardiovascular:     Rate and Rhythm: Regular rhythm.     Heart sounds: Normal heart sounds.  Pulmonary:     Breath sounds: Normal breath sounds.  Skin:    Findings: Bruising present.  Neurological:     Mental Status: He is alert.  Psychiatric:        Mood and Affect: Mood normal.  Behavior: Behavior normal.    PAST MEDICAL/SURGICAL HISTORY:  Past Medical History:  Diagnosis Date   Arteriosclerotic cardiovascular disease (ASCVD)    Coronary artery bypass graft surgery in 12/98; DES to the RCA SVG in 07/2002; restenosis of the saphenous vein graft stent and renal intervention in 2006; 70% first diagonal present; calf in 11/2007-total obstruction of the saphenous vein graft to the first marginal   Benign prostatic hypertrophy    Cerebrovascular disease    60% bilateral internal carotid artery stenosis in 2012   Coronary artery disease    4 stents   Diaphragmatic hernia 01/15/2021   Diverticulosis    DJD (degenerative joint disease)    Erectile dysfunction    GERD (gastroesophageal reflux disease)     History of blood transfusion    History of kidney stones    Hyperlipidemia    Lipid profile in 12/2009:104, 91, 36, 50   Hypertension    Kidney stone 09/08/2017   Migraine headache    Right bundle branch block    Right ureteral stone 03/08/2019   Shortness of breath    Thrombocytopenia (HCC)    platelets of 104 in 03/2009   Upper gastrointestinal hemorrhage 10/09    Helicobacter pylori positive; subsequent laparoscopic fundoplication-2009   Past Surgical History:  Procedure Laterality Date   CARDIAC CATHETERIZATION     4 cardiac stents   CATARACT EXTRACTION W/PHACO Right 07/25/2012   Procedure: CATARACT EXTRACTION PHACO AND INTRAOCULAR LENS PLACEMENT (IOC);  Surgeon: Gemma Payor, MD;  Location: [facility];  Service: Ophthalmology;  Laterality: Right;  CDE:23.31   CATARACT EXTRACTION W/PHACO Left 08/18/2012   Procedure: CATARACT EXTRACTION PHACO AND INTRAOCULAR LENS PLACEMENT (IOC);  Surgeon: Gemma Payor, MD;  Location: [facility];  Service: Ophthalmology;  Laterality: Left;  CDE: 16.07   COLONOSCOPY N/A 08/01/2014   Procedure: COLONOSCOPY;  Surgeon: Malissa Hippo, MD;  Location: [facility];  Service: Endoscopy;  Laterality: N/A;  125 - moved to 10:30 - Ann to notify pt   CORONARY ARTERY BYPASS GRAFT  1998    CYSTOSCOPY/URETEROSCOPY/HOLMIUM LASER/STENT PLACEMENT Right 11/12/2017   Procedure: CYSTOSCOPY/RIGHT RETROGRADE PYELOGRAM/RIGHT URETEROSCOPY/HOLMIUM LASER LITHOTRIPSY RIGHT URETERAL CALCULUS/RIGHT URETERAL STENT PLACEMENT;  Surgeon: Bjorn Pippin, MD;  Location: [facility];  Service: Urology;  Laterality: Right;  right ureteral calculus given to patient's wife per Dr. Annabell Howells   CYSTOSCOPY/URETEROSCOPY/HOLMIUM LASER/STENT PLACEMENT Right 03/07/2019   Procedure: CYSTOSCOPY RIGHT RETROGRADE RIGHT URETEROSCOPY;  Surgeon: Bjorn Pippin, MD;  Location: [facility];  Service: Urology;  Laterality: Right;   EXTRACORPOREAL SHOCK WAVE LITHOTRIPSY Right 09/13/2017   Procedure: RIGHT EXTRACORPOREAL SHOCK WAVE LITHOTRIPSY  (ESWL);  Surgeon: Crista Elliot, MD;  Location: [facility];  Service: Urology;  Laterality: Right;   HERNIA REPAIR     right inguinal   LAPAROSCOPIC NISSEN FUNDOPLICATION  1997, 2009    revision in 2009 along with repair of paraesophagel hernia    PARAESOPHAGEAL HERNIA REPAIR     TRANSURETHRAL RESECTION OF PROSTATE N/A 03/07/2019   Procedure: TRANSURETHRAL RESECTION OF THE PROSTATE (TURP);  Surgeon: Bjorn Pippin, MD;  Location: [facility];  Service: Urology;  Laterality: N/A;    SOCIAL HISTORY:  Social History   Socioeconomic History   Marital status: Married    Spouse name: Not on file   Number of children: Not on file   Years of education: Not on file   Highest education level: Not on file  Occupational History   Occupation: Full time    Employer: RETIRED  Tobacco Use  Smoking status: Never   Smokeless tobacco: Never  Vaping Use   Vaping status: Never Used  Substance and Sexual Activity   Alcohol use: No    Alcohol/week: 0.0 standard drinks of alcohol   Drug use: No   Sexual activity: Yes    Birth control/protection: None  Other Topics Concern   Not on file  Social History Narrative   Right handed   Drinks caffeine   Two story home   Social Determinants of Health   Financial Resource Strain: Low Risk  (02/17/2022)   Overall Financial Resource Strain (CARDIA)    Difficulty of Paying Living Expenses: Not hard at all  Food Insecurity: No Food Insecurity (02/17/2022)   Hunger Vital Sign    Worried About Running Out of Food in the Last Year: Never true    Ran Out of Food in the Last Year: Never true  Transportation Needs: No Transportation Needs (02/17/2022)   PRAPARE - Administrator, Civil Service (Medical): No    Lack of Transportation (Non-Medical): No  Physical Activity: Sufficiently Active (02/17/2022)   Exercise Vital Sign    Days of Exercise per Week: 5 days    Minutes of Exercise per Session: 60 min  Stress: No Stress Concern Present (02/17/2022)   [employer] of Occupational Health - Occupational Stress Questionnaire    Feeling of Stress : Not at all  Social Connections: Moderately Isolated (02/17/2022)   Social Connection and Isolation Panel [NHANES]    Frequency of Communication with Friends and Family: More than three times a week    Frequency of Social Gatherings with Friends and Family: More than three times a week    Attends Religious Services: Never    Database administrator or Organizations: No    Attends Banker Meetings: Never    Marital Status: Married  Catering manager Violence: Unknown (02/17/2022)   Humiliation, Afraid, Rape, and Kick questionnaire    Fear of Current or Ex-Partner: No    Emotionally Abused: Not on file    Physically Abused: No    Sexually Abused: No    FAMILY HISTORY:  Family History  Problem Relation Age of Onset   CAD Father     CURRENT MEDICATIONS:  Outpatient Encounter Medications as of 02/02/2023  Medication Sig   aspirin 81 MG tablet Take 81 mg by mouth daily.     atorvastatin (LIPITOR) 20 MG tablet Take 20 mg by mouth at bedtime.     carvedilol (COREG) 6.25 MG tablet Take 1 tablet (6.25 mg total) by mouth daily.   Cholecalciferol (VITAMIN D3) 50 MCG (2000 UT) TABS Take 2,000 Units by mouth daily.   cyanocobalamin 1000 MCG tablet Take 1,000 mcg by mouth daily.   DULoxetine (CYMBALTA) 30 MG capsule Take 1 capsule (30 mg total) by mouth daily.   fluorometholone (FML) 0.1 % ophthalmic suspension SMARTSIG:1 Drop(s) In Eye(s) 2-4 Times Daily PRN   Lactulose 20 GM/30ML SOLN Take 30 mLs (20 g total) by mouth daily as needed (Constipation).   lidocaine (XYLOCAINE) 2 % solution Use as directed 15 mLs in the mouth or throat as needed for mouth pain.   SYNTHROID 50 MCG tablet Take 50 mcg by mouth daily.   traMADol (ULTRAM) 50 MG tablet Take 50 mg by mouth every 6 (six) hours as needed (pain).   nitroGLYCERIN (NITROSTAT) 0.4 MG SL tablet Place 1 tablet (0.4 mg total) under the tongue every 5  (five) minutes as needed for chest pain. (  Patient not taking: Reported on 02/02/2023)   ondansetron (ZOFRAN) 4 MG tablet Take 1 tablet (4 mg total) by mouth every 8 (eight) hours as needed for nausea or vomiting. (Patient not taking: Reported on 02/02/2023)   promethazine (PHENERGAN) 25 MG tablet Take 25 mg by mouth every 6 (six) hours as needed for nausea or vomiting. (Patient not taking: Reported on 02/02/2023)   No facility-administered encounter medications on file as of 02/02/2023.    ALLERGIES:  Allergies  Allergen Reactions   Bactrim [Sulfamethoxazole-Trimethoprim] Anaphylaxis   Clarithromycin Anaphylaxis    REACTION: UNKNOWN REACTION   Codeine Nausea And Vomiting   Erythromycin Anaphylaxis   Sulfa Antibiotics Anaphylaxis   Trimethoprim     LABORATORY DATA:  I have reviewed the labs as listed.  CBC    Component Value Date/Time   WBC 5.3 01/26/2023 1431   RBC 5.22 01/26/2023 1431   HGB 15.6 01/26/2023 1431   HGB 15.2 09/15/2021 1135   HCT 48.8 01/26/2023 1431   HCT 46.4 09/15/2021 1135   PLT 112 (L) 01/26/2023 1431   PLT 139 (L) 09/15/2021 1135   MCV 93.5 01/26/2023 1431   MCV 89 09/15/2021 1135   MCH 29.9 01/26/2023 1431   MCHC 32.0 01/26/2023 1431   RDW 12.8 01/26/2023 1431   RDW 12.4 09/15/2021 1135   LYMPHSABS 1.5 01/26/2023 1431   LYMPHSABS 1.5 09/15/2021 1135   MONOABS 0.5 01/26/2023 1431   EOSABS 0.2 01/26/2023 1431   EOSABS 0.2 09/15/2021 1135   BASOSABS 0.0 01/26/2023 1431   BASOSABS 0.0 09/15/2021 1135      Latest Ref Rng & Units 01/26/2023    2:31 PM 07/27/2022    2:30 PM 04/07/2022    1:39 PM  CMP  Glucose 70 - 99 mg/dL 161  096  045   BUN 8 - 23 mg/dL 20  21  15    Creatinine 0.61 - 1.24 mg/dL 4.09  8.11  9.14   Sodium 135 - 145 mmol/L 134  138  141   Potassium 3.5 - 5.1 mmol/L 4.7  4.9  4.9   Chloride 98 - 111 mmol/L 100  104  103   CO2 22 - 32 mmol/L 25  28  24    Calcium 8.9 - 10.3 mg/dL 9.4  9.4  9.2   Total Protein 6.5 - 8.1 g/dL 7.4  7.2   6.9   Total Bilirubin 0.3 - 1.2 mg/dL 1.3  1.4  0.7   Alkaline Phos 38 - 126 U/L 67  62  80   AST 15 - 41 U/L 27  25  21    ALT 0 - 44 U/L 28  24  21      DIAGNOSTIC IMAGING:  I have independently reviewed the relevant imaging and discussed with the patient.   WRAP UP:  All questions were answered. The patient knows to call the clinic with any problems, questions or concerns.  Medical decision making: Moderate  Time spent on visit: I spent 25 minutes counseling the patient face to face. The total time spent in the appointment was 40 minutes and more than 50% was on counseling.  Carnella Guadalajara, PA-C  02/02/23 2:24 PM

## 2023-02-02 ENCOUNTER — Inpatient Hospital Stay (HOSPITAL_BASED_OUTPATIENT_CLINIC_OR_DEPARTMENT_OTHER): Payer: Medicare Other | Admitting: Physician Assistant

## 2023-02-02 VITALS — BP 126/80 | HR 74 | Temp 97.4°F | Resp 16 | Wt 170.6 lb

## 2023-02-02 DIAGNOSIS — D472 Monoclonal gammopathy: Secondary | ICD-10-CM

## 2023-02-02 DIAGNOSIS — R936 Abnormal findings on diagnostic imaging of limbs: Secondary | ICD-10-CM

## 2023-02-02 DIAGNOSIS — D696 Thrombocytopenia, unspecified: Secondary | ICD-10-CM

## 2023-02-02 NOTE — Patient Instructions (Signed)
[facility] **VISIT SUMMARY & IMPORTANT INSTRUCTIONS **   You were seen today by Rojelio Brenner PA-C for your follow-up visit.    MGUS As we discussed, MGUS is a precancerous condition where your body has abnormal plasma cells that make too many of a certain type of immunoglobulin. At this time, the elevation in your immunoglobulin is very mild and is not causing any issues. Will continue to keep watch on these labs every 6 months (next due in February 2025). You will be due for whole-body x-rays in 6 months (February 2025). The previously noted spot in your left arm bone (humerus) is stable and does not show any changes or features concerning for cancer.  LOW PLATELETS Your labs did not show any obvious cause of low platelets.  Your low platelets may be a sign of underlying bone marrow disorder or immune system dysfunction causing your body to attack its own platelets. Since your platelet levels are only mildly low, you do not need any invasive testing (i.e. bone marrow biopsy) or treatment at this time. We will continue to monitor closely.  FOLLOW-UP APPOINTMENT: Office visit in 6 months (1 week after labs and x-ray)  ** Thank you for trusting me with your healthcare!  I strive to provide all of my patients with quality care at each visit.  If you receive a survey for this visit, I would be so grateful to you for taking the time to provide feedback.  Thank you in advance!  ~ Rance Smithson                   Dr. Doreatha Massed   &   Rojelio Brenner, PA-C   - - - - - - - - - - - - - - - - - -    Thank you for choosing [facility] at [facility] to provide your oncology and hematology care.  To afford each patient quality time with our provider, please arrive at least 15 minutes before your scheduled appointment time.   If you have a lab appointment with the Cancer Center please come in thru the Main Entrance and check in at  the main information desk.  You need to re-schedule your appointment should you arrive 10 or more minutes late.  We strive to give you quality time with our providers, and arriving late affects you and other patients whose appointments are after yours.  Also, if you no show three or more times for appointments you may be dismissed from the clinic at the providers discretion.     Again, thank you for choosing [facility].  Our hope is that these requests will decrease the amount of time that you wait before being seen by our physicians.       _____________________________________________________________  Should you have questions after your visit to [facility], please contact our office at [phone] and follow the prompts.  Our office hours are 8:00 a.m. and 4:30 p.m. Monday - Friday.  Please note that voicemails left after 4:00 p.m. may not be returned until the following business day.  We are closed weekends and major holidays.  You do have access to a nurse 24-7, just call the main number to the clinic [phone] and do not press any options, hold on the line and a nurse will answer the phone.    For prescription refill requests, have your pharmacy contact our office and  allow 72 hours.

## 2023-02-03 ENCOUNTER — Encounter (HOSPITAL_COMMUNITY): Payer: Medicare Other

## 2023-02-08 ENCOUNTER — Encounter (HOSPITAL_COMMUNITY): Payer: Medicare Other | Admitting: Physical Therapy

## 2023-02-18 ENCOUNTER — Ambulatory Visit (INDEPENDENT_AMBULATORY_CARE_PROVIDER_SITE_OTHER): Payer: Medicare Other

## 2023-02-18 VITALS — Ht 72.0 in | Wt 169.0 lb

## 2023-02-18 DIAGNOSIS — Z0000 Encounter for general adult medical examination without abnormal findings: Secondary | ICD-10-CM | POA: Diagnosis not present

## 2023-02-18 NOTE — Progress Notes (Signed)
Because this visit was a virtual/telehealth visit,  certain criteria was not obtained, such a blood pressure, CBG if patient is a diabetic, and timed get up and go. Any medications not marked as "taking" was not mentioned during the medication reconciliation part of the visit. Any vitals not documented were not able to be obtained due to this being a telehealth visit. Vitals that have been documented are verbally provided by the patient.  Patient was unable to self-report a recent blood pressure reading due to a lack of equipment at home via telehealth.  Subjective:   Jorge Bishop is a 87 y.o. male who presents for Medicare Annual/Subsequent preventive examination.  Visit Complete: Virtual  I connected with  Hillery Jacks on 02/18/23 by a audio enabled telemedicine application and verified that I am speaking with the correct person using two identifiers.  Patient Location: Home  Provider Location: Home Office  I discussed the limitations of evaluation and management by telemedicine. The patient expressed understanding and agreed to proceed.  Patient Medicare AWV questionnaire was completed by the patient on 02/17/2023; I have confirmed that all information answered by patient is correct and no changes since this date.  Review of Systems     Cardiac Risk Factors include: advanced age (>62men, >56 women);dyslipidemia;hypertension;male gender     Objective:    Today's Vitals   02/18/23 1038 02/18/23 1039  Weight: 169 lb (76.7 kg)   Height: 6' (1.829 m)   PainSc:  0-No pain   Body mass index is 22.92 kg/m.     02/18/2023   10:42 AM 02/02/2023    1:20 PM 12/23/2022   11:15 AM 11/02/2022    1:08 PM 08/03/2022    2:11 PM 02/17/2022   10:48 AM 09/23/2021    1:42 PM  Advanced Directives  Does Patient Have a Medical Advance Directive? No Yes No Yes Yes Yes Yes  Type of Furniture conservator/restorer;Living will  Healthcare Power of [place];Living will Healthcare Power of  [place];Living will Healthcare Power of [place];Living will Healthcare Power of [place];Living will  Does patient want to make changes to medical advance directive?  No - Patient declined  No - Patient declined No - Patient declined No - Patient declined No - Patient declined  Copy of Healthcare Power of Attorney in Chart?  No - copy requested  No - copy requested No - copy requested Yes - validated most recent copy scanned in chart (See row information) No - copy requested  Would patient like information on creating a medical advance directive? No - Patient declined  No - Guardian declined        Current Medications (verified) Outpatient Encounter Medications as of 02/18/2023  Medication Sig   aspirin 81 MG tablet Take 81 mg by mouth daily.     atorvastatin (LIPITOR) 20 MG tablet Take 20 mg by mouth at bedtime.     carvedilol (COREG) 6.25 MG tablet Take 1 tablet (6.25 mg total) by mouth daily.   Cholecalciferol (VITAMIN D3) 50 MCG (2000 UT) TABS Take 2,000 Units by mouth daily.   cyanocobalamin 1000 MCG tablet Take 1,000 mcg by mouth daily.   DULoxetine (CYMBALTA) 30 MG capsule Take 1 capsule (30 mg total) by mouth daily.   fluorometholone (FML) 0.1 % ophthalmic suspension SMARTSIG:1 Drop(s) In Eye(s) 2-4 Times Daily PRN   Lactulose 20 GM/30ML SOLN Take 30 mLs (20 g total) by mouth daily as needed (Constipation).   lidocaine (XYLOCAINE) 2 % solution  Use as directed 15 mLs in the mouth or throat as needed for mouth pain.   nitroGLYCERIN (NITROSTAT) 0.4 MG SL tablet Place 1 tablet (0.4 mg total) under the tongue every 5 (five) minutes as needed for chest pain.   SYNTHROID 50 MCG tablet Take 50 mcg by mouth daily.   ondansetron (ZOFRAN) 4 MG tablet Take 1 tablet (4 mg total) by mouth every 8 (eight) hours as needed for nausea or vomiting. (Patient not taking: Reported on 02/02/2023)   promethazine (PHENERGAN) 25 MG tablet Take 25 mg by mouth every 6 (six) hours as needed for nausea or vomiting.  (Patient not taking: Reported on 02/02/2023)   traMADol (ULTRAM) 50 MG tablet Take 50 mg by mouth every 6 (six) hours as needed (pain). (Patient not taking: Reported on 02/18/2023)   No facility-administered encounter medications on file as of 02/18/2023.    Allergies (verified) Bactrim [sulfamethoxazole-trimethoprim], Clarithromycin, Codeine, Erythromycin, Sulfa antibiotics, and Trimethoprim   History: Past Medical History:  Diagnosis Date   Arteriosclerotic cardiovascular disease (ASCVD)    Coronary artery bypass graft surgery in 12/98; DES to the RCA SVG in 07/2002; restenosis of the saphenous vein graft stent and renal intervention in 2006; 70% first diagonal present; calf in 11/2007-total obstruction of the saphenous vein graft to the first marginal   Benign prostatic hypertrophy    Cerebrovascular disease    60% bilateral internal carotid artery stenosis in 2012   Coronary artery disease    4 stents   Diaphragmatic hernia 01/15/2021   Diverticulosis    DJD (degenerative joint disease)    Erectile dysfunction    GERD (gastroesophageal reflux disease)    History of blood transfusion    History of kidney stones    Hyperlipidemia    Lipid profile in 12/2009:104, 91, 36, 50   Hypertension    Kidney stone 09/08/2017   Migraine headache    Right bundle branch block    Right ureteral stone 03/08/2019   Shortness of breath    Thrombocytopenia (HCC)    platelets of 104 in 03/2009   Upper gastrointestinal hemorrhage 10/09    Helicobacter pylori positive; subsequent laparoscopic fundoplication-2009   Past Surgical History:  Procedure Laterality Date   CARDIAC CATHETERIZATION     4 cardiac stents   CATARACT EXTRACTION W/PHACO Right 07/25/2012   Procedure: CATARACT EXTRACTION PHACO AND INTRAOCULAR LENS PLACEMENT (IOC);  Surgeon: Gemma Payor, MD;  Location: [facility];  Service: Ophthalmology;  Laterality: Right;  CDE:23.31   CATARACT EXTRACTION W/PHACO Left 08/18/2012   Procedure: CATARACT  EXTRACTION PHACO AND INTRAOCULAR LENS PLACEMENT (IOC);  Surgeon: Gemma Payor, MD;  Location: [facility];  Service: Ophthalmology;  Laterality: Left;  CDE: 16.07   COLONOSCOPY N/A 08/01/2014   Procedure: COLONOSCOPY;  Surgeon: Malissa Hippo, MD;  Location: [facility];  Service: Endoscopy;  Laterality: N/A;  125 - moved to 10:30 - Ann to notify pt   CORONARY ARTERY BYPASS GRAFT  1998    CYSTOSCOPY/URETEROSCOPY/HOLMIUM LASER/STENT PLACEMENT Right 11/12/2017   Procedure: CYSTOSCOPY/RIGHT RETROGRADE PYELOGRAM/RIGHT URETEROSCOPY/HOLMIUM LASER LITHOTRIPSY RIGHT URETERAL CALCULUS/RIGHT URETERAL STENT PLACEMENT;  Surgeon: Bjorn Pippin, MD;  Location: [facility];  Service: Urology;  Laterality: Right;  right ureteral calculus given to patient's wife per Dr. Annabell Howells   CYSTOSCOPY/URETEROSCOPY/HOLMIUM LASER/STENT PLACEMENT Right 03/07/2019   Procedure: CYSTOSCOPY RIGHT RETROGRADE RIGHT URETEROSCOPY;  Surgeon: Bjorn Pippin, MD;  Location: [facility];  Service: Urology;  Laterality: Right;   EXTRACORPOREAL SHOCK WAVE LITHOTRIPSY Right 09/13/2017   Procedure: RIGHT EXTRACORPOREAL SHOCK  WAVE LITHOTRIPSY (ESWL);  Surgeon: Crista Elliot, MD;  Location: [facility];  Service: Urology;  Laterality: Right;   HERNIA REPAIR     right inguinal   LAPAROSCOPIC NISSEN FUNDOPLICATION  1997, 2009    revision in 2009 along with repair of paraesophagel hernia    PARAESOPHAGEAL HERNIA REPAIR     TRANSURETHRAL RESECTION OF PROSTATE N/A 03/07/2019   Procedure: TRANSURETHRAL RESECTION OF THE PROSTATE (TURP);  Surgeon: Bjorn Pippin, MD;  Location: [facility];  Service: Urology;  Laterality: N/A;   Family History  Problem Relation Age of Onset   CAD Father    Social History   Socioeconomic History   Marital status: Married    Spouse name: Not on file   Number of children: Not on file   Years of education: Not on file   Highest education level: Not on file  Occupational History   Occupation: Full time    Employer: RETIRED  Tobacco Use    Smoking status: Never   Smokeless tobacco: Never  Vaping Use   Vaping status: Never Used  Substance and Sexual Activity   Alcohol use: No    Alcohol/week: 0.0 standard drinks of alcohol   Drug use: No   Sexual activity: Yes    Birth control/protection: None  Other Topics Concern   Not on file  Social History Narrative   Right handed   Drinks caffeine   Two story home   Social Determinants of Health   Financial Resource Strain: Low Risk  (02/17/2023)   Overall Financial Resource Strain (CARDIA)    Difficulty of Paying Living Expenses: Not hard at all  Food Insecurity: No Food Insecurity (02/17/2023)   Hunger Vital Sign    Worried About Running Out of Food in the Last Year: Never true    Ran Out of Food in the Last Year: Never true  Transportation Needs: No Transportation Needs (02/17/2023)   PRAPARE - Administrator, Civil Service (Medical): No    Lack of Transportation (Non-Medical): No  Physical Activity: Sufficiently Active (02/17/2023)   Exercise Vital Sign    Days of Exercise per Week: 3 days    Minutes of Exercise per Session: 50 min  Stress: No Stress Concern Present (02/17/2023)   [employer] of Occupational Health - Occupational Stress Questionnaire    Feeling of Stress : Not at all  Social Connections: Unknown (02/17/2023)   Social Connection and Isolation Panel [NHANES]    Frequency of Communication with Friends and Family: Three times a week    Frequency of Social Gatherings with Friends and Family: Once a week    Attends Religious Services: Never    Diplomatic Services operational officer: Patient declined    Attends Engineer, structural: Patient declined    Marital Status: Married    Tobacco Counseling Counseling given: Yes   Clinical Intake:  Pre-visit preparation completed: Yes  Pain : No/denies pain Pain Score: 0-No pain     BMI - recorded: 22.92 Nutritional Status: BMI of 19-24  Normal Nutritional Risks: None Diabetes:  No  How often do you need to have someone help you when you read instructions, pamphlets, or other written materials from your doctor or pharmacy?: 1 - Never  Interpreter Needed?: No  Information entered by :: Abby Simcha Speir, CMA   Activities of Daily Living    02/17/2023   12:51 PM  In your present state of health, do you have any difficulty performing the following activities:  Hearing? 0  Vision? 0  Difficulty concentrating or making decisions? 0  Walking or climbing stairs? 0  Dressing or bathing? 0  Doing errands, shopping? 0  Preparing Food and eating ? N  Using the Toilet? N  In the past six months, have you accidently leaked urine? Y  Do you have problems with loss of bowel control? N  Managing your Medications? N  Managing your Finances? N  Housekeeping or managing your Housekeeping? N    Patient Care Team: Anabel Halon, MD as PCP - General (Internal Medicine) Marjo Bicker, MD as PCP - Cardiology (Cardiology) Doreatha Massed, MD as Medical Oncologist (Hematology)  Indicate any recent Medical Services you may have received from other than [facility] providers in the past year (date may be approximate).     Assessment:   This is a routine wellness examination for Sho.  Hearing/Vision screen Hearing Screening - Comments:: Patient wears hearing aids due to hearing difficulties.  He doesn't wear them all of the time.  Vision Screening - Comments:: Wears rx glasses - up to date with routine eye exams with Dr. Daphine Deutscher w/ [facility] in [place]   Goals Addressed             This Visit's Progress    Patient Stated       To get vertigo under control so he can be more stable on his feet.        Depression Screen    02/18/2023   10:44 AM 01/13/2023    2:09 PM 11/18/2022    2:53 PM 08/12/2022    1:30 PM 04/07/2022    1:04 PM 02/17/2022   10:49 AM 02/17/2022   10:48 AM  PHQ 2/9 Scores  PHQ - 2 Score 0 0 0 0 0 0 0    Fall Risk    02/17/2023   12:51 PM  01/13/2023    2:09 PM 11/18/2022    2:53 PM 08/12/2022    1:30 PM 04/07/2022    1:03 PM  Fall Risk   Falls in the past year? 1 0 0 1 0  Number falls in past yr: 1 0 0 1 0  Injury with Fall? 1 0 0 0 0  Risk for fall due to : History of fall(s);Impaired balance/gait;Orthopedic patient;Impaired mobility    No Fall Risks  Follow up Education provided;Falls prevention discussed    Falls evaluation completed    MEDICARE RISK AT HOME: Medicare Risk at Home Any stairs in or around the home?: Yes If so, are there any without handrails?: No Home free of loose throw rugs in walkways, pet beds, electrical cords, etc?: Yes Adequate lighting in your home to reduce risk of falls?: Yes Life alert?: No Use of a cane, walker or w/c?: Yes Grab bars in the bathroom?: Yes Shower chair or bench in shower?: No Elevated toilet seat or a handicapped toilet?: Yes  TIMED UP AND GO:  Was the test performed?  No    Cognitive Function:    02/16/2021   11:17 AM  MMSE - Mini Mental State Exam  Not completed: Unable to complete        02/18/2023   10:44 AM 02/17/2022   10:50 AM 02/16/2021   11:18 AM  6CIT Screen  What Year? 0 points 0 points 0 points  What month? 0 points 0 points 0 points  What time? 0 points 0 points 0 points  Count back from 20 0 points 0 points 0 points  Months in reverse 0 points 0 points 0 points  Repeat phrase 0 points 0 points 0 points  Total Score 0 points 0 points 0 points    Immunizations Immunization History  Administered Date(s) Administered   Fluad Quad(high Dose 65+) 03/08/2019, 02/27/2021, 03/09/2022   Influenza Split 02/14/2016   Influenza, High Dose Seasonal PF 03/15/2014, 03/15/2018   Influenza-Unspecified 03/08/2019, 03/06/2020   Moderna Covid-19 Vaccine Bivalent Booster 57yrs & up 04/03/2021, 03/31/2022   Moderna SARS-COV2 Booster Vaccination 10/17/2020, 11/06/2021   Moderna Sars-Covid-2 Vaccination 07/10/2019, 08/08/2019, 04/17/2020, 10/17/2020   Pneumococcal  Conjugate-13 10/18/2015   Pneumococcal Polysaccharide-23 02/18/2017   RSV,unspecified 07/31/2022   Td (Adult) 08/28/2014   Tdap 08/28/2014   Zoster Recombinant(Shingrix) 02/18/2017, 04/27/2017    TDAP status: Up to date  Flu Vaccine status: Due, Education has been provided regarding the importance of this vaccine. Advised may receive this vaccine at local pharmacy or Health Dept. Aware to provide a copy of the vaccination record if obtained from local pharmacy or Health Dept. Verbalized acceptance and understanding.  Pneumococcal vaccine status: Up to date  Covid-19 vaccine status: Information provided on how to obtain vaccines.   Qualifies for Shingles Vaccine? No   Zostavax completed No   Shingrix Completed?: Yes  Screening Tests Health Maintenance  Topic Date Due   Diabetic kidney evaluation - Urine ACR  Never done   COVID-19 Vaccine (7 - 2023-24 season) 02/14/2023   Medicare Annual Wellness (AWV)  02/18/2023   INFLUENZA VACCINE  03/16/2023 (Originally 01/14/2023)   Diabetic kidney evaluation - eGFR measurement  01/26/2024   DTaP/Tdap/Td (3 - Td or Tdap) 08/27/2024   Pneumonia Vaccine 7+ Years old  Completed   Zoster Vaccines- Shingrix  Completed   HPV VACCINES  Aged Out   FOOT EXAM  Discontinued   HEMOGLOBIN A1C  Discontinued   OPHTHALMOLOGY EXAM  Discontinued    Health Maintenance  Health Maintenance Due  Topic Date Due   Diabetic kidney evaluation - Urine ACR  Never done   COVID-19 Vaccine (7 - 2023-24 season) 02/14/2023   Medicare Annual Wellness (AWV)  02/18/2023    Colorectal cancer screening: No longer required.   Lung Cancer Screening: (Low Dose CT Chest recommended if Age 11-80 years, 20 pack-year currently smoking OR have quit w/in 15years.) does not qualify.   Lung Cancer Screening Referral: na  Additional Screening:  Hepatitis C Screening: does not qualify;    Vision Screening: Recommended annual ophthalmology exams for early detection of glaucoma  and other disorders of the eye. Is the patient up to date with their annual eye exam?  Yes  Who is the provider or what is the name of the office in which the patient attends annual eye exams? Dr. Daphine Deutscher w/ [facility] If pt is not established with a provider, would they like to be referred to a provider to establish care? No .   Dental Screening: Recommended annual dental exams for proper oral hygiene  Diabetic Foot Exam: na  Community Resource Referral / Chronic Care Management: CRR required this visit?  No   CCM required this visit?  No     Plan:     I have personally reviewed and noted the following in the patient's chart:   Medical and social history Use of alcohol, tobacco or illicit drugs  Current medications and supplements including opioid prescriptions. Patient is not currently taking opioid prescriptions. Functional ability and status Nutritional status Physical activity Advanced directives List of other physicians Hospitalizations, surgeries,  and ER visits in previous 12 months Vitals Screenings to include cognitive, depression, and falls Referrals and appointments  In addition, I have reviewed and discussed with patient certain preventive protocols, quality metrics, and best practice recommendations. A written personalized care plan for preventive services as well as general preventive health recommendations were provided to patient.     Jordan Hawks Aerilynn Goin, CMA   02/18/2023   After Visit Summary: (MyChart) Due to this being a telephonic visit, the after visit summary with patients personalized plan was offered to patient via MyChart   Nurse Notes:

## 2023-02-18 NOTE — Patient Instructions (Signed)
Jorge Bishop , Thank you for taking time to come for your Medicare Wellness Visit. I appreciate your ongoing commitment to your health goals. Please review the following plan we discussed and let me know if I can assist you in the future.   Referrals/Orders/Follow-Ups/Clinician Recommendations:   This is a list of the screening recommended for you and due dates:  Health Maintenance  Topic Date Due   Yearly kidney health urinalysis for diabetes  Never done   COVID-19 Vaccine (7 - 2023-24 season) 02/14/2023   Flu Shot  03/16/2023*   Yearly kidney function blood test for diabetes  01/26/2024   Medicare Annual Wellness Visit  02/18/2024   DTaP/Tdap/Td vaccine (3 - Td or Tdap) 08/27/2024   Pneumonia Vaccine  Completed   Zoster (Shingles) Vaccine  Completed   HPV Vaccine  Aged Out   Complete foot exam   Discontinued   Hemoglobin A1C  Discontinued   Eye exam for diabetics  Discontinued  *Topic was postponed. The date shown is not the original due date.    Advanced directives: (Declined) Advance directive discussed with you today. Even though you declined this today, please call our office should you change your mind, and we can give you the proper paperwork for you to fill out.  Next Medicare Annual Wellness Visit scheduled for next year: Yes  Preventive Care 48 Years and Older, Male Preventive care refers to lifestyle choices and visits with your health care provider that can promote health and wellness. Preventive care visits are also called wellness exams. What can I expect for my preventive care visit? Counseling During your preventive care visit, your health care provider may ask about your: Medical history, including: Past medical problems. Family medical history. History of falls. Current health, including: Emotional well-being. Home life and relationship well-being. Sexual activity. Memory and ability to understand (cognition). Lifestyle, including: Alcohol, nicotine or  tobacco, and drug use. Access to firearms. Diet, exercise, and sleep habits. Work and work Astronomer. Sunscreen use. Safety issues such as seatbelt and bike helmet use. Physical exam Your health care provider will check your: Height and weight. These may be used to calculate your BMI (body mass index). BMI is a measurement that tells if you are at a healthy weight. Waist circumference. This measures the distance around your waistline. This measurement also tells if you are at a healthy weight and may help predict your risk of certain diseases, such as type 2 diabetes and high blood pressure. Heart rate and blood pressure. Body temperature. Skin for abnormal spots. What immunizations do I need?  Vaccines are usually given at various ages, according to a schedule. Your health care provider will recommend vaccines for you based on your age, medical history, and lifestyle or other factors, such as travel or where you work. What tests do I need? Screening Your health care provider may recommend screening tests for certain conditions. This may include: Lipid and cholesterol levels. Diabetes screening. This is done by checking your blood sugar (glucose) after you have not eaten for a while (fasting). Hepatitis C test. Hepatitis B test. HIV (human immunodeficiency virus) test. STI (sexually transmitted infection) testing, if you are at risk. Lung cancer screening. Colorectal cancer screening. Prostate cancer screening. Abdominal aortic aneurysm (AAA) screening. You may need this if you are a current or former smoker. Talk with your health care provider about your test results, treatment options, and if necessary, the need for more tests. Follow these instructions at home: Eating and drinking  Eat a diet that includes fresh fruits and vegetables, whole grains, lean protein, and low-fat dairy products. Limit your intake of foods with high amounts of sugar, saturated fats, and salt. Take  vitamin and mineral supplements as recommended by your health care provider. Do not drink alcohol if your health care provider tells you not to drink. If you drink alcohol: Limit how much you have to 0-2 drinks a day. Know how much alcohol is in your drink. In the [place] one drink equals one 12 oz bottle of beer (355 mL), one 5 oz glass of wine (148 mL), or one 1 oz glass of hard liquor (44 mL). Lifestyle Brush your teeth every morning and night with fluoride toothpaste. Floss one time each day. Exercise for at least 30 minutes 5 or more days each week. Do not use any products that contain nicotine or tobacco. These products include cigarettes, chewing tobacco, and vaping devices, such as e-cigarettes. If you need help quitting, ask your health care provider. Do not use drugs. If you are sexually active, practice safe sex. Use a condom or other form of protection to prevent STIs. Take aspirin only as told by your health care provider. Make sure that you understand how much to take and what form to take. Work with your health care provider to find out whether it is safe and beneficial for you to take aspirin daily. Ask your health care provider if you need to take a cholesterol-lowering medicine (statin). Find healthy ways to manage stress, such as: Meditation, yoga, or listening to music. Journaling. Talking to a trusted person. Spending time with friends and family. Safety Always wear your seat belt while driving or riding in a vehicle. Do not drive: If you have been drinking alcohol. Do not ride with someone who has been drinking. When you are tired or distracted. While texting. If you have been using any mind-altering substances or drugs. Wear a helmet and other protective equipment during sports activities. If you have firearms in your house, make sure you follow all gun safety procedures. Minimize exposure to UV radiation to reduce your risk of skin cancer. What's next? Visit your  health care provider once a year for an annual wellness visit. Ask your health care provider how often you should have your eyes and teeth checked. Stay up to date on all vaccines. This information is not intended to replace advice given to you by your health care provider. Make sure you discuss any questions you have with your health care provider. Document Revised: 11/27/2020 Document Reviewed: 11/27/2020 Elsevier Patient Education  2024 [employer]. Understanding Your Risk for Falls Millions of people have serious injuries from falls each year. It is important to understand your risk of falling. Talk with your health care provider about your risk and what you can do to lower it. If you do have a serious fall, make sure to tell your provider. Falling once raises your risk of falling again. How can falls affect me? Serious injuries from falls are common. These include: Broken bones, such as hip fractures. Head injuries, such as traumatic brain injuries (TBI) or concussions. A fear of falling can cause you to avoid activities and stay at home. This can make your muscles weaker and raise your risk for a fall. What can increase my risk? There are a number of risk factors that increase your risk for falling. The more risk factors you have, the higher your risk of falling. Serious injuries from a fall happen  most often to people who are older than 85 years old. Teenagers and young adults ages 84-29 are also at higher risk. Common risk factors include: Weakness in the lower body. Being generally weak or confused due to long-term (chronic) illness. Dizziness or balance problems. Poor vision. Medicines that cause dizziness or drowsiness. These may include: Medicines for your blood pressure, heart, anxiety, insomnia, or swelling (edema). Pain medicines. Muscle relaxants. Other risk factors include: Drinking alcohol. Having had a fall in the past. Having foot pain or wearing improper  footwear. Working at a dangerous job. Having any of the following in your home: Tripping hazards, such as floor clutter or loose rugs. Poor lighting. Pets. Having dementia or memory loss. What actions can I take to lower my risk of falling?     Physical activity Stay physically fit. Do strength and balance exercises. Consider taking a regular class to build strength and balance. Yoga and tai chi are good options. Vision Have your eyes checked every year and your prescription for glasses or contacts updated as needed. Shoes and walking aids Wear non-skid shoes. Wear shoes that have rubber soles and low heels. Do not wear high heels. Do not walk around the house in socks or slippers. Use a cane or walker as told by your provider. Home safety Attach secure railings on both sides of your stairs. Install grab bars for your bathtub, shower, and toilet. Use a non-skid mat in your bathtub or shower. Attach bath mats securely with double-sided, non-slip rug tape. Use good lighting in all rooms. Keep a flashlight near your bed. Make sure there is a clear path from your bed to the bathroom. Use night-lights. Do not use throw rugs. Make sure all carpeting is taped or tacked down securely. Remove all clutter from walkways and stairways, including extension cords. Repair uneven or broken steps and floors. Avoid walking on icy or slippery surfaces. Walk on the grass instead of on icy or slick sidewalks. Use ice melter to get rid of ice on walkways in the winter. Use a cordless phone. Questions to ask your health care provider Can you help me check my risk for a fall? Do any of my medicines make me more likely to fall? Should I take a vitamin D supplement? What exercises can I do to improve my strength and balance? Should I make an appointment to have my vision checked? Do I need a bone density test to check for weak bones (osteoporosis)? Would it help to use a cane or a walker? Where to find  more information Centers for Disease Control and Prevention, [employer]: TonerPromos.no Community-Based Fall Prevention Programs: TonerPromos.no [employer] on Aging: BaseRingTones.pl Contact a health care provider if: You fall at home. You are afraid of falling at home. You feel weak, drowsy, or dizzy. This information is not intended to replace advice given to you by your health care provider. Make sure you discuss any questions you have with your health care provider. Document Revised: 02/02/2022 Document Reviewed: 02/02/2022 Elsevier Patient Education  2024 [employer].

## 2023-03-22 ENCOUNTER — Encounter: Payer: Self-pay | Admitting: Internal Medicine

## 2023-03-22 ENCOUNTER — Ambulatory Visit (INDEPENDENT_AMBULATORY_CARE_PROVIDER_SITE_OTHER): Payer: Medicare Other | Admitting: Internal Medicine

## 2023-03-22 VITALS — BP 113/64 | HR 81 | Ht 72.0 in | Wt 168.8 lb

## 2023-03-22 DIAGNOSIS — I25118 Atherosclerotic heart disease of native coronary artery with other forms of angina pectoris: Secondary | ICD-10-CM | POA: Diagnosis not present

## 2023-03-22 DIAGNOSIS — I10 Essential (primary) hypertension: Secondary | ICD-10-CM | POA: Diagnosis not present

## 2023-03-22 DIAGNOSIS — K5909 Other constipation: Secondary | ICD-10-CM

## 2023-03-22 DIAGNOSIS — N401 Enlarged prostate with lower urinary tract symptoms: Secondary | ICD-10-CM | POA: Diagnosis not present

## 2023-03-22 DIAGNOSIS — D472 Monoclonal gammopathy: Secondary | ICD-10-CM

## 2023-03-22 DIAGNOSIS — G609 Hereditary and idiopathic neuropathy, unspecified: Secondary | ICD-10-CM | POA: Diagnosis not present

## 2023-03-22 DIAGNOSIS — M170 Bilateral primary osteoarthritis of knee: Secondary | ICD-10-CM

## 2023-03-22 DIAGNOSIS — E782 Mixed hyperlipidemia: Secondary | ICD-10-CM

## 2023-03-22 DIAGNOSIS — E039 Hypothyroidism, unspecified: Secondary | ICD-10-CM

## 2023-03-22 DIAGNOSIS — R110 Nausea: Secondary | ICD-10-CM

## 2023-03-22 MED ORDER — ATORVASTATIN CALCIUM 20 MG PO TABS
20.0000 mg | ORAL_TABLET | Freq: Every day | ORAL | 3 refills | Status: DC
Start: 2023-03-22 — End: 2024-02-15

## 2023-03-22 MED ORDER — ONDANSETRON HCL 4 MG PO TABS: 4.0000 mg | ORAL_TABLET | Freq: Three times a day (TID) | ORAL | 0 refills | Status: DC | PRN

## 2023-03-22 MED ORDER — LACTULOSE 20 GM/30ML PO SOLN
30.0000 mL | Freq: Every day | ORAL | 3 refills | Status: DC | PRN
Start: 2023-03-22 — End: 2024-04-20

## 2023-03-22 MED ORDER — TRAMADOL HCL 50 MG PO TABS: 50.0000 mg | ORAL_TABLET | Freq: Two times a day (BID) | ORAL | 0 refills | Status: DC | PRN

## 2023-03-22 MED ORDER — LEVOTHYROXINE SODIUM 50 MCG PO TABS
50.0000 ug | ORAL_TABLET | Freq: Every day | ORAL | 1 refills | Status: DC
Start: 2023-03-22 — End: 2023-08-13

## 2023-03-22 MED ORDER — PROMETHAZINE HCL 25 MG PO TABS
25.0000 mg | ORAL_TABLET | Freq: Four times a day (QID) | ORAL | 0 refills | Status: DC | PRN
Start: 2023-03-22 — End: 2024-03-22

## 2023-03-22 NOTE — Assessment & Plan Note (Addendum)
Followed by oncology Recently bone scan showed left humeral sclerotic lesion, plan to get repeat bone scan in 02/25

## 2023-03-22 NOTE — Assessment & Plan Note (Addendum)
Had drowsiness with gabapentin 100 mg dose Recently started Cymbalta 30 mg QD - had excellent response, but had stomatitis and lip swelling, discontinue Cymbalta Advised to take Vit B12 1000 mcg QD Has been evaluated by neurology

## 2023-03-22 NOTE — Assessment & Plan Note (Addendum)
CABG in 12/98; DES to the RCA SVG in 07/2002; restenosis SVG stent and re-intervention in 2006; 70% residual D1; cath in 11/2007: TO SVG M1  Followed by Cardiology On Aspirin, statin and Coreg Has NTG PRN, does not have any anginal symptoms currently

## 2023-03-22 NOTE — Assessment & Plan Note (Addendum)
Has had prostate surgery, followed by Urology Was on Tamsulosin in the past Was given Myrbetriq for urge incontinence by Urology, but was not able to get it

## 2023-03-22 NOTE — Assessment & Plan Note (Signed)
Chronic idiopathic constipation Has tried multiple OTC laxatives and stool softeners without much relief Also takes prunes Linzess was not covered, now better with Lactulose Advised to maintain adequate hydration

## 2023-03-22 NOTE — Assessment & Plan Note (Addendum)
On statin Check lipid profile in the next visit

## 2023-03-22 NOTE — Patient Instructions (Addendum)
Please stop taking Duloxetine.  Please continue to take medications as prescribed.  Please continue to follow low salt diet and perform moderate exercise/walking as tolerated.

## 2023-03-22 NOTE — Assessment & Plan Note (Addendum)
Tylenol PRN Avoid oral NSAIDs due to CAD Tramadol as needed for severe pain - refilled Referred to Orthopedic surgery

## 2023-03-22 NOTE — Assessment & Plan Note (Signed)
On Levothyroxine 50 mcg QD ?Last TSH - wnl ? ?

## 2023-03-22 NOTE — Progress Notes (Signed)
Established Patient Office Visit  Subjective:  Patient ID: Jorge Bishop, male    DOB: 14-Dec-1935  Age: 87 y.o. MRN: [id number]  CC:  Chief Complaint  Patient presents with   Coronary Artery Disease    Four month follow up   Medication Problem    Duloxetine, patient wants to discontinue     HPI Jorge Bishop is a 87 y.o. male with past medical history of CAD s/p CABG, hypertension, HLD, prediabetes, hypothyroidism, MGUS, GERD, lumbar spinal stenosis s/p lumbar laminectomy and BPH who presents for f/u of his chronic medical conditions.  CAD s/p CABG and hypertension: BP is well-controlled. Takes Coreg 6.25 mg QD regularly. Patient denies headache, dizziness, chest pain, dyspnea or palpitations.  He follows up with cardiology.  He is on Coreg, aspirin and atorvastatin.   MGUS: He was diagnosed with MGUS, and had a skeletal survey done, which showed 7 mm sclerotic lesion over left humerus.  Followed by oncology currently.  Neuropathy: He was started on gabapentin, but he felt drowsiness with it. He had stomatitis and lip swelling with duloxetine, and has stopped taking it. He still complains of burning pain in his legs and feet.  Hypothyroidism: Takes levothyroxine 50 mcg QD.  Denies any recent change in weight or appetite.  Chronic constipation: He reports chronic constipation, but has improved with lactulose.  He denies any melena or hematochezia.  He used to strain hard while passing stool.  He has tried OTC stool softener-Dulcolax, MiraLAX, fiber supplement/Metamucil and probiotics without relief.  He has had to do manual disimpaction once as well.  Bilateral knee pain: He complains of bilateral knee pain with swelling.  His pain is worse upon standing and walking.  He has remote history of meniscal tear.  Denies any recent injury or fall.  He takes tramadol as needed for pain,  used to get it from [place], but requests it from [place] now.  Past Medical History:  Diagnosis Date    Arteriosclerotic cardiovascular disease (ASCVD)    Coronary artery bypass graft surgery in 12/98; DES to the RCA SVG in 07/2002; restenosis of the saphenous vein graft stent and renal intervention in 2006; 70% first diagonal present; calf in 11/2007-total obstruction of the saphenous vein graft to the first marginal   Benign prostatic hypertrophy    Cerebrovascular disease    60% bilateral internal carotid artery stenosis in 2012   Coronary artery disease    4 stents   Diaphragmatic hernia 01/15/2021   Diverticulosis    DJD (degenerative joint disease)    Erectile dysfunction    GERD (gastroesophageal reflux disease)    History of blood transfusion    History of kidney stones    Hyperlipidemia    Lipid profile in 12/2009:104, 91, 36, 50   Hypertension    Kidney stone 09/08/2017   Migraine headache    Right bundle branch block    Right ureteral stone 03/08/2019   Shortness of breath    Thrombocytopenia (HCC)    platelets of 104 in 03/2009   Upper gastrointestinal hemorrhage 10/09    Helicobacter pylori positive; subsequent laparoscopic fundoplication-2009    Past Surgical History:  Procedure Laterality Date   CARDIAC CATHETERIZATION     4 cardiac stents   CATARACT EXTRACTION W/PHACO Right 07/25/2012   Procedure: CATARACT EXTRACTION PHACO AND INTRAOCULAR LENS PLACEMENT (IOC);  Surgeon: Gemma Payor, MD;  Location: [facility];  Service: Ophthalmology;  Laterality: Right;  CDE:23.31   CATARACT EXTRACTION W/PHACO Left 08/18/2012  Procedure: CATARACT EXTRACTION PHACO AND INTRAOCULAR LENS PLACEMENT (IOC);  Surgeon: Gemma Payor, MD;  Location: [facility];  Service: Ophthalmology;  Laterality: Left;  CDE: 16.07   COLONOSCOPY N/A 08/01/2014   Procedure: COLONOSCOPY;  Surgeon: Malissa Hippo, MD;  Location: [facility];  Service: Endoscopy;  Laterality: N/A;  125 - moved to 10:30 - Ann to notify pt   CORONARY ARTERY BYPASS GRAFT  1998    CYSTOSCOPY/URETEROSCOPY/HOLMIUM LASER/STENT PLACEMENT Right 11/12/2017    Procedure: CYSTOSCOPY/RIGHT RETROGRADE PYELOGRAM/RIGHT URETEROSCOPY/HOLMIUM LASER LITHOTRIPSY RIGHT URETERAL CALCULUS/RIGHT URETERAL STENT PLACEMENT;  Surgeon: Bjorn Pippin, MD;  Location: [facility];  Service: Urology;  Laterality: Right;  right ureteral calculus given to patient's wife per Dr. Annabell Howells   CYSTOSCOPY/URETEROSCOPY/HOLMIUM LASER/STENT PLACEMENT Right 03/07/2019   Procedure: CYSTOSCOPY RIGHT RETROGRADE RIGHT URETEROSCOPY;  Surgeon: Bjorn Pippin, MD;  Location: [facility];  Service: Urology;  Laterality: Right;   EXTRACORPOREAL SHOCK WAVE LITHOTRIPSY Right 09/13/2017   Procedure: RIGHT EXTRACORPOREAL SHOCK WAVE LITHOTRIPSY (ESWL);  Surgeon: Crista Elliot, MD;  Location: [facility];  Service: Urology;  Laterality: Right;   HERNIA REPAIR     right inguinal   LAPAROSCOPIC NISSEN FUNDOPLICATION  1997, 2009    revision in 2009 along with repair of paraesophagel hernia    PARAESOPHAGEAL HERNIA REPAIR     TRANSURETHRAL RESECTION OF PROSTATE N/A 03/07/2019   Procedure: TRANSURETHRAL RESECTION OF THE PROSTATE (TURP);  Surgeon: Bjorn Pippin, MD;  Location: [facility];  Service: Urology;  Laterality: N/A;    Family History  Problem Relation Age of Onset   CAD Father     Social History   Socioeconomic History   Marital status: Married    Spouse name: Not on file   Number of children: Not on file   Years of education: Not on file   Highest education level: Not on file  Occupational History   Occupation: Full time    Employer: RETIRED  Tobacco Use   Smoking status: Never   Smokeless tobacco: Never  Vaping Use   Vaping status: Never Used  Substance and Sexual Activity   Alcohol use: No    Alcohol/week: 0.0 standard drinks of alcohol   Drug use: No   Sexual activity: Yes    Birth control/protection: None  Other Topics Concern   Not on file  Social History Narrative   Right handed   Drinks caffeine   Two story home   Social Determinants of Health   Financial Resource Strain: Low Risk   (02/17/2023)   Overall Financial Resource Strain (CARDIA)    Difficulty of Paying Living Expenses: Not hard at all  Food Insecurity: No Food Insecurity (02/17/2023)   Hunger Vital Sign    Worried About Running Out of Food in the Last Year: Never true    Ran Out of Food in the Last Year: Never true  Transportation Needs: No Transportation Needs (02/17/2023)   PRAPARE - Administrator, Civil Service (Medical): No    Lack of Transportation (Non-Medical): No  Physical Activity: Sufficiently Active (02/17/2023)   Exercise Vital Sign    Days of Exercise per Week: 3 days    Minutes of Exercise per Session: 50 min  Stress: No Stress Concern Present (02/17/2023)   [employer] of Occupational Health - Occupational Stress Questionnaire    Feeling of Stress : Not at all  Social Connections: Unknown (02/17/2023)   Social Connection and Isolation Panel [NHANES]    Frequency of Communication with Friends and Family: Three  times a week    Frequency of Social Gatherings with Friends and Family: Once a week    Attends Religious Services: Never    Database administrator or Organizations: Patient declined    Attends Banker Meetings: Patient declined    Marital Status: Married  Catering manager Violence: Not At Risk (02/18/2023)   Humiliation, Afraid, Rape, and Kick questionnaire    Fear of Current or Ex-Partner: No    Emotionally Abused: No    Physically Abused: No    Sexually Abused: No    Outpatient Medications Prior to Visit  Medication Sig Dispense Refill   aspirin 81 MG tablet Take 81 mg by mouth daily.       carvedilol (COREG) 6.25 MG tablet Take 1 tablet (6.25 mg total) by mouth daily. 90 tablet 2   Cholecalciferol (VITAMIN D3) 50 MCG (2000 UT) TABS Take 2,000 Units by mouth daily.     cyanocobalamin 1000 MCG tablet Take 1,000 mcg by mouth daily.     fluorometholone (FML) 0.1 % ophthalmic suspension SMARTSIG:1 Drop(s) In Eye(s) 2-4 Times Daily PRN     nitroGLYCERIN  (NITROSTAT) 0.4 MG SL tablet Place 1 tablet (0.4 mg total) under the tongue every 5 (five) minutes as needed for chest pain. 30 tablet 0   atorvastatin (LIPITOR) 20 MG tablet Take 20 mg by mouth at bedtime.       DULoxetine (CYMBALTA) 30 MG capsule Take 1 capsule (30 mg total) by mouth daily. 30 capsule 3   Lactulose 20 GM/30ML SOLN Take 30 mLs (20 g total) by mouth daily as needed (Constipation). 450 mL 1   lidocaine (XYLOCAINE) 2 % solution Use as directed 15 mLs in the mouth or throat as needed for mouth pain. 100 mL 0   ondansetron (ZOFRAN) 4 MG tablet Take 1 tablet (4 mg total) by mouth every 8 (eight) hours as needed for nausea or vomiting. (Patient not taking: Reported on 02/02/2023) 90 tablet 1   promethazine (PHENERGAN) 25 MG tablet Take 25 mg by mouth every 6 (six) hours as needed for nausea or vomiting. (Patient not taking: Reported on 02/02/2023)     SYNTHROID 50 MCG tablet Take 50 mcg by mouth daily.     traMADol (ULTRAM) 50 MG tablet Take 50 mg by mouth every 6 (six) hours as needed (pain). (Patient not taking: Reported on 02/18/2023)     No facility-administered medications prior to visit.    Allergies  Allergen Reactions   Bactrim [Sulfamethoxazole-Trimethoprim] Anaphylaxis   Clarithromycin Anaphylaxis    REACTION: UNKNOWN REACTION   Codeine Nausea And Vomiting   Erythromycin Anaphylaxis   Sulfa Antibiotics Anaphylaxis   Duloxetine     Lip swelling   Trimethoprim     ROS Review of Systems  Constitutional:  Negative for chills and fever.  HENT:  Negative for congestion and sore throat.   Eyes:  Negative for pain and discharge.  Respiratory:  Negative for cough and shortness of breath.   Cardiovascular:  Negative for chest pain and palpitations.  Gastrointestinal:  Positive for constipation. Negative for diarrhea, nausea and vomiting.  Endocrine: Negative for polydipsia and polyuria.  Genitourinary:  Negative for dysuria and hematuria.  Musculoskeletal:  Positive for  arthralgias, back pain and gait problem. Negative for neck pain and neck stiffness.  Skin:  Negative for rash.  Neurological:  Positive for numbness. Negative for dizziness, weakness and headaches.  Psychiatric/Behavioral:  Negative for agitation and behavioral problems.  Objective:    Physical Exam Vitals reviewed.  Constitutional:      General: He is not in acute distress.    Appearance: He is not diaphoretic.  HENT:     Head: Normocephalic and atraumatic.     Nose: Nose normal.     Mouth/Throat:     Mouth: Mucous membranes are moist.  Eyes:     General: No scleral icterus.    Extraocular Movements: Extraocular movements intact.  Cardiovascular:     Rate and Rhythm: Normal rate and regular rhythm.     Pulses: Normal pulses.     Heart sounds: Normal heart sounds. No murmur heard. Pulmonary:     Breath sounds: Normal breath sounds. No wheezing or rales.  Musculoskeletal:     Cervical back: Neck supple. No tenderness.     Right knee: Swelling present. Decreased range of motion.     Left knee: Swelling present. Decreased range of motion.     Right lower leg: No edema.     Left lower leg: No edema.  Skin:    General: Skin is warm and dry.     Findings: No rash.  Neurological:     General: No focal deficit present.     Mental Status: He is alert and oriented to person, place, and time.     Sensory: Sensory deficit (B/l feet) present.     Motor: Weakness (B/l LE) present.     Gait: Gait abnormal.  Psychiatric:        Mood and Affect: Mood normal.        Behavior: Behavior normal.     BP 113/64 (BP Location: Right Arm, Patient Position: Sitting, Cuff Size: Normal)   Pulse 81   Ht 6' (1.829 m)   Wt 168 lb 12.8 oz (76.6 kg)   SpO2 97%   BMI 22.89 kg/m  Wt Readings from Last 3 Encounters:  03/22/23 168 lb 12.8 oz (76.6 kg)  02/18/23 169 lb (76.7 kg)  02/02/23 170 lb 9.6 oz (77.4 kg)    Lab Results  Component Value Date   TSH 2.794 01/26/2023   Lab Results   Component Value Date   WBC 5.3 01/26/2023   HGB 15.6 01/26/2023   HCT 48.8 01/26/2023   MCV 93.5 01/26/2023   PLT 112 (L) 01/26/2023   Lab Results  Component Value Date   NA 134 (L) 01/26/2023   K 4.7 01/26/2023   CO2 25 01/26/2023   GLUCOSE 106 (H) 01/26/2023   BUN 20 01/26/2023   CREATININE 0.94 01/26/2023   BILITOT 1.3 (H) 01/26/2023   ALKPHOS 67 01/26/2023   AST 27 01/26/2023   ALT 28 01/26/2023   PROT 7.4 01/26/2023   ALBUMIN 4.2 01/26/2023   CALCIUM 9.4 01/26/2023   ANIONGAP 9 01/26/2023   EGFR 80 04/07/2022   Lab Results  Component Value Date   CHOL 131 04/07/2022   Lab Results  Component Value Date   HDL 29 (L) 04/07/2022   Lab Results  Component Value Date   LDLCALC 77 04/07/2022   Lab Results  Component Value Date   TRIG 143 04/07/2022   Lab Results  Component Value Date   CHOLHDL 4.5 04/07/2022   Lab Results  Component Value Date   HGBA1C 5.9 (H) 09/15/2021      Assessment & Plan:   Problem List Items Addressed This Visit       Cardiovascular and Mediastinum   Hypertension (Chronic)    BP Readings from Last 1  Encounters:  03/22/23 113/64   Overall well-controlled with Coreg 6.25 mg once daily (recommended by Cardiology) Counseled for compliance with the medications Advised DASH diet and moderate exercise/walking as tolerated      Relevant Medications   atorvastatin (LIPITOR) 20 MG tablet   CAD (coronary artery disease) (Chronic)    CABG in 12/98; DES to the RCA SVG in 07/2002; restenosis SVG stent and re-intervention in 2006; 70% residual D1; cath in 11/2007: TO SVG M1  Followed by Cardiology On Aspirin, statin and Coreg Has NTG PRN, does not have any anginal symptoms currently      Relevant Medications   atorvastatin (LIPITOR) 20 MG tablet     Digestive   Chronic constipation    Chronic idiopathic constipation Has tried multiple OTC laxatives and stool softeners without much relief Also takes prunes Linzess was not  covered, now better with Lactulose Advised to maintain adequate hydration      Relevant Medications   Lactulose 20 GM/30ML SOLN     Endocrine   Hypothyroidism    On Levothyroxine 50 mcg QD Last TSH - wnl      Relevant Medications   levothyroxine (SYNTHROID) 50 MCG tablet     Nervous and Auditory   Idiopathic peripheral neuropathy - Primary    Had drowsiness with gabapentin 100 mg dose Recently started Cymbalta 30 mg QD - had excellent response, but had stomatitis and lip swelling, discontinue Cymbalta Advised to take Vit B12 1000 mcg QD Has been evaluated by neurology        Musculoskeletal and Integument   Primary osteoarthritis of both knees    Tylenol PRN Avoid oral NSAIDs due to CAD Tramadol as needed for severe pain - refilled Referred to Orthopedic surgery      Relevant Medications   traMADol (ULTRAM) 50 MG tablet     Genitourinary   Benign localized prostatic hyperplasia with lower urinary tract symptoms (LUTS)    Has had prostate surgery, followed by Urology Was on Tamsulosin in the past Was given Myrbetriq for urge incontinence by Urology, but was not able to get it        Other   Hyperlipidemia (Chronic)    On statin Check lipid profile in the next visit      Relevant Medications   atorvastatin (LIPITOR) 20 MG tablet   MGUS (monoclonal gammopathy of unknown significance)    Followed by oncology Recently bone scan showed left humeral sclerotic lesion, plan to get repeat bone scan in 02/25      Other Visit Diagnoses     Nausea       Relevant Medications   ondansetron (ZOFRAN) 4 MG tablet   promethazine (PHENERGAN) 25 MG tablet      Meds ordered this encounter  Medications   atorvastatin (LIPITOR) 20 MG tablet    Sig: Take 1 tablet (20 mg total) by mouth at bedtime.    Dispense:  90 tablet    Refill:  3   Lactulose 20 GM/30ML SOLN    Sig: Take 30 mLs (20 g total) by mouth daily as needed (Constipation).    Dispense:  450 mL    Refill:   3   levothyroxine (SYNTHROID) 50 MCG tablet    Sig: Take 1 tablet (50 mcg total) by mouth daily before breakfast.    Dispense:  90 tablet    Refill:  1   ondansetron (ZOFRAN) 4 MG tablet    Sig: Take 1 tablet (4 mg total) by mouth every  8 (eight) hours as needed for nausea or vomiting.    Dispense:  30 tablet    Refill:  0   promethazine (PHENERGAN) 25 MG tablet    Sig: Take 1 tablet (25 mg total) by mouth every 6 (six) hours as needed for nausea or vomiting.    Dispense:  30 tablet    Refill:  0   traMADol (ULTRAM) 50 MG tablet    Sig: Take 1 tablet (50 mg total) by mouth every 12 (twelve) hours as needed for moderate pain or severe pain (pain).    Dispense:  30 tablet    Refill:  0    Follow-up: Return in about 6 months (around 09/20/2023) for CAD and HTN.    Anabel Halon, MD

## 2023-03-22 NOTE — Assessment & Plan Note (Addendum)
BP Readings from Last 1 Encounters:  03/22/23 113/64   Overall well-controlled with Coreg 6.25 mg once daily (recommended by Cardiology) Counseled for compliance with the medications Advised DASH diet and moderate exercise/walking as tolerated

## 2023-07-14 ENCOUNTER — Other Ambulatory Visit: Payer: Self-pay | Admitting: Internal Medicine

## 2023-07-27 ENCOUNTER — Inpatient Hospital Stay: Payer: Medicare Other | Attending: Hematology

## 2023-07-27 ENCOUNTER — Telehealth: Payer: Self-pay | Admitting: *Deleted

## 2023-07-27 ENCOUNTER — Ambulatory Visit (HOSPITAL_COMMUNITY)
Admission: RE | Admit: 2023-07-27 | Discharge: 2023-07-27 | Disposition: A | Discharge status: Home / Self Care | Payer: Medicare Other | Source: Ambulatory Visit | Attending: Physician Assistant | Admitting: Physician Assistant

## 2023-07-27 DIAGNOSIS — D472 Monoclonal gammopathy: Secondary | ICD-10-CM | POA: Insufficient documentation

## 2023-07-27 DIAGNOSIS — R936 Abnormal findings on diagnostic imaging of limbs: Secondary | ICD-10-CM | POA: Insufficient documentation

## 2023-07-27 DIAGNOSIS — D696 Thrombocytopenia, unspecified: Secondary | ICD-10-CM | POA: Insufficient documentation

## 2023-07-27 LAB — COMPREHENSIVE METABOLIC PANEL
ALT: 30 U/L (ref 0–44)
AST: 29 U/L (ref 15–41)
Albumin: 4.1 g/dL (ref 3.5–5.0)
Alkaline Phosphatase: 62 U/L (ref 38–126)
Anion gap: 7 (ref 5–15)
BUN: 25 mg/dL — ABNORMAL HIGH (ref 8–23)
CO2: 29 mmol/L (ref 22–32)
Calcium: 9.4 mg/dL (ref 8.9–10.3)
Chloride: 107 mmol/L (ref 98–111)
Creatinine, Ser: 0.89 mg/dL (ref 0.61–1.24)
GFR, Estimated: >60 mL/min (ref >60)
Glucose, Bld: 83 mg/dL (ref 70–99)
Potassium: 4.5 mmol/L (ref 3.5–5.1)
Sodium: 143 mmol/L (ref 135–145)
Total Bilirubin: 1.2 mg/dL (ref 0.0–1.2)
Total Protein: 7.3 g/dL (ref 6.5–8.1)

## 2023-07-27 LAB — CBC WITH DIFFERENTIAL/PLATELET
Abs Immature Granulocytes: 0.01 10*3/uL (ref 0.00–0.07)
Basophils Absolute: 0 10*3/uL (ref 0.0–0.1)
Basophils Relative: 1 %
Eosinophils Absolute: 0.2 10*3/uL (ref 0.0–0.5)
Eosinophils Relative: 3 %
HCT: 46.9 % (ref 39.0–52.0)
Hemoglobin: 15.1 g/dL (ref 13.0–17.0)
Immature Granulocytes: 0 %
Lymphocytes Relative: 31 %
Lymphs Abs: 2 10*3/uL (ref 0.7–4.0)
MCH: 30.3 pg (ref 26.0–34.0)
MCHC: 32.2 g/dL (ref 30.0–36.0)
MCV: 94 fL (ref 80.0–100.0)
Monocytes Absolute: 0.7 10*3/uL (ref 0.1–1.0)
Monocytes Relative: 10 %
Neutro Abs: 3.5 10*3/uL (ref 1.7–7.7)
Neutrophils Relative %: 55 %
Platelets: 129 10*3/uL — ABNORMAL LOW (ref 150–400)
RBC: 4.99 MIL/uL (ref 4.22–5.81)
RDW: 13.2 % (ref 11.5–15.5)
WBC: 6.4 10*3/uL (ref 4.0–10.5)
nRBC: 0 % (ref 0.0–0.2)

## 2023-07-27 LAB — LACTATE DEHYDROGENASE: LDH: 159 U/L (ref 98–192)

## 2023-07-27 NOTE — Telephone Encounter (Signed)
He came in for labs for a future visit with [place]. We have not seen him since August. He came into my office to let me know that he had fallen 2 weeks ago and hit his spine and right kidney area and is concerned about there being a kidney injury. Said he had called PCP office 2 times to get an appointment. I advised him to go to the urgent care. Does not appear to be anything acute going on at this time. Just reports soreness. Dr. Allena Katz made aware and agreed with recommendation.

## 2023-07-28 LAB — KAPPA/LAMBDA LIGHT CHAINS
Kappa free light chain: 39.9 mg/L — ABNORMAL HIGH (ref 3.3–19.4)
Kappa, lambda light chain ratio: 2.43 — ABNORMAL HIGH (ref 0.26–1.65)
Lambda free light chains: 16.4 mg/L (ref 5.7–26.3)

## 2023-07-29 LAB — PROTEIN ELECTROPHORESIS, SERUM
A/G Ratio: 1.2 (ref 0.7–1.7)
Albumin ELP: 3.8 g/dL (ref 2.9–4.4)
Alpha-1-Globulin: 0.2 g/dL (ref 0.0–0.4)
Alpha-2-Globulin: 0.7 g/dL (ref 0.4–1.0)
Beta Globulin: 1 g/dL (ref 0.7–1.3)
Gamma Globulin: 1.2 g/dL (ref 0.4–1.8)
Globulin, Total: 3.1 g/dL (ref 2.2–3.9)
M-Spike, %: 0.6 g/dL — ABNORMAL HIGH
Total Protein ELP: 6.9 g/dL (ref 6.0–8.5)

## 2023-08-02 NOTE — Progress Notes (Unsigned)
 [facility] [address], [place] [address]   CLINIC:  Medical Oncology/Hematology  PCP:  Anabel Halon, MD [address] [place] [address] [phone]   REASON FOR VISIT:  Follow-up for MGUS + thrombocytopenia + sclerotic lesion of left humerus  PRIOR THERAPY: None  CURRENT THERAPY: Active surveillance  INTERVAL HISTORY:   Jorge Bishop 88 y.o. male returns for routine follow-up of MGUS, thrombocytopenia, and sclerotic lesion of left humerus.  He was last seen by Rojelio Brenner PA-C on 02/02/2023.  At today's visit, he reports feeling fair, although he does report falling at home 2 weeks ago, with some residual soreness in his right flank where he landed on the side of a step.   MGUS:  He continues to have chronic peripheral neuropathy and imbalance issues.   He reports some ongoing joint pain, particularly in his bilateral shoulders.  He denies any recent fractures.  He denies any B symptoms such as fever, chills, night sweats, unintentional weight loss.  He has some chronic tinnitus, hearing loss, and progressive blurry vision; denies any acute neurologic changes.  He denies any new masses or lymphadenopathy.  THROMBOCYTOPENIA:  He reports that he bruises and bleeds easily. He reports that his gums will bleed when he "brushes hard."  He denies any major bleeding episodes - no epistaxis, no hemoptysis, hematuria, no rectal bleeding or melena.  He has 25% energy and % appetite.  His weight has been stable since his last visit.  ASSESSMENT & PLAN:  1.  IgG kappa MGUS - Worked up by neurology (Dr. Nita Sickle) for lower extremity neuropathy and worsening balance problems, which showed M spike positive - Hematology workup (09/03/2021): Immunofixation with IgG kappa monoclonal protein and SPEP 0.4. - Skeletal survey (07/28/2022): 7 mm sclerotic lesion in mid left humerus appears new from prior.  No other sclerotic or lytic osseous lesions are  identified - sclerotic lesion addressed below - Most recent skeletal survey (07/27/2023): Pending radiology review  - Most recent MGUS/myeloma panel (07/27/2023) M spike stable at 0.6 Stable elevation in FLC ratio 2.43 (kappa 39.9, lambda 16.4) No CRAB features: Hemoglobin 15.1, creatinine 0.89, calcium 9.4.  LDH normal. - Continues to report chronic neuropathy, balance issues, tinnitus, hearing loss, and blurry vision. - He reports pain around his left hip, left shoulder, and right wrist.  (Skeletal survey negative for any lytic lesions at these locations.  Left humerus sclerotic lesion addressed below.) - Reports 30 pound weight loss over the past 2 years due to decreased appetite.  His weight has been stable in the past 6 months. - PLAN:  Next MGUS/myeloma panel due in 6 months (August 2025)  - Will follow results of skeletal survey and call patient if any new or concerning abnormalities.  Otherwise, annual skeletal survey next due February 2026.    2.  Thrombocytopenia - Patient reports that he has had low platelets since around 2009, when he was hospitalized for bleeding ulcer and H. pylori. - Hematology workup: Normal LDH.  Negative ANA and rheumatoid factor. Normal B12, MMA, folate, copper. Hepatitis B and hepatitis C negative. Immature platelet fraction normal at 6.2% - Reports easy bruising and bleeding.  He reports that his gums will bleed when he "brushes hard."  He has some pinkish discoloration to his sputum in the morning; no frank epistaxis or hemoptysis. - CT abdomen/pelvis (09/04/2022) showed normal spleen and liver.  No lymphadenopathy. - Most recent CBC/D (07/27/2023): Platelets 129, stable. - No  masses or lymphadenopathy on exam - Differential diagnosis includes mild chronic ITP versus early MDS  - PLAN: Continue monitoring of platelets. - Extensive discussion with patient regarding differential diagnosis (chronic ITP versus early MDS), and that no treatment is indicated at  present.  We would consider bone marrow biopsy if development of any B symptoms or major deviation from baseline.    3.  Sclerotic lesion of left humerus - Skeletal survey (07/28/2022): 7 mm sclerotic lesion in mid left humerus appears new from prior.  No other sclerotic or lytic osseous lesions are identified. - Follow-up imaging (10/26/2022) with x-ray of left humerus: Somewhat faint sclerotic lesion in left humeral shaft unchanged from 07/27/2022 - Most recent humerus x-ray (01/20/2023): Stable small sclerotic lesion in the left mid humeral shaft which has nonaggressive features and does not have the typical appearance of a sclerotic bone metastasis or concerning primary bone lesion. - PSA was normal at 1.20 on 10/26/2022.  (PSA 2.56 in 2015) -- CT A/P (09/04/2022): No abnormal lymphadenopathy or evidence of malignancy or metastatic disease - Skeletal survey (07/27/2023): Pending radiology read  - PLAN: No further oncology workup at this time.  Attention to this region on skeletal survey in 6 months. - Since he does have some left shoulder pain and tenderness, recommend that he [facility] with [facility] to receive [facility] to orth[facility] specialist.  4.  Peripheral neuropathy - Lower extremity neuropathy for the past several years - Previously took gabapentin, but self-discontinued this due to gait instability  5.  Other history - Lives at home with his wife.  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  He is retired from [employer].  He was exposed to agent orange in [place].  He is a non-smoker.   PLAN SUMMARY: >> Labs in 6 months = CBC/D, CMP, LDH, SPEP, light chains >> OFFICE visit in 6 months (1 week after labs)     REVIEW OF SYSTEMS:  Review of Systems  Constitutional:  Positive for fatigue. Negative for appetite change, chills, diaphoresis, fever and unexpected weight change.  HENT:   Positive for trouble swallowing (at times). Negative for lump/mass and nosebleeds.    Eyes:  Negative for eye problems.  Respiratory:  Positive for shortness of breath. Negative for cough and hemoptysis.   Cardiovascular:  Positive for palpitations. Negative for chest pain and leg swelling.  Gastrointestinal:  Positive for constipation. Negative for abdominal pain, blood in stool, diarrhea, nausea and vomiting.  Genitourinary:  Negative for hematuria.   Musculoskeletal:  Positive for arthralgias (bilateral shoulders) and back pain.  Skin: Negative.   Neurological:  Positive for dizziness, headaches and numbness. Negative for light-headedness.  Hematological:  Does not bruise/bleed easily.     PHYSICAL EXAM:  ECOG PERFORMANCE STATUS: 1 - Symptomatic but completely ambulatory  Vitals:   08/03/23 1258  BP: 139/69  Pulse: 71  Temp: 98 F (36.7 C)  SpO2: 99%    Filed Weights   08/03/23 1258  Weight: 173 lb (78.5 kg)    Physical Exam Vitals reviewed.  Constitutional:      Appearance: Normal appearance.  Cardiovascular:     Rate and Rhythm: Regular rhythm.     Heart sounds: Normal heart sounds.  Pulmonary:     Breath sounds: Normal breath sounds.  Skin:    Findings: Bruising present.  Neurological:     Mental Status: He is alert.  Psychiatric:        Mood and Affect: Mood  normal.        Behavior: Behavior normal.    PAST MEDICAL/SURGICAL HISTORY:  Past Medical History:  Diagnosis Date   Arteriosclerotic cardiovascular disease (ASCVD)    Coronary artery bypass graft surgery in 12/98; DES to the RCA SVG in 07/2002; restenosis of the saphenous vein graft stent and renal intervention in 2006; 70% first diagonal present; calf in 11/2007-total obstruction of the saphenous vein graft to the first marginal   Benign prostatic hypertrophy    Cerebrovascular disease    60% bilateral internal carotid artery stenosis in 2012   Coronary artery disease    4 stents   Diaphragmatic hernia 01/15/2021   Diverticulosis    DJD (degenerative joint disease)    Erectile  dysfunction    GERD (gastroesophageal reflux disease)    History of blood transfusion    History of kidney stones    Hyperlipidemia    Lipid profile in 12/2009:104, 91, 36, 50   Hypertension    Kidney stone 09/08/2017   Migraine headache    Right bundle branch block    Right ureteral stone 03/08/2019   Shortness of breath    Thrombocytopenia (HCC)    platelets of 104 in 03/2009   Upper gastrointestinal hemorrhage 10/09    Helicobacter pylori positive; subsequent laparoscopic fundoplication-2009   Past Surgical History:  Procedure Laterality Date   CARDIAC CATHETERIZATION     4 cardiac stents   CATARACT EXTRACTION W/PHACO Right 07/25/2012   Procedure: CATARACT EXTRACTION PHACO AND INTRAOCULAR LENS PLACEMENT (IOC);  Surgeon: Gemma Payor, MD;  Location: [facility];  Service: Ophthalmology;  Laterality: Right;  CDE:23.31   CATARACT EXTRACTION W/PHACO Left 08/18/2012   Procedure: CATARACT EXTRACTION PHACO AND INTRAOCULAR LENS PLACEMENT (IOC);  Surgeon: Gemma Payor, MD;  Location: [facility];  Service: Ophthalmology;  Laterality: Left;  CDE: 16.07   COLONOSCOPY N/A 08/01/2014   Procedure: COLONOSCOPY;  Surgeon: Malissa Hippo, MD;  Location: [facility];  Service: Endoscopy;  Laterality: N/A;  125 - moved to 10:30 - Ann to notify pt   CORONARY ARTERY BYPASS GRAFT  1998    CYSTOSCOPY/URETEROSCOPY/HOLMIUM LASER/STENT PLACEMENT Right 11/12/2017   Procedure: CYSTOSCOPY/RIGHT RETROGRADE PYELOGRAM/RIGHT URETEROSCOPY/HOLMIUM LASER LITHOTRIPSY RIGHT URETERAL CALCULUS/RIGHT URETERAL STENT PLACEMENT;  Surgeon: Bjorn Pippin, MD;  Location: [facility];  Service: Urology;  Laterality: Right;  right ureteral calculus given to patient's wife per Dr. Annabell Howells   CYSTOSCOPY/URETEROSCOPY/HOLMIUM LASER/STENT PLACEMENT Right 03/07/2019   Procedure: CYSTOSCOPY RIGHT RETROGRADE RIGHT URETEROSCOPY;  Surgeon: Bjorn Pippin, MD;  Location: [facility];  Service: Urology;  Laterality: Right;   EXTRACORPOREAL SHOCK WAVE LITHOTRIPSY Right 09/13/2017    Procedure: RIGHT EXTRACORPOREAL SHOCK WAVE LITHOTRIPSY (ESWL);  Surgeon: Crista Elliot, MD;  Location: [facility];  Service: Urology;  Laterality: Right;   HERNIA REPAIR     right inguinal   LAPAROSCOPIC NISSEN FUNDOPLICATION  1997, 2009    revision in 2009 along with repair of paraesophagel hernia    PARAESOPHAGEAL HERNIA REPAIR     TRANSURETHRAL RESECTION OF PROSTATE N/A 03/07/2019   Procedure: TRANSURETHRAL RESECTION OF THE PROSTATE (TURP);  Surgeon: Bjorn Pippin, MD;  Location: [facility];  Service: Urology;  Laterality: N/A;    SOCIAL HISTORY:  Social History   Socioeconomic History   Marital status: Married    Spouse name: Not on file   Number of children: Not on file   Years of education: Not on file   Highest education level: Not on file  Occupational History   Occupation: Full time  Employer: RETIRED  Tobacco Use   Smoking status: Never   Smokeless tobacco: Never  Vaping Use   Vaping status: Never Used  Substance and Sexual Activity   Alcohol use: No    Alcohol/week: 0.0 standard drinks of alcohol   Drug use: No   Sexual activity: Yes    Birth control/protection: None  Other Topics Concern   Not on file  Social History Narrative   Right handed   Drinks caffeine   Two story home   Social Drivers of Health   Financial Resource Strain: Low Risk  (02/17/2023)   Overall Financial Resource Strain (CARDIA)    Difficulty of Paying Living Expenses: Not hard at all  Food Insecurity: No Food Insecurity (02/17/2023)   Hunger Vital Sign    Worried About Running Out of Food in the Last Year: Never true    Ran Out of Food in the Last Year: Never true  Transportation Needs: No Transportation Needs (02/17/2023)   PRAPARE - Administrator, Civil Service (Medical): No    Lack of Transportation (Non-Medical): No  Physical Activity: Sufficiently Active (02/17/2023)   Exercise Vital Sign    Days of Exercise per Week: 3 days    Minutes of Exercise per Session: 50 min   Stress: No Stress Concern Present (02/17/2023)   [employer] of Occupational Health - Occupational Stress Questionnaire    Feeling of Stress : Not at all  Social Connections: Unknown (02/17/2023)   Social Connection and Isolation Panel [NHANES]    Frequency of Communication with Friends and Family: Three times a week    Frequency of Social Gatherings with Friends and Family: Once a week    Attends Religious Services: Never    Database administrator or Organizations: Patient declined    Attends Banker Meetings: Patient declined    Marital Status: Married  Catering manager Violence: Not At Risk (02/18/2023)   Humiliation, Afraid, Rape, and Kick questionnaire    Fear of Current or Ex-Partner: No    Emotionally Abused: No    Physically Abused: No    Sexually Abused: No    FAMILY HISTORY:  Family History  Problem Relation Age of Onset   CAD Father     CURRENT MEDICATIONS:  Outpatient Encounter Medications as of 08/03/2023  Medication Sig   aspirin 81 MG tablet Take 81 mg by mouth daily.     atorvastatin (LIPITOR) 20 MG tablet Take 1 tablet (20 mg total) by mouth at bedtime.   carvedilol (COREG) 6.25 MG tablet Take 1 tablet (6.25 mg total) by mouth daily.   cyanocobalamin 1000 MCG tablet Take 1,000 mcg by mouth daily.   fluorometholone (FML) 0.1 % ophthalmic suspension SMARTSIG:1 Drop(s) In Eye(s) 2-4 Times Daily PRN   Lactulose 20 GM/30ML SOLN Take 30 mLs (20 g total) by mouth daily as needed (Constipation).   levothyroxine (SYNTHROID) 50 MCG tablet Take 1 tablet (50 mcg total) by mouth daily before breakfast.   ondansetron (ZOFRAN) 4 MG tablet Take 1 tablet (4 mg total) by mouth every 8 (eight) hours as needed for nausea or vomiting.   promethazine (PHENERGAN) 25 MG tablet Take 1 tablet (25 mg total) by mouth every 6 (six) hours as needed for nausea or vomiting.   traMADol (ULTRAM) 50 MG tablet Take 1 tablet (50 mg total) by mouth every 12 (twelve) hours as needed for  moderate pain or severe pain (pain).   [DISCONTINUED] Cholecalciferol (VITAMIN D3) 50 MCG (2000 UT) TABS  Take 2,000 Units by mouth daily.   nitroGLYCERIN (NITROSTAT) 0.4 MG SL tablet Place 1 tablet (0.4 mg total) under the tongue every 5 (five) minutes as needed for chest pain.   No facility-administered encounter medications on file as of 08/03/2023.    ALLERGIES:  Allergies  Allergen Reactions   Bactrim [Sulfamethoxazole-Trimethoprim] Anaphylaxis   Clarithromycin Anaphylaxis    REACTION: UNKNOWN REACTION   Codeine Nausea And Vomiting   Erythromycin Anaphylaxis   Sulfa Antibiotics Anaphylaxis   Duloxetine     Lip swelling   Trimethoprim     LABORATORY DATA:  I have reviewed the labs as listed.  CBC    Component Value Date/Time   WBC 6.4 07/27/2023 1301   RBC 4.99 07/27/2023 1301   HGB 15.1 07/27/2023 1301   HGB 15.2 09/15/2021 1135   HCT 46.9 07/27/2023 1301   HCT 46.4 09/15/2021 1135   PLT 129 (L) 07/27/2023 1301   PLT 139 (L) 09/15/2021 1135   MCV 94.0 07/27/2023 1301   MCV 89 09/15/2021 1135   MCH 30.3 07/27/2023 1301   MCHC 32.2 07/27/2023 1301   RDW 13.2 07/27/2023 1301   RDW 12.4 09/15/2021 1135   LYMPHSABS 2.0 07/27/2023 1301   LYMPHSABS 1.5 09/15/2021 1135   MONOABS 0.7 07/27/2023 1301   EOSABS 0.2 07/27/2023 1301   EOSABS 0.2 09/15/2021 1135   BASOSABS 0.0 07/27/2023 1301   BASOSABS 0.0 09/15/2021 1135      Latest Ref Rng & Units 07/27/2023    1:01 PM 01/26/2023    2:31 PM 07/27/2022    2:30 PM  CMP  Glucose 70 - 99 mg/dL 83  161  096   BUN 8 - 23 mg/dL 25  20  21    Creatinine 0.61 - 1.24 mg/dL 0.45  4.09  8.11   Sodium 135 - 145 mmol/L 143  134  138   Potassium 3.5 - 5.1 mmol/L 4.5  4.7  4.9   Chloride 98 - 111 mmol/L 107  100  104   CO2 22 - 32 mmol/L 29  25  28    Calcium 8.9 - 10.3 mg/dL 9.4  9.4  9.4   Total Protein 6.5 - 8.1 g/dL 7.3  7.4  7.2   Total Bilirubin 0.0 - 1.2 mg/dL 1.2  1.3  1.4   Alkaline Phos 38 - 126 U/L 62  67  62   AST 15 -  41 U/L 29  27  25    ALT 0 - 44 U/L 30  28  24      DIAGNOSTIC IMAGING:  I have independently reviewed the relevant imaging and discussed with the patient.   WRAP UP:  All questions were answered. The patient knows to call the clinic with any problems, questions or concerns.  Medical decision making: Moderate  Time spent on visit: I spent 25 minutes counseling the patient face to face. The total time spent in the appointment was 40 minutes and more than 50% was on counseling.  Carnella Guadalajara, PA-C  08/03/23 2:03 PM

## 2023-08-03 ENCOUNTER — Inpatient Hospital Stay (HOSPITAL_BASED_OUTPATIENT_CLINIC_OR_DEPARTMENT_OTHER): Payer: Medicare Other | Admitting: Physician Assistant

## 2023-08-03 DIAGNOSIS — D472 Monoclonal gammopathy: Secondary | ICD-10-CM

## 2023-08-03 NOTE — Patient Instructions (Signed)
 [facility] **VISIT SUMMARY & IMPORTANT INSTRUCTIONS **   You were seen today by Rojelio Brenner PA-C for your follow-up visit.    MGUS As we discussed, MGUS is a precancerous condition where your body has abnormal plasma cells that make too many of a certain type of immunoglobulin. At this time, the elevation in your immunoglobulin is very mild and is not causing any issues. Will continue to keep watch on these labs every 6 months (next due in August 2025). You will be due for whole-body x-rays in 1 year (February 2026). The previously noted spot in your left arm bone (humerus) is stable and does not show any changes or features concerning for cancer.  LOW PLATELETS Your labs did not show any obvious cause of low platelets.  Your low platelets may be a sign of underlying bone marrow disorder or immune system dysfunction causing your body to attack its own platelets. Since your platelet levels are only mildly low, you do not need any invasive testing (i.e. bone marrow biopsy) or treatment at this time. We will continue to monitor closely.  FOLLOW-UP APPOINTMENT: Office visit in 6 months (1 week after labs)  ** Thank you for trusting me with your healthcare!  I strive to provide all of my patients with quality care at each visit.  If you receive a survey for this visit, I would be so grateful to you for taking the time to provide feedback.  Thank you in advance!  ~ Dyanna Seiter                   Dr. Doreatha Massed   &   Rojelio Brenner, PA-C   - - - - - - - - - - - - - - - - - -    Thank you for choosing [facility] at [facility] to provide your oncology and hematology care.  To afford each patient quality time with our provider, please arrive at least 15 minutes before your scheduled appointment time.   If you have a lab appointment with the Cancer Center please come in thru the Main Entrance and check in at the main  information desk.  You need to re-schedule your appointment should you arrive 10 or more minutes late.  We strive to give you quality time with our providers, and arriving late affects you and other patients whose appointments are after yours.  Also, if you no show three or more times for appointments you may be dismissed from the clinic at the providers discretion.     Again, thank you for choosing [facility].  Our hope is that these requests will decrease the amount of time that you wait before being seen by our physicians.       _____________________________________________________________  Should you have questions after your visit to [facility], please contact our office at [phone] and follow the prompts.  Our office hours are 8:00 a.m. and 4:30 p.m. Monday - Friday.  Please note that voicemails left after 4:00 p.m. may not be returned until the following business day.  We are closed weekends and major holidays.  You do have access to a nurse 24-7, just call the main number to the clinic [phone] and do not press any options, hold on the line and a nurse will answer the phone.    For prescription refill requests, have your pharmacy contact our office and allow 72  hours.

## 2023-08-09 ENCOUNTER — Encounter: Payer: Self-pay | Admitting: Physician Assistant

## 2023-08-12 ENCOUNTER — Other Ambulatory Visit: Payer: Self-pay | Admitting: Internal Medicine

## 2023-08-12 DIAGNOSIS — E039 Hypothyroidism, unspecified: Secondary | ICD-10-CM

## 2023-08-13 ENCOUNTER — Telehealth: Payer: Self-pay | Admitting: Internal Medicine

## 2023-08-13 MED ORDER — CARVEDILOL 6.25 MG PO TABS: 6.2500 mg | ORAL_TABLET | Freq: Every day | ORAL | 3 refills | Status: AC

## 2023-08-13 NOTE — Telephone Encounter (Signed)
 Medication refill request for Coreg 6.25 mg every day completed and sent to [facility] per pt's request.

## 2023-08-13 NOTE — Telephone Encounter (Signed)
 This is a [place] pt.

## 2023-08-13 NOTE — Telephone Encounter (Signed)
*  STAT* If patient is at the pharmacy, call can be transferred to refill team.   1. Which medications need to be refilled? (please list name of each medication and dose if known) carvedilol (COREG) 6.25 MG tablet  2. Which pharmacy/location (including street and city if local pharmacy) is medication to be sent to? [facility] [place], [place]  3. Do they need a 30 day or 90 day supply? 90 day supply

## 2023-09-20 ENCOUNTER — Ambulatory Visit: Payer: Medicare Other | Admitting: Internal Medicine

## 2023-09-20 ENCOUNTER — Ambulatory Visit (INDEPENDENT_AMBULATORY_CARE_PROVIDER_SITE_OTHER): Payer: Medicare Other | Admitting: Internal Medicine

## 2023-09-20 ENCOUNTER — Encounter: Payer: Self-pay | Admitting: Internal Medicine

## 2023-09-20 VITALS — BP 138/72 | HR 74 | Ht 72.0 in | Wt 174.0 lb

## 2023-09-20 DIAGNOSIS — E039 Hypothyroidism, unspecified: Secondary | ICD-10-CM | POA: Diagnosis not present

## 2023-09-20 DIAGNOSIS — M170 Bilateral primary osteoarthritis of knee: Secondary | ICD-10-CM

## 2023-09-20 DIAGNOSIS — D472 Monoclonal gammopathy: Secondary | ICD-10-CM

## 2023-09-20 DIAGNOSIS — I10 Essential (primary) hypertension: Secondary | ICD-10-CM

## 2023-09-20 DIAGNOSIS — E782 Mixed hyperlipidemia: Secondary | ICD-10-CM

## 2023-09-20 DIAGNOSIS — W19XXXA Unspecified fall, initial encounter: Secondary | ICD-10-CM

## 2023-09-20 DIAGNOSIS — N401 Enlarged prostate with lower urinary tract symptoms: Secondary | ICD-10-CM

## 2023-09-20 DIAGNOSIS — R7303 Prediabetes: Secondary | ICD-10-CM

## 2023-09-20 DIAGNOSIS — I2510 Atherosclerotic heart disease of native coronary artery without angina pectoris: Secondary | ICD-10-CM

## 2023-09-20 NOTE — Assessment & Plan Note (Signed)
 Followed by oncology Recently bone scan showed left humeral sclerotic lesion, repeat bone scan in 02/25 showed stable lesion

## 2023-09-20 NOTE — Assessment & Plan Note (Signed)
Tylenol PRN Avoid oral NSAIDs due to CAD Tramadol as needed for severe pain - refilled Referred to Orthopedic surgery

## 2023-09-20 NOTE — Assessment & Plan Note (Signed)
 BP Readings from Last 1 Encounters:  09/20/23 138/72   Overall well-controlled with Coreg 6.25 mg once daily (recommended by Cardiology) Counseled for compliance with the medications Advised DASH diet and moderate exercise/walking as tolerated

## 2023-09-20 NOTE — Patient Instructions (Signed)
 Please continue to take medications as prescribed.  Please continue to follow low salt diet and ambulate as tolerated with the help of a cane.

## 2023-09-20 NOTE — Assessment & Plan Note (Signed)
Has had prostate surgery, followed by Urology Was on Tamsulosin in the past

## 2023-09-20 NOTE — Progress Notes (Unsigned)
 Established Patient Office Visit  Subjective:  Patient ID: Jorge Bishop, male    DOB: 17-Nov-1935  Age: 88 y.o. MRN: [id number]  CC:  Chief Complaint  Patient presents with   Care Management    6 month f/u    HPI Jorge Bishop is a 88 y.o. male with past medical history of CAD s/p CABG, hypertension, HLD, prediabetes, hypothyroidism, MGUS, GERD, lumbar spinal stenosis s/p lumbar laminectomy and BPH who presents for f/u of his chronic medical conditions.  CAD s/p CABG and hypertension: BP is well-controlled. Takes Coreg 6.25 mg QD regularly. Patient denies headache, dizziness, chest pain, dyspnea or palpitations.  He follows up with cardiology.  He is on Coreg, aspirin and atorvastatin.   MGUS: He was diagnosed with MGUS, and had a skeletal survey done, which showed 7 mm sclerotic lesion over left humerus.  Followed by oncology currently.  Neuropathy: He was started on gabapentin, but he felt drowsiness with it. He had stomatitis and lip swelling with duloxetine, and has stopped taking it. He still complains of burning pain in his legs and feet.  Hypothyroidism: Takes levothyroxine 50 mcg QD.  Denies any recent change in weight or appetite.  Chronic constipation: He reports chronic constipation, but has improved with lactulose.  He denies any melena or hematochezia.  He used to strain hard while passing stool.  He has tried OTC stool softener-Dulcolax, MiraLAX, fiber supplement/Metamucil and probiotics without relief.  He has had to do manual disimpaction once as well.  Bilateral knee pain: He complains of bilateral knee pain with swelling.  His pain is worse upon standing and walking.  He has remote history of meniscal tear.  Denies any recent injury or fall.  He takes tramadol as needed for pain,  used to get it from [place], but requests it from [place] now.  Past Medical History:  Diagnosis Date   Arteriosclerotic cardiovascular disease (ASCVD)    Coronary artery bypass graft surgery in  12/98; DES to the RCA SVG in 07/2002; restenosis of the saphenous vein graft stent and renal intervention in 2006; 70% first diagonal present; calf in 11/2007-total obstruction of the saphenous vein graft to the first marginal   Benign prostatic hypertrophy    Cerebrovascular disease    60% bilateral internal carotid artery stenosis in 2012   Coronary artery disease    4 stents   Diaphragmatic hernia 01/15/2021   Diverticulosis    DJD (degenerative joint disease)    Erectile dysfunction    GERD (gastroesophageal reflux disease)    History of blood transfusion    History of kidney stones    Hyperlipidemia    Lipid profile in 12/2009:104, 91, 36, 50   Hypertension    Kidney stone 09/08/2017   Migraine headache    Right bundle branch block    Right ureteral stone 03/08/2019   Shortness of breath    Thrombocytopenia (HCC)    platelets of 104 in 03/2009   Upper gastrointestinal hemorrhage 10/09    Helicobacter pylori positive; subsequent laparoscopic fundoplication-2009    Past Surgical History:  Procedure Laterality Date   CARDIAC CATHETERIZATION     4 cardiac stents   CATARACT EXTRACTION W/PHACO Right 07/25/2012   Procedure: CATARACT EXTRACTION PHACO AND INTRAOCULAR LENS PLACEMENT (IOC);  Surgeon: Gemma Payor, MD;  Location: [facility];  Service: Ophthalmology;  Laterality: Right;  CDE:23.31   CATARACT EXTRACTION W/PHACO Left 08/18/2012   Procedure: CATARACT EXTRACTION PHACO AND INTRAOCULAR LENS PLACEMENT (IOC);  Surgeon: Gemma Payor,  MD;  Location: [facility];  Service: Ophthalmology;  Laterality: Left;  CDE: 16.07   COLONOSCOPY N/A 08/01/2014   Procedure: COLONOSCOPY;  Surgeon: Malissa Hippo, MD;  Location: [facility];  Service: Endoscopy;  Laterality: N/A;  125 - moved to 10:30 - Ann to notify pt   CORONARY ARTERY BYPASS GRAFT  1998    CYSTOSCOPY/URETEROSCOPY/HOLMIUM LASER/STENT PLACEMENT Right 11/12/2017   Procedure: CYSTOSCOPY/RIGHT RETROGRADE PYELOGRAM/RIGHT URETEROSCOPY/HOLMIUM LASER  LITHOTRIPSY RIGHT URETERAL CALCULUS/RIGHT URETERAL STENT PLACEMENT;  Surgeon: Bjorn Pippin, MD;  Location: [facility];  Service: Urology;  Laterality: Right;  right ureteral calculus given to patient's wife per Dr. Annabell Howells   CYSTOSCOPY/URETEROSCOPY/HOLMIUM LASER/STENT PLACEMENT Right 03/07/2019   Procedure: CYSTOSCOPY RIGHT RETROGRADE RIGHT URETEROSCOPY;  Surgeon: Bjorn Pippin, MD;  Location: [facility];  Service: Urology;  Laterality: Right;   EXTRACORPOREAL SHOCK WAVE LITHOTRIPSY Right 09/13/2017   Procedure: RIGHT EXTRACORPOREAL SHOCK WAVE LITHOTRIPSY (ESWL);  Surgeon: Crista Elliot, MD;  Location: [facility];  Service: Urology;  Laterality: Right;   HERNIA REPAIR     right inguinal   LAPAROSCOPIC NISSEN FUNDOPLICATION  1997, 2009    revision in 2009 along with repair of paraesophagel hernia    PARAESOPHAGEAL HERNIA REPAIR     TRANSURETHRAL RESECTION OF PROSTATE N/A 03/07/2019   Procedure: TRANSURETHRAL RESECTION OF THE PROSTATE (TURP);  Surgeon: Bjorn Pippin, MD;  Location: [facility];  Service: Urology;  Laterality: N/A;    Family History  Problem Relation Age of Onset   CAD Father     Social History   Socioeconomic History   Marital status: Married    Spouse name: Not on file   Number of children: Not on file   Years of education: Not on file   Highest education level: Not on file  Occupational History   Occupation: Full time    Employer: RETIRED  Tobacco Use   Smoking status: Never   Smokeless tobacco: Never  Vaping Use   Vaping status: Never Used  Substance and Sexual Activity   Alcohol use: No    Alcohol/week: 0.0 standard drinks of alcohol   Drug use: No   Sexual activity: Yes    Birth control/protection: None  Other Topics Concern   Not on file  Social History Narrative   Right handed   Drinks caffeine   Two story home   Social Drivers of Health   Financial Resource Strain: Low Risk  (02/17/2023)   Overall Financial Resource Strain (CARDIA)    Difficulty of Paying Living  Expenses: Not hard at all  Food Insecurity: No Food Insecurity (02/17/2023)   Hunger Vital Sign    Worried About Running Out of Food in the Last Year: Never true    Ran Out of Food in the Last Year: Never true  Transportation Needs: No Transportation Needs (02/17/2023)   PRAPARE - Administrator, Civil Service (Medical): No    Lack of Transportation (Non-Medical): No  Physical Activity: Sufficiently Active (02/17/2023)   Exercise Vital Sign    Days of Exercise per Week: 3 days    Minutes of Exercise per Session: 50 min  Stress: No Stress Concern Present (02/17/2023)   [employer] of Occupational Health - Occupational Stress Questionnaire    Feeling of Stress : Not at all  Social Connections: Unknown (02/17/2023)   Social Connection and Isolation Panel [NHANES]    Frequency of Communication with Friends and Family: Three times a week    Frequency of Social Gatherings with Friends and  Family: Once a week    Attends Religious Services: Never    Active Member of Clubs or Organizations: Patient declined    Attends Banker Meetings: Patient declined    Marital Status: Married  Catering manager Violence: Not At Risk (02/18/2023)   Humiliation, Afraid, Rape, and Kick questionnaire    Fear of Current or Ex-Partner: No    Emotionally Abused: No    Physically Abused: No    Sexually Abused: No    Outpatient Medications Prior to Visit  Medication Sig Dispense Refill   aspirin 81 MG tablet Take 81 mg by mouth daily.       atorvastatin (LIPITOR) 20 MG tablet Take 1 tablet (20 mg total) by mouth at bedtime. 90 tablet 3   carvedilol (COREG) 6.25 MG tablet Take 1 tablet (6.25 mg total) by mouth daily. 90 tablet 3   fluorometholone (FML) 0.1 % ophthalmic suspension SMARTSIG:1 Drop(s) In Eye(s) 2-4 Times Daily PRN     Lactulose 20 GM/30ML SOLN Take 30 mLs (20 g total) by mouth daily as needed (Constipation). 450 mL 3   levothyroxine (SYNTHROID) 50 MCG tablet TAKE 1 TABLET DAILY  BEFORE BREAKFAST 90 tablet 3   Multiple Vitamin (MULTIVITAMIN ADULT PO) Take by mouth.     ondansetron (ZOFRAN) 4 MG tablet Take 1 tablet (4 mg total) by mouth every 8 (eight) hours as needed for nausea or vomiting. 30 tablet 0   promethazine (PHENERGAN) 25 MG tablet Take 1 tablet (25 mg total) by mouth every 6 (six) hours as needed for nausea or vomiting. 30 tablet 0   traMADol (ULTRAM) 50 MG tablet Take 1 tablet (50 mg total) by mouth every 12 (twelve) hours as needed for moderate pain or severe pain (pain). 30 tablet 0   nitroGLYCERIN (NITROSTAT) 0.4 MG SL tablet Place 1 tablet (0.4 mg total) under the tongue every 5 (five) minutes as needed for chest pain. 30 tablet 0   cyanocobalamin 1000 MCG tablet Take 1,000 mcg by mouth daily. (Patient not taking: Reported on 09/20/2023)     No facility-administered medications prior to visit.    Allergies  Allergen Reactions   Bactrim [Sulfamethoxazole-Trimethoprim] Anaphylaxis   Clarithromycin Anaphylaxis    REACTION: UNKNOWN REACTION   Codeine Nausea And Vomiting   Erythromycin Anaphylaxis   Sulfa Antibiotics Anaphylaxis   Duloxetine     Lip swelling   Trimethoprim     ROS Review of Systems  Constitutional:  Negative for chills and fever.  HENT:  Negative for congestion and sore throat.   Eyes:  Negative for pain and discharge.  Respiratory:  Negative for cough and shortness of breath.   Cardiovascular:  Negative for chest pain and palpitations.  Gastrointestinal:  Positive for constipation. Negative for diarrhea, nausea and vomiting.  Endocrine: Negative for polydipsia and polyuria.  Genitourinary:  Negative for dysuria and hematuria.  Musculoskeletal:  Positive for arthralgias, back pain and gait problem. Negative for neck pain and neck stiffness.  Skin:  Negative for rash.  Neurological:  Positive for numbness. Negative for dizziness, weakness and headaches.  Psychiatric/Behavioral:  Negative for agitation and behavioral problems.        Objective:    Physical Exam Vitals reviewed.  Constitutional:      General: He is not in acute distress.    Appearance: He is not diaphoretic.  HENT:     Head: Normocephalic and atraumatic.     Nose: Nose normal.     Mouth/Throat:     Mouth:  Mucous membranes are moist.  Eyes:     General: No scleral icterus.    Extraocular Movements: Extraocular movements intact.  Cardiovascular:     Rate and Rhythm: Normal rate and regular rhythm.     Pulses: Normal pulses.     Heart sounds: Normal heart sounds. No murmur heard. Pulmonary:     Breath sounds: Normal breath sounds. No wheezing or rales.  Musculoskeletal:     Cervical back: Neck supple. No tenderness.     Right knee: Swelling present. Decreased range of motion.     Left knee: Swelling present. Decreased range of motion.     Right lower leg: No edema.     Left lower leg: No edema.  Skin:    General: Skin is warm and dry.     Findings: No rash.  Neurological:     General: No focal deficit present.     Mental Status: He is alert and oriented to person, place, and time.     Sensory: Sensory deficit (B/l feet) present.     Motor: Weakness (B/l LE) present.     Gait: Gait abnormal.  Psychiatric:        Mood and Affect: Mood normal.        Behavior: Behavior normal.     BP 138/72   Pulse 74   Ht 6' (1.829 m)   Wt 174 lb (78.9 kg)   SpO2 98%   BMI 23.60 kg/m  Wt Readings from Last 3 Encounters:  09/20/23 174 lb (78.9 kg)  08/03/23 173 lb (78.5 kg)  03/22/23 168 lb 12.8 oz (76.6 kg)    Lab Results  Component Value Date   TSH 2.794 01/26/2023   Lab Results  Component Value Date   WBC 6.4 07/27/2023   HGB 15.1 07/27/2023   HCT 46.9 07/27/2023   MCV 94.0 07/27/2023   PLT 129 (L) 07/27/2023   Lab Results  Component Value Date   NA 143 07/27/2023   K 4.5 07/27/2023   CO2 29 07/27/2023   GLUCOSE 83 07/27/2023   BUN 25 (H) 07/27/2023   CREATININE 0.89 07/27/2023   BILITOT 1.2 07/27/2023   ALKPHOS 62  07/27/2023   AST 29 07/27/2023   ALT 30 07/27/2023   PROT 7.3 07/27/2023   ALBUMIN 4.1 07/27/2023   CALCIUM 9.4 07/27/2023   ANIONGAP 7 07/27/2023   EGFR 80 04/07/2022   Lab Results  Component Value Date   CHOL 131 04/07/2022   Lab Results  Component Value Date   HDL 29 (L) 04/07/2022   Lab Results  Component Value Date   LDLCALC 77 04/07/2022   Lab Results  Component Value Date   TRIG 143 04/07/2022   Lab Results  Component Value Date   CHOLHDL 4.5 04/07/2022   Lab Results  Component Value Date   HGBA1C 5.9 (H) 09/15/2021      Assessment & Plan:   Problem List Items Addressed This Visit   None   No orders of the defined types were placed in this encounter.   Follow-up: No follow-ups on file.    Anabel Halon, MD

## 2023-09-20 NOTE — Assessment & Plan Note (Signed)
CABG in 12/98; DES to the RCA SVG in 07/2002; restenosis SVG stent and re-intervention in 2006; 70% residual D1; cath in 11/2007: TO SVG M1  Followed by Cardiology On Aspirin, statin and Coreg Has NTG PRN, does not have any anginal symptoms currently

## 2023-09-20 NOTE — Assessment & Plan Note (Signed)
On Levothyroxine 50 mcg QD ?Last TSH - wnl ? ?

## 2023-09-21 ENCOUNTER — Encounter: Payer: Self-pay | Admitting: Internal Medicine

## 2023-09-21 DIAGNOSIS — R7303 Prediabetes: Secondary | ICD-10-CM | POA: Insufficient documentation

## 2023-09-21 DIAGNOSIS — W19XXXA Unspecified fall, initial encounter: Secondary | ICD-10-CM | POA: Insufficient documentation

## 2023-09-21 LAB — HEMOGLOBIN A1C
Est. average glucose Bld gHb Est-mCnc: 126 mg/dL
Hgb A1c MFr Bld: 6 % — ABNORMAL HIGH (ref 4.8–5.6)

## 2023-09-21 LAB — TSH+FREE T4
Free T4: 1.2 ng/dL (ref 0.82–1.77)
TSH: 2.07 u[IU]/mL (ref 0.450–4.500)

## 2023-09-21 LAB — LIPID PANEL
Chol/HDL Ratio: 4.3 ratio (ref 0.0–5.0)
Cholesterol, Total: 142 mg/dL (ref 100–199)
HDL: 33 mg/dL — ABNORMAL LOW (ref >39)
LDL Chol Calc (NIH): 89 mg/dL (ref 0–99)
Triglycerides: 105 mg/dL (ref 0–149)
VLDL Cholesterol Cal: 20 mg/dL (ref 5–40)

## 2023-09-21 NOTE — Assessment & Plan Note (Signed)
 Likely mechanical fall Uses cane for walking support No bruising noted on back area today

## 2023-09-21 NOTE — Assessment & Plan Note (Signed)
 Lab Results  Component Value Date   HGBA1C 6.0 (H) 09/20/2023   Advised to follow low carb diet

## 2023-09-21 NOTE — Assessment & Plan Note (Signed)
 On statin Check lipid profile

## 2023-10-12 ENCOUNTER — Ambulatory Visit (INDEPENDENT_AMBULATORY_CARE_PROVIDER_SITE_OTHER): Admitting: Neurology

## 2023-10-12 ENCOUNTER — Encounter: Payer: Self-pay | Admitting: Neurology

## 2023-10-12 VITALS — BP 147/70 | HR 73 | Ht 72.0 in | Wt 174.0 lb

## 2023-10-12 DIAGNOSIS — G629 Polyneuropathy, unspecified: Secondary | ICD-10-CM | POA: Diagnosis not present

## 2023-10-12 NOTE — Progress Notes (Signed)
 Follow-up Visit   Date: 10/12/2023    Jorge Bishop MRN: [id number] DOB: April 07, 1936    Jorge Bishop is a 88 y.o. right-handed Caucasian male with CAD s/p CABG, and PCI, BPH, ED, GERD, hyperlipidemia, hypertension, hypothyroidism, s/p lumbar surgery, MGUS, and prediabetes returning to the clinic for follow-up of neuropathy.  The patient was accompanied to the clinic by self.  IMPRESSION/PLAN: Peripheral neuropathy manifesting with distal paresthesias and sensory ataxia,  progressive as expected with the condition.  Patient had exposure to Agent Orange in [place]; however, develops symptoms of neuropathy in early 1990s.  Typically toxic neuropathies will develops during time of exposure or within the first year.  His neuropathy work-up did show MGUS and he is followed by hematology for this.   Neuropathy  management was discussed and remains supportive.  There is no cure and I am not familiar with some of the therapies that he had researched online.  I informed patient that there is no treatment for numbness.  Medications such as gabapentin  and duloxetine  (which he did not tolerate) are used for pain.  For imbalance, there is no treatment. He tried PT with no benefit.  Fall precautions discussed.  Continue to use a cane.   Return to clinic as needed  --------------------------------------------- History of present illness: Initial visit 08/08/2021: He has a long history of bilateral weakness, numbness, and tingling in the legs that started in early 1990s.  Symptoms have slowly been progressive.  He underwent lumbar surgery, but reports no change in symptoms.  He has been on epidural steroid injection for back pain in the past. He complains of inability of getting up from a squatting position or a low chair.   He also complains of tingling and numbness in the feet and legs up to the knees.  He has has numbness involving the fingertips.  Symptoms are constant, but some times he notices it less.   He was given gabapentin  300mg  TID, but stopped it due to nausea.  He has a prescription for gabapentin  100mg , which he has not started. He has imbalance, walks unassisted and has fallen three times in the past year.  He has completed PT for gait which provided short-term improvement, but nothing which has alleviated his symptoms.   Mother had neuropathy who developed it in her 66s.  No history chemotherapy exposure.     He is retired from [employer]  He lives with wife in a two level home.    UPDATE 10/12/2023:  He is here for follow-up visit.  He reports that his neuropathy has steadily progressed, especially affecting his balance.  He has started to use a cane when outside of the home.  He has had three falls.  He continues to have numbness in the lower legs and feet and episodic sharp shooting pain in the toes, which can occur randomly every 2-3 days.  He did not tolerate gabapentin  or duloxetine .  He has questions regarding other medications and therapies.  He also was exposed to Agent Orange in [place] and wondering if this could cause his neuropathy.   Medications:  Current Outpatient Medications on File Prior to Visit  Medication Sig Dispense Refill   aspirin  81 MG tablet Take 81 mg by mouth daily.       atorvastatin  (LIPITOR) 20 MG tablet Take 1 tablet (20 mg total) by mouth at bedtime. 90 tablet 3   carvedilol  (COREG ) 6.25 MG tablet Take 1 tablet (6.25  mg total) by mouth daily. 90 tablet 3   fluorometholone (FML) 0.1 % ophthalmic suspension SMARTSIG:1 Drop(s) In Eye(s) 2-4 Times Daily PRN     Lactulose  20 GM/30ML SOLN Take 30 mLs (20 g total) by mouth daily as needed (Constipation). 450 mL 3   levothyroxine  (SYNTHROID ) 50 MCG tablet TAKE 1 TABLET DAILY BEFORE BREAKFAST 90 tablet 3   Multiple Vitamin (MULTIVITAMIN ADULT PO) Take by mouth.     nitroGLYCERIN  (NITROSTAT ) 0.4 MG SL tablet Place 1 tablet (0.4 mg total) under the tongue every 5 (five) minutes as  needed for chest pain. 30 tablet 0   ondansetron  (ZOFRAN ) 4 MG tablet Take 1 tablet (4 mg total) by mouth every 8 (eight) hours as needed for nausea or vomiting. 30 tablet 0   promethazine  (PHENERGAN ) 25 MG tablet Take 1 tablet (25 mg total) by mouth every 6 (six) hours as needed for nausea or vomiting. 30 tablet 0   traMADol  (ULTRAM ) 50 MG tablet Take 1 tablet (50 mg total) by mouth every 12 (twelve) hours as needed for moderate pain or severe pain (pain). 30 tablet 0   No current facility-administered medications on file prior to visit.    Allergies:  Allergies  Allergen Reactions   Bactrim [Sulfamethoxazole-Trimethoprim] Anaphylaxis   Clarithromycin Anaphylaxis    REACTION: UNKNOWN REACTION   Codeine Nausea And Vomiting   Erythromycin Anaphylaxis   Sulfa Antibiotics Anaphylaxis   Duloxetine      Lip swelling   Trimethoprim     Vital Signs:  BP (!) 147/70   Pulse 73   Ht 6' (1.829 m)   Wt 174 lb (78.9 kg)   SpO2 100%   BMI 23.60 kg/m   Neurological Exam: MENTAL STATUS including orientation to time, place, person, recent and remote memory, attention span and concentration, language, and fund of knowledge is normal.  Speech is not dysarthric.  CRANIAL NERVES:  No visual field defects.  Pupils equal round and reactive to light.  Normal conjugate, extra-ocular eye movements in all directions of gaze.  No ptosis.  Face is symmetric.   MOTOR:  Motor strength is 5/5 in all extremities, except distally in the feet.  No atrophy, fasciculations or abnormal movements.  No pronator drift.  Tone is normal.    MSRs:  Reflexes are 2+/4 throughout, except absent at the ankles.  SENSORY:  Intact to vibration throughout at the MCP and right knee, absent at the left knee and at the ankles bilaterally.  Pin prick and temperature reduced from mid-calf distally. Aaron Aas  COORDINATION/GAIT:  Normal finger-to- nose-finger.  Intact rapid alternating movements bilaterally.  Gait wide-based and stable.    Data: MRI cervical spine wo contrast 01/26/2019: Cervical spine spondylosis most notable at C3-C4 with a central disc protrusion causing mild central canal stenosis and moderate to severe left neural foraminal narrowing.   MRI lumbar spine wo contrast 12/09/2018: 1. Mild lumbar spine spondylosis as described above without significant interval change compared with 10/04/2017.   Total time spent reviewing records, interview, history/exam, documentation, and coordination of care on day of encounter:  30 minutes   Thank you for allowing me to participate in patient's care.  If I can answer any additional questions, I would be pleased to do so.    Sincerely,    Laporscha Linehan K. Lydia Sams, DO

## 2023-10-13 ENCOUNTER — Encounter: Payer: Self-pay | Admitting: Orthopedic Surgery

## 2023-10-13 ENCOUNTER — Ambulatory Visit: Admitting: Orthopedic Surgery

## 2023-10-13 VITALS — BP 130/62 | HR 77 | Ht 72.0 in | Wt 172.0 lb

## 2023-10-13 DIAGNOSIS — M170 Bilateral primary osteoarthritis of knee: Secondary | ICD-10-CM

## 2023-10-13 MED ORDER — METHYLPREDNISOLONE ACETATE 40 MG/ML IJ SUSP
40.0000 mg | Freq: Once | INTRAMUSCULAR | Status: AC
  Administered 2023-10-13: 40 mg via INTRA_ARTICULAR

## 2023-10-13 NOTE — Addendum Note (Signed)
 Addended by: Denver Flaming on: 10/13/2023 03:38 PM   Modules accepted: Orders

## 2023-10-13 NOTE — Patient Instructions (Signed)

## 2023-10-13 NOTE — Progress Notes (Signed)
 Return Patient Visit  Assessment: Jorge Bishop is a 88 y.o. male with the following: Bilateral knee arthritis  Plan: Jorge Bishop has arthritis in both knees.  I previously seen him and injected his knees with hyaluronic acid.  These provided excellent relief.  He is having discomfort today, would like to proceed with steroid injections.  They were completed in clinic today.  He will follow-up as needed.  Procedure note injection Right knee joint   Verbal consent was obtained to inject the right knee joint  Timeout was completed to confirm the site of injection.  The skin was prepped with alcohol and ethyl chloride was sprayed at the injection site.  A 21-gauge needle was used to inject 40 mg of Depo-Medrol and 1% lidocaine  (4 cc) into the right knee using an anterolateral approach.  There were no complications. A sterile bandage was applied.   Procedure note injection Left knee joint   Verbal consent was obtained to inject the left knee joint  Timeout was completed to confirm the site of injection.  The skin was prepped with alcohol and ethyl chloride was sprayed at the injection site.  A 21-gauge needle was used to inject 40 mg of Depo-Medrol and 1% lidocaine  (4 cc) into the left knee using an anterolateral approach.  There were no complications. A sterile bandage was applied.      Follow-up: Return if symptoms worsen or fail to improve.  Subjective:  Chief Complaint  Patient presents with   Knee Pain    Bilateral     History of Present Illness: Jorge Bishop is a 88 y.o. male who returns to clinic for evaluation of bilateral knee pain.  I have seen him several times for bilateral knee pain.  Most recent injections were hyaluronic acid.  These provided excellent relief.  The pain is starting to return.  He continues to ambulate with a cane.  He is interested in steroid injections.  Review of Systems: No fevers or chills No numbness or tingling No chest pain No  shortness of breath No bowel or bladder dysfunction No GI distress No headaches   Objective: BP 130/62   Pulse 77   Ht 6' (1.829 m)   Wt 172 lb (78 kg)   BMI 23.33 kg/m   Physical Exam:  General: Elderly male., Alert and oriented., and No acute distress. Gait: Slow, steady gait.  Ambulating with the assistance of a cane.  Bilateral knees without swelling.  He has good range of motion.  No increased laxity to varus or valgus stress.  Tenderness palpation along the medial joint line.  Negative Lachman bilaterally.  IMAGING: No new imaging obtained today     New Medications:  No orders of the defined types were placed in this encounter.     Tonita Frater, MD  10/13/2023 2:16 PM

## 2023-10-21 ENCOUNTER — Ambulatory Visit: Payer: Medicare Other | Admitting: Urology

## 2023-11-11 ENCOUNTER — Encounter: Payer: Self-pay | Admitting: Urology

## 2023-11-11 ENCOUNTER — Ambulatory Visit (INDEPENDENT_AMBULATORY_CARE_PROVIDER_SITE_OTHER): Payer: Medicare Other | Admitting: Urology

## 2023-11-11 VITALS — BP 122/60 | HR 77

## 2023-11-11 DIAGNOSIS — N3941 Urge incontinence: Secondary | ICD-10-CM

## 2023-11-11 DIAGNOSIS — N3281 Overactive bladder: Secondary | ICD-10-CM

## 2023-11-11 DIAGNOSIS — Z87442 Personal history of urinary calculi: Secondary | ICD-10-CM

## 2023-11-11 NOTE — Progress Notes (Unsigned)
 Subjective:  1. Urge incontinence   2. History of nephrolithiasis    11/11/23: Jorge Bishop returns today in f/u from history of BPH with BOO with a prior TURP and URS in 9/22.  He has a history of urgency with UUI but is no longer on myrbetriq .  He has had a couple of accidents but none in about 8 months. HIs IPSS is 16 with urgency and a reduced stream.  HE has no nocturia.  He has had no hematuria but he has had some pain following a fall on his right side.  He had a bone survey in 2/25 and the abdominal films showed no stones.  His PSA was 1.2 in 5/24.    10/15/22: Jorge Bishop returns today in f/u.  He has had a good response to the Myrbetriq  25mg  and has reduced urgency and no nocturia.   He had a CT that showed some stable intramuscular fat in the right upper quadrant that is stable for several years and no stones are noted.  UA is clear and his PVR is 6ml.  09/03/22: Jorge Bishop was last seen on 09/22/19 for his history of BPH with BOO with a prior TURP and right URS for a stone in 9/22. He had ESWL in 2019.  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   He is back now with some increased irritative symptoms.  His IPSS is 10 with urgency and some UUI.  He has a reduced stream and nocturia x 1. He is wearing a liner but doesn't have to change often. He has had no flank pain or hematuria.  He has some right flank pain/back pain intermittently for the last few years.  It is worse in the AM.  His PVR is .   IPSS     Row Name 11/11/23 1500         International Prostate Symptom Score   How often have you had the sensation of not emptying your bladder? Less than half the time     How often have you had to urinate less than every two hours? Not at All     How often have you found you stopped and started again several times when you urinated? More than half the time     How often have you found it difficult to postpone  urination? Almost always     How often have you had a weak urinary stream? About half the time     How often have you had to strain to start urination? Less than half the time     How many times did you typically get up at night to urinate? None     Total IPSS Score 16       Quality of Life due to urinary symptoms   If you were to spend the rest of your life with your urinary condition just the way it is now how would you feel about that? Mostly Disatisfied                ROS:  ROS:  A complete review of systems was performed.  All systems are negative except for pertinent findings as noted.   Review of Systems  Musculoskeletal:  Positive for back pain.  Endo/Heme/Allergies:  Bruises/bleeds easily.    Allergies  Allergen Reactions   Bactrim [Sulfamethoxazole-Trimethoprim] Anaphylaxis   Clarithromycin Anaphylaxis  REACTION: UNKNOWN REACTION   Codeine Nausea And Vomiting   Erythromycin Anaphylaxis   Sulfa Antibiotics Anaphylaxis   Duloxetine      Lip swelling   Trimethoprim     Outpatient Encounter Medications as of 11/11/2023  Medication Sig   aspirin  81 MG tablet Take 81 mg by mouth daily.     atorvastatin  (LIPITOR) 20 MG tablet Take 1 tablet (20 mg total) by mouth at bedtime.   carvedilol  (COREG ) 6.25 MG tablet Take 1 tablet (6.25 mg total) by mouth daily.   fluorometholone (FML) 0.1 % ophthalmic suspension SMARTSIG:1 Drop(s) In Eye(s) 2-4 Times Daily PRN   Lactulose  20 GM/30ML SOLN Take 30 mLs (20 g total) by mouth daily as needed (Constipation).   levothyroxine  (SYNTHROID ) 50 MCG tablet TAKE 1 TABLET DAILY BEFORE BREAKFAST   Multiple Vitamin (MULTIVITAMIN ADULT PO) Take by mouth.   ondansetron  (ZOFRAN ) 4 MG tablet Take 1 tablet (4 mg total) by mouth every 8 (eight) hours as needed for nausea or vomiting.   promethazine  (PHENERGAN ) 25 MG tablet Take 1 tablet (25 mg total) by mouth every 6 (six) hours as needed for nausea or vomiting.   traMADol  (ULTRAM ) 50 MG  tablet Take 1 tablet (50 mg total) by mouth every 12 (twelve) hours as needed for moderate pain or severe pain (pain).   nitroGLYCERIN  (NITROSTAT ) 0.4 MG SL tablet Place 1 tablet (0.4 mg total) under the tongue every 5 (five) minutes as needed for chest pain.   No facility-administered encounter medications on file as of 11/11/2023.    Past Medical History:  Diagnosis Date   Arteriosclerotic cardiovascular disease (ASCVD)    Coronary artery bypass graft surgery in 12/98; DES to the RCA SVG in 07/2002; restenosis of the saphenous vein graft stent and renal intervention in 2006; 70% first diagonal present; calf in 11/2007-total obstruction of the saphenous vein graft to the first marginal   Benign prostatic hypertrophy    Cerebrovascular disease    60% bilateral internal carotid artery stenosis in 2012   Coronary artery disease    4 stents   Diaphragmatic hernia 01/15/2021   Diverticulosis    DJD (degenerative joint disease)    Erectile dysfunction    GERD (gastroesophageal reflux disease)    History of blood transfusion    History of kidney stones    Hyperlipidemia    Lipid profile in 12/2009:104, 91, 36, 50   Hypertension    Kidney stone 09/08/2017   Migraine headache    Right bundle branch block    Right ureteral stone 03/08/2019   Shortness of breath    Thrombocytopenia (HCC)    platelets of 104 in 03/2009   Upper gastrointestinal hemorrhage 10/09    Helicobacter pylori positive; subsequent laparoscopic fundoplication-2009    Past Surgical History:  Procedure Laterality Date   CARDIAC CATHETERIZATION     4 cardiac stents   CATARACT EXTRACTION W/PHACO Right 07/25/2012   Procedure: CATARACT EXTRACTION PHACO AND INTRAOCULAR LENS PLACEMENT (IOC);  Surgeon: Anner Kill, MD;  Location: [facility];  Service: Ophthalmology;  Laterality: Right;  CDE:23.31   CATARACT EXTRACTION W/PHACO Left 08/18/2012   Procedure: CATARACT EXTRACTION PHACO AND INTRAOCULAR LENS PLACEMENT (IOC);  Surgeon: Anner Kill, MD;  Location: [facility];  Service: Ophthalmology;  Laterality: Left;  CDE: 16.07   COLONOSCOPY N/A 08/01/2014   Procedure: COLONOSCOPY;  Surgeon: Ruby Corporal, MD;  Location: [facility];  Service: Endoscopy;  Laterality: N/A;  125 - moved to 10:30 - Ann to notify pt  CORONARY ARTERY BYPASS GRAFT  1998    CYSTOSCOPY/URETEROSCOPY/HOLMIUM LASER/STENT PLACEMENT Right 11/12/2017   Procedure: CYSTOSCOPY/RIGHT RETROGRADE PYELOGRAM/RIGHT URETEROSCOPY/HOLMIUM LASER LITHOTRIPSY RIGHT URETERAL CALCULUS/RIGHT URETERAL STENT PLACEMENT;  Surgeon: Homero Luster, MD;  Location: [facility];  Service: Urology;  Laterality: Right;  right ureteral calculus given to patient's wife per Dr. Inga Manges   CYSTOSCOPY/URETEROSCOPY/HOLMIUM LASER/STENT PLACEMENT Right 03/07/2019   Procedure: CYSTOSCOPY RIGHT RETROGRADE RIGHT URETEROSCOPY;  Surgeon: Homero Luster, MD;  Location: [facility];  Service: Urology;  Laterality: Right;   EXTRACORPOREAL SHOCK WAVE LITHOTRIPSY Right 09/13/2017   Procedure: RIGHT EXTRACORPOREAL SHOCK WAVE LITHOTRIPSY (ESWL);  Surgeon: Samson Croak, MD;  Location: [facility];  Service: Urology;  Laterality: Right;   HERNIA REPAIR     right inguinal   LAPAROSCOPIC NISSEN FUNDOPLICATION  1997, 2009    revision in 2009 along with repair of paraesophagel hernia    PARAESOPHAGEAL HERNIA REPAIR     TRANSURETHRAL RESECTION OF PROSTATE N/A 03/07/2019   Procedure: TRANSURETHRAL RESECTION OF THE PROSTATE (TURP);  Surgeon: Homero Luster, MD;  Location: [facility];  Service: Urology;  Laterality: N/A;    Social History   Socioeconomic History   Marital status: Married    Spouse name: Not on file   Number of children: Not on file   Years of education: Not on file   Highest education level: Not on file  Occupational History   Occupation: Full time    Employer: RETIRED  Tobacco Use   Smoking status: Never   Smokeless tobacco: Never  Vaping Use   Vaping status: Never Used  Substance and Sexual Activity   Alcohol use:  No    Alcohol/week: 0.0 standard drinks of alcohol   Drug use: No   Sexual activity: Yes    Birth control/protection: None  Other Topics Concern   Not on file  Social History Narrative   Right handed   Drinks caffeine   Two story home   Social Drivers of Health   Financial Resource Strain: Low Risk  (02/17/2023)   Overall Financial Resource Strain (CARDIA)    Difficulty of Paying Living Expenses: Not hard at all  Food Insecurity: No Food Insecurity (02/17/2023)   Hunger Vital Sign    Worried About Running Out of Food in the Last Year: Never true    Ran Out of Food in the Last Year: Never true  Transportation Needs: No Transportation Needs (02/17/2023)   PRAPARE - Administrator, Civil Service (Medical): No    Lack of Transportation (Non-Medical): No  Physical Activity: Sufficiently Active (02/17/2023)   Exercise Vital Sign    Days of Exercise per Week: 3 days    Minutes of Exercise per Session: 50 min  Stress: No Stress Concern Present (02/17/2023)   [employer] of Occupational Health - Occupational Stress Questionnaire    Feeling of Stress : Not at all  Social Connections: Unknown (02/17/2023)   Social Connection and Isolation Panel [NHANES]    Frequency of Communication with Friends and Family: Three times a week    Frequency of Social Gatherings with Friends and Family: Once a week    Attends Religious Services: Never    Database administrator or Organizations: Patient declined    Attends Banker Meetings: Patient declined    Marital Status: Married  Catering manager Violence: Not At Risk (02/18/2023)   Humiliation, Afraid, Rape, and Kick questionnaire    Fear of Current or Ex-Partner: No    Emotionally Abused: No  Physically Abused: No    Sexually Abused: No    Family History  Problem Relation Age of Onset   CAD Father        Objective: Vitals:   11/11/23 1507  BP: 122/60  Pulse: 77     Physical Exam Vitals reviewed.   Constitutional:      Appearance: Normal appearance.  Neurological:     Mental Status: He is alert.     Lab Results:  PSA No results found for: "PSA" No results found for: "TESTOSTERONE" UA is clear.    Studies/Results: No results found. No results found.  No results found for this or any previous visit.  No results found for this or any previous visit.  No results found for this or any previous visit.    No valid procedures specified. No results found for this or any previous visit. Bone series films reviewed.     Assessment & Plan: He has OAB wet but no recent accidents.       History of stones.  He had no stones on recent imaging.  No orders of the defined types were placed in this encounter.    Orders Placed This Encounter  Procedures   Urinalysis, Routine w reflex microscopic      Return if symptoms worsen or fail to improve.   CC: Meldon Sport, MD      Homero Luster 5/29/2025Patient ID: Jorge Bishop, male   DOB: 1936-01-20, 88 y.o.   MRN: [id number]

## 2023-11-12 LAB — URINALYSIS, ROUTINE W REFLEX MICROSCOPIC
Bilirubin, UA: NEGATIVE
Glucose, UA: NEGATIVE
Ketones, UA: NEGATIVE
Leukocytes,UA: NEGATIVE
Nitrite, UA: NEGATIVE
Protein,UA: NEGATIVE
RBC, UA: NEGATIVE
Specific Gravity, UA: 1.01 (ref 1.005–1.030)
Urobilinogen, Ur: 1 mg/dL (ref 0.2–1.0)
pH, UA: 6 (ref 5.0–7.5)

## 2023-12-28 ENCOUNTER — Ambulatory Visit: Admitting: Orthopedic Surgery

## 2024-01-06 ENCOUNTER — Encounter: Payer: Self-pay | Admitting: Internal Medicine

## 2024-01-18 ENCOUNTER — Ambulatory Visit (INDEPENDENT_AMBULATORY_CARE_PROVIDER_SITE_OTHER): Admitting: Orthopedic Surgery

## 2024-01-18 DIAGNOSIS — M170 Bilateral primary osteoarthritis of knee: Secondary | ICD-10-CM | POA: Diagnosis not present

## 2024-01-18 MED ORDER — METHYLPREDNISOLONE ACETATE 40 MG/ML IJ SUSP
40.0000 mg | Freq: Once | INTRAMUSCULAR | Status: AC
  Administered 2024-01-18: 40 mg via INTRA_ARTICULAR

## 2024-01-18 NOTE — Patient Instructions (Signed)

## 2024-01-18 NOTE — Progress Notes (Signed)
 Jorge Bishop

## 2024-01-18 NOTE — Progress Notes (Signed)
 Return Patient Visit  Assessment: Jorge Bishop is a 88 y.o. male with the following: Bilateral knee arthritis  Plan: Jorge Bishop has arthritis in both knees.  He continues to have good relief following injections.  He has no interest in surgery.  He would like to continue with injections.  These were completed in clinic today.  Follow-up as needed, but not earlier than 3 months.  Procedure note injection Right knee joint   Verbal consent was obtained to inject the right knee joint  Timeout was completed to confirm the site of injection.  The skin was prepped with alcohol and ethyl chloride was sprayed at the injection site.  A 21-gauge needle was used to inject 40 mg of Depo-Medrol  and 1% lidocaine  (4 cc) into the right knee using an anterolateral approach.  There were no complications. A sterile bandage was applied.   Procedure note injection Left knee joint   Verbal consent was obtained to inject the left knee joint  Timeout was completed to confirm the site of injection.  The skin was prepped with alcohol and ethyl chloride was sprayed at the injection site.  A 21-gauge needle was used to inject 40 mg of Depo-Medrol  and 1% lidocaine  (4 cc) into the left knee using an anterolateral approach.  There were no complications. A sterile bandage was applied.      Follow-up: Return if symptoms worsen or fail to improve.  Subjective:  Chief Complaint  Patient presents with   Knee Pain    Both wants injections     History of Present Illness: Jorge Bishop is a 88 y.o. male who returns to clinic for evaluation of bilateral knee pain.  We have completed multiple injections.  Most recent injections were very helpful.  They do not last for 3 months.  He would like to repeat injections today.   Review of Systems: No fevers or chills No numbness or tingling No chest pain No shortness of breath No bowel or bladder dysfunction No GI distress No headaches   Objective: There  were no vitals taken for this visit.  Physical Exam:  General: Elderly male., Alert and oriented., and No acute distress. Gait: Slow, steady gait.  Ambulating with the assistance of a cane.  Bilateral knees without swelling.  He has good range of motion.  No increased laxity to varus or valgus stress.  Tenderness palpation along the medial joint line.  Negative Lachman bilaterally.  IMAGING: No new imaging obtained today     New Medications:  Meds ordered this encounter  Medications   methylPREDNISolone  acetate (DEPO-MEDROL ) injection 40 mg   methylPREDNISolone  acetate (DEPO-MEDROL ) injection 40 mg      Jorge DELENA Horde, MD  01/18/2024 2:02 PM

## 2024-01-26 ENCOUNTER — Inpatient Hospital Stay: Payer: Medicare Other | Attending: Physician Assistant

## 2024-01-26 DIAGNOSIS — D472 Monoclonal gammopathy: Secondary | ICD-10-CM | POA: Diagnosis present

## 2024-01-26 DIAGNOSIS — D696 Thrombocytopenia, unspecified: Secondary | ICD-10-CM | POA: Diagnosis not present

## 2024-01-26 LAB — CBC WITH DIFFERENTIAL/PLATELET
Abs Immature Granulocytes: 0.02 K/uL (ref 0.00–0.07)
Basophils Absolute: 0 K/uL (ref 0.0–0.1)
Basophils Relative: 0 %
Eosinophils Absolute: 0.1 K/uL (ref 0.0–0.5)
Eosinophils Relative: 2 %
HCT: 49 % (ref 39.0–52.0)
Hemoglobin: 16 g/dL (ref 13.0–17.0)
Immature Granulocytes: 0 %
Lymphocytes Relative: 27 %
Lymphs Abs: 1.9 K/uL (ref 0.7–4.0)
MCH: 31.1 pg (ref 26.0–34.0)
MCHC: 32.7 g/dL (ref 30.0–36.0)
MCV: 95.1 fL (ref 80.0–100.0)
Monocytes Absolute: 0.6 K/uL (ref 0.1–1.0)
Monocytes Relative: 9 %
Neutro Abs: 4.4 K/uL (ref 1.7–7.7)
Neutrophils Relative %: 62 %
Platelets: 126 K/uL — ABNORMAL LOW (ref 150–400)
RBC: 5.15 MIL/uL (ref 4.22–5.81)
RDW: 13 % (ref 11.5–15.5)
WBC: 7 K/uL (ref 4.0–10.5)
nRBC: 0 % (ref 0.0–0.2)

## 2024-01-26 LAB — COMPREHENSIVE METABOLIC PANEL WITH GFR
ALT: 36 U/L (ref 0–44)
AST: 32 U/L (ref 15–41)
Albumin: 4.1 g/dL (ref 3.5–5.0)
Alkaline Phosphatase: 62 U/L (ref 38–126)
Anion gap: 10 (ref 5–15)
BUN: 20 mg/dL (ref 8–23)
CO2: 25 mmol/L (ref 22–32)
Calcium: 9.1 mg/dL (ref 8.9–10.3)
Chloride: 105 mmol/L (ref 98–111)
Creatinine, Ser: 0.85 mg/dL (ref 0.61–1.24)
GFR, Estimated: >60 mL/min (ref >60)
Glucose, Bld: 135 mg/dL — ABNORMAL HIGH (ref 70–99)
Potassium: 4.8 mmol/L (ref 3.5–5.1)
Sodium: 140 mmol/L (ref 135–145)
Total Bilirubin: 1.2 mg/dL (ref 0.0–1.2)
Total Protein: 7.4 g/dL (ref 6.5–8.1)

## 2024-01-26 LAB — LACTATE DEHYDROGENASE: LDH: 169 U/L (ref 98–192)

## 2024-01-27 LAB — KAPPA/LAMBDA LIGHT CHAINS
Kappa free light chain: 30.5 mg/L — ABNORMAL HIGH (ref 3.3–19.4)
Kappa, lambda light chain ratio: 1.83 — ABNORMAL HIGH (ref 0.26–1.65)
Lambda free light chains: 16.7 mg/L (ref 5.7–26.3)

## 2024-01-28 LAB — PROTEIN ELECTROPHORESIS, SERUM
A/G Ratio: 1.4 (ref 0.7–1.7)
Albumin ELP: 4 g/dL (ref 2.9–4.4)
Alpha-1-Globulin: 0.2 g/dL (ref 0.0–0.4)
Alpha-2-Globulin: 0.7 g/dL (ref 0.4–1.0)
Beta Globulin: 0.9 g/dL (ref 0.7–1.3)
Gamma Globulin: 1.1 g/dL (ref 0.4–1.8)
Globulin, Total: 2.9 g/dL (ref 2.2–3.9)
M-Spike, %: 0.7 g/dL — ABNORMAL HIGH
Total Protein ELP: 6.9 g/dL (ref 6.0–8.5)

## 2024-02-01 NOTE — Progress Notes (Unsigned)
 [facility] [address], [place] [address]   CLINIC:  Medical Oncology/Hematology  PCP:  Tobie Suzzane POUR, MD [address] [place] [address] [phone]   REASON FOR VISIT:  Follow-up for MGUS + thrombocytopenia + sclerotic lesion of left humerus  PRIOR THERAPY: None  CURRENT THERAPY: Active surveillance  INTERVAL HISTORY:   Jorge Bishop 88 y.o. male returns for routine follow-up of MGUS, thrombocytopenia, and sclerotic lesion of left humerus.  He was last seen by Pleasant Barefoot PA-C on 08/03/2023.  At today's visit, he reports feeling fairly well.  MGUS:  He continues to have chronic peripheral neuropathy and imbalance issues.   He reports some ongoing joint pain, particularly in his bilateral shoulders and knees, getting steroid injections. He denies any recent fractures. He denies any B symptoms such as fever, chills, night sweats, unintentional weight loss. He has some chronic tinnitus, hearing loss, and progressive blurry vision; denies any acute neurologic changes. He denies any new masses or lymphadenopathy.  THROMBOCYTOPENIA:  He reports that he bruises and bleeds easily.   He reports that his gums will bleed when he brushes hard. He denies any major bleeding episodes - no epistaxis, no hemoptysis, hematuria, no rectal bleeding or melena.  He has 40% energy and 50% appetite.   His weight has been stable since his last visit.  ASSESSMENT & PLAN:  1.  IgG kappa MGUS - Worked up by neurology (Dr. Tonita Tobie) for lower extremity neuropathy and worsening balance problems, which showed M spike positive - Hematology workup (09/03/2021): Immunofixation with IgG kappa monoclonal protein and SPEP 0.4. - Skeletal survey (07/28/2022): 7 mm sclerotic lesion in mid left humerus appears new from prior.  No other sclerotic or lytic osseous lesions are identified - sclerotic lesion addressed below - Most recent skeletal survey (07/27/2023): Stable  sclerotic density in mid left humerus, likely benign.  No new suspicious osseous lesions.  - Most recent MGUS/myeloma panel (01/26/2024) Relatively stable M-spike 0.7 Stable elevation in FLC ratio 1.83 (kappa 30.5, lambda 16.7) No CRAB features: Hemoglobin 15.1, creatinine 0.89, calcium  9.4.  LDH normal. - Continues to report chronic neuropathy, balance issues, tinnitus, hearing loss, and blurry vision. - He reports pain around his left hip, left shoulder, and right wrist.  (Skeletal survey negative for any lytic lesions at these locations.  Left humerus sclerotic lesion addressed below.) - Reports 30 pound weight loss over the past 2-3 years due to decreased appetite.  His weight has been stable in the past 6 months. - PLAN: Check 24-hour urine/UPEP. - Next MGUS/myeloma panel due in 6 months (February 2026)  - Annual skeletal survey next due February 2026.    2.  Thrombocytopenia - Patient reports that he has had low platelets since around 2009, when he was hospitalized for bleeding ulcer and H. pylori. - Hematology workup: Normal LDH.  Negative ANA and rheumatoid factor. Normal B12, MMA, folate, copper . Hepatitis B and hepatitis C negative. Immature platelet fraction normal at 6.2% - Reports easy bruising and bleeding.  He reports that his gums will bleed when he brushes hard.  He has some pinkish discoloration to his sputum in the morning; no frank epistaxis or hemoptysis. - CT abdomen/pelvis (09/04/2022) showed normal spleen and liver.  No lymphadenopathy. - Most recent CBC/D (01/26/2024): Platelets 126, stable. - No masses or lymphadenopathy on exam - Differential diagnosis includes mild chronic ITP versus early MDS  - PLAN: Continue monitoring of platelets. - Extensive discussion with patient  regarding differential diagnosis (chronic ITP versus early MDS), and that no treatment is indicated at present.  We would consider bone marrow biopsy if development of any B symptoms or major  deviation from baseline.    3.  Sclerotic lesion of left humerus - Skeletal survey (07/28/2022): 7 mm sclerotic lesion in mid left humerus appears new from prior.  No other sclerotic or lytic osseous lesions are identified. - Follow-up imaging (10/26/2022) with x-ray of left humerus: Somewhat faint sclerotic lesion in left humeral shaft unchanged from 07/27/2022 - Most recent humerus x-ray (01/20/2023): Stable small sclerotic lesion in the left mid humeral shaft which has nonaggressive features and does not have the typical appearance of a sclerotic bone metastasis or concerning primary bone lesion. - PSA was normal at 1.20 on 10/26/2022.  (PSA 2.56 in 2015) -- CT A/P (09/04/2022): No abnormal lymphadenopathy or evidence of malignancy or metastatic disease - Most recent skeletal survey (07/27/2023): Stable sclerotic density in mid left humerus, likely benign.  No new suspicious osseous lesions.  - PLAN: No further oncology workup at this time.  Attention to this region on skeletal survey in 6 months.  4.  Peripheral neuropathy - Lower extremity neuropathy for the past several years - Previously took gabapentin , but self-discontinued this due to gait instability  5.  Other history - Lives at home with his wife.  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  He is retired from [employer].  He was exposed to agent orange in [place].  He is a non-smoker.   PLAN SUMMARY: >> 24-hour urine/UPEP >> Skeletal survey in 6 months >> Labs in 6 months = CBC/D, CMP, LDH, SPEP, light chains >> OFFICE visit in 6 months (1 week after labs)     REVIEW OF SYSTEMS:  Review of Systems  Constitutional:  Negative for appetite change, chills, diaphoresis, fatigue, fever and unexpected weight change.  HENT:   Positive for trouble swallowing (at times). Negative for lump/mass and nosebleeds.   Eyes:  Negative for eye problems.  Respiratory:  Positive for cough. Negative for hemoptysis and shortness of  breath.   Cardiovascular:  Negative for chest pain, leg swelling and palpitations.  Gastrointestinal:  Positive for constipation and nausea. Negative for abdominal pain, blood in stool, diarrhea and vomiting.  Genitourinary:  Negative for hematuria.   Musculoskeletal:  Positive for arthralgias, back pain and gait problem (poor balance).  Skin: Negative.   Neurological:  Positive for gait problem (poor balance), headaches and numbness. Negative for dizziness and light-headedness.  Hematological:  Does not bruise/bleed easily.     PHYSICAL EXAM:  ECOG PERFORMANCE STATUS: 1 - Symptomatic but completely ambulatory  Vitals:   02/02/24 1300  BP: 123/74  Pulse: 68  Resp: 18  Temp: 97.8 F (36.6 C)  SpO2: 98%    Filed Weights   02/02/24 1300  Weight: 173 lb 1.6 oz (78.5 kg)    Physical Exam Vitals reviewed.  Constitutional:      Appearance: Normal appearance.  Cardiovascular:     Rate and Rhythm: Regular rhythm.     Heart sounds: Normal heart sounds.  Pulmonary:     Breath sounds: Normal breath sounds.  Skin:    Findings: Bruising present.  Neurological:     Mental Status: He is alert.  Psychiatric:        Mood and Affect: Mood normal.        Behavior: Behavior normal.    PAST MEDICAL/SURGICAL HISTORY:  Past Medical  History:  Diagnosis Date   Arteriosclerotic cardiovascular disease (ASCVD)    Coronary artery bypass graft surgery in 12/98; DES to the RCA SVG in 07/2002; restenosis of the saphenous vein graft stent and renal intervention in 2006; 70% first diagonal present; calf in 11/2007-total obstruction of the saphenous vein graft to the first marginal   Benign prostatic hypertrophy    Cerebrovascular disease    60% bilateral internal carotid artery stenosis in 2012   Coronary artery disease    4 stents   Diaphragmatic hernia 01/15/2021   Diverticulosis    DJD (degenerative joint disease)    Erectile dysfunction    GERD (gastroesophageal reflux disease)    History  of blood transfusion    History of kidney stones    Hyperlipidemia    Lipid profile in 12/2009:104, 91, 36, 50   Hypertension    Kidney stone 09/08/2017   Migraine headache    Right bundle branch block    Right ureteral stone 03/08/2019   Shortness of breath    Thrombocytopenia (HCC)    platelets of 104 in 03/2009   Upper gastrointestinal hemorrhage 10/09    Helicobacter pylori positive; subsequent laparoscopic fundoplication-2009   Past Surgical History:  Procedure Laterality Date   CARDIAC CATHETERIZATION     4 cardiac stents   CATARACT EXTRACTION W/PHACO Right 07/25/2012   Procedure: CATARACT EXTRACTION PHACO AND INTRAOCULAR LENS PLACEMENT (IOC);  Surgeon: Cherene Mania, MD;  Location: [facility];  Service: Ophthalmology;  Laterality: Right;  CDE:23.31   CATARACT EXTRACTION W/PHACO Left 08/18/2012   Procedure: CATARACT EXTRACTION PHACO AND INTRAOCULAR LENS PLACEMENT (IOC);  Surgeon: Cherene Mania, MD;  Location: [facility];  Service: Ophthalmology;  Laterality: Left;  CDE: 16.07   COLONOSCOPY N/A 08/01/2014   Procedure: COLONOSCOPY;  Surgeon: Claudis RAYMOND Rivet, MD;  Location: [facility];  Service: Endoscopy;  Laterality: N/A;  125 - moved to 10:30 - Ann to notify pt   CORONARY ARTERY BYPASS GRAFT  1998    CYSTOSCOPY/URETEROSCOPY/HOLMIUM LASER/STENT PLACEMENT Right 11/12/2017   Procedure: CYSTOSCOPY/RIGHT RETROGRADE PYELOGRAM/RIGHT URETEROSCOPY/HOLMIUM LASER LITHOTRIPSY RIGHT URETERAL CALCULUS/RIGHT URETERAL STENT PLACEMENT;  Surgeon: Watt Rush, MD;  Location: [facility];  Service: Urology;  Laterality: Right;  right ureteral calculus given to patient's wife per Dr. Watt   CYSTOSCOPY/URETEROSCOPY/HOLMIUM LASER/STENT PLACEMENT Right 03/07/2019   Procedure: CYSTOSCOPY RIGHT RETROGRADE RIGHT URETEROSCOPY;  Surgeon: Watt Rush, MD;  Location: [facility];  Service: Urology;  Laterality: Right;   EXTRACORPOREAL SHOCK WAVE LITHOTRIPSY Right 09/13/2017   Procedure: RIGHT EXTRACORPOREAL SHOCK WAVE LITHOTRIPSY (ESWL);   Surgeon: Carolee Sherwood JONETTA DOUGLAS, MD;  Location: [facility];  Service: Urology;  Laterality: Right;   HERNIA REPAIR     right inguinal   LAPAROSCOPIC NISSEN FUNDOPLICATION  1997, 2009    revision in 2009 along with repair of paraesophagel hernia    PARAESOPHAGEAL HERNIA REPAIR     TRANSURETHRAL RESECTION OF PROSTATE N/A 03/07/2019   Procedure: TRANSURETHRAL RESECTION OF THE PROSTATE (TURP);  Surgeon: Watt Rush, MD;  Location: [facility];  Service: Urology;  Laterality: N/A;    SOCIAL HISTORY:  Social History   Socioeconomic History   Marital status: Married    Spouse name: Not on file   Number of children: Not on file   Years of education: Not on file   Highest education level: Not on file  Occupational History   Occupation: Full time    Employer: RETIRED  Tobacco Use   Smoking status: Never   Smokeless tobacco: Never  Vaping  Use   Vaping status: Never Used  Substance and Sexual Activity   Alcohol use: No    Alcohol/week: 0.0 standard drinks of alcohol   Drug use: No   Sexual activity: Yes    Birth control/protection: None  Other Topics Concern   Not on file  Social History Narrative   Right handed   Drinks caffeine   Two story home   Social Drivers of Health   Financial Resource Strain: Low Risk  (02/17/2023)   Overall Financial Resource Strain (CARDIA)    Difficulty of Paying Living Expenses: Not hard at all  Food Insecurity: No Food Insecurity (02/17/2023)   Hunger Vital Sign    Worried About Running Out of Food in the Last Year: Never true    Ran Out of Food in the Last Year: Never true  Transportation Needs: No Transportation Needs (02/17/2023)   PRAPARE - Administrator, Civil Service (Medical): No    Lack of Transportation (Non-Medical): No  Physical Activity: Sufficiently Active (02/17/2023)   Exercise Vital Sign    Days of Exercise per Week: 3 days    Minutes of Exercise per Session: 50 min  Stress: No Stress Concern Present (02/17/2023)   [employer] of  Occupational Health - Occupational Stress Questionnaire    Feeling of Stress : Not at all  Social Connections: Unknown (02/17/2023)   Social Connection and Isolation Panel    Frequency of Communication with Friends and Family: Three times a week    Frequency of Social Gatherings with Friends and Family: Once a week    Attends Religious Services: Never    Database administrator or Organizations: Patient declined    Attends Banker Meetings: Patient declined    Marital Status: Married  Catering manager Violence: Not At Risk (02/18/2023)   Humiliation, Afraid, Rape, and Kick questionnaire    Fear of Current or Ex-Partner: No    Emotionally Abused: No    Physically Abused: No    Sexually Abused: No    FAMILY HISTORY:  Family History  Problem Relation Age of Onset   CAD Father     CURRENT MEDICATIONS:  Outpatient Encounter Medications as of 02/02/2024  Medication Sig   aspirin  81 MG tablet Take 81 mg by mouth daily.     atorvastatin  (LIPITOR) 20 MG tablet Take 1 tablet (20 mg total) by mouth at bedtime.   carvedilol  (COREG ) 6.25 MG tablet Take 1 tablet (6.25 mg total) by mouth daily.   Lactulose  20 GM/30ML SOLN Take 30 mLs (20 g total) by mouth daily as needed (Constipation).   levothyroxine  (SYNTHROID ) 50 MCG tablet TAKE 1 TABLET DAILY BEFORE BREAKFAST   Multiple Vitamin (MULTIVITAMIN ADULT PO) Take by mouth.   nitroGLYCERIN  (NITROSTAT ) 0.4 MG SL tablet Place 1 tablet (0.4 mg total) under the tongue every 5 (five) minutes as needed for chest pain.   ondansetron  (ZOFRAN ) 4 MG tablet Take 1 tablet (4 mg total) by mouth every 8 (eight) hours as needed for nausea or vomiting.   promethazine  (PHENERGAN ) 25 MG tablet Take 1 tablet (25 mg total) by mouth every 6 (six) hours as needed for nausea or vomiting.   traMADol  (ULTRAM ) 50 MG tablet Take 1 tablet (50 mg total) by mouth every 12 (twelve) hours as needed for moderate pain or severe pain (pain).   [DISCONTINUED] fluorometholone  (FML) 0.1 % ophthalmic suspension SMARTSIG:1 Drop(s) In Eye(s) 2-4 Times Daily PRN   No facility-administered encounter medications on file as  of 02/02/2024.    ALLERGIES:  Allergies  Allergen Reactions   Bactrim [Sulfamethoxazole-Trimethoprim] Anaphylaxis   Clarithromycin Anaphylaxis    REACTION: UNKNOWN REACTION   Codeine Nausea And Vomiting   Erythromycin Anaphylaxis   Sulfa Antibiotics Anaphylaxis   Duloxetine      Lip swelling   Trimethoprim     LABORATORY DATA:  I have reviewed the labs as listed.  CBC    Component Value Date/Time   WBC 7.0 01/26/2024 1313   RBC 5.15 01/26/2024 1313   HGB 16.0 01/26/2024 1313   HGB 15.2 09/15/2021 1135   HCT 49.0 01/26/2024 1313   HCT 46.4 09/15/2021 1135   PLT 126 (L) 01/26/2024 1313   PLT 139 (L) 09/15/2021 1135   MCV 95.1 01/26/2024 1313   MCV 89 09/15/2021 1135   MCH 31.1 01/26/2024 1313   MCHC 32.7 01/26/2024 1313   RDW 13.0 01/26/2024 1313   RDW 12.4 09/15/2021 1135   LYMPHSABS 1.9 01/26/2024 1313   LYMPHSABS 1.5 09/15/2021 1135   MONOABS 0.6 01/26/2024 1313   EOSABS 0.1 01/26/2024 1313   EOSABS 0.2 09/15/2021 1135   BASOSABS 0.0 01/26/2024 1313   BASOSABS 0.0 09/15/2021 1135      Latest Ref Rng & Units 01/26/2024    1:13 PM 07/27/2023    1:01 PM 01/26/2023    2:31 PM  CMP  Glucose 70 - 99 mg/dL 864  83  893   BUN 8 - 23 mg/dL 20  25  20    Creatinine 0.61 - 1.24 mg/dL 9.14  9.10  9.05   Sodium 135 - 145 mmol/L 140  143  134   Potassium 3.5 - 5.1 mmol/L 4.8  4.5  4.7   Chloride 98 - 111 mmol/L 105  107  100   CO2 22 - 32 mmol/L 25  29  25    Calcium  8.9 - 10.3 mg/dL 9.1  9.4  9.4   Total Protein 6.5 - 8.1 g/dL 7.4  7.3  7.4   Total Bilirubin 0.0 - 1.2 mg/dL 1.2  1.2  1.3   Alkaline Phos 38 - 126 U/L 62  62  67   AST 15 - 41 U/L 32  29  27   ALT 0 - 44 U/L 36  30  28     DIAGNOSTIC IMAGING:  I have independently reviewed the relevant imaging and discussed with the patient.   WRAP UP:  All questions were  answered. The patient knows to call the clinic with any problems, questions or concerns.  Medical decision making: Moderate  Time spent on visit: I spent 25 minutes counseling the patient face to face. The total time spent in the appointment was 40 minutes and more than 50% was on counseling.  Pleasant CHRISTELLA Barefoot, PA-C  02/02/24 1:47 PM

## 2024-02-02 ENCOUNTER — Inpatient Hospital Stay (HOSPITAL_BASED_OUTPATIENT_CLINIC_OR_DEPARTMENT_OTHER): Payer: Medicare Other | Admitting: Physician Assistant

## 2024-02-02 VITALS — BP 123/74 | HR 68 | Temp 97.8°F | Resp 18 | Wt 173.1 lb

## 2024-02-02 DIAGNOSIS — D472 Monoclonal gammopathy: Secondary | ICD-10-CM

## 2024-02-02 DIAGNOSIS — D696 Thrombocytopenia, unspecified: Secondary | ICD-10-CM | POA: Diagnosis not present

## 2024-02-02 DIAGNOSIS — R936 Abnormal findings on diagnostic imaging of limbs: Secondary | ICD-10-CM

## 2024-02-02 NOTE — Patient Instructions (Signed)
 [facility] at [facility] **VISIT SUMMARY & IMPORTANT INSTRUCTIONS **   You were seen today by Pleasant Barefoot PA-C for your follow-up visit.    MGUS As we discussed, MGUS is a precancerous condition where your body has abnormal plasma cells that make too many of a certain type of immunoglobulin. At this time, the elevation in your immunoglobulin is very mild and is not causing any issues. Will continue to keep watch on these labs every 6 months (next due in February 2026). You will be due for whole-body x-rays once a year (February 2026). The previously noted spot in your left arm bone (humerus) is stable and does not show any changes or features concerning for cancer. We will also check a 24-hour urine study.  Kit has been provided for you. FIRST MORNING: Discard first urine of the morning into the toilet. Collect the rest of your urine in the orange jug for the next 24 hours. SECOND MORNING: Collect your first urine of the morning in the jug.  This ends your 24-hour urine collection. **Store the urine jug in the refrigerator but is not being used. Return urine jug to fourth floor front desk as soon as it is completed.   LOW PLATELETS Your labs did not show any obvious cause of low platelets.  Your low platelets may be a sign of underlying bone marrow disorder or immune system dysfunction causing your body to attack its own platelets. Since your platelet levels are only mildly low, you do not need any invasive testing (i.e. bone marrow biopsy) or treatment at this time. We will continue to monitor closely.  FOLLOW-UP APPOINTMENT: Office visit in 6 months (1 week after labs)   ** Thank you for trusting me with your healthcare!  I strive to provide all of my patients with quality care at each visit.  If you receive a survey for this visit, I would be so grateful to you for taking the time to provide feedback.  Thank you in advance!  ~ Murray Durrell                                         Dr. Mickiel Davonna Pleasant Barefoot, PA-C          Delon Hope, NP    - - - - - - - - - - - - - - - - - -    Thank you for choosing [facility] at [facility] to provide your oncology and hematology care.  To afford each patient quality time with our provider, please arrive at least 15 minutes before your scheduled appointment time.   If you have a lab appointment with the Cancer Center please come in thru the Main Entrance and check in at the main information desk.  You need to re-schedule your appointment should you arrive 10 or more minutes late.  We strive to give you quality time with our providers, and arriving late affects you and other patients whose appointments are after yours.  Also, if you no show three or more times for appointments you may be dismissed from the clinic at the providers discretion.     Again, thank you for choosing [facility].  Our hope is that these requests will decrease the amount of time that you wait before being seen  by our physicians.       _____________________________________________________________  Should you have questions after your visit to [facility], please contact our office at [phone] and follow the prompts.  Our office hours are 8:00 a.m. and 4:30 p.m. Monday - Friday.  Please note that voicemails left after 4:00 p.m. may not be returned until the following business day.  We are closed weekends and major holidays.  You do have access to a nurse 24-7, just call the main number to the clinic [phone] and do not press any options, hold on the line and a nurse will answer the phone.    For prescription refill requests, have your pharmacy contact our office and allow 72 hours.

## 2024-02-04 ENCOUNTER — Other Ambulatory Visit (HOSPITAL_COMMUNITY): Payer: Self-pay

## 2024-02-04 DIAGNOSIS — D472 Monoclonal gammopathy: Secondary | ICD-10-CM | POA: Diagnosis not present

## 2024-02-08 LAB — UPEP/UIFE/LIGHT CHAINS/TP, 24-HR UR
% BETA, Urine: 24.5 %
ALPHA 1 URINE: 6.2 %
Albumin, U: 28.1 %
Alpha 2, Urine: 16.9 %
Free Kappa Lt Chains,Ur: 75.73 mg/L (ref 1.17–86.46)
Free Kappa/Lambda Ratio: 13.43 (ref 1.83–14.26)
Free Lambda Lt Chains,Ur: 5.64 mg/L (ref 0.27–15.21)
GAMMA GLOBULIN URINE: 24.3 %
Total Protein, Urine-Ur/day: 65 mg/(24.h) (ref 30–150)
Total Protein, Urine: 9.3 mg/dL
Total Volume: 700

## 2024-02-13 ENCOUNTER — Other Ambulatory Visit: Payer: Self-pay | Admitting: Internal Medicine

## 2024-02-13 DIAGNOSIS — E782 Mixed hyperlipidemia: Secondary | ICD-10-CM

## 2024-02-13 DIAGNOSIS — I25118 Atherosclerotic heart disease of native coronary artery with other forms of angina pectoris: Secondary | ICD-10-CM

## 2024-03-22 ENCOUNTER — Encounter: Payer: Self-pay | Admitting: Internal Medicine

## 2024-03-22 ENCOUNTER — Ambulatory Visit (INDEPENDENT_AMBULATORY_CARE_PROVIDER_SITE_OTHER): Admitting: Internal Medicine

## 2024-03-22 VITALS — BP 147/72 | HR 80 | Ht 72.0 in | Wt 172.8 lb

## 2024-03-22 DIAGNOSIS — E039 Hypothyroidism, unspecified: Secondary | ICD-10-CM

## 2024-03-22 DIAGNOSIS — I2510 Atherosclerotic heart disease of native coronary artery without angina pectoris: Secondary | ICD-10-CM

## 2024-03-22 DIAGNOSIS — K5909 Other constipation: Secondary | ICD-10-CM

## 2024-03-22 DIAGNOSIS — E782 Mixed hyperlipidemia: Secondary | ICD-10-CM | POA: Diagnosis not present

## 2024-03-22 DIAGNOSIS — G609 Hereditary and idiopathic neuropathy, unspecified: Secondary | ICD-10-CM

## 2024-03-22 DIAGNOSIS — I10 Essential (primary) hypertension: Secondary | ICD-10-CM | POA: Diagnosis not present

## 2024-03-22 DIAGNOSIS — N401 Enlarged prostate with lower urinary tract symptoms: Secondary | ICD-10-CM

## 2024-03-22 DIAGNOSIS — M170 Bilateral primary osteoarthritis of knee: Secondary | ICD-10-CM

## 2024-03-22 DIAGNOSIS — R7303 Prediabetes: Secondary | ICD-10-CM

## 2024-03-22 DIAGNOSIS — R110 Nausea: Secondary | ICD-10-CM | POA: Insufficient documentation

## 2024-03-22 MED ORDER — PROMETHAZINE HCL 25 MG PO TABS: 25.0000 mg | ORAL_TABLET | Freq: Four times a day (QID) | ORAL | 0 refills | Status: AC | PRN

## 2024-03-22 MED ORDER — NITROGLYCERIN 0.4 MG SL SUBL: 0.4000 mg | SUBLINGUAL_TABLET | SUBLINGUAL | 1 refills | Status: AC | PRN

## 2024-03-22 MED ORDER — ONDANSETRON HCL 4 MG PO TABS: 4.0000 mg | ORAL_TABLET | Freq: Three times a day (TID) | ORAL | 0 refills | Status: AC | PRN

## 2024-03-22 MED ORDER — TRAMADOL HCL 50 MG PO TABS: 50.0000 mg | ORAL_TABLET | Freq: Two times a day (BID) | ORAL | 0 refills | Status: AC | PRN

## 2024-03-22 NOTE — Assessment & Plan Note (Signed)
 On statin Check lipid profile

## 2024-03-22 NOTE — Assessment & Plan Note (Signed)
 Lab Results  Component Value Date   HGBA1C 6.0 (H) 09/20/2023   Advised to follow low carb diet

## 2024-03-22 NOTE — Assessment & Plan Note (Signed)
Has had prostate surgery, followed by Urology Was on Tamsulosin in the past

## 2024-03-22 NOTE — Patient Instructions (Addendum)
Please continue to take medications as prescribed.  Please continue to follow low salt diet and perform moderate exercise/walking as tolerated.  Please get fasting blood tests done before the next visit.

## 2024-03-22 NOTE — Assessment & Plan Note (Signed)
Chronic idiopathic constipation Has tried multiple OTC laxatives and stool softeners without much relief Also takes prunes Linzess was not covered, now better with Lactulose Advised to maintain adequate hydration

## 2024-03-22 NOTE — Assessment & Plan Note (Signed)
 BP Readings from Last 1 Encounters:  02/02/24 123/74   Overall well-controlled with Coreg  6.25 mg once daily (recommended by Cardiology) Counseled for compliance with the medications Advised DASH diet and moderate exercise/walking as tolerated

## 2024-03-22 NOTE — Progress Notes (Signed)
 Established Patient Office Visit  Subjective:  Patient ID: Jorge Bishop, male    DOB: 05-06-1936  Age: 88 y.o. MRN: [id number]  CC:  Chief Complaint  Patient presents with   Coronary Artery Disease    6 month f/u    Hyperlipidemia    6 month f/u     HPI Jorge Bishop is a 88 y.o. male with past medical history of CAD s/p CABG, hypertension, HLD, prediabetes, hypothyroidism, MGUS, GERD, lumbar spinal stenosis s/p lumbar laminectomy and BPH who presents for f/u of his chronic medical conditions.  CAD s/p CABG and hypertension: BP is well-controlled. Takes Coreg  6.25 mg QD regularly. Patient denies headache, dizziness, chest pain, dyspnea or palpitations.  He follows up with cardiology.  He is on Coreg , aspirin  and atorvastatin .   MGUS: He was diagnosed with MGUS, and had a skeletal survey done, which showed 7 mm sclerotic lesion over left humerus.  Followed by oncology currently.  Neuropathy: He still complains of burning pain in his legs and feet. He has seen Neurology for it now. He felt drowsiness with gabapentin . He had stomatitis and lip swelling with duloxetine , and has stopped taking it.   Hypothyroidism: Takes levothyroxine  50 mcg QD.  Denies any recent change in weight or appetite.  Chronic constipation: He reports chronic constipation, but has improved with lactulose .  He denies any melena or hematochezia.  He used to strain hard while passing stool.  He has tried OTC stool softener-Dulcolax, MiraLAX, fiber supplement/Metamucil and probiotics without relief.  Bilateral knee pain: He complains of bilateral knee pain with swelling.  His pain is worse upon standing and walking.  He has remote history of meniscal tear. He takes tramadol  as needed for pain.   Past Medical History:  Diagnosis Date   Arteriosclerotic cardiovascular disease (ASCVD)    Coronary artery bypass graft surgery in 12/98; DES to the RCA SVG in 07/2002; restenosis of the saphenous vein graft stent and renal  intervention in 2006; 70% first diagonal present; calf in 11/2007-total obstruction of the saphenous vein graft to the first marginal   Benign prostatic hypertrophy    Cerebrovascular disease    60% bilateral internal carotid artery stenosis in 2012   Coronary artery disease    4 stents   Diaphragmatic hernia 01/15/2021   Diverticulosis    DJD (degenerative joint disease)    Erectile dysfunction    GERD (gastroesophageal reflux disease)    History of blood transfusion    History of kidney stones    Hyperlipidemia    Lipid profile in 12/2009:104, 91, 36, 50   Hypertension    Kidney stone 09/08/2017   Migraine headache    Right bundle branch block    Right ureteral stone 03/08/2019   Shortness of breath    Thrombocytopenia    platelets of 104 in 03/2009   Upper gastrointestinal hemorrhage 10/09    Helicobacter pylori positive; subsequent laparoscopic fundoplication-2009    Past Surgical History:  Procedure Laterality Date   CARDIAC CATHETERIZATION     4 cardiac stents   CATARACT EXTRACTION W/PHACO Right 07/25/2012   Procedure: CATARACT EXTRACTION PHACO AND INTRAOCULAR LENS PLACEMENT (IOC);  Surgeon: Cherene Mania, MD;  Location: [facility];  Service: Ophthalmology;  Laterality: Right;  CDE:23.31   CATARACT EXTRACTION W/PHACO Left 08/18/2012   Procedure: CATARACT EXTRACTION PHACO AND INTRAOCULAR LENS PLACEMENT (IOC);  Surgeon: Cherene Mania, MD;  Location: [facility];  Service: Ophthalmology;  Laterality: Left;  CDE: 16.07   COLONOSCOPY N/A  08/01/2014   Procedure: COLONOSCOPY;  Surgeon: Claudis RAYMOND Rivet, MD;  Location: [facility];  Service: Endoscopy;  Laterality: N/A;  125 - moved to 10:30 - Ann to notify pt   CORONARY ARTERY BYPASS GRAFT  1998    CYSTOSCOPY/URETEROSCOPY/HOLMIUM LASER/STENT PLACEMENT Right 11/12/2017   Procedure: CYSTOSCOPY/RIGHT RETROGRADE PYELOGRAM/RIGHT URETEROSCOPY/HOLMIUM LASER LITHOTRIPSY RIGHT URETERAL CALCULUS/RIGHT URETERAL STENT PLACEMENT;  Surgeon: Watt Rush, MD;   Location: [facility];  Service: Urology;  Laterality: Right;  right ureteral calculus given to patient's wife per Dr. Watt   CYSTOSCOPY/URETEROSCOPY/HOLMIUM LASER/STENT PLACEMENT Right 03/07/2019   Procedure: CYSTOSCOPY RIGHT RETROGRADE RIGHT URETEROSCOPY;  Surgeon: Watt Rush, MD;  Location: [facility];  Service: Urology;  Laterality: Right;   EXTRACORPOREAL SHOCK WAVE LITHOTRIPSY Right 09/13/2017   Procedure: RIGHT EXTRACORPOREAL SHOCK WAVE LITHOTRIPSY (ESWL);  Surgeon: Carolee Sherwood JONETTA DOUGLAS, MD;  Location: [facility];  Service: Urology;  Laterality: Right;   HERNIA REPAIR     right inguinal   LAPAROSCOPIC NISSEN FUNDOPLICATION  1997, 2009    revision in 2009 along with repair of paraesophagel hernia    PARAESOPHAGEAL HERNIA REPAIR     TRANSURETHRAL RESECTION OF PROSTATE N/A 03/07/2019   Procedure: TRANSURETHRAL RESECTION OF THE PROSTATE (TURP);  Surgeon: Watt Rush, MD;  Location: [facility];  Service: Urology;  Laterality: N/A;    Family History  Problem Relation Age of Onset   CAD Father     Social History   Socioeconomic History   Marital status: Married    Spouse name: Not on file   Number of children: Not on file   Years of education: Not on file   Highest education level: Associate degree: academic program  Occupational History   Occupation: Full time    Employer: RETIRED  Tobacco Use   Smoking status: Never   Smokeless tobacco: Never  Vaping Use   Vaping status: Never Used  Substance and Sexual Activity   Alcohol use: No    Alcohol/week: 0.0 standard drinks of alcohol   Drug use: No   Sexual activity: Yes    Birth control/protection: None  Other Topics Concern   Not on file  Social History Narrative   Right handed   Drinks caffeine   Two story home   Social Drivers of Health   Financial Resource Strain: Low Risk  (03/15/2024)   Overall Financial Resource Strain (CARDIA)    Difficulty of Paying Living Expenses: Not hard at all  Food Insecurity: No Food Insecurity (03/15/2024)    Hunger Vital Sign    Worried About Running Out of Food in the Last Year: Never true    Ran Out of Food in the Last Year: Never true  Transportation Needs: No Transportation Needs (03/15/2024)   PRAPARE - Administrator, Civil Service (Medical): No    Lack of Transportation (Non-Medical): No  Physical Activity: Insufficiently Active (03/15/2024)   Exercise Vital Sign    Days of Exercise per Week: 2 days    Minutes of Exercise per Session: 40 min  Stress: No Stress Concern Present (03/15/2024)   [employer] of Occupational Health - Occupational Stress Questionnaire    Feeling of Stress: Only a little  Social Connections: Socially Isolated (03/15/2024)   Social Connection and Isolation Panel    Frequency of Communication with Friends and Family: Once a week    Frequency of Social Gatherings with Friends and Family: Once a week    Attends Religious Services: Never    Database administrator or  Organizations: No    Attends Banker Meetings: Not on file    Marital Status: Married  Intimate Partner Violence: Not At Risk (02/18/2023)   Humiliation, Afraid, Rape, and Kick questionnaire    Fear of Current or Ex-Partner: No    Emotionally Abused: No    Physically Abused: No    Sexually Abused: No    Outpatient Medications Prior to Visit  Medication Sig Dispense Refill   aspirin  81 MG tablet Take 81 mg by mouth daily.       atorvastatin  (LIPITOR) 20 MG tablet TAKE 1 TABLET AT BEDTIME 90 tablet 3   carvedilol  (COREG ) 6.25 MG tablet Take 1 tablet (6.25 mg total) by mouth daily. 90 tablet 3   Lactulose  20 GM/30ML SOLN Take 30 mLs (20 g total) by mouth daily as needed (Constipation). 450 mL 3   levothyroxine  (SYNTHROID ) 50 MCG tablet TAKE 1 TABLET DAILY BEFORE BREAKFAST 90 tablet 3   Multiple Vitamin (MULTIVITAMIN ADULT PO) Take by mouth.     nitroGLYCERIN  (NITROSTAT ) 0.4 MG SL tablet Place 1 tablet (0.4 mg total) under the tongue every 5 (five) minutes as needed for  chest pain. 30 tablet 0   ondansetron  (ZOFRAN ) 4 MG tablet Take 1 tablet (4 mg total) by mouth every 8 (eight) hours as needed for nausea or vomiting. 30 tablet 0   promethazine  (PHENERGAN ) 25 MG tablet Take 1 tablet (25 mg total) by mouth every 6 (six) hours as needed for nausea or vomiting. 30 tablet 0   traMADol  (ULTRAM ) 50 MG tablet Take 1 tablet (50 mg total) by mouth every 12 (twelve) hours as needed for moderate pain or severe pain (pain). 30 tablet 0   No facility-administered medications prior to visit.    Allergies  Allergen Reactions   Bactrim [Sulfamethoxazole-Trimethoprim] Anaphylaxis   Clarithromycin Anaphylaxis    REACTION: UNKNOWN REACTION   Codeine Nausea And Vomiting   Erythromycin Anaphylaxis   Sulfa Antibiotics Anaphylaxis   Duloxetine      Lip swelling   Trimethoprim     ROS Review of Systems  Constitutional:  Negative for chills and fever.  HENT:  Negative for congestion and sore throat.   Eyes:  Negative for pain and discharge.  Respiratory:  Negative for cough and shortness of breath.   Cardiovascular:  Negative for chest pain and palpitations.  Gastrointestinal:  Positive for constipation. Negative for diarrhea, nausea and vomiting.  Endocrine: Negative for polydipsia and polyuria.  Genitourinary:  Negative for dysuria and hematuria.  Musculoskeletal:  Positive for arthralgias, back pain and gait problem. Negative for neck pain and neck stiffness.  Skin:  Negative for rash.  Neurological:  Positive for numbness. Negative for dizziness, weakness and headaches.  Psychiatric/Behavioral:  Negative for agitation and behavioral problems.       Objective:    Physical Exam Vitals reviewed.  Constitutional:      General: He is not in acute distress.    Appearance: He is not diaphoretic.  HENT:     Head: Normocephalic and atraumatic.     Nose: Nose normal.     Mouth/Throat:     Mouth: Mucous membranes are moist.  Eyes:     General: No scleral  icterus.    Extraocular Movements: Extraocular movements intact.  Cardiovascular:     Rate and Rhythm: Normal rate and regular rhythm.     Heart sounds: Normal heart sounds. No murmur heard. Pulmonary:     Breath sounds: Normal breath sounds. No wheezing or rales.  Musculoskeletal:     Cervical back: Neck supple. No tenderness.     Right knee: Swelling present. Decreased range of motion.     Left knee: Swelling present. Decreased range of motion.     Right lower leg: No edema.     Left lower leg: No edema.  Skin:    General: Skin is warm and dry.     Findings: No rash.  Neurological:     General: No focal deficit present.     Mental Status: He is alert and oriented to person, place, and time.     Sensory: Sensory deficit (B/l feet) present.     Motor: Weakness (B/l LE) present.     Gait: Gait abnormal.  Psychiatric:        Mood and Affect: Mood normal.        Behavior: Behavior normal.     BP (!) 147/72   Pulse 80   Ht 6' (1.829 m)   Wt 172 lb 12.8 oz (78.4 kg)   SpO2 99%   BMI 23.44 kg/m  Wt Readings from Last 3 Encounters:  03/22/24 172 lb 12.8 oz (78.4 kg)  02/02/24 173 lb 1.6 oz (78.5 kg)  10/13/23 172 lb (78 kg)    Lab Results  Component Value Date   TSH 2.070 09/20/2023   Lab Results  Component Value Date   WBC 7.0 01/26/2024   HGB 16.0 01/26/2024   HCT 49.0 01/26/2024   MCV 95.1 01/26/2024   PLT 126 (L) 01/26/2024   Lab Results  Component Value Date   NA 140 01/26/2024   K 4.8 01/26/2024   CO2 25 01/26/2024   GLUCOSE 135 (H) 01/26/2024   BUN 20 01/26/2024   CREATININE 0.85 01/26/2024   BILITOT 1.2 01/26/2024   ALKPHOS 62 01/26/2024   AST 32 01/26/2024   ALT 36 01/26/2024   PROT 7.4 01/26/2024   ALBUMIN 4.1 01/26/2024   CALCIUM  9.1 01/26/2024   ANIONGAP 10 01/26/2024   EGFR 80 04/07/2022   Lab Results  Component Value Date   CHOL 142 09/20/2023   Lab Results  Component Value Date   HDL 33 (L) 09/20/2023   Lab Results  Component  Value Date   LDLCALC 89 09/20/2023   Lab Results  Component Value Date   TRIG 105 09/20/2023   Lab Results  Component Value Date   CHOLHDL 4.3 09/20/2023   Lab Results  Component Value Date   HGBA1C 6.0 (H) 09/20/2023      Assessment & Plan:   Problem List Items Addressed This Visit       Cardiovascular and Mediastinum   Hypertension (Chronic)   BP Readings from Last 1 Encounters:  02/02/24 123/74   Overall well-controlled with Coreg  6.25 mg once daily (recommended by Cardiology) Counseled for compliance with the medications Advised DASH diet and moderate exercise/walking as tolerated      Relevant Medications   nitroGLYCERIN  (NITROSTAT ) 0.4 MG SL tablet   CAD (coronary artery disease) - Primary (Chronic)   CABG in 12/98; DES to the RCA SVG in 07/2002; restenosis SVG stent and re-intervention in 2006; 70% residual D1; cath in 11/2007: TO SVG M1  Followed by Cardiology On Aspirin , statin and Coreg  Has NTG PRN, does not have any anginal symptoms currently      Relevant Medications   nitroGLYCERIN  (NITROSTAT ) 0.4 MG SL tablet     Digestive   Chronic constipation   Chronic idiopathic constipation Has tried multiple OTC laxatives and stool softeners  without much relief Also takes prunes Linzess  was not covered, now better with Lactulose  Advised to maintain adequate hydration        Endocrine   Hypothyroidism   On Levothyroxine  50 mcg QD Last TSH - wnl      Relevant Orders   TSH + free T4     Nervous and Auditory   Idiopathic peripheral neuropathy   Had drowsiness with gabapentin  100 mg dose Had excellent response with Cymbalta , but had stomatitis and lip swelling, had to discontinue Cymbalta  Advised to take Vit B12 1000 mcg QD Has been evaluated by neurology        Musculoskeletal and Integument   Primary osteoarthritis of both knees   Tylenol  PRN Avoid oral NSAIDs due to CAD Tramadol  as needed for severe pain - refilled Referred to Orthopedic  surgery - had steroid injections      Relevant Medications   traMADol  (ULTRAM ) 50 MG tablet     Genitourinary   Benign localized prostatic hyperplasia with lower urinary tract symptoms (LUTS)   Has had prostate surgery, followed by Urology Was on Tamsulosin  in the past        Other   Hyperlipidemia (Chronic)   On statin Check lipid profile      Relevant Medications   nitroGLYCERIN  (NITROSTAT ) 0.4 MG SL tablet   Other Relevant Orders   Lipid Profile   Prediabetes   Lab Results  Component Value Date   HGBA1C 6.0 (H) 09/20/2023   Advised to follow low carb diet      Relevant Orders   Hemoglobin A1c   Nausea   Takes Phenergan  PRN for nausea, gets 30 count in a year, refilled Prefers to take Zofran  PRN for nausea when he is outdoors or about to be driving, refilled      Relevant Medications   ondansetron  (ZOFRAN ) 4 MG tablet   promethazine  (PHENERGAN ) 25 MG tablet     Meds ordered this encounter  Medications   nitroGLYCERIN  (NITROSTAT ) 0.4 MG SL tablet    Sig: Place 1 tablet (0.4 mg total) under the tongue every 5 (five) minutes as needed for chest pain.    Dispense:  10 tablet    Refill:  1   ondansetron  (ZOFRAN ) 4 MG tablet    Sig: Take 1 tablet (4 mg total) by mouth every 8 (eight) hours as needed for nausea or vomiting.    Dispense:  30 tablet    Refill:  0   traMADol  (ULTRAM ) 50 MG tablet    Sig: Take 1 tablet (50 mg total) by mouth every 12 (twelve) hours as needed for moderate pain (pain score 4-6) or severe pain (pain score 7-10) (pain).    Dispense:  30 tablet    Refill:  0   promethazine  (PHENERGAN ) 25 MG tablet    Sig: Take 1 tablet (25 mg total) by mouth every 6 (six) hours as needed for nausea or vomiting.    Dispense:  30 tablet    Refill:  0    Follow-up: Return in about 6 months (around 09/20/2024) for HLD and hypothyroidism.    Suzzane MARLA Blanch, MD

## 2024-03-22 NOTE — Assessment & Plan Note (Signed)
On Levothyroxine 50 mcg QD ?Last TSH - wnl ? ?

## 2024-03-22 NOTE — Assessment & Plan Note (Signed)
 Had drowsiness with gabapentin  100 mg dose Had excellent response with Cymbalta , but had stomatitis and lip swelling, had to discontinue Cymbalta  Advised to take Vit B12 1000 mcg QD Has been evaluated by neurology

## 2024-03-22 NOTE — Assessment & Plan Note (Addendum)
 Tylenol  PRN Avoid oral NSAIDs due to CAD Tramadol  as needed for severe pain - refilled Referred to Orthopedic surgery - had steroid injections

## 2024-03-22 NOTE — Assessment & Plan Note (Signed)
CABG in 12/98; DES to the RCA SVG in 07/2002; restenosis SVG stent and re-intervention in 2006; 70% residual D1; cath in 11/2007: TO SVG M1  Followed by Cardiology On Aspirin, statin and Coreg Has NTG PRN, does not have any anginal symptoms currently

## 2024-03-22 NOTE — Assessment & Plan Note (Addendum)
 Takes Phenergan  PRN for nausea, gets 30 count in a year, refilled Prefers to take Zofran  PRN for nausea when he is outdoors or about to be driving, refilled

## 2024-04-19 ENCOUNTER — Encounter: Payer: Self-pay | Admitting: Orthopedic Surgery

## 2024-04-19 ENCOUNTER — Ambulatory Visit: Admitting: Orthopedic Surgery

## 2024-04-19 DIAGNOSIS — M170 Bilateral primary osteoarthritis of knee: Secondary | ICD-10-CM | POA: Diagnosis not present

## 2024-04-19 NOTE — Patient Instructions (Signed)

## 2024-04-19 NOTE — Progress Notes (Signed)
 Return Patient Visit  Assessment: Jorge Bishop is a 88 y.o. male with the following: Bilateral knee arthritis  Plan: [name] has arthritis in both knees.  Injections have provided relief for 7-8 weeks.  He would like to proceed with repeat injections today.  These were completed without issues.  Procedure note injection Right knee joint   Verbal consent was obtained to inject the right knee joint  Timeout was completed to confirm the site of injection.  The skin was prepped with alcohol and ethyl chloride was sprayed at the injection site.  A 21-gauge needle was used to inject 40 mg of Depo-Medrol  and 1% lidocaine  (4 cc) into the right knee using an anterolateral approach.  There were no complications. A sterile bandage was applied.   Procedure note injection Left knee joint   Verbal consent was obtained to inject the left knee joint  Timeout was completed to confirm the site of injection.  The skin was prepped with alcohol and ethyl chloride was sprayed at the injection site.  A 21-gauge needle was used to inject 40 mg of Depo-Medrol  and 1% lidocaine  (4 cc) into the left knee using an anterolateral approach.  There were no complications. A sterile bandage was applied.      Follow-up: Return if symptoms worsen or fail to improve.  Subjective:  Chief Complaint  Patient presents with   Injections    Bilat knees NDC [address]-8 Lot #[id number] exp 01/13/2026    History of Present Illness: Jorge Bishop is a 88 y.o. male who returns to clinic for evaluation of bilateral knee pain.  He has had multiple injections in the past.  He states the most recent injections are lasting about 7-8 weeks.  He is not interested in surgery.  Review of Systems: No fevers or chills No numbness or tingling No chest pain No shortness of breath No bowel or bladder dysfunction No GI distress No headaches   Objective: There were no vitals taken for this visit.  Physical  Exam:  General: Elderly male., Alert and oriented., and No acute distress. Gait: Slow, steady gait.  Ambulating with the assistance of a cane.  Bilateral knees without swelling.  He has good range of motion.  No increased laxity to varus or valgus stress.  Tenderness palpation along the medial joint line.  Negative Lachman bilaterally.  IMAGING: No new imaging obtained today     New Medications:  No orders of the defined types were placed in this encounter.     [name], MD  04/19/2024 2:34 PM

## 2024-04-20 ENCOUNTER — Other Ambulatory Visit: Payer: Self-pay | Admitting: Internal Medicine

## 2024-04-20 DIAGNOSIS — K5909 Other constipation: Secondary | ICD-10-CM

## 2024-04-27 ENCOUNTER — Ambulatory Visit (INDEPENDENT_AMBULATORY_CARE_PROVIDER_SITE_OTHER)

## 2024-04-27 VITALS — BP 134/74 | HR 73 | Resp 14 | Ht 72.0 in | Wt 175.1 lb

## 2024-04-27 DIAGNOSIS — Z0000 Encounter for general adult medical examination without abnormal findings: Secondary | ICD-10-CM | POA: Diagnosis not present

## 2024-04-27 NOTE — Patient Instructions (Signed)
 [name],  Thank you for taking the time for your Medicare Wellness Visit. I appreciate your continued commitment to your health goals. Please review the care plan we discussed, and feel free to reach out if I can assist you further.  Please note that Annual Wellness Visits do not include a physical exam. Some assessments may be limited, especially if the visit was conducted virtually. If needed, we may recommend an in-person follow-up with your provider.  Ongoing Care Seeing your primary care provider every 3 to 6 months helps us  monitor your health and provide consistent, personalized care.   Referrals If a referral was made during today's visit and you haven't received any updates within two weeks, please contact the referred provider directly to check on the status.  NEXT MEDICARE WELLNESS: May 01, 2025 AT 2:30 PM IN OFFICE   Recommended Screenings:  Health Maintenance  Topic Date Due   DTaP/Tdap/Td vaccine (3 - Td or Tdap) 08/27/2024   COVID-19 Vaccine (9 - Moderna risk 2025-26 season) 09/10/2024   Medicare Annual Wellness Visit  04/27/2025   Pneumococcal Vaccine for age over 47  Completed   Flu Shot  Completed   Zoster (Shingles) Vaccine  Completed   Meningitis B Vaccine  Aged Out   Complete foot exam   Discontinued   Hemoglobin A1C  Discontinued   Eye exam for diabetics  Discontinued       04/27/2024   11:42 AM  Advanced Directives  Does Patient Have a Medical Advance Directive? No  Would patient like information on creating a medical advance directive? No - Patient declined    Vision: Annual vision screenings are recommended for early detection of glaucoma, cataracts, and diabetic retinopathy. These exams can also reveal signs of chronic conditions such as diabetes and high blood pressure.  Dental: Annual dental screenings help detect early signs of oral cancer, gum disease, and other conditions linked to overall health, including heart disease and  diabetes.  Please see the attached documents for additional preventive care recommendations.

## 2024-04-27 NOTE — Progress Notes (Signed)
 Chief Complaint  Patient presents with   Medicare Wellness     Subjective:   Jorge Bishop is a 88 y.o. male who presents for a Medicare Annual Wellness Visit.  Allergies (verified) Bactrim [sulfamethoxazole-trimethoprim], Clarithromycin, Codeine, Duloxetine , Erythromycin, Sulfa antibiotics, and Trimethoprim   History: Past Medical History:  Diagnosis Date   Allergy    Arteriosclerotic cardiovascular disease (ASCVD)    Coronary artery bypass graft surgery in 12/98; DES to the RCA SVG in 07/2002; restenosis of the saphenous vein graft stent and renal intervention in 2006; 70% first diagonal present; calf in 11/2007-total obstruction of the saphenous vein graft to the first marginal   Benign prostatic hypertrophy    Cerebrovascular disease    60% bilateral internal carotid artery stenosis in 2012   Coronary artery disease    4 stents   Diabetes mellitus without complication (HCC) 2021   Diaphragmatic hernia 01/15/2021   Diverticulosis    DJD (degenerative joint disease)    Erectile dysfunction    GERD (gastroesophageal reflux disease)    History of blood transfusion    History of kidney stones    Hyperlipidemia    Lipid profile in 12/2009:104, 91, 36, 50   Hypertension    Kidney stone 09/08/2017   Migraine headache    Right bundle branch block    Right ureteral stone 03/08/2019   Shortness of breath    Thrombocytopenia    platelets of 104 in 03/2009   Thyroid  disease 07/06/2020   Upper gastrointestinal hemorrhage 03/2008    Helicobacter pylori positive; subsequent laparoscopic fundoplication-2009   Past Surgical History:  Procedure Laterality Date   CARDIAC CATHETERIZATION     4 cardiac stents   CATARACT EXTRACTION W/PHACO Right 07/25/2012   Procedure: CATARACT EXTRACTION PHACO AND INTRAOCULAR LENS PLACEMENT (IOC);  Surgeon: Cherene Mania, MD;  [facility]: [facility];  Service: Ophthalmology;  Laterality: Right;  CDE:23.31   CATARACT EXTRACTION W/PHACO Left 08/18/2012    Procedure: CATARACT EXTRACTION PHACO AND INTRAOCULAR LENS PLACEMENT (IOC);  Surgeon: Cherene Mania, MD;  Location: [facility];  Service: Ophthalmology;  Laterality: Left;  CDE: 16.07   COLONOSCOPY N/A 08/01/2014   Procedure: COLONOSCOPY;  Surgeon: Claudis RAYMOND Rivet, MD;  [facility];  Service: Endoscopy;  Laterality: N/A;  125 - moved to 10:30 - Ann to notify pt   CORONARY ARTERY BYPASS GRAFT  06/15/1996   CYSTOSCOPY/URETEROSCOPY/HOLMIUM LASER/STENT PLACEMENT Right 11/12/2017   Procedure: CYSTOSCOPY/RIGHT RETROGRADE PYELOGRAM/RIGHT URETEROSCOPY/HOLMIUM LASER LITHOTRIPSY RIGHT URETERAL CALCULUS/RIGHT URETERAL STENT PLACEMENT;  Surgeon: Watt Rush, MD;  Location: [facility];  Service: Urology;  Laterality: Right;  right ureteral calculus given to patient's wife per Dr. Watt   CYSTOSCOPY/URETEROSCOPY/HOLMIUM LASER/STENT PLACEMENT Right 03/07/2019   Procedure: CYSTOSCOPY RIGHT RETROGRADE RIGHT URETEROSCOPY;  Surgeon: Watt Rush, MD;  Location: [facility];  Service: Urology;  Laterality: Right;   EXTRACORPOREAL SHOCK WAVE LITHOTRIPSY Right 09/13/2017   Procedure: RIGHT EXTRACORPOREAL SHOCK WAVE LITHOTRIPSY (ESWL);  Surgeon: Carolee Sherwood JONETTA DOUGLAS, MD;  Location: [facility];  Service: Urology;  Laterality: Right;   HERNIA REPAIR     right inguinal   LAPAROSCOPIC NISSEN FUNDOPLICATION  1997, 2009    revision in 2009 along with repair of paraesophagel hernia    PARAESOPHAGEAL HERNIA REPAIR     SPINE SURGERY  12/2017   TRANSURETHRAL RESECTION OF PROSTATE N/A 03/07/2019   Procedure: TRANSURETHRAL RESECTION OF THE PROSTATE (TURP);  Surgeon: Watt Rush, MD;  Location: [facility];  Service: Urology;  Laterality: N/A;   Family History  Problem Relation Age of Onset   Cancer Mother    CAD Father    Heart disease Father    Social History   Occupational History   Occupation: Full time    Employer: RETIRED  Tobacco Use   Smoking status: Never   Smokeless tobacco: Never  Vaping Use   Vaping status: Never Used   Substance and Sexual Activity   Alcohol use: No    Alcohol/week: 0.0 standard drinks of alcohol   Drug use: No   Sexual activity: Yes    Birth control/protection: None   Tobacco Counseling Counseling given: Yes  SDOH Screenings   Food Insecurity: No Food Insecurity (04/27/2024)  Housing: Low Risk  (04/27/2024)  Transportation Needs: No Transportation Needs (04/27/2024)  Utilities: Not At Risk (04/27/2024)  Alcohol Screen: Low Risk  (02/17/2023)  Depression (PHQ2-9): Low Risk  (04/27/2024)  Financial Resource Strain: Low Risk  (03/15/2024)  Physical Activity: Insufficiently Active (04/27/2024)  Social Connections: Socially Isolated (04/27/2024)  Stress: No Stress Concern Present (04/27/2024)  Tobacco Use: Low Risk  (04/27/2024)  Health Literacy: Adequate Health Literacy (04/27/2024)   See flowsheets for full screening details  Depression Screen PHQ 2 & 9 Depression Scale- Over the past 2 weeks, how often have you been bothered by any of the following problems? Little interest or pleasure in doing things: 0 Feeling down, depressed, or hopeless (PHQ Adolescent also includes...irritable): 0 PHQ-2 Total Score: 0 Trouble falling or staying asleep, or sleeping too much: 0 Feeling tired or having little energy: 0 Poor appetite or overeating (PHQ Adolescent also includes...weight loss): 0 Feeling bad about yourself - or that you are a failure or have let yourself or your family down: 0 Trouble concentrating on things, such as reading the newspaper or watching television (PHQ Adolescent also includes...like school work): 0 Moving or speaking so slowly that other people could have noticed. Or the opposite - being so fidgety or restless that you have been moving around a lot more than usual: 0 Thoughts that you would be better off dead, or of hurting yourself in some way: 0 PHQ-9 Total Score: 0 If you checked off any problems, how difficult have these problems made it for you to do your  work, take care of things at home, or get along with other people?: Not difficult at all  Depression Treatment Depression Interventions/Treatment : Counseling     Goals Addressed               This Visit's Progress     I want to get rid of my neuropathy (pt-stated)         Visit info / Clinical Intake: Medicare Wellness Visit Type:: Subsequent Annual Wellness Visit Persons participating in visit:: patient Medicare Wellness Visit Mode:: In-person (required for WTM) Information given by:: patient Interpreter Needed?: No Pre-visit prep was completed: yes AWV questionnaire completed by patient prior to visit?: no Living arrangements:: lives with spouse/significant other Patient's Overall Health Status Rating: very good Typical amount of pain: none Does pain affect daily life?: no Are you currently prescribed opioids?: no  Dietary Habits and Nutritional Risks How many meals a day?: 3 Eats fruit and vegetables daily?: yes Most meals are obtained by: preparing own meals In the last 2 weeks, have you had any of the following?: none Diabetic:: no  Functional Status Activities of Daily Living (to include ambulation/medication): Independent Ambulation: Independent with device- listed below Home Assistive Devices/Equipment: Cane Medication Administration: Independent Home Management: Independent Manage your own finances?: yes  Primary transportation is: driving Concerns about vision?: no *vision screening is required for WTM* Concerns about hearing?: no  Fall Screening Falls in the past year?: 1 Number of falls in past year: 1 Was there an injury with Fall?: 0 Fall Risk Category Calculator: 2 Patient Fall Risk Level: Moderate Fall Risk  Fall Risk Patient at Risk for Falls Due to: Orthopedic patient; History of fall(s); Impaired balance/gait; Impaired mobility Fall risk Follow up: Falls evaluation completed; Education provided; Falls prevention discussed  Home and  Transportation Safety: All rugs have non-skid backing?: yes All stairs or steps have railings?: yes Grab bars in the bathtub or shower?: yes Have non-skid surface in bathtub or shower?: yes Good home lighting?: yes Regular seat belt use?: yes Hospital stays in the last year:: no  Cognitive Assessment Difficulty concentrating, remembering, or making decisions? : no Will 6CIT or Mini Cog be Completed: no 6CIT or Mini Cog Declined: patient alert, oriented, able to answer questions appropriately and recall recent events  Advance Directives (For Healthcare) Does Patient Have a Medical Advance Directive?: No Type of Advance Directive: Healthcare Power of Alliance; Living will Copy of Healthcare Power of Attorney in Chart?: No - copy requested Copy of Living Will in Chart?: No - copy requested Would patient like information on creating a medical advance directive?: No - Patient declined  Reviewed/Updated  Reviewed/Updated: Reviewed All (Medical, Surgical, Family, Medications, Allergies, Care Teams, Patient Goals)        Objective:    Today's Vitals   04/27/24 1138  BP: 134/74  Pulse: 73  Resp: 14  SpO2: 99%  Weight: 175 lb 1.9 oz (79.4 kg)  Height: 6' (1.829 m)   Body mass index is 23.75 kg/m.  Current Medications (verified) Outpatient Encounter Medications as of 04/27/2024  Medication Sig   aspirin  81 MG tablet Take 81 mg by mouth daily.     atorvastatin  (LIPITOR) 20 MG tablet TAKE 1 TABLET AT BEDTIME   carvedilol  (COREG ) 6.25 MG tablet Take 1 tablet (6.25 mg total) by mouth daily.   lactulose  (CHRONULAC ) 10 GM/15ML solution TAKE 30 ML (20 GRAMS TOTAL) DAILY AS NEEDED FOR CONSTIPATION   levothyroxine  (SYNTHROID ) 50 MCG tablet TAKE 1 TABLET DAILY BEFORE BREAKFAST   Multiple Vitamin (MULTIVITAMIN ADULT PO) Take by mouth.   nitroGLYCERIN  (NITROSTAT ) 0.4 MG SL tablet Place 1 tablet (0.4 mg total) under the tongue every 5 (five) minutes as needed for chest pain.   ondansetron   (ZOFRAN ) 4 MG tablet Take 1 tablet (4 mg total) by mouth every 8 (eight) hours as needed for nausea or vomiting.   promethazine  (PHENERGAN ) 25 MG tablet Take 1 tablet (25 mg total) by mouth every 6 (six) hours as needed for nausea or vomiting.   traMADol  (ULTRAM ) 50 MG tablet Take 1 tablet (50 mg total) by mouth every 12 (twelve) hours as needed for moderate pain (pain score 4-6) or severe pain (pain score 7-10) (pain).   No facility-administered encounter medications on file as of 04/27/2024.   Hearing/Vision screen Hearing Screening - Comments:: Patient denies any hearing difficulties.   Vision Screening - Comments:: Wears rx glasses - up to date with routine eye exams with  [facility] Immunizations and Health Maintenance Health Maintenance  Topic Date Due   COVID-19 Vaccine (8 - 2025-26 season) 02/14/2024   Medicare Annual Wellness (AWV)  02/18/2024   DTaP/Tdap/Td (3 - Td or Tdap) 08/27/2024   Pneumococcal Vaccine: 50+ Years  Completed   Influenza Vaccine  Completed  Zoster Vaccines- Shingrix  Completed   Meningococcal B Vaccine  Aged Out   FOOT EXAM  Discontinued   HEMOGLOBIN A1C  Discontinued   OPHTHALMOLOGY EXAM  Discontinued        Assessment/Plan:  This is a routine wellness examination for Nial.  Patient Care Team: Tobie Suzzane POUR, MD as PCP - General (Internal Medicine) Mallipeddi, Diannah SQUIBB, MD as PCP - Cardiology (Cardiology) Patel, Donika K, DO as Consulting Physician (Neurology)  I have personally reviewed and noted the following in the patient's chart:   Medical and social history Use of alcohol, tobacco or illicit drugs  Current medications and supplements including opioid prescriptions. Functional ability and status Nutritional status Physical activity Advanced directives List of other physicians Hospitalizations, surgeries, and ER visits in previous 12 months Vitals Screenings to include cognitive, depression, and falls Referrals and  appointments  No orders of the defined types were placed in this encounter.  In addition, I have reviewed and discussed with patient certain preventive protocols, quality metrics, and best practice recommendations. A written personalized care plan for preventive services as well as general preventive health recommendations were provided to patient.   Holley Wirt, CMA   04/27/2024   No follow-ups on file.  After Visit Summary: (In Person-Printed) AVS printed and given to the patient  Nurse Notes: N/A

## 2024-05-03 ENCOUNTER — Ambulatory Visit: Attending: Internal Medicine | Admitting: Internal Medicine

## 2024-05-03 ENCOUNTER — Encounter: Payer: Self-pay | Admitting: Internal Medicine

## 2024-05-03 VITALS — BP 136/80 | HR 75 | Ht 72.0 in | Wt 173.6 lb

## 2024-05-03 DIAGNOSIS — Z7902 Long term (current) use of antithrombotics/antiplatelets: Secondary | ICD-10-CM | POA: Insufficient documentation

## 2024-05-03 DIAGNOSIS — I2510 Atherosclerotic heart disease of native coronary artery without angina pectoris: Secondary | ICD-10-CM | POA: Insufficient documentation

## 2024-05-03 DIAGNOSIS — I351 Nonrheumatic aortic (valve) insufficiency: Secondary | ICD-10-CM | POA: Insufficient documentation

## 2024-05-03 MED ORDER — ATORVASTATIN CALCIUM 40 MG PO TABS: 40.0000 mg | ORAL_TABLET | Freq: Every day | ORAL | 3 refills | Status: AC

## 2024-05-03 NOTE — Patient Instructions (Signed)
 Medication Instructions:  Your physician has recommended you make the following change in your medication:   -Increase Atorvastatin  to 40 mg once daily    *If you need a refill on your cardiac medications before your next appointment, please call your pharmacy*  Lab Work: None If you have labs (blood work) drawn today and your tests are completely normal, you will receive your results only by: MyChart Message (if you have MyChart) OR A paper copy in the mail If you have any lab test that is abnormal or we need to change your treatment, we will call you to review the results.  Testing/Procedures: None  Follow-Up: At [facility], you and your health needs are our priority.  As part of our continuing mission to provide you with exceptional heart care, our providers are all part of one team.  This team includes your primary Cardiologist (physician) and Advanced Practice Providers or APPs (Physician Assistants and Nurse Practitioners) who all work together to provide you with the care you need, when you need it.  Your next appointment:   1.5 year(s)  Provider:   You may see Vishnu P Mallipeddi, MD or one of the following Advanced Practice Providers on your designated Care Team:   Brittany Strader, PA-C  Scotesia Marcellus, [place] Olivia Pavy, [place]     We recommend signing up for the patient portal called MyChart.  Sign up information is provided on this After Visit Summary.  MyChart is used to connect with patients for Virtual Visits (Telemedicine).  Patients are able to view lab/test results, encounter notes, upcoming appointments, etc.  Non-urgent messages can be sent to your provider as well.   To learn more about what you can do with MyChart, go to [email or web address].   Other Instructions

## 2024-05-03 NOTE — Progress Notes (Signed)
 Cardiology Office Note  Date: 05/03/2024   ID: Jorge Bishop, DOB [birth year], MRN [id number]  PCP:  Tobie Suzzane POUR, MD  Cardiologist:  Diannah SHAUNNA Maywood, MD Electrophysiologist:  None   Reason for Office Visit: F/u of CAD   History of Present Illness: Jorge Bishop is a 88 y.o. male known to have CAD s/p CABG in 1998, s/p DES to SVG-RCA, with restenosis in 2009 on medical management with normal LVEF, HTN, HLD, MGUS, mild carotid artery stenosis (less than 30%), mild AI is here for follow-up visit.  Echo in July 2024 showed normal LVEF, normal RV function, severe dilatation of LA, trivial MR, mild AI.  Stable from 2019 echocardiogram.  Patient is here today for follow-up visit.  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  Had a fall 4 months ago.  No dizziness or syncope.  He had inner ear issues resulting in vertigo.  No angina or DOE.  Past Medical History:  Diagnosis Date   Allergy    Arteriosclerotic cardiovascular disease (ASCVD)    Coronary artery bypass graft surgery in 12/98; DES to the RCA SVG in 07/2002; restenosis of the saphenous vein graft stent and renal intervention in 2006; 70% first diagonal present; calf in 11/2007-total obstruction of the saphenous vein graft to the first marginal   Benign prostatic hypertrophy    Cerebrovascular disease    60% bilateral internal carotid artery stenosis in 2012   Coronary artery disease    4 stents   Diabetes mellitus without complication (HCC) 2021   Diaphragmatic hernia 01/15/2021   Diverticulosis    DJD (degenerative joint disease)    Erectile dysfunction    GERD (gastroesophageal reflux disease)    History of blood transfusion    History of kidney stones    Hyperlipidemia    Lipid profile in 12/2009:104, 91, 36, 50   Hypertension    Kidney stone 09/08/2017   Migraine headache    Right bundle branch block    Right ureteral stone 03/08/2019   Shortness of breath    Thrombocytopenia    platelets of 104 in  03/2009   Thyroid  disease 07/06/2020   Upper gastrointestinal hemorrhage 03/2008    Helicobacter pylori positive; subsequent laparoscopic fundoplication-2009    Past Surgical History:  Procedure Laterality Date   CARDIAC CATHETERIZATION     4 cardiac stents   CATARACT EXTRACTION W/PHACO Right 07/25/2012   Procedure: CATARACT EXTRACTION PHACO AND INTRAOCULAR LENS PLACEMENT (IOC);  Surgeon: Cherene Mania, MD;  Location: [facility];  Service: Ophthalmology;  Laterality: Right;  CDE:23.31   CATARACT EXTRACTION W/PHACO Left 08/18/2012   Procedure: CATARACT EXTRACTION PHACO AND INTRAOCULAR LENS PLACEMENT (IOC);  Surgeon: Cherene Mania, MD;  Location: [facility];  Service: Ophthalmology;  Laterality: Left;  CDE: 16.07   COLONOSCOPY N/A 08/01/2014   Procedure: COLONOSCOPY;  Surgeon: Claudis RAYMOND Rivet, MD;  [facility];  Service: Endoscopy;  Laterality: N/A;  125 - moved to 10:30 - Ann to notify pt   CORONARY ARTERY BYPASS GRAFT  06/15/1996   CYSTOSCOPY/URETEROSCOPY/HOLMIUM LASER/STENT PLACEMENT Right 11/12/2017   Procedure: CYSTOSCOPY/RIGHT RETROGRADE PYELOGRAM/RIGHT URETEROSCOPY/HOLMIUM LASER LITHOTRIPSY RIGHT URETERAL CALCULUS/RIGHT URETERAL STENT PLACEMENT;  Surgeon: Watt Rush, MD;  Location: [facility];  Service: Urology;  Laterality: Right;  right ureteral calculus given to patient's wife per Dr. Watt   CYSTOSCOPY/URETEROSCOPY/HOLMIUM LASER/STENT PLACEMENT Right 03/07/2019   Procedure: CYSTOSCOPY RIGHT RETROGRADE RIGHT URETEROSCOPY;  Surgeon: Watt Rush, MD;  [facility];  Service: Urology;  Laterality:  Right;   EXTRACORPOREAL SHOCK WAVE LITHOTRIPSY Right 09/13/2017   Procedure: RIGHT EXTRACORPOREAL SHOCK WAVE LITHOTRIPSY (ESWL);  Surgeon: Carolee Sherwood JONETTA DOUGLAS, MD;  Location: [facility];  Service: Urology;  Laterality: Right;   HERNIA REPAIR     right inguinal   LAPAROSCOPIC NISSEN FUNDOPLICATION  1997, 2009    revision in 2009 along with repair of paraesophagel hernia    PARAESOPHAGEAL HERNIA  REPAIR     SPINE SURGERY  12/2017   TRANSURETHRAL RESECTION OF PROSTATE N/A 03/07/2019   Procedure: TRANSURETHRAL RESECTION OF THE PROSTATE (TURP);  Surgeon: Watt Rush, MD;  [facility]: [facility];  Service: Urology;  Laterality: N/A;    Current Outpatient Medications  Medication Sig Dispense Refill   aspirin  81 MG tablet Take 81 mg by mouth daily.       atorvastatin  (LIPITOR) 20 MG tablet TAKE 1 TABLET AT BEDTIME 90 tablet 3   carvedilol  (COREG ) 6.25 MG tablet Take 1 tablet (6.25 mg total) by mouth daily. 90 tablet 3   lactulose  (CHRONULAC ) 10 GM/15ML solution TAKE 30 ML (20 GRAMS TOTAL) DAILY AS NEEDED FOR CONSTIPATION 450 mL 11   levothyroxine  (SYNTHROID ) 50 MCG tablet TAKE 1 TABLET DAILY BEFORE BREAKFAST 90 tablet 3   Multiple Vitamin (MULTIVITAMIN ADULT PO) Take by mouth.     nitroGLYCERIN  (NITROSTAT ) 0.4 MG SL tablet Place 1 tablet (0.4 mg total) under the tongue every 5 (five) minutes as needed for chest pain. 10 tablet 1   ondansetron  (ZOFRAN ) 4 MG tablet Take 1 tablet (4 mg total) by mouth every 8 (eight) hours as needed for nausea or vomiting. 30 tablet 0   promethazine  (PHENERGAN ) 25 MG tablet Take 1 tablet (25 mg total) by mouth every 6 (six) hours as needed for nausea or vomiting. 30 tablet 0   traMADol  (ULTRAM ) 50 MG tablet Take 1 tablet (50 mg total) by mouth every 12 (twelve) hours as needed for moderate pain (pain score 4-6) or severe pain (pain score 7-10) (pain). 30 tablet 0   No current facility-administered medications for this visit.   Allergies:  Bactrim [sulfamethoxazole-trimethoprim], Clarithromycin, Codeine, Duloxetine , Erythromycin, Sulfa antibiotics, and Trimethoprim   Social History: The patient  reports that he has never smoked. He has never used smokeless tobacco. He reports that he does not drink alcohol and does not use drugs.   Family History: The patient's family history includes CAD in his father; Cancer in his mother; Heart disease in his father.   ROS:   Please see the history of present illness. Otherwise, complete review of systems is positive for none.  All other systems are reviewed and negative.   Physical Exam: VS:  Ht 6' (1.829 m)   Wt 173 lb 9.6 oz (78.7 kg)   BMI 23.54 kg/m , BMI Body mass index is 23.54 kg/m.  Wt Readings from Last 3 Encounters:  05/03/24 173 lb 9.6 oz (78.7 kg)  04/27/24 175 lb 1.9 oz (79.4 kg)  03/22/24 172 lb 12.8 oz (78.4 kg)    General: Patient appears comfortable at rest. HEENT: Conjunctiva and lids normal, oropharynx clear with moist mucosa. Neck: Supple, no elevated JVP or carotid bruits, no thyromegaly. Lungs: Clear to auscultation, nonlabored breathing at rest. Cardiac: Regular rate and rhythm, no S3 or significant systolic murmur, no pericardial rub. Abdomen: Soft, nontender, no hepatomegaly, bowel sounds present, no guarding or rebound. Extremities: No pitting edema, distal pulses 2+. Skin: Warm and dry. Musculoskeletal: No kyphosis. Neuropsychiatric: Alert and oriented x3, affect grossly appropriate.  Recent  Labwork: 09/20/2023: TSH 2.070 01/26/2024: ALT 36; AST 32; BUN 20; Creatinine, Ser 0.85; Hemoglobin 16.0; Platelets 126; Potassium 4.8; Sodium 140     Component Value Date/Time   CHOL 142 09/20/2023 1420   TRIG 105 09/20/2023 1420   HDL 33 (L) 09/20/2023 1420   CHOLHDL 4.3 09/20/2023 1420   CHOLHDL 3.5 03/02/2019 1039   VLDL 14 03/02/2019 1039   LDLCALC 89 09/20/2023 1420     Assessment and Plan:  # CAD s/p CABG in 1998, s/p DES to SVG-RCA with restenosis in 2009 on medical management and normal LVEF, currently angina free - Continue aspirin  81 mg once daily, increase atorvastatin  from 20 mg to 40 mg nightly. - ER precautions for chest pain provided.  # HLD, not at goal - Lipid panel from April 2025 reviewed.  LDL 89, not at goal.  TG 105, within normal limits.  Goal LDL less than 70. - Increase atorvastatin  from 20 mg to 40 mg nightly.  He was previously on Zetia  and that was  discontinued per his request.  # Mild AI in 2019 - Repeat echocardiogram in July 2024 showed normal LVEF, normal RV function, mild AR (stable since 2019).  No indication to repeat echocardiograms unless he is symptomatic (due to advanced age).  # Mild CAS - <39% stenosis bilaterally in the carotids in 2022.  Continue cardioprotective medications, as above.  # HTN, controlled - Continue carvedilol  6.25 mg once daily (supposed to take it twice daily but will keep it at the current frequency as he is tolerating well with no side effects).   I have spent a total of 30 minutes reviewing prior medical records, more than 3 labs, discussion and documentation.  Medication Adjustments/Labs and Tests Ordered: Current medicines are reviewed at length with the patient today.  Concerns regarding medicines are outlined above.   Tests Ordered: No orders of the defined types were placed in this encounter.    Medication Changes: No orders of the defined types were placed in this encounter.   Disposition:  Follow up 1.5 year  Signed, Novella Abraha Arleta Maywood, MD, 05/03/2024 10:39 AM    [facility] [address] [place], [place] [address]

## 2024-07-07 ENCOUNTER — Other Ambulatory Visit: Payer: Self-pay

## 2024-07-07 ENCOUNTER — Encounter (HOSPITAL_COMMUNITY): Payer: Self-pay | Admitting: Emergency Medicine

## 2024-07-07 ENCOUNTER — Emergency Department (HOSPITAL_COMMUNITY)
Admission: EM | Admit: 2024-07-07 | Discharge: 2024-07-07 | Disposition: A | Discharge status: Home / Self Care | Attending: Emergency Medicine | Admitting: Emergency Medicine

## 2024-07-07 DIAGNOSIS — R310 Gross hematuria: Secondary | ICD-10-CM | POA: Insufficient documentation

## 2024-07-07 DIAGNOSIS — Z7982 Long term (current) use of aspirin: Secondary | ICD-10-CM | POA: Insufficient documentation

## 2024-07-07 DIAGNOSIS — R1031 Right lower quadrant pain: Secondary | ICD-10-CM | POA: Diagnosis not present

## 2024-07-07 DIAGNOSIS — R319 Hematuria, unspecified: Secondary | ICD-10-CM | POA: Diagnosis present

## 2024-07-07 LAB — URINALYSIS, ROUTINE W REFLEX MICROSCOPIC
Bacteria, UA: NONE SEEN
Bilirubin Urine: NEGATIVE
Glucose, UA: NEGATIVE mg/dL
Ketones, ur: NEGATIVE mg/dL
Leukocytes,Ua: NEGATIVE
Nitrite: NEGATIVE
Protein, ur: NEGATIVE mg/dL
RBC / HPF: >50 RBC/hpf (ref 0–5)
Specific Gravity, Urine: 1.012 (ref 1.005–1.030)
pH: 7 (ref 5.0–8.0)

## 2024-07-07 NOTE — ED Triage Notes (Signed)
 Pt states he went to bathroom and passed a blood clot. Pt with hx of TURP.

## 2024-07-07 NOTE — ED Provider Notes (Signed)
 " [place] [facility] AT [facility] Provider Note   CSN: [id number] Arrival date & time: 07/07/24  0430     Patient presents with: Hematuria   Jorge Bishop is a 89 y.o. male.   The history is provided by the patient.   Patient presents for hematuria. Patient reports yesterday around dinnertime he urinated and had a blood clot.  He has had mild pain in his right lower quadrant Since that time he has urinated multiple times and has noticed blood in the urine but it is clearing No fevers or vomiting.  He reports he is able to fully evacuate his bladder No flank pain. He has a previous history of a TURP and previous history of kidney stones He is not on anticoagulation    Prior to Admission medications  Medication Sig Start Date End Date Taking? Authorizing Provider  aspirin  81 MG tablet Take 81 mg by mouth daily.      [provider]  atorvastatin  (LIPITOR) 40 MG tablet Take 1 tablet (40 mg total) by mouth daily. 05/03/24 08/01/24  Mallipeddi, Vishnu P, MD  carvedilol  (COREG ) 6.25 MG tablet Take 1 tablet (6.25 mg total) by mouth daily. 08/13/23   Mallipeddi, Vishnu P, MD  lactulose  (CHRONULAC ) 10 GM/15ML solution TAKE 30 ML (20 GRAMS TOTAL) DAILY AS NEEDED FOR CONSTIPATION 04/20/24   Tobie Suzzane POUR, MD  levothyroxine  (SYNTHROID ) 50 MCG tablet TAKE 1 TABLET DAILY BEFORE BREAKFAST 08/13/23   Patel, Rutwik K, MD  Multiple Vitamin (MULTIVITAMIN ADULT PO) Take by mouth.    [provider]  nitroGLYCERIN  (NITROSTAT ) 0.4 MG SL tablet Place 1 tablet (0.4 mg total) under the tongue every 5 (five) minutes as needed for chest pain. 03/22/24   Tobie Suzzane POUR, MD  ondansetron  (ZOFRAN ) 4 MG tablet Take 1 tablet (4 mg total) by mouth every 8 (eight) hours as needed for nausea or vomiting. 03/22/24   Tobie Suzzane POUR, MD  promethazine  (PHENERGAN ) 25 MG tablet Take 1 tablet (25 mg total) by mouth every 6 (six) hours as needed for nausea or vomiting. 03/22/24   Tobie Suzzane POUR, MD  traMADol  (ULTRAM ) 50 MG tablet Take 1 tablet (50 mg total) by mouth every 12 (twelve) hours as needed for moderate pain (pain score 4-6) or severe pain (pain score 7-10) (pain). 03/22/24   Tobie Suzzane POUR, MD    Allergies: Bactrim [sulfamethoxazole-trimethoprim], Clarithromycin, Codeine, Duloxetine , Erythromycin, Sulfa antibiotics, and Trimethoprim    Review of Systems  Constitutional:  Negative for fever.  Gastrointestinal:  Negative for vomiting.  Genitourinary:  Positive for hematuria. Negative for flank pain.    Updated Vital Signs BP (!) 167/67   Pulse 69   Temp 97.9 F (36.6 C) (Oral)   Resp 18   Ht 1.829 m (6')   Wt 64.4 kg   SpO2 99%   BMI 19.26 kg/m   Physical Exam CONSTITUTIONAL: Elderly, well-appearing HEAD: Normocephalic/atraumatic ABDOMEN: soft, mild RLQ tenderness, no rebound or guarding, bowel sounds noted throughout abdomen GU:no cva tenderness Exam performed with chaperone present, no large hernia, no testicular tenderness, no overlying erythema NEURO: Pt is awake/alert/appropriate, moves all extremitiesx4.  No facial droop.   SKIN: warm, color normal PSYCH: no abnormalities of mood noted, alert and oriented to situation  (all labs ordered are listed, but only abnormal results are displayed) Labs Reviewed  URINALYSIS, ROUTINE W REFLEX MICROSCOPIC - Abnormal; Notable for the following components:      Result Value   Hgb urine  dipstick LARGE (*)    All other components within normal limits    EKG: None  Radiology: No results found.   Procedures   Medications Ordered in the ED - No data to display                                  Medical Decision Making Amount and/or Complexity of Data Reviewed Labs: ordered.   Patient abrupt onset of hematuria that is improving No signs of UTI He is overall well appearing Pt has established h/o kidney stones He has only mild RLQ pain but is well appearing  and declines pain meds Will defer  further workup for now. Will refer to urology Discussed strict return precautions He has tramadol  at home for pain      Final diagnoses:  Gross hematuria    ED Discharge Orders     None          Midge Golas, MD 07/07/24 [phone]  "

## 2024-07-13 ENCOUNTER — Telehealth: Payer: Self-pay | Admitting: Urology

## 2024-07-13 NOTE — Telephone Encounter (Signed)
 Patient seen in ER for gross hematuria, he is in pain and wants to be seen asap. [phone]

## 2024-07-14 NOTE — Telephone Encounter (Addendum)
 Return call to pt.will call pt back with appointment

## 2024-07-21 ENCOUNTER — Ambulatory Visit: Admitting: Orthopedic Surgery

## 2024-07-21 ENCOUNTER — Encounter: Payer: Self-pay | Admitting: Orthopedic Surgery

## 2024-07-21 NOTE — Progress Notes (Unsigned)
 Return Patient Visit  Assessment: Jorge Bishop is a 89 y.o. male with the following: Bilateral knee arthritis  Plan: Jorge Bishop has arthritis in both knees.  Injections have provided relief for 7-8 weeks.  He would like to proceed with repeat injections today.  These were completed without issues.  Procedure note injection Right knee joint   Verbal consent was obtained to inject the right knee joint  Timeout was completed to confirm the site of injection.  The skin was prepped with alcohol and ethyl chloride was sprayed at the injection site.  A 21-gauge needle was used to inject 40 mg of Depo-Medrol  and 1% lidocaine  (4 cc) into the right knee using an anterolateral approach.  There were no complications. A sterile bandage was applied.   Procedure note injection Left knee joint   Verbal consent was obtained to inject the left knee joint  Timeout was completed to confirm the site of injection.  The skin was prepped with alcohol and ethyl chloride was sprayed at the injection site.  A 21-gauge needle was used to inject 40 mg of Depo-Medrol  and 1% lidocaine  (4 cc) into the left knee using an anterolateral approach.  There were no complications. A sterile bandage was applied.      Follow-up: No follow-ups on file.  Subjective:  Chief Complaint  Patient presents with   Knee Pain    Bilateral- last ones lasted 1 1/2 months    History of Present Illness: Jorge Bishop is a 89 y.o. male who returns to clinic for evaluation of bilateral knee pain.  He has had multiple injections in the past.  He states the most recent injections are lasting about 7-8 weeks.  He is not interested in surgery.  Review of Systems: No fevers or chills No numbness or tingling No chest pain No shortness of breath No bowel or bladder dysfunction No GI distress No headaches   Objective: There were no vitals taken for this visit.  Physical Exam:  General: Elderly male., Alert and oriented.,  and No acute distress. Gait: Slow, steady gait.  Ambulating with the assistance of a cane.  Bilateral knees without swelling.  He has good range of motion.  No increased laxity to varus or valgus stress.  Tenderness palpation along the medial joint line.  Negative Lachman bilaterally.  IMAGING: No new imaging obtained today     New Medications:  No orders of the defined types were placed in this encounter.     Jorge DELENA Horde, MD  07/21/2024 12:13 PM

## 2024-07-21 NOTE — Patient Instructions (Signed)

## 2024-07-25 ENCOUNTER — Ambulatory Visit: Admitting: Orthopedic Surgery

## 2024-08-02 ENCOUNTER — Inpatient Hospital Stay

## 2024-08-09 ENCOUNTER — Ambulatory Visit: Admitting: Physician Assistant

## 2024-08-28 ENCOUNTER — Inpatient Hospital Stay: Admitting: Physician Assistant

## 2024-09-20 ENCOUNTER — Ambulatory Visit: Admitting: Internal Medicine

## 2024-10-20 ENCOUNTER — Ambulatory Visit: Admitting: Orthopedic Surgery

## 2025-05-01 ENCOUNTER — Ambulatory Visit
